# Patient Record
Sex: Female | Born: 1975
Health system: Southern US, Community
[De-identification: ages and names within clinical notes are randomized; demographics above are authoritative.]

## PROBLEM LIST (undated history)

## (undated) DIAGNOSIS — N760 Acute vaginitis: Secondary | ICD-10-CM

## (undated) DIAGNOSIS — I639 Cerebral infarction, unspecified: Secondary | ICD-10-CM

## (undated) DIAGNOSIS — G43909 Migraine, unspecified, not intractable, without status migrainosus: Secondary | ICD-10-CM

## (undated) DIAGNOSIS — F419 Anxiety disorder, unspecified: Secondary | ICD-10-CM

## (undated) DIAGNOSIS — N939 Abnormal uterine and vaginal bleeding, unspecified: Secondary | ICD-10-CM

## (undated) DIAGNOSIS — E785 Hyperlipidemia, unspecified: Secondary | ICD-10-CM

## (undated) DIAGNOSIS — F32A Depression, unspecified: Secondary | ICD-10-CM

## (undated) DIAGNOSIS — B9689 Other specified bacterial agents as the cause of diseases classified elsewhere: Secondary | ICD-10-CM

## (undated) DIAGNOSIS — D649 Anemia, unspecified: Secondary | ICD-10-CM

## (undated) DIAGNOSIS — F909 Attention-deficit hyperactivity disorder, unspecified type: Secondary | ICD-10-CM

## (undated) DIAGNOSIS — R112 Nausea with vomiting, unspecified: Secondary | ICD-10-CM

## (undated) DIAGNOSIS — N809 Endometriosis, unspecified: Secondary | ICD-10-CM

## (undated) DIAGNOSIS — E282 Polycystic ovarian syndrome: Secondary | ICD-10-CM

## (undated) DIAGNOSIS — T8859XA Other complications of anesthesia, initial encounter: Secondary | ICD-10-CM

## (undated) DIAGNOSIS — Z9889 Other specified postprocedural states: Secondary | ICD-10-CM

## (undated) HISTORY — DX: Depression, unspecified: F32.A

## (undated) HISTORY — DX: Other specified bacterial agents as the cause of diseases classified elsewhere: N76.0

## (undated) HISTORY — DX: Other specified bacterial agents as the cause of diseases classified elsewhere: B96.89

## (undated) HISTORY — DX: Migraine, unspecified, not intractable, without status migrainosus: G43.909

## (undated) HISTORY — DX: Polycystic ovarian syndrome: E28.2

## (undated) HISTORY — DX: Abnormal uterine and vaginal bleeding, unspecified: N93.9

## (undated) HISTORY — DX: Anemia, unspecified: D64.9

## (undated) HISTORY — PX: VAGINAL HYSTERECTOMY: SUR661

## (undated) HISTORY — DX: Endometriosis, unspecified: N80.9

## (undated) HISTORY — DX: Hyperlipidemia, unspecified: E78.5

## (undated) HISTORY — DX: Anxiety disorder, unspecified: F41.9

## (undated) HISTORY — DX: Cerebral infarction, unspecified: I63.9

## (undated) HISTORY — DX: Attention-deficit hyperactivity disorder, unspecified type: F90.9

---
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
## 2012-02-26 ENCOUNTER — Ambulatory Visit: Payer: Self-pay

## 2012-02-26 IMAGING — MG MMM DGT SCR NO ORDER W/CAD
1 series · 4 of 4 positions shown · non-contrast
Comparison: none

REASON FOR EXAM: SCR MAMMO BASELINE NO ORDER
COMMENTS:

[Series 6802: R CC · right · 4 of 4 slices shown]
[im 1/4]
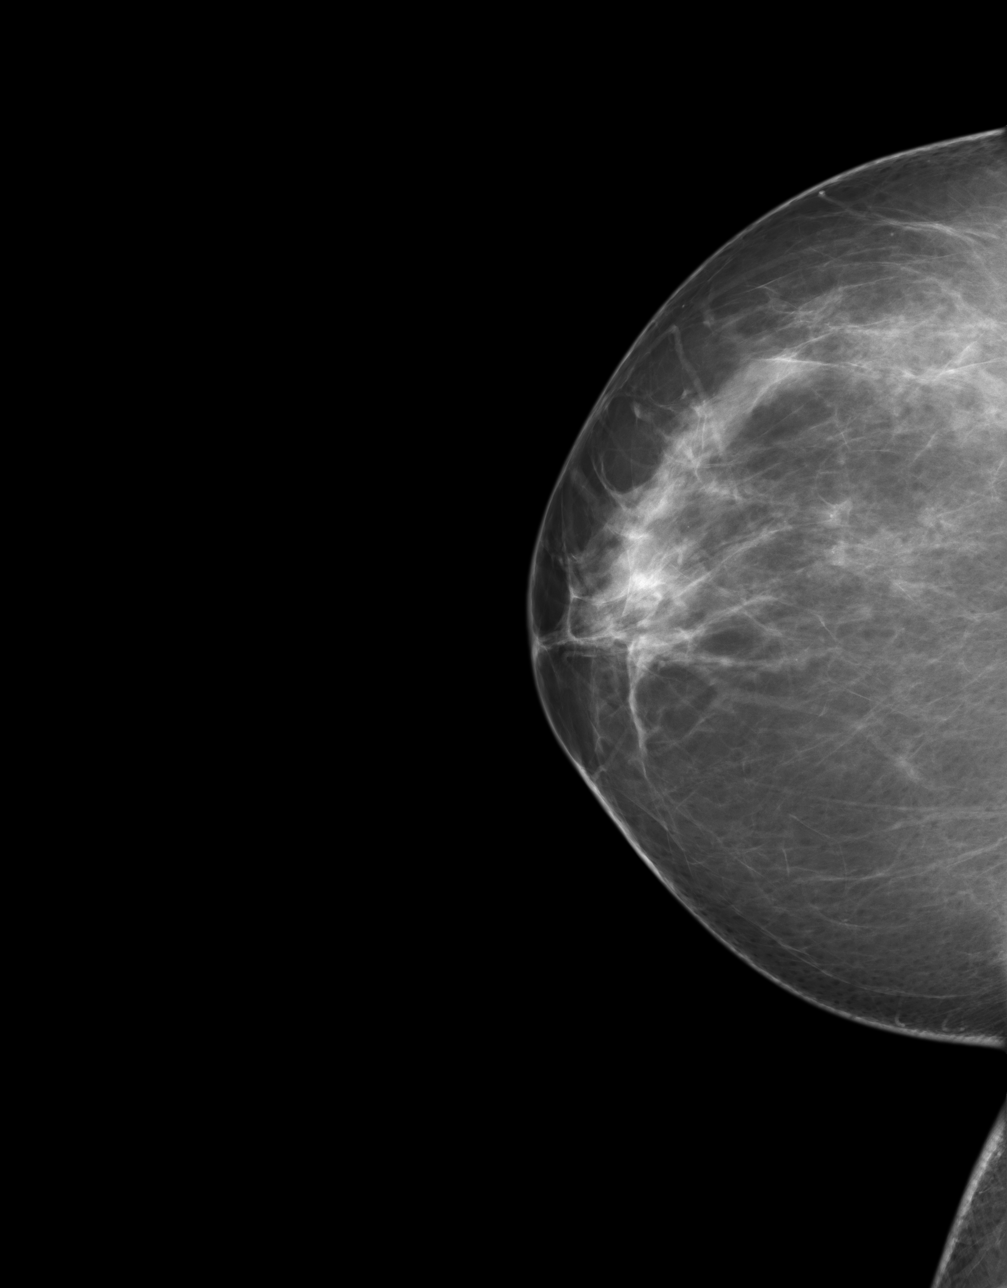
[im 2/4]
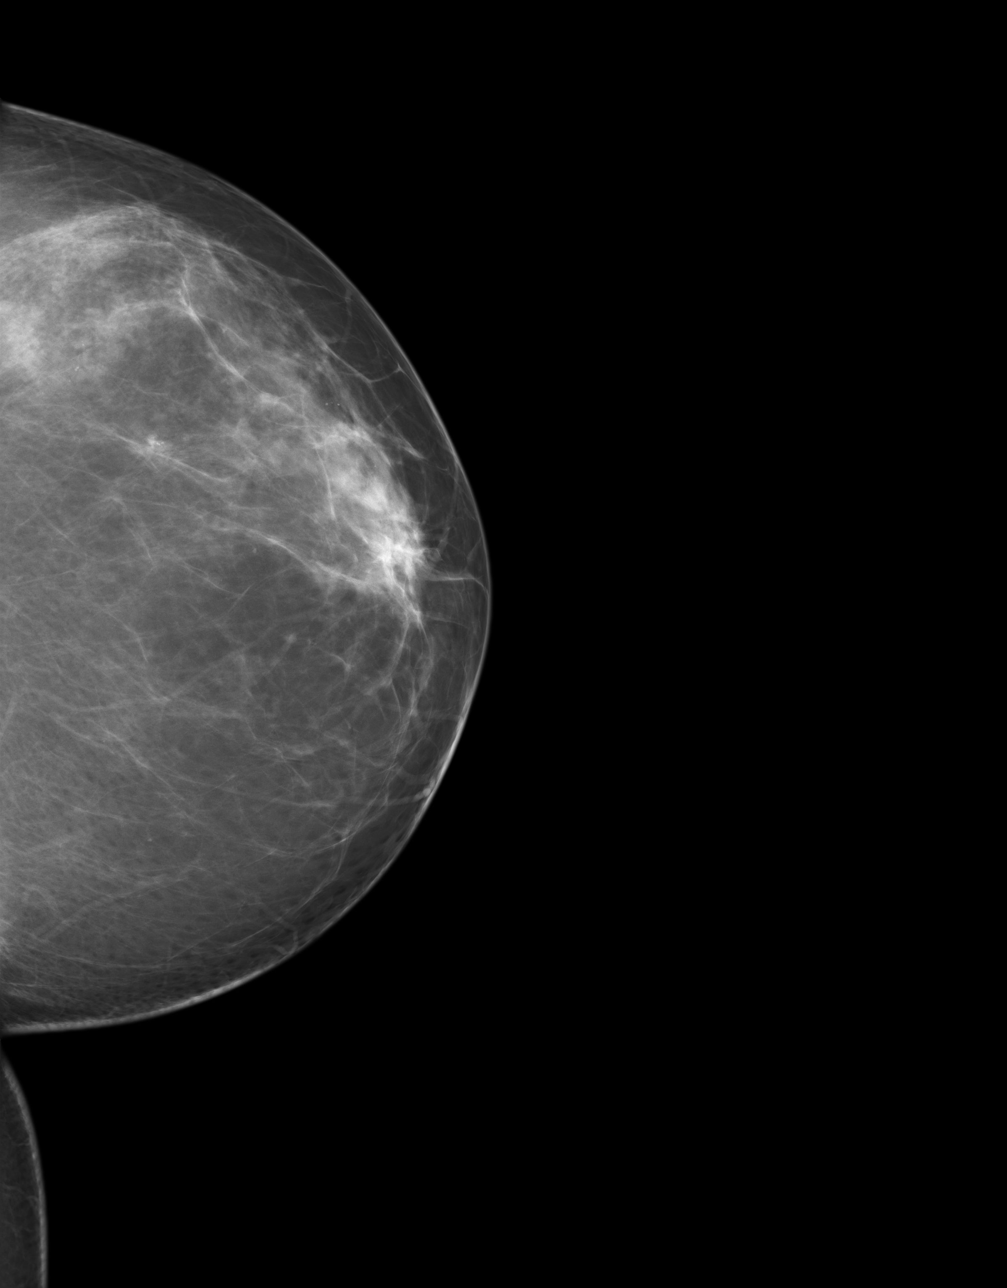
[im 3/4]
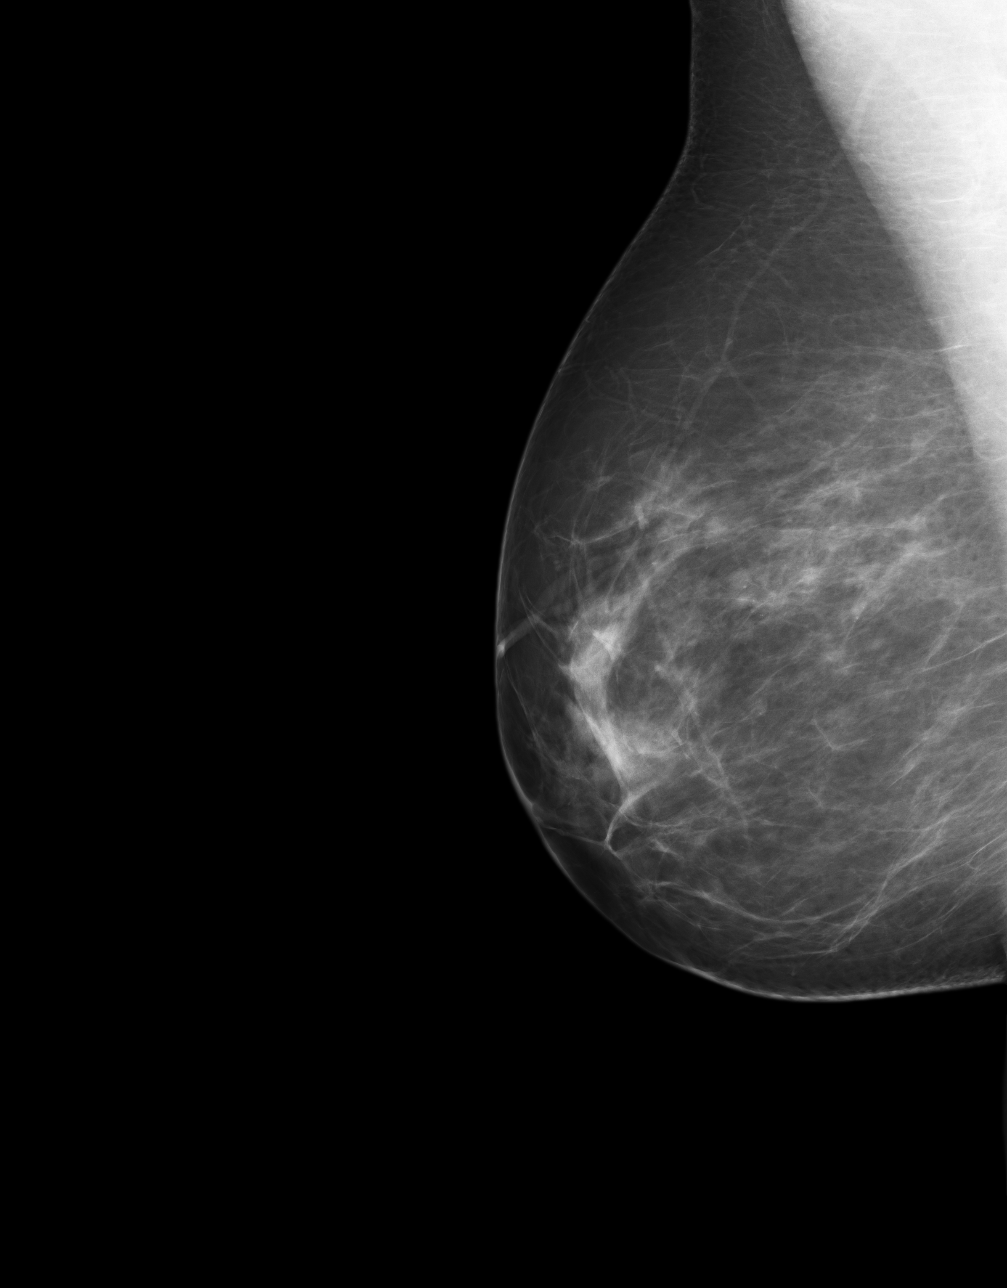
[im 4/4]
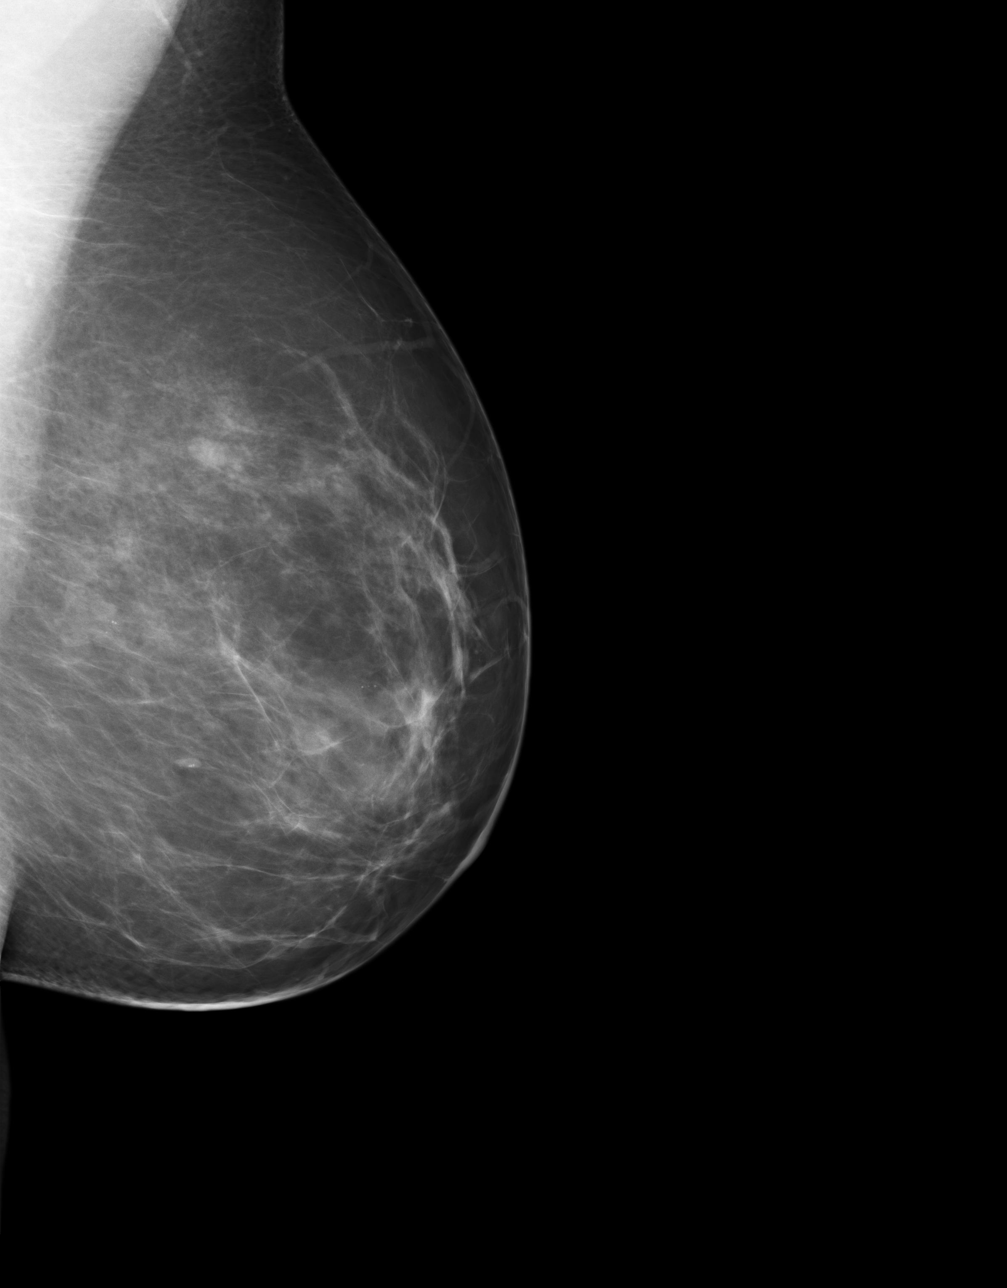

[4 of 4 positions shown; findings below may reference images not displayed]

PROCEDURE:     MMM - MMM DGT SCR NO ORDER W/CAD  - [DATE]  [DATE]

RESULT:     There are no prior mammograms for comparison. There are
scattered fibroglandular elements bilaterally. No dominant mass or malignant
appearing microcalcifications are seen. A few benign-appearing
calcifications are noted bilaterally.
IMPRESSION: 1. Bilaterally benign-appearing baseline mammogram.

BI-RADS: Category 2 - Benign Finding.

A NEGATIVE MAMMOGRAM REPORT DOES NOT PRECLUDE BIOPSY OR OTHER EVALUATION OF
A CLINICALLY PALPABLE OR OTHERWISE SUSPICIOUS MASS OR LESION. BREAST CANCER
MAY NOT BE DETECTED BY MAMMOGRAPHY IN UP TO 10% OF CASES.

## 2013-10-26 HISTORY — PX: SALPINGECTOMY: SHX328

## 2014-07-24 LAB — HM PAP SMEAR: HM PAP: NEGATIVE

## 2014-07-30 ENCOUNTER — Emergency Department: Payer: Self-pay | Admitting: Emergency Medicine

## 2014-07-30 LAB — BASIC METABOLIC PANEL
Anion Gap: 5 — ABNORMAL LOW (ref 7–16)
BUN: 9 mg/dL (ref 7–18)
Calcium, Total: 8.9 mg/dL (ref 8.5–10.1)
Chloride: 106 mmol/L (ref 98–107)
Co2: 27 mmol/L (ref 21–32)
Creatinine: 0.66 mg/dL (ref 0.60–1.30)
EGFR (Non-African Amer.): >60
Glucose: 90 mg/dL (ref 65–99)
Osmolality: 274 (ref 275–301)
POTASSIUM: 4.1 mmol/L (ref 3.5–5.1)
Sodium: 138 mmol/L (ref 136–145)

## 2014-07-30 LAB — CBC WITH DIFFERENTIAL/PLATELET
Basophil #: 0.1 10*3/uL (ref 0.0–0.1)
Basophil %: 0.9 %
Eosinophil #: 0.4 10*3/uL (ref 0.0–0.7)
Eosinophil %: 3.9 %
HCT: 29.7 % — ABNORMAL LOW (ref 35.0–47.0)
HGB: 9.1 g/dL — ABNORMAL LOW (ref 12.0–16.0)
LYMPHS PCT: 14.8 %
Lymphocyte #: 1.5 10*3/uL (ref 1.0–3.6)
MCH: 21.2 pg — ABNORMAL LOW (ref 26.0–34.0)
MCHC: 30.7 g/dL — ABNORMAL LOW (ref 32.0–36.0)
MCV: 69 fL — ABNORMAL LOW (ref 80–100)
Monocyte #: 0.6 x10 3/mm (ref 0.2–0.9)
Monocyte %: 5.4 %
NEUTROS ABS: 7.6 10*3/uL — AB (ref 1.4–6.5)
NEUTROS PCT: 75 %
Platelet: 357 10*3/uL (ref 150–440)
RBC: 4.29 10*6/uL (ref 3.80–5.20)
RDW: 18.8 % — ABNORMAL HIGH (ref 11.5–14.5)
WBC: 10.1 10*3/uL (ref 3.6–11.0)

## 2014-07-30 LAB — URINALYSIS, COMPLETE
BILIRUBIN, UR: NEGATIVE
GLUCOSE, UR: NEGATIVE mg/dL (ref 0–75)
Ketone: NEGATIVE
LEUKOCYTE ESTERASE: NEGATIVE
Nitrite: NEGATIVE
PH: 6 (ref 4.5–8.0)
Protein: NEGATIVE
RBC,UR: 37 /HPF (ref 0–5)
SPECIFIC GRAVITY: 1.002 (ref 1.003–1.030)
WBC UR: 2 /HPF (ref 0–5)

## 2014-08-31 ENCOUNTER — Emergency Department: Payer: Self-pay | Admitting: Emergency Medicine

## 2014-08-31 LAB — BASIC METABOLIC PANEL
ANION GAP: 8 (ref 7–16)
BUN: 6 mg/dL — AB (ref 7–18)
Calcium, Total: 8.4 mg/dL — ABNORMAL LOW (ref 8.5–10.1)
Chloride: 105 mmol/L (ref 98–107)
Co2: 24 mmol/L (ref 21–32)
Creatinine: 0.9 mg/dL (ref 0.60–1.30)
EGFR (African American): >60
GLUCOSE: 146 mg/dL — AB (ref 65–99)
OSMOLALITY: 274 (ref 275–301)
Potassium: 3.4 mmol/L — ABNORMAL LOW (ref 3.5–5.1)
Sodium: 137 mmol/L (ref 136–145)

## 2014-08-31 LAB — URINALYSIS, COMPLETE
Hyaline Cast: 2
RBC,UR: 8 /HPF (ref 0–5)
Specific Gravity: 1.015 (ref 1.003–1.030)

## 2014-08-31 LAB — CBC WITH DIFFERENTIAL/PLATELET
BASOS PCT: 0.5 %
Basophil #: 0 10*3/uL (ref 0.0–0.1)
Eosinophil #: 0.1 10*3/uL (ref 0.0–0.7)
Eosinophil %: 1.2 %
HCT: 37.5 % (ref 35.0–47.0)
HGB: 12.1 g/dL (ref 12.0–16.0)
LYMPHS ABS: 1.5 10*3/uL (ref 1.0–3.6)
LYMPHS PCT: 15.4 %
MCH: 27.2 pg (ref 26.0–34.0)
MCHC: 32.3 g/dL (ref 32.0–36.0)
MCV: 84 fL (ref 80–100)
Monocyte #: 0.5 x10 3/mm (ref 0.2–0.9)
Monocyte %: 5.5 %
NEUTROS PCT: 77.4 %
Neutrophil #: 7.3 10*3/uL — ABNORMAL HIGH (ref 1.4–6.5)
PLATELETS: 281 10*3/uL (ref 150–440)
RBC: 4.45 10*6/uL (ref 3.80–5.20)
RDW: 29.6 % — ABNORMAL HIGH (ref 11.5–14.5)
WBC: 9.4 10*3/uL (ref 3.6–11.0)

## 2014-08-31 LAB — D-DIMER(ARMC): D-Dimer: 178 ng/ml

## 2014-08-31 LAB — TROPONIN I
Troponin-I: <0.02 ng/mL
Troponin-I: <0.02 ng/mL

## 2014-08-31 IMAGING — CT CT ANGIO CHEST
2 of 6 series · 18 of 36 positions shown · IV contrast (APPLIED)
Comparison: None.

ADDENDUM:
There is a 2 mm nodule in the right middle lobe and 3 mm nodule in
the left lower lobe. If the patient is at high risk for bronchogenic
carcinoma, follow-up chest CT at 1 year is recommended. If the
patient is at low risk, no follow-up is needed. This recommendation
follows the consensus statement: Guidelines for Management of Small
Pulmonary Nodules Detected on CT Scans: A Statement from the
CLINICAL DATA: Weakness. Dizziness. Left-sided chest pain since
last night.

EXAM:
CT ANGIOGRAPHY CHEST WITH CONTRAST
TECHNIQUE: Multidetector CT imaging of the chest was performed using the
standard protocol during bolus administration of intravenous
contrast. Multiplanar CT image reconstructions and MIPs were
obtained to evaluate the vascular anatomy.
CONTRAST:  75 cc Isovue 370

[Series 5: pe 1.0 thins · axial · 0.68mm/px · z∈[-715,-470]mm · 17 of 277 slices shown]
[im 16/277  lung]
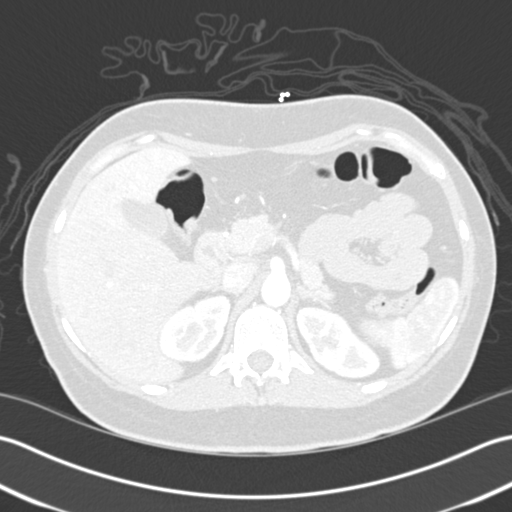
[im 31/277  mediastinal]
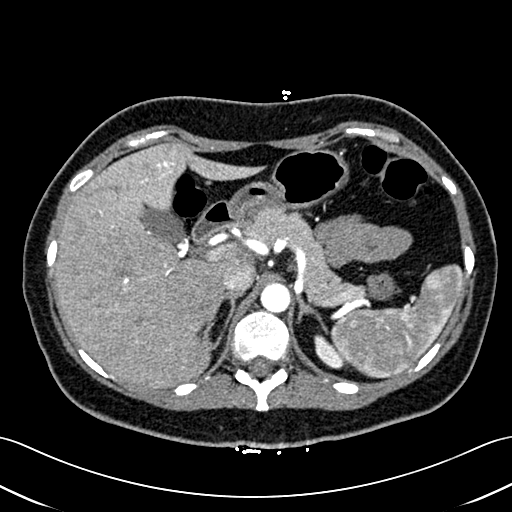
[im 47/277  lung]
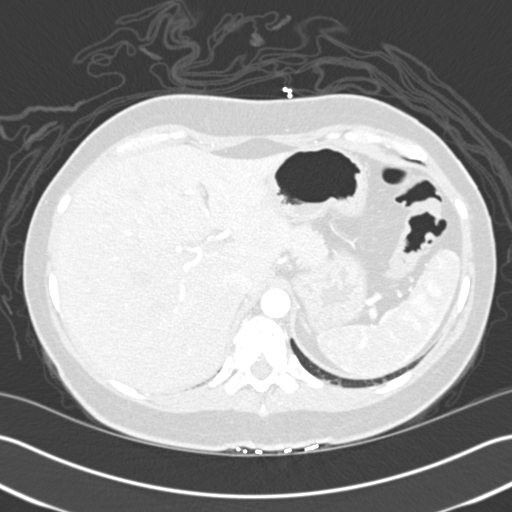
[im 62/277  mediastinal]
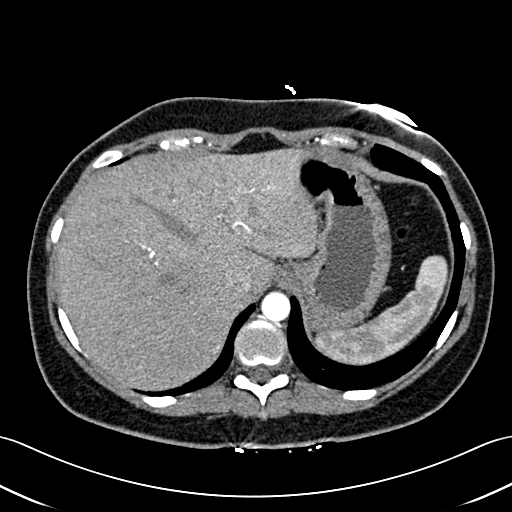
[im 77/277  lung]
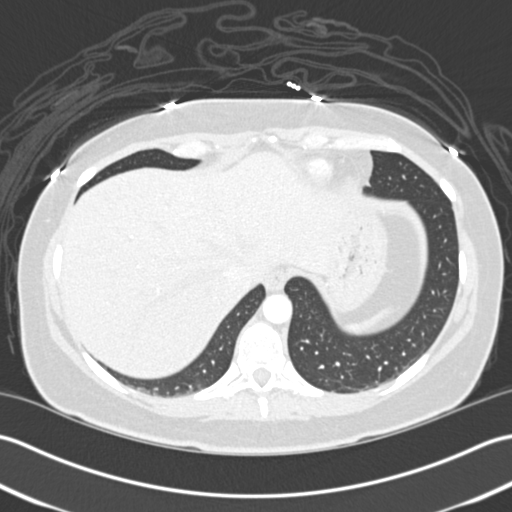
[im 93/277  mediastinal]
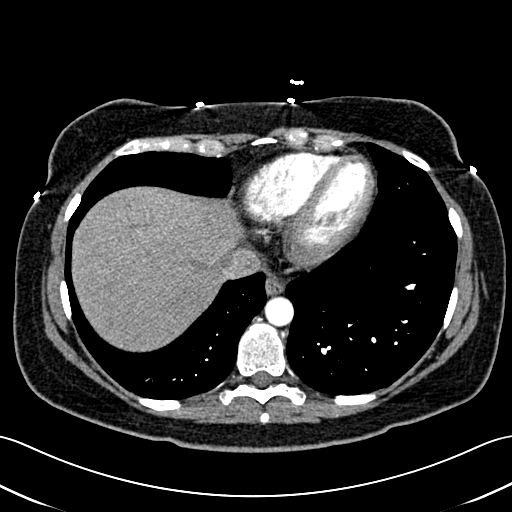
[im 108/277  lung]
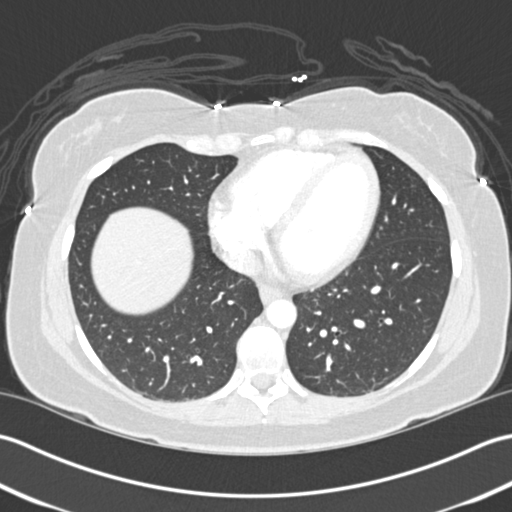
[im 123/277  mediastinal]
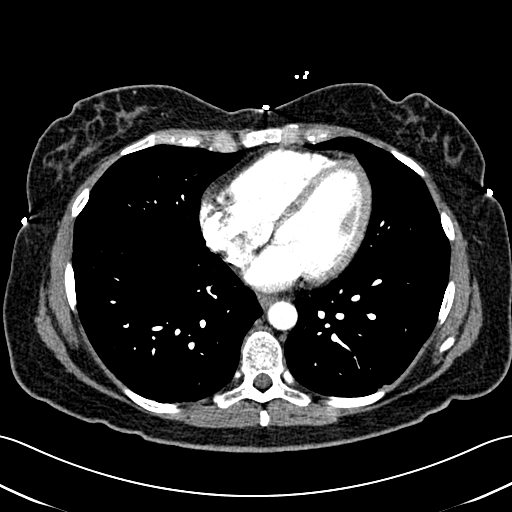
[im 139/277  lung]
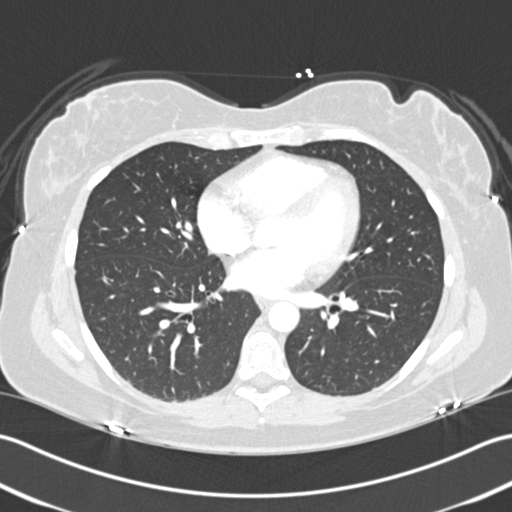
[im 154/277  mediastinal]
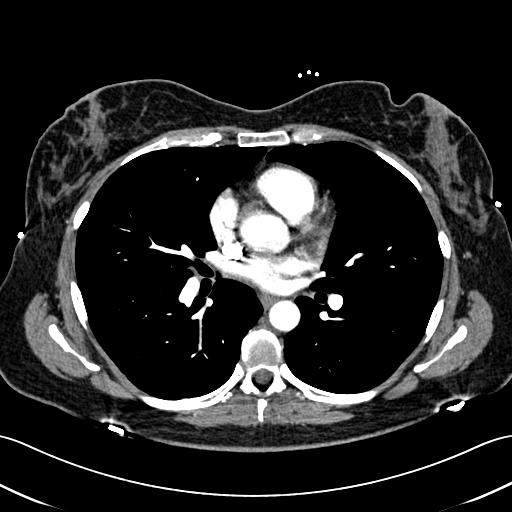
[im 169/277  lung]
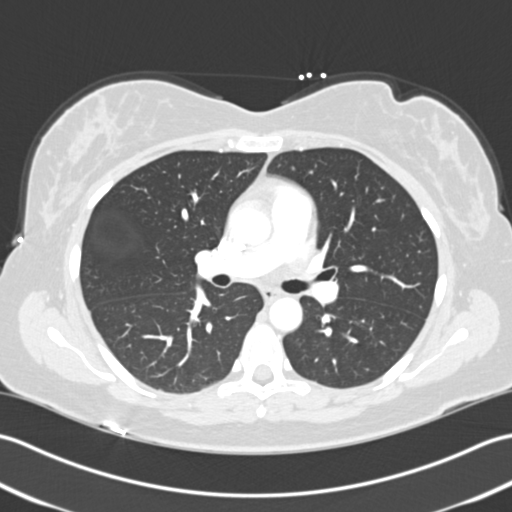
[im 185/277  mediastinal]
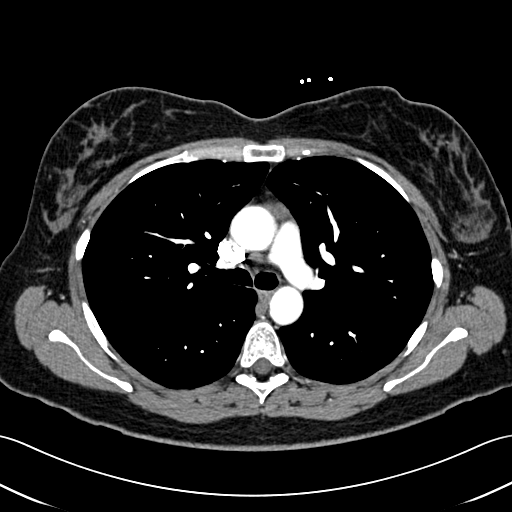
[im 200/277  lung]
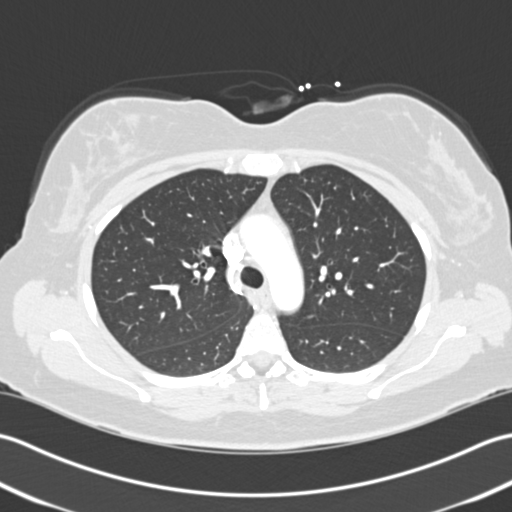
[im 215/277  mediastinal]
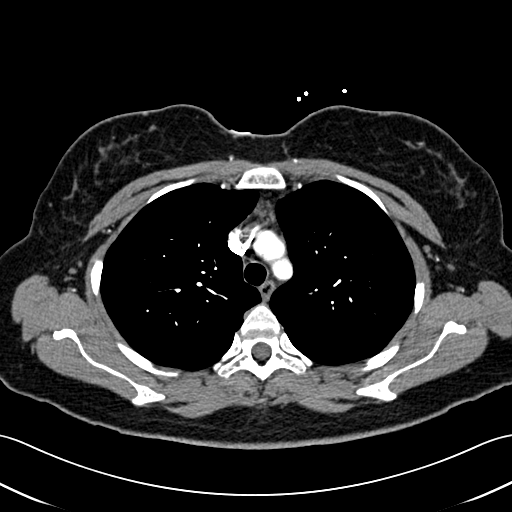
[im 231/277  lung]
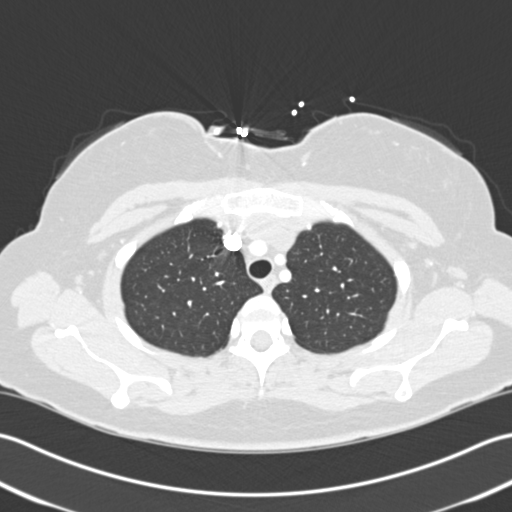
[im 246/277  mediastinal]
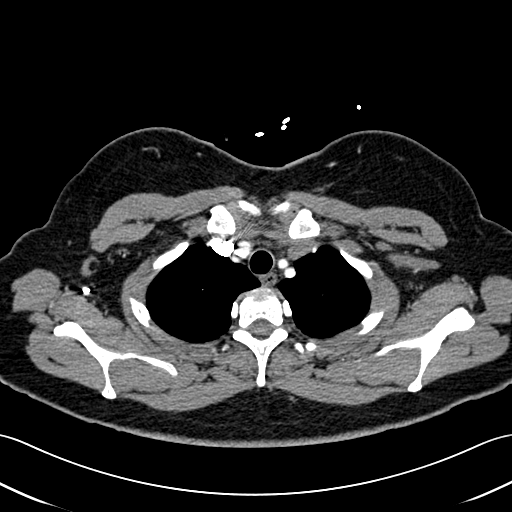
[im 261/277  lung]
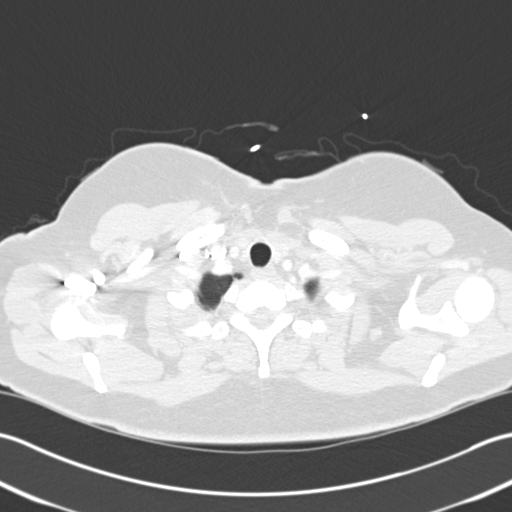

[Series 7: cor pe 2.0 mpr · coronal · 0.54mm/px · 1 of 129 slices shown]
[im 65/129  mediastinal]
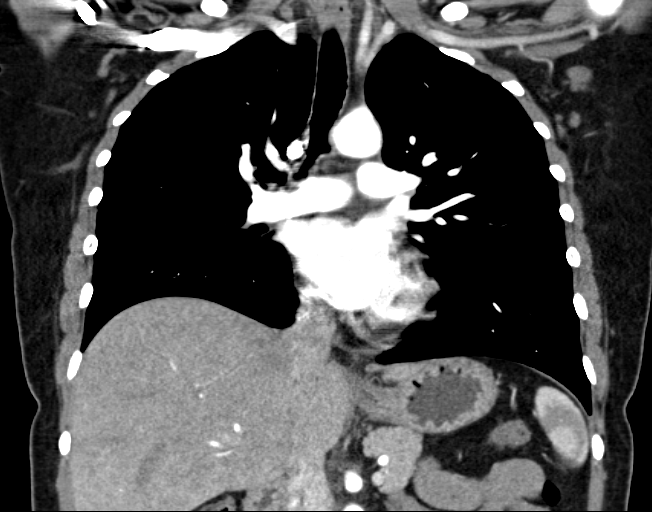

[18 of 36 positions shown; findings below may reference images not displayed]

FINDINGS: There are no filling defects in the pulmonary arterial tree to
suggest acute pulmonary thromboembolism.

No abnormal mediastinal adenopathy.

A small physiologic amount of pericardial fluid is noted.

No pneumothorax.  No pleural effusion.

2 mm right middle lobe nodule on image 64. 3 mm triangular
peripheral opacity in the left lower lobe on image 80.

No vertebral compression deformity.

Review of the MIP images confirms the above findings.
IMPRESSION: No evidence of acute pulmonary thromboembolism.

## 2014-09-04 ENCOUNTER — Ambulatory Visit: Payer: Self-pay | Admitting: Obstetrics and Gynecology

## 2014-09-10 ENCOUNTER — Ambulatory Visit: Payer: Self-pay | Admitting: Obstetrics and Gynecology

## 2014-09-11 LAB — URINALYSIS, COMPLETE
BILIRUBIN, UR: NEGATIVE
Glucose,UR: NEGATIVE mg/dL (ref 0–75)
KETONE: NEGATIVE
Nitrite: NEGATIVE
Ph: 7 (ref 4.5–8.0)
Protein: NEGATIVE
RBC,UR: 5 /HPF (ref 0–5)
Specific Gravity: 1.005 (ref 1.003–1.030)
WBC UR: 7 /HPF (ref 0–5)

## 2014-09-11 LAB — HEMOGLOBIN: HGB: 10 g/dL — ABNORMAL LOW (ref 12.0–16.0)

## 2014-09-11 LAB — CREATININE, SERUM: Creatinine: 0.78 mg/dL (ref 0.60–1.30)

## 2014-09-12 LAB — URINE CULTURE

## 2014-10-26 LAB — COMPREHENSIVE METABOLIC PANEL
ALBUMIN: 3.4 g/dL (ref 3.4–5.0)
ALK PHOS: 79 U/L
Anion Gap: 5 — ABNORMAL LOW (ref 7–16)
BUN: 2 mg/dL — ABNORMAL LOW (ref 7–18)
Bilirubin,Total: 0.3 mg/dL (ref 0.2–1.0)
CO2: 30 mmol/L (ref 21–32)
CREATININE: 0.8 mg/dL (ref 0.60–1.30)
Calcium, Total: 8.9 mg/dL (ref 8.5–10.1)
Chloride: 103 mmol/L (ref 98–107)
EGFR (African American): >60
EGFR (Non-African Amer.): >60
GLUCOSE: 123 mg/dL — AB (ref 65–99)
Osmolality: 273 (ref 275–301)
Potassium: 3.3 mmol/L — ABNORMAL LOW (ref 3.5–5.1)
SGOT(AST): 26 U/L (ref 15–37)
SGPT (ALT): 37 U/L
Sodium: 138 mmol/L (ref 136–145)
Total Protein: 7.5 g/dL (ref 6.4–8.2)

## 2014-10-26 LAB — URINALYSIS, COMPLETE
Bacteria: NONE SEEN
Bilirubin,UR: NEGATIVE
Glucose,UR: NEGATIVE mg/dL (ref 0–75)
Ketone: NEGATIVE
LEUKOCYTE ESTERASE: NEGATIVE
Nitrite: NEGATIVE
Ph: 9 (ref 4.5–8.0)
Protein: NEGATIVE
SPECIFIC GRAVITY: 1.005 (ref 1.003–1.030)

## 2014-10-26 LAB — CBC WITH DIFFERENTIAL/PLATELET
BASOS ABS: 0 10*3/uL (ref 0.0–0.1)
Basophil %: 0.5 %
EOS ABS: 0.3 10*3/uL (ref 0.0–0.7)
Eosinophil %: 3.2 %
HCT: 37.5 % (ref 35.0–47.0)
HGB: 12.3 g/dL (ref 12.0–16.0)
LYMPHS PCT: 14.8 %
Lymphocyte #: 1.2 10*3/uL (ref 1.0–3.6)
MCH: 29.1 pg (ref 26.0–34.0)
MCHC: 32.9 g/dL (ref 32.0–36.0)
MCV: 89 fL (ref 80–100)
MONO ABS: 0.8 x10 3/mm (ref 0.2–0.9)
Monocyte %: 9.3 %
Neutrophil #: 6 10*3/uL (ref 1.4–6.5)
Neutrophil %: 72.2 %
PLATELETS: 254 10*3/uL (ref 150–440)
RBC: 4.24 10*6/uL (ref 3.80–5.20)
RDW: 14.7 % — ABNORMAL HIGH (ref 11.5–14.5)
WBC: 8.3 10*3/uL (ref 3.6–11.0)

## 2014-10-26 LAB — WET PREP, GENITAL

## 2014-10-26 LAB — LIPASE, BLOOD: Lipase: 76 U/L (ref 73–393)

## 2014-10-26 IMAGING — CT CT ABD-PELV W/ CM
2 of 5 series · 15 of 46 positions shown, 17 images · IV contrast (omnipaque)
Comparison: None.

CLINICAL DATA: Abdominal pain and distention. Patient sought OBGYN
doctor on [REDACTED] and was told she had a tear in the vaginal area.
History ectomy 6 months ago. Patient appears pale and says she feels
like she is going to pass out. Fever.

EXAM:
CT ABDOMEN AND PELVIS WITH CONTRAST
TECHNIQUE: Multidetector CT imaging of the abdomen and pelvis was performed
using the standard protocol following bolus administration of
intravenous contrast.
CONTRAST:  100 mL Omnipaque 350

[Series 2: routine abd pel with · axial · 0.67mm/px · z∈[-1062,-606]mm · 12 of 105 slices shown, 14 images]
[im 7/105  soft-tissue]
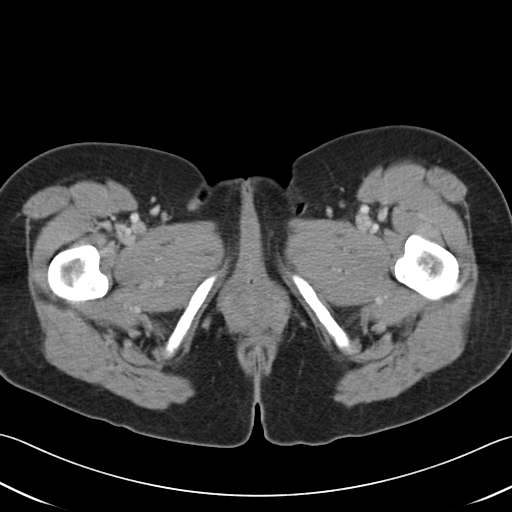
[im 7/105  bone]
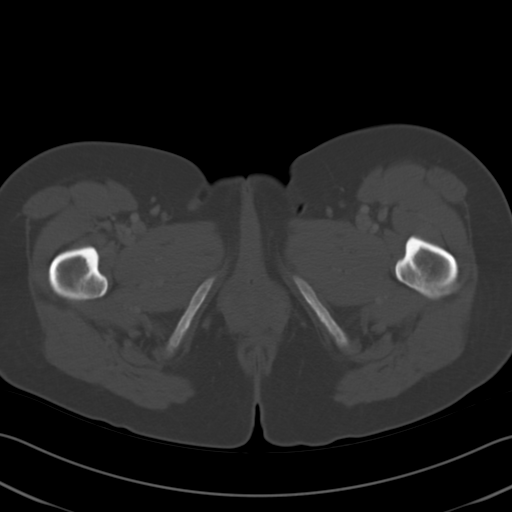
[im 14/105  soft-tissue]
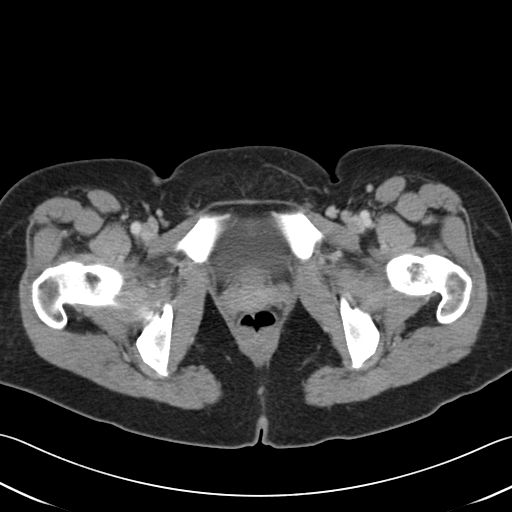
[im 21/105  soft-tissue]
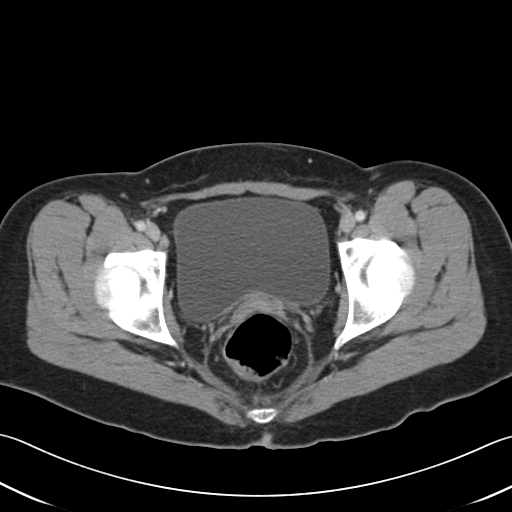
[im 35/105  soft-tissue]
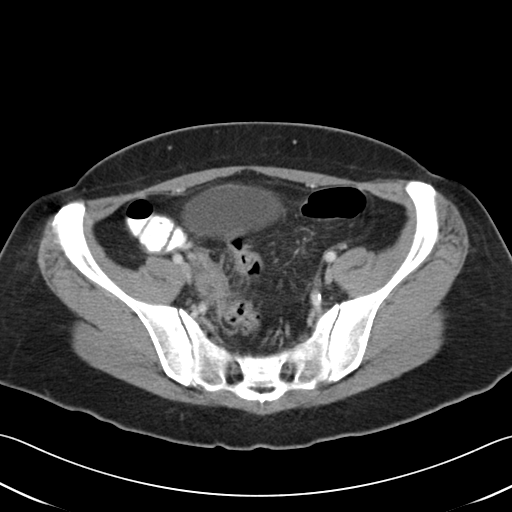
[im 42/105  soft-tissue]
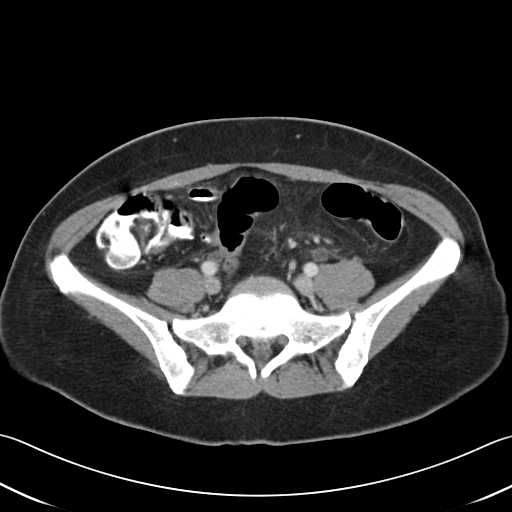
[im 49/105  soft-tissue]
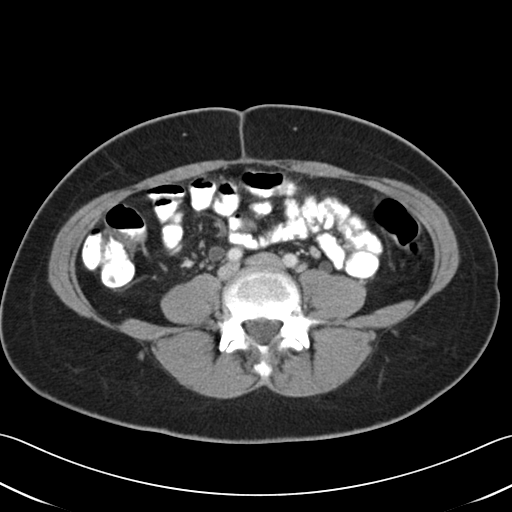
[im 56/105  soft-tissue]
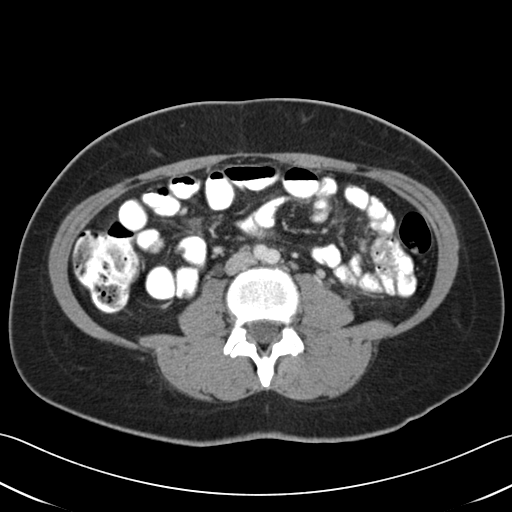
[im 63/105  soft-tissue]
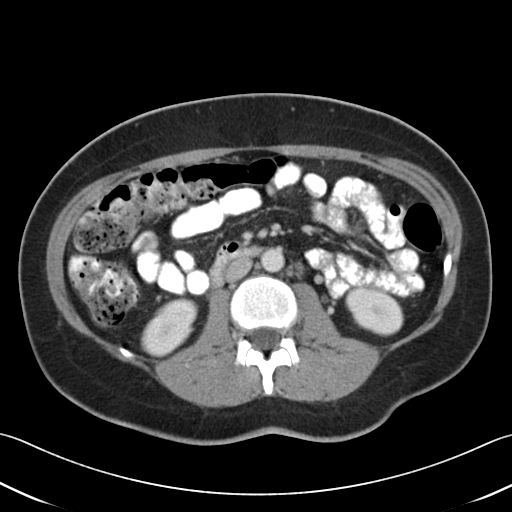
[im 70/105  soft-tissue]
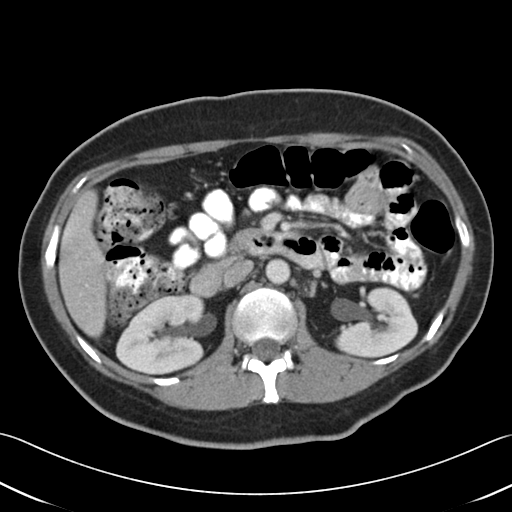
[im 70/105  bone]
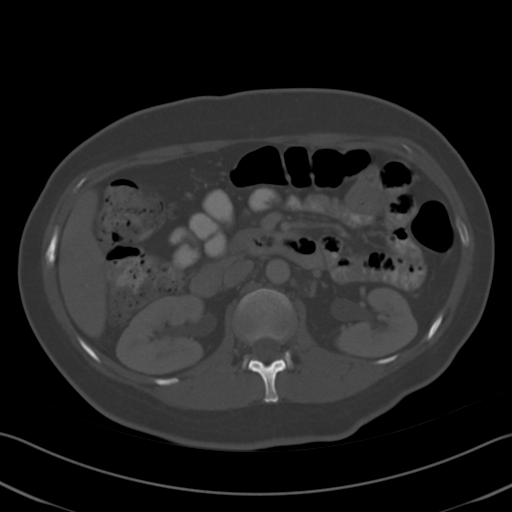
[im 84/105  soft-tissue]
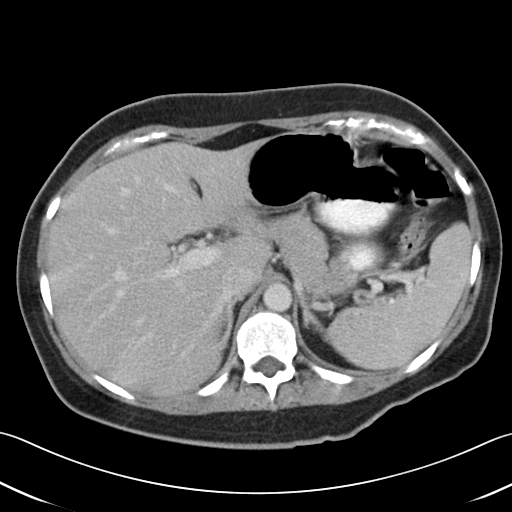
[im 91/105  soft-tissue]
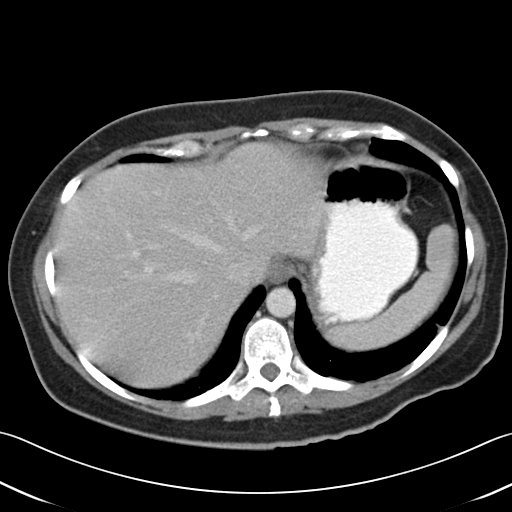
[im 98/105  soft-tissue]
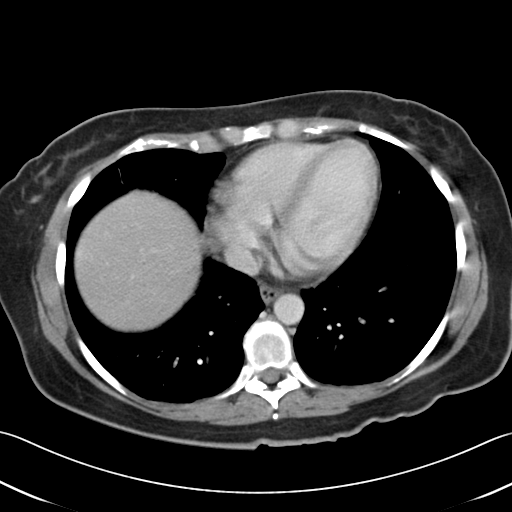

[Series 5: cor routine abd pel with · coronal · 0.66mm/px · 3 of 119 slices shown]
[im 40/119  soft-tissue]
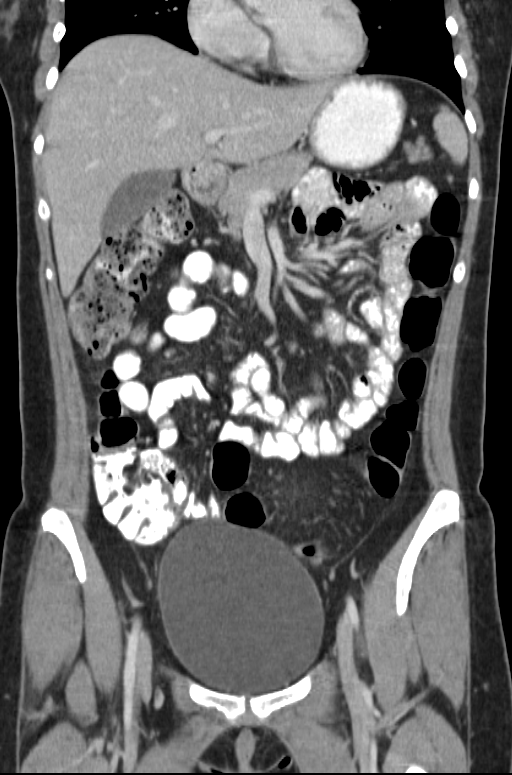
[im 53/119  soft-tissue]
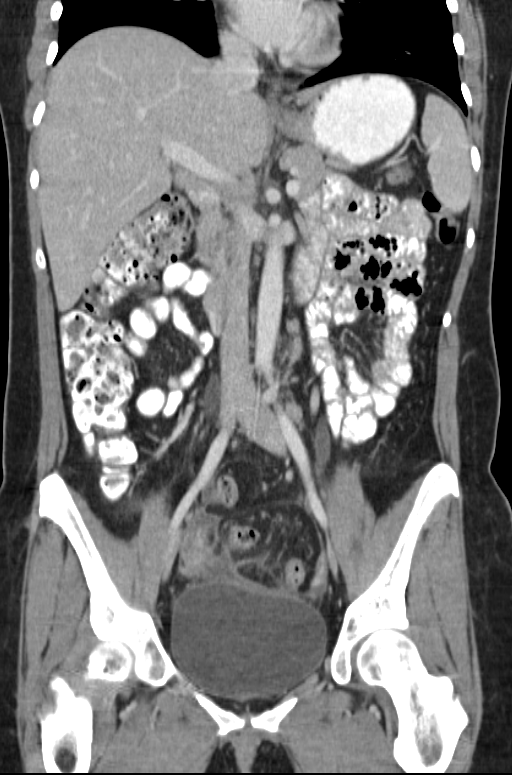
[im 66/119  soft-tissue]
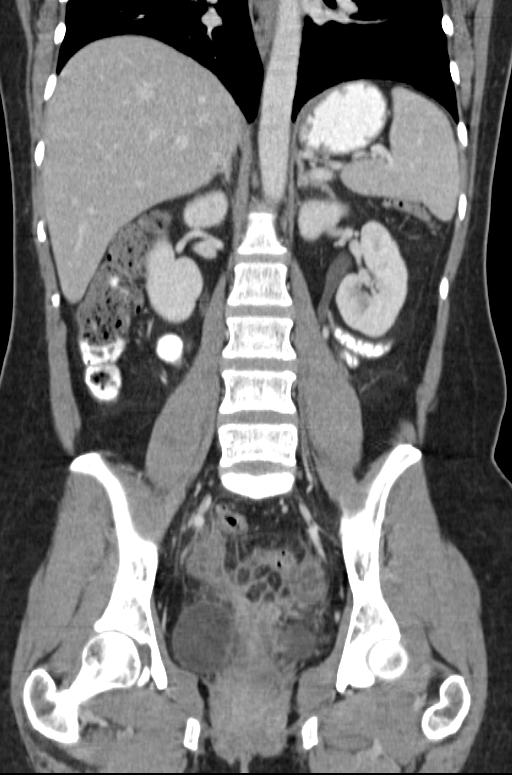

[15 of 46 positions shown; findings below may reference images not displayed]

FINDINGS: Atelectasis in the lung bases.

Mild diffuse fatty infiltration in the liver. The gallbladder,
pancreas, spleen, adrenal glands, abdominal aorta, inferior vena
cava, and retroperitoneal lymph nodes are unremarkable.
Subcentimeter cysts in the right kidney. No hydronephrosis in either
kidney. Mild dilatation of the mid ureters is probably physiologic.
Stomach, small bowel, and colon appear unremarkable. No free air or
free fluid in the abdomen. Abdominal wall musculature appears
intact.

Pelvis: Surgical absence of the uterus. Ovaries are not abnormally
distended. There is infiltration throughout the pelvic fat with tiny
gas collections and small loculated appearing fluid collection in
the deep pelvis measuring about 2.5 cm diameter. This likely
represents a small abscess. Postoperative change felt less likely
due to 6 week interval. Mild associated thickening of the sigmoid
colon wall suggest inflammatory reaction. Bladder wall is not
thickened. No abnormal pelvic lymphadenopathy. Appendix is normal.
No destructive bone lesions.
IMPRESSION: Soft tissue infiltration and a small gas collections in the pelvis
with small loculated fluid collections suggesting abscess.

## 2014-10-27 ENCOUNTER — Inpatient Hospital Stay: Payer: Self-pay | Admitting: Obstetrics and Gynecology

## 2014-10-27 LAB — GC/CHLAMYDIA PROBE AMP

## 2014-10-28 LAB — CBC WITH DIFFERENTIAL/PLATELET
BASOS PCT: 0.9 %
Basophil #: 0.1 10*3/uL (ref 0.0–0.1)
Eosinophil #: 0.2 10*3/uL (ref 0.0–0.7)
Eosinophil %: 2.7 %
HCT: 36.7 % (ref 35.0–47.0)
HGB: 11.7 g/dL — AB (ref 12.0–16.0)
LYMPHS ABS: 1.5 10*3/uL (ref 1.0–3.6)
Lymphocyte %: 21.4 %
MCH: 28.4 pg (ref 26.0–34.0)
MCHC: 31.9 g/dL — AB (ref 32.0–36.0)
MCV: 89 fL (ref 80–100)
MONO ABS: 0.7 x10 3/mm (ref 0.2–0.9)
Monocyte %: 10.7 %
NEUTROS PCT: 64.3 %
Neutrophil #: 4.4 10*3/uL (ref 1.4–6.5)
Platelet: 236 10*3/uL (ref 150–440)
RBC: 4.12 10*6/uL (ref 3.80–5.20)
RDW: 14.5 % (ref 11.5–14.5)
WBC: 6.8 10*3/uL (ref 3.6–11.0)

## 2014-10-31 LAB — CULTURE, BLOOD (SINGLE)

## 2015-02-16 NOTE — Op Note (Signed)
PATIENT NAME:  Katelyn Hobbs, Katelyn Hobbs MR#:  [id number] DATE OF BIRTH:  Nov 18, 1975  DATE OF PROCEDURE:  09/10/2014  PREOPERATIVE DIAGNOSES:  1.  Menorrhagia.  2.  Endometriosis.  3.  Pelvic pain.  POSTOPERATIVE DIAGNOSES: 1.  Menorrhagia.  2.  Endometriosis.  3.  Pelvic pain. 4.  Pelvic adhesions.   OPERATION: Laparoscopic-assisted vaginal hysterectomy with bilateral salpingectomy.   ANESTHESIA: General.   SURGEON: Rubie Maid, MD   ASSISTANT: Malachi Paradise, MD   ESTIMATED BLOOD LOSS: 300 mL.  OPERATIVE FLUIDS: 2200 mL.   URINE OUTPUT: 600 mL.   COMPLICATIONS: None.   FINDINGS: Uterus was sounded to 9 cm, mobile. Uterus was noted to be bulky. On laparoscopic examination, there were endometrial implants on the anterior abdominal wall near the bladder as well as in the posterior cul-de-sac. The fallopian tubes and ovaries that were encased in filmy adhesions. There was also a dense adhesion band in the lower uterine segment along the left lateral edge to the bladder. There was bilateral peristalsis of the ureters noted before and at the conclusion of the case.   SPECIMEN: Uterus, right tube and left tube.  CONDITION: Stable.   PROCEDURE DETAILS: The patient was taken to the operating room where she was placed under general anesthesia without difficulty. She was then placed in the dorsal lithotomy position and prepped and draped in normal sterile fashion using Allen stirrups. Next, a Foley catheter was placed. After this, a speculum was placed into the vagina. A single-tooth tenaculum was utilized to grasp the anterior lip of the uterine cervix. The uterus was sounded to 9 cm. A Hulka device was inserted for uterine manipulation. The single-tooth tenaculum and speculum were removed from the vagina. At this time, the infraumbilical area was then incised making a vertical incision approximately 5 mm, and the laparoscope was inserted into the 5 mm trocar with sheath and inserted into  the abdominal cavity under direct visualization. After entrance into the abdominal cavity had been confirmed, CO2 gas was allowed to insufflate the abdomen. Through the laparoscope, adequate visualization of pelvic structures was noted. Survey of the abdomen was performed with the above findings. There was a normal upper abdomen on survey. Next, a 5 mm skin incision was made on the left lateral abdominal wall under direct visualization, careful to avoid any vasculature, and a 5 mm trocar was introduced into the abdominal cavity for instrumentation. The same procedure was performed on the right abdominal sidewall. At this time, the right cornu was grasped and the right fallopian tube was transected and coagulated using the Harmonic device. There were filmy adhesions of the fallopian tube to the ovary that were lysed during this removal. The right fallopian tube was then removed through the right 5 mm trocar site.   Attention was then turned to the left fallopian tube, which in a similar fashion was grasped at the cornu and then transected and the remaining left fallopian tube was transected using serial bites with the Harmonic scalpel. Again, also using the Harmonic device to separate any filmy adhesions noted of the fallopian tube to the ovary. The left fallopian tube was attempted to be passed through the left 5 mm trocar; however, the tube was noted to be too large to pass so it was placed down in the cul-de-sac region to be removed vaginally. After this, the utero-ovarian ligaments and round ligaments bilaterally were doubly coagulated with the Harmonic device and transected without difficulty. The uterine artery was then identified on both sides  and was doubly coagulated using bipolar electrocautery and transected. Next, the anterior leaf of the broad ligament was then dissected to the midline bilaterally establishing a bladder flap with a combination of blunt and sharp dissection. The remainder of the  cardinal ligaments, uterine vessels and posterior sheaths of the broad ligament were also coagulated and transected.   At this time, attention was made to the vaginal hysterectomy. The laparoscope was removed and attention was turned to the vaginal region. The Hulka clamp was removed and the anterior and posterior leaves of the cervix were grasped using a double-tooth tenaculum. A circumferential injection with Pitressin at 20 mL and 30 mL of normal saline. This circumferential injection was made at the cervical vaginal junction. A circumferential incision was then made at this junction using the Bovie. The posterior colpotomy was accomplished by using sharp dissection without difficulty. The right uterosacral ligament was clamped, transected, and ligated with 0 Vicryl suture. The left uterosacral ligament was then clamped, transected, and ligated with 0 Vicryl suture. The parametrial tissue was then clamped bilaterally, transected, and ligated with 0 Vicryl suture bilaterally. The anterior colpotomy was then made without difficulty and the remainder of the parametrial tissue was then clamped bilaterally, transected, and ligated with 0 Vicryl. The uterus was then removed and passed off the operative field. A sponge stick was placed into the vagina and the pedicles were evaluated. There was an area of bleeding noted on the right pedicle. This was clamped using a right angle clamp and ligated with a 0 Vicryl suture. After this, hemostasis was noted throughout. The vaginal cuff was then closed using interrupted sutures of 0 Vicryl. Crossing the uterosacral ligaments in the midline, hemostasis was again noted throughout. At this time, attention was then turned back to the abdomen where the laparoscope was reinserted to the abdomen. The abdomen was reinsufflated. Evaluation revealed no further bleeding. The ureters were again identified bilaterally with peristalsis noted. The lateral trocars were then removed from the  abdomen under laparoscopic visualization. The laparoscope was removed and the carbon dioxide was allowed to escape from the abdomen and the infraumbilical trocar sheath was then removed. The skin incisions were closed using Dermabond. The patient was then awakened and taken to the recovery room in satisfactory condition. Instrument, sponge, and needle counts were correct x 2 at the end of the procedure.    ____________________________ Chesley Noon. Marcelline Mates, MD asc:TT D: 09/10/2014 21:50:01 ET T: 09/10/2014 22:27:00 ET JOB#: [id number]  cc: Chesley Noon. Marcelline Mates, MD, <Dictator> Augusto Gamble MD ELECTRONICALLY SIGNED 09/17/2014 9:59

## 2015-02-18 LAB — SURGICAL PATHOLOGY

## 2015-02-20 NOTE — H&P (Signed)
PATIENT NAME:  Katelyn Hobbs, Katelyn Hobbs MR#:  [id number] DATE OF BIRTH:  03-17-1976  DATE OF ADMISSION:  10/26/2014  CHIEF COMPLAINT:  1.  Pelvic pain.  2.  Fevers.  3.  Six weeks status post LAVH, bilateral salpingectomy.   HISTORY: The patient is a 39 year old white female, status post LAVH and bilateral salpingectomy on 09/10/2014, presents now for evaluation of a several day history of increasing lower abdominal pelvic pain with associated nausea and vomiting, and fever to 103 degrees Fahrenheit approximately 24 hours ago. The patient was seen recently in the clinic 2 days prior to admission because of some vaginal spotting. At that time, a small 1 cm cuff dehiscence was noted. This was managed conservatively with the patient being placed on Flagyl and doxycycline. However, because of her increasing pain, as well as nausea and vomiting with associated fever, she is now admitted for admission.   Evaluation in the Emergency Room is notable for a normal white blood cell count of 9.3. Urinalysis is notable for 1+ blood, otherwise normal. CT scan of the pelvis was suspicious for a possible 2 cm area in the pelvis possibly consistent with abscess. Clinical exam was notable for a tender pelvic cuff.   PAST MEDICAL HISTORY: Endometriosis, anxiety/depression.   PAST SURGICAL HISTORY: LAVH, bilateral salpingectomy.   FAMILY HISTORY: Noncontributory.   SOCIAL HISTORY: The patient does not smoke, does not drink, does not use drugs.   CURRENT MEDICATIONS: Citalopram, Percocet, Flagyl, and doxycycline.   PHYSICAL EXAMINATION:  VITAL SIGNS: Height 64 inches, weight 145 pounds, body mass index 24.9. Vital signs: Stable. GENERAL: The patient is a pleasant, well-appearing white female in no acute distress. She is alert and oriented.  NECK: Supple.  LUNGS: Clear.  HEART: Regular rate and rhythm.  ABDOMEN: Soft, nontender. No peritoneal signs. No organomegaly. Nondistended.  PELVIC: External genitalia normal.  BUS normal. Vagina with good estrogen effect. There is good vault support. Cervix is surgically absent. No significant secretions or discharge are noted. The uterus is surgically absent. Bimanual exam reveals some induration at the cuff without evidence of mass.  RECTOVAGINAL: Notable for a tender cuff and no rectal masses.  EXTREMITIES: Warm and dry.  SKIN: Without rash.  LYMPH NODE: Survey is negative.   IMPRESSION:  1.  Pelvic pain with fever, nausea and vomiting.  2.  Six weeks status post bilateral laparoscopically-assisted vaginal hysterectomy and salpingectomy, with CT findings suspicious for small abscess.   PLAN:  1.  Admit for IV fluids, IV antibiotics, and IV pain control.  2.  The patient will be placed on Rocephin 1 gram q. 12 hours, Flagyl 500 mg IV q. 8 hours, and Dilaudid 0.5 mg IV q. 2-3 hours p.r.n. pain.  3.  She will be placed on a regular diet as tolerated.  4.  Repeat CBC will be performed in the morning to assess the patient's clinical course.    ____________________________ Alanda Slim. Joud Ingwersen, MD mad:ts D: 10/27/2014 00:20:00 ET T: 10/27/2014 00:51:14 ET JOB#: [id number]  cc: Hassell Done A. Julietta Batterman, MD, <Dictator> [facility]

## 2015-02-24 NOTE — H&P (Signed)
PATIENT NAME:  Katelyn Hobbs, BLOMQUIST MR#:  [id number] DATE OF BIRTH:  1975/12/28  DATE OF ADMISSION:  10/27/2014  CHIEF COMPLAINT:  1.  Pelvic pain.  2.  Fevers.  3.  Six weeks status post LAVH, bilateral salpingectomy.   HISTORY: The patient is a 39 year old white female, status post LAVH and bilateral salpingectomy on 09/10/2014, presents now for evaluation of a several day history of increasing lower abdominal pelvic pain with associated nausea and vomiting, and fever to 103 degrees Fahrenheit approximately 24 hours ago. The patient was seen recently in the clinic 2 days prior to admission because of some vaginal spotting. At that time, a small 1 cm cuff dehiscence was noted. This was managed conservatively with the patient being placed on Flagyl and doxycycline. However, because of her increasing pain, as well as nausea and vomiting with associated fever, she is now admitted for admission.   Evaluation in the Emergency Room is notable for a normal white blood cell count of 9.3. Urinalysis is notable for 1+ blood, otherwise normal. CT scan of the pelvis was suspicious for a possible 2 cm area in the pelvis possibly consistent with abscess. Clinical exam was notable for a tender pelvic cuff.   PAST MEDICAL HISTORY: Endometriosis, anxiety/depression.   PAST SURGICAL HISTORY: LAVH, bilateral salpingectomy.   FAMILY HISTORY: Noncontributory.   SOCIAL HISTORY: The patient does not smoke, does not drink, does not use drugs.   CURRENT MEDICATIONS: Citalopram, Percocet, Flagyl, and doxycycline.   PHYSICAL EXAMINATION:  VITAL SIGNS: Height 64 inches, weight 145 pounds, body mass index 24.9. Vital signs: Stable. GENERAL: The patient is a pleasant, well-appearing white female in no acute distress. She is alert and oriented.  NECK: Supple.  LUNGS: Clear.  HEART: Regular rate and rhythm.  ABDOMEN: Soft, nontender. No peritoneal signs. No organomegaly. Nondistended.  PELVIC: External genitalia normal.  BUS normal. Vagina with good estrogen effect. There is good vault support. Cervix is surgically absent. No significant secretions or discharge are noted. The uterus is surgically absent. Bimanual exam reveals some induration at the cuff without evidence of mass.  RECTOVAGINAL: Notable for a tender cuff and no rectal masses.  EXTREMITIES: Warm and dry.  SKIN: Without rash.  LYMPH NODE: Survey is negative.   IMPRESSION:  1. Pelvic pain with fever, nausea and vomiting.  2. Six weeks status post bilateral laparoscopically-assisted vaginal hysterectomy and salpingectomy, with CT findings suspicious for small abscess.   PLAN:  1. Admit for IV fluids, IV antibiotics, and IV pain control.  2. The patient will be placed on Rocephin 1 gram q. 12 hours, Flagyl 500 mg IV q. 8 hours, and Dilaudid 0.5 mg IV q. 2-3 hours p.r.n. pain.  3. She will be placed on a regular diet as tolerated.  4. Repeat CBC will be performed in the morning to assess the patient's clinical course.    ____________________________ Alanda Slim. DeFrancesco, MD mad:ts D: 10/27/2014 00:20:00 ET T: 10/27/2014 00:51:14 ET JOB#: [id number]  cc: [facility] Hassell Done A. DeFrancesco, MD, <Dictator>   Alanda Slim DEFRANCESCO MD ELECTRONICALLY SIGNED 10/29/2014 11:22

## 2015-06-04 ENCOUNTER — Other Ambulatory Visit: Payer: Self-pay | Admitting: Obstetrics and Gynecology

## 2015-06-10 ENCOUNTER — Other Ambulatory Visit: Payer: Self-pay

## 2015-06-10 MED ORDER — CITALOPRAM HYDROBROMIDE 20 MG PO TABS: 20.0000 mg | ORAL_TABLET | Freq: Every day | ORAL | Status: DC

## 2015-08-09 ENCOUNTER — Telehealth: Payer: Self-pay | Admitting: Obstetrics and Gynecology

## 2015-08-09 NOTE — Telephone Encounter (Signed)
Pt needs xanax refilled but needs to talk to you about it first.

## 2015-08-12 NOTE — Telephone Encounter (Signed)
Pt needs refill on her xanax states she is having to take 2 at night

## 2015-08-13 ENCOUNTER — Other Ambulatory Visit: Payer: Self-pay | Admitting: Obstetrics and Gynecology

## 2015-08-13 MED ORDER — ALPRAZOLAM 1 MG PO TABS: 1.0000 mg | ORAL_TABLET | Freq: Two times a day (BID) | ORAL | Status: DC | PRN

## 2015-08-13 NOTE — Telephone Encounter (Signed)
Please let her know i sent it in, but to not the dose change, I went ahead and sent in a 1mg  tablet since she is taking 2 tablets of 0.5mg  to help with cost

## 2015-08-13 NOTE — Telephone Encounter (Signed)
Left detailed message about rx faxed to pharmacy

## 2015-08-14 ENCOUNTER — Ambulatory Visit: Payer: Self-pay | Admitting: Primary Care

## 2015-08-14 ENCOUNTER — Telehealth: Payer: Self-pay | Admitting: Primary Care

## 2015-08-14 NOTE — Telephone Encounter (Signed)
Noted. Very exciting news!

## 2015-08-14 NOTE — Telephone Encounter (Signed)
Pt called and apologized to cancel today's visit, she got a call saying her grandbaby was being born today. She will call back to reschedule appt.

## 2015-08-27 ENCOUNTER — Ambulatory Visit: Payer: Self-pay | Admitting: Primary Care

## 2015-09-09 ENCOUNTER — Encounter: Payer: Self-pay | Admitting: *Deleted

## 2015-09-10 ENCOUNTER — Ambulatory Visit (INDEPENDENT_AMBULATORY_CARE_PROVIDER_SITE_OTHER): Payer: Managed Care, Other (non HMO) | Admitting: Family Medicine

## 2015-09-10 ENCOUNTER — Encounter: Payer: Self-pay | Admitting: Family Medicine

## 2015-09-10 VITALS — BP 120/78 | HR 64 | Ht 65.0 in | Wt 158.0 lb

## 2015-09-10 DIAGNOSIS — M25551 Pain in right hip: Secondary | ICD-10-CM

## 2015-09-10 MED ORDER — TRAMADOL HCL 50 MG PO TABS: 50.0000 mg | ORAL_TABLET | Freq: Three times a day (TID) | ORAL | Status: DC | PRN

## 2015-09-10 MED ORDER — MELOXICAM 15 MG PO TABS: 15.0000 mg | ORAL_TABLET | Freq: Every day | ORAL | Status: DC

## 2015-09-10 NOTE — Progress Notes (Signed)
Name: Katelyn Hobbs   MRN: [id number]    DOB: 1976/06/24   Date:09/10/2015       Progress Note  Subjective  Chief Complaint  Chief Complaint  Patient presents with  . Hip Pain    R) hip pain- radiating to groin- feels fine in the mornings and by the end of the day it's hurting- been taking Naproxyn- helps some but having trouble laying on that side at night to sleep    Hip Pain  There was no injury mechanism. The pain is present in the left hip (duration 2 months). The quality of the pain is described as aching. The pain is at a severity of 7/10. The pain is moderate. The pain has been fluctuating since onset. Pertinent negatives include no inability to bear weight, loss of motion, loss of sensation, muscle weakness, numbness or tingling. The symptoms are aggravated by movement and weight bearing. She has tried acetaminophen, NSAIDs and rest for the symptoms. The treatment provided no relief.    No problem-specific assessment & plan notes found for this encounter.   Past Medical History  Diagnosis Date  . Migraine   . Anxiety   . Endometriosis   . Abnormal uterine bleeding   . Anemia   . Bacterial vaginosis   . PCOS (polycystic ovarian syndrome)     Past Surgical History  Procedure Laterality Date  . Salpingectomy Bilateral 2015  . Laparoscopy  1993  . Vaginal hysterectomy      Family History  Problem Relation Age of Onset  . Cancer Maternal Grandmother     breast  . Heart disease Maternal Grandmother   . Heart disease Paternal Grandmother   . Cancer Paternal Grandfather     colon  . Diabetes Paternal Grandfather     Social History   Social History  . Marital Status: Single    Spouse Name: N/A  . Number of Children: N/A  . Years of Education: N/A   Occupational History  . Not on file.   Social History Main Topics  . Smoking status: Former Research scientist (life sciences)  . Smokeless tobacco: Never Used  . Alcohol Use: 0.0 oz/week    0 Standard drinks or equivalent per week      Comment: occas  . Drug Use: No  . Sexual Activity: Yes   Other Topics Concern  . Not on file   Social History Narrative    No Known Allergies   Review of Systems  Constitutional: Negative for fever, chills, weight loss and malaise/fatigue.  HENT: Negative for ear discharge, ear pain and sore throat.   Eyes: Negative for blurred vision.  Respiratory: Negative for cough, sputum production, shortness of breath and wheezing.   Cardiovascular: Negative for chest pain, palpitations and leg swelling.  Gastrointestinal: Negative for heartburn, nausea, abdominal pain, diarrhea, constipation, blood in stool and melena.  Genitourinary: Negative for dysuria, urgency, frequency and hematuria.  Musculoskeletal: Negative for myalgias, back pain, joint pain and neck pain.  Skin: Negative for rash.  Neurological: Negative for dizziness, tingling, sensory change, focal weakness, numbness and headaches.  Endo/Heme/Allergies: Negative for environmental allergies and polydipsia. Does not bruise/bleed easily.  Psychiatric/Behavioral: Negative for depression and suicidal ideas. The patient is not nervous/anxious and does not have insomnia.      Objective  Filed Vitals:   09/10/15 1412  BP: 120/78  Pulse: 64  Height: 5\' 5"  (1.651 m)  Weight: 158 lb (71.668 kg)    Physical Exam  Constitutional: She is well-developed, well-nourished, and  in no distress. No distress.  HENT:  Head: Normocephalic and atraumatic.  Right Ear: External ear normal.  Left Ear: External ear normal.  Nose: Nose normal.  Mouth/Throat: Oropharynx is clear and moist.  Eyes: Conjunctivae and EOM are normal. Pupils are equal, round, and reactive to light. Right eye exhibits no discharge. Left eye exhibits no discharge.  Neck: Normal range of motion. Neck supple. No JVD present. No thyromegaly present.  Cardiovascular: Normal rate, regular rhythm, normal heart sounds and intact distal pulses.  Exam reveals no gallop and  no friction rub.   No murmur heard. Pulmonary/Chest: Effort normal and breath sounds normal.  Abdominal: Soft. Bowel sounds are normal. She exhibits no mass. There is no tenderness. There is no guarding.  Musculoskeletal: Normal range of motion. She exhibits no edema.       Right hip: She exhibits tenderness and bony tenderness. She exhibits normal range of motion, no swelling and no deformity.  Lymphadenopathy:    She has no cervical adenopathy.  Neurological: She is alert. She has normal motor skills, normal sensation, normal strength and normal reflexes.  Skin: Skin is warm and dry. She is not diaphoretic.  Psychiatric: Mood and affect normal.      Assessment & Plan  Problem List Items Addressed This Visit    None    Visit Diagnoses    Hip pain, right    -  Primary    Relevant Medications    meloxicam (MOBIC) 15 MG tablet    traMADol (ULTRAM) 50 MG tablet    Other Relevant Orders    Ambulatory referral to Orthopedic Surgery         Dr. Otilio Miu [facility]  09/10/2015

## 2015-09-17 ENCOUNTER — Encounter: Payer: Self-pay | Admitting: Obstetrics and Gynecology

## 2015-09-17 ENCOUNTER — Ambulatory Visit (INDEPENDENT_AMBULATORY_CARE_PROVIDER_SITE_OTHER): Payer: Managed Care, Other (non HMO) | Admitting: Obstetrics and Gynecology

## 2015-09-17 DIAGNOSIS — Z01419 Encounter for gynecological examination (general) (routine) without abnormal findings: Secondary | ICD-10-CM | POA: Diagnosis not present

## 2015-09-17 DIAGNOSIS — E663 Overweight: Secondary | ICD-10-CM | POA: Diagnosis not present

## 2015-09-17 DIAGNOSIS — E669 Obesity, unspecified: Secondary | ICD-10-CM | POA: Insufficient documentation

## 2015-09-17 NOTE — Patient Instructions (Signed)
  Place annual gynecologic exam patient instructions here.  Thank you for enrolling in [facility]. Please follow the instructions below to securely access your online medical record. MyChart allows you to send messages to your doctor, view your test results, manage appointments, and more.   How Do I Sign Up? 1. In your Internet browser, go to [employer] and enter [email or web address]. 2. Click on the Sign Up Now link in the Sign In box. You will see the New Member Sign Up page. 3. Enter your MyChart Access Code exactly as it appears below. You will not need to use this code after you've completed the sign-up process. If you do not sign up before the expiration date, you must request a new code.  MyChart Access Code: [email or web address] Expires: 10/08/2015  1:17 PM  4. Enter your Social Security Number ([id number]) and Date of Birth (mm/dd/yyyy) as indicated and click Submit. You will be taken to the next sign-up page. 5. Create a MyChart ID. This will be your MyChart login ID and cannot be changed, so think of one that is secure and easy to remember. 6. Create a MyChart password. You can change your password at any time. 7. Enter your Password Reset Question and Answer. This can be used at a later time if you forget your password.  8. Enter your e-mail address. You will receive e-mail notification when new information is available in [facility]. 9. Click Sign Up. You can now view your medical record.   Additional Information Remember, MyChart is NOT to be used for urgent needs. For medical emergencies, dial 911.

## 2015-09-17 NOTE — Progress Notes (Signed)
Subjective:   Katelyn Hobbs is a 39 y.o. No obstetric history on file. Caucasian female here for a routine well-woman exam.  No LMP recorded. Patient has had a hysterectomy.    Current complaints: rectal irritation intermittent PCP: me        Does need &  desire labs  Social History: Sexual: heterosexual Marital Status: co-habitating Living situation: with family Occupation: CMA at [employer]. Tobacco/alcohol: no tobacco use Illicit drugs: no history of illicit drug use  The following portions of the patient's history were reviewed and updated as appropriate: allergies, current medications, past family history, past medical history, past social history, past surgical history and problem list.  Past Medical History Past Medical History  Diagnosis Date  . Migraine   . Anxiety   . Endometriosis   . Abnormal uterine bleeding   . Anemia   . Bacterial vaginosis   . PCOS (polycystic ovarian syndrome)     Past Surgical History Past Surgical History  Procedure Laterality Date  . Salpingectomy Bilateral 2015  . Laparoscopy  1993  . Vaginal hysterectomy      Gynecologic History No obstetric history on file.  No LMP recorded. Patient has had a hysterectomy. Contraception: status post hysterectomy Last Pap: 2015. Results were: normal Last mammogram: 2013. Results were: normal  Obstetric History OB History  No data available    Current Medications Current Outpatient Prescriptions on File Prior to Visit  Medication Sig Dispense Refill  . ALPRAZolam (XANAX) 1 MG tablet Take 1 tablet (1 mg total) by mouth 2 (two) times daily as needed for anxiety. (Patient taking differently: Take 1 mg by mouth at bedtime. Melody Burr) 45 tablet 4  . citalopram (CELEXA) 20 MG tablet Take 1 tablet (20 mg total) by mouth daily. (Patient taking differently: Take 20 mg by mouth daily. Melody Burr) 30 tablet 2   No current facility-administered medications on file prior to visit.    Review  of Systems Patient denies any headaches, blurred vision, shortness of breath, chest pain, abdominal pain, problems with bowel movements, urination, or intercourse.  Objective:  There were no vitals taken for this visit. Physical Exam  General:  Well developed, well nourished, no acute distress. She is alert and oriented x3. Skin:  Warm and dry Neck:  Midline trachea, no thyromegaly or nodules Cardiovascular: Regular rate and rhythm, no murmur heard Lungs:  Effort normal, all lung fields clear to auscultation bilaterally Breasts:  No dominant palpable mass, retraction, or nipple discharge Abdomen:  Soft, non tender, no hepatosplenomegaly or masses Pelvic:  External genitalia is normal in appearance.  Cervix and uterus are surgically absent.  Thin prep pap is not done.  No adnexal masses or tenderness noted. Rectal: Good sphincter tone, no polyps, or hemorrhoids felt. Small & superficial tear at 11 and 2 oclock Extremities:  No swelling or varicosities noted Psych:  She has a normal mood and affect  Assessment:   Healthy well-woman exam S/p hysterectomy Anxiety affecting sleep Rectal irritation Overweight  Plan:  Labs obtained F/U 1 year for AE, or sooner if needed Mammogram scheduled RTC next week to restart weight loss program.  Joylene Igo, CNM

## 2015-09-18 LAB — COMPREHENSIVE METABOLIC PANEL
ALBUMIN: 4.5 g/dL (ref 3.5–5.5)
ALT: 19 IU/L (ref 0–32)
AST: 21 IU/L (ref 0–40)
Albumin/Globulin Ratio: 1.7 (ref 1.1–2.5)
Alkaline Phosphatase: 66 IU/L (ref 39–117)
BUN / CREAT RATIO: 15 (ref 9–23)
BUN: 11 mg/dL (ref 6–24)
Bilirubin Total: 0.3 mg/dL (ref 0.0–1.2)
CALCIUM: 9.3 mg/dL (ref 8.7–10.2)
CHLORIDE: 98 mmol/L (ref 97–106)
CO2: 28 mmol/L (ref 18–29)
CREATININE: 0.74 mg/dL (ref 0.57–1.00)
GFR calc non Af Amer: 102 mL/min/{1.73_m2} (ref >59)
GFR, EST AFRICAN AMERICAN: 117 mL/min/{1.73_m2} (ref >59)
GLUCOSE: 122 mg/dL — AB (ref 65–99)
Globulin, Total: 2.6 g/dL (ref 1.5–4.5)
Potassium: 3.6 mmol/L (ref 3.5–5.2)
Sodium: 140 mmol/L (ref 136–144)
TOTAL PROTEIN: 7.1 g/dL (ref 6.0–8.5)

## 2015-09-18 LAB — LIPID PANEL
CHOL/HDL RATIO: 2.7 ratio (ref 0.0–4.4)
Cholesterol, Total: 188 mg/dL (ref 100–199)
HDL: 69 mg/dL (ref >39)
LDL Calculated: 89 mg/dL (ref 0–99)
Triglycerides: 148 mg/dL (ref 0–149)
VLDL Cholesterol Cal: 30 mg/dL (ref 5–40)

## 2015-09-18 LAB — CBC
Hematocrit: 42.2 % (ref 34.0–46.6)
Hemoglobin: 14.2 g/dL (ref 11.1–15.9)
MCH: 32.1 pg (ref 26.6–33.0)
MCHC: 33.6 g/dL (ref 31.5–35.7)
MCV: 96 fL (ref 79–97)
PLATELETS: 242 10*3/uL (ref 150–379)
RBC: 4.42 x10E6/uL (ref 3.77–5.28)
RDW: 13 % (ref 12.3–15.4)
WBC: 7.9 10*3/uL (ref 3.4–10.8)

## 2015-09-18 LAB — VITAMIN D 25 HYDROXY (VIT D DEFICIENCY, FRACTURES): VIT D 25 HYDROXY: 14.9 ng/mL — AB (ref 30.0–100.0)

## 2015-09-21 ENCOUNTER — Other Ambulatory Visit: Payer: Self-pay | Admitting: Obstetrics and Gynecology

## 2015-09-21 DIAGNOSIS — R7309 Other abnormal glucose: Secondary | ICD-10-CM | POA: Insufficient documentation

## 2015-09-21 DIAGNOSIS — E559 Vitamin D deficiency, unspecified: Secondary | ICD-10-CM | POA: Insufficient documentation

## 2015-09-21 MED ORDER — VITAMIN D (ERGOCALCIFEROL) 1.25 MG (50000 UNIT) PO CAPS: 50000.0000 [IU] | ORAL_CAPSULE | ORAL | Status: DC

## 2015-09-24 ENCOUNTER — Ambulatory Visit (INDEPENDENT_AMBULATORY_CARE_PROVIDER_SITE_OTHER): Payer: Managed Care, Other (non HMO) | Admitting: Obstetrics and Gynecology

## 2015-09-24 ENCOUNTER — Telehealth: Payer: Self-pay | Admitting: *Deleted

## 2015-09-24 VITALS — BP 128/82 | HR 86 | Wt 159.6 lb

## 2015-09-24 DIAGNOSIS — E669 Obesity, unspecified: Secondary | ICD-10-CM

## 2015-09-24 MED ORDER — PHENTERMINE HCL 37.5 MG PO TABS: 37.5000 mg | ORAL_TABLET | Freq: Every day | ORAL | Status: DC

## 2015-09-24 MED ORDER — CYANOCOBALAMIN 1000 MCG/ML IJ SOLN
1000.0000 ug | Freq: Once | INTRAMUSCULAR | Status: AC
  Administered 2015-09-24: 1000 ug via INTRAMUSCULAR

## 2015-09-24 MED ORDER — CYANOCOBALAMIN 1000 MCG/ML IJ SOLN
1000.0000 ug | INTRAMUSCULAR | Status: DC
Start: 2015-09-24 — End: 2016-02-18

## 2015-09-24 NOTE — Telephone Encounter (Signed)
-----   Message from Joylene Igo, [place] sent at 09/21/2015  7:54 AM EST ----- Please let her know vit D is still really low- needs to continue on twice weekly supplement and I sent in a refill. Also sugar a little high, was she fasting?  i want to repeat both labs in 3 months and please fast before. Orders are in.

## 2015-09-24 NOTE — Progress Notes (Cosign Needed)
Pt is here for weight management Pt is aware of all side effects and is dieting and exercising, pt will return to clinic in 4 weeks for follow up- wt, bp, b-12

## 2015-09-24 NOTE — Telephone Encounter (Signed)
Notified pt of results, she wasn't fasting, she voiced understanding

## 2015-10-22 ENCOUNTER — Ambulatory Visit (INDEPENDENT_AMBULATORY_CARE_PROVIDER_SITE_OTHER): Payer: Managed Care, Other (non HMO) | Admitting: Obstetrics and Gynecology

## 2015-10-22 VITALS — BP 108/80 | HR 114 | Wt 151.2 lb

## 2015-10-22 DIAGNOSIS — E669 Obesity, unspecified: Secondary | ICD-10-CM | POA: Diagnosis not present

## 2015-10-22 MED ORDER — CYANOCOBALAMIN 1000 MCG/ML IJ SOLN
1000.0000 ug | Freq: Once | INTRAMUSCULAR | Status: AC
  Administered 2015-10-22: 1000 ug via INTRAMUSCULAR

## 2015-10-22 NOTE — Progress Notes (Cosign Needed)
Pt is here for wt, bp check, b-12 inj Denies any s/e pt is doing very well  09/24/15 wt-159 10/22/15 wt-151.2lb

## 2015-10-27 HISTORY — PX: VAGINAL HYSTERECTOMY: SUR661

## 2015-11-11 ENCOUNTER — Telehealth: Payer: Self-pay | Admitting: Obstetrics and Gynecology

## 2015-11-11 ENCOUNTER — Other Ambulatory Visit: Payer: Self-pay | Admitting: *Deleted

## 2015-11-11 MED ORDER — SCOPOLAMINE 1 MG/3DAYS TD PT72: 1.0000 | MEDICATED_PATCH | TRANSDERMAL | Status: DC

## 2015-11-11 NOTE — Telephone Encounter (Signed)
Transdermal patch for sea sickeness  scapalamine patch? She asked for 2 patches for a cruise this coming w/e

## 2015-11-11 NOTE — Telephone Encounter (Signed)
Done-ac 

## 2015-11-23 ENCOUNTER — Other Ambulatory Visit: Payer: Self-pay | Admitting: Obstetrics and Gynecology

## 2015-11-26 ENCOUNTER — Other Ambulatory Visit: Payer: Self-pay | Admitting: *Deleted

## 2015-11-26 ENCOUNTER — Telehealth: Payer: Self-pay | Admitting: Obstetrics and Gynecology

## 2015-11-26 ENCOUNTER — Ambulatory Visit: Payer: Managed Care, Other (non HMO)

## 2015-11-26 MED ORDER — CITALOPRAM HYDROBROMIDE 20 MG PO TABS: 20.0000 mg | ORAL_TABLET | Freq: Every day | ORAL | Status: DC

## 2015-11-26 NOTE — Telephone Encounter (Signed)
Done-ac 

## 2015-11-26 NOTE — Telephone Encounter (Signed)
THE PHARMACY SAID THEY SENT A REFILL REQUEST FOR CELEXA AND HAVE NOT GOT  IT BACK

## 2015-12-03 ENCOUNTER — Ambulatory Visit (INDEPENDENT_AMBULATORY_CARE_PROVIDER_SITE_OTHER): Payer: Managed Care, Other (non HMO) | Admitting: Obstetrics and Gynecology

## 2015-12-03 VITALS — BP 121/75 | HR 111 | Wt 152.1 lb

## 2015-12-03 DIAGNOSIS — E669 Obesity, unspecified: Secondary | ICD-10-CM | POA: Diagnosis not present

## 2015-12-03 MED ORDER — CYANOCOBALAMIN 1000 MCG/ML IJ SOLN
1000.0000 ug | Freq: Once | INTRAMUSCULAR | Status: AC
  Administered 2015-12-03: 1000 ug via INTRAMUSCULAR

## 2015-12-03 NOTE — Progress Notes (Cosign Needed)
Pt is here for wt, bp check,b-12 inj She has been doing well, however went on a cruise this past week and ate very well  09-24-15 wt-159 10-22-15 wt-151

## 2015-12-21 ENCOUNTER — Other Ambulatory Visit: Payer: Self-pay | Admitting: Obstetrics and Gynecology

## 2015-12-24 ENCOUNTER — Telehealth: Payer: Self-pay | Admitting: Obstetrics and Gynecology

## 2015-12-24 NOTE — Telephone Encounter (Signed)
Patient called requesting a refill on alprazolam.Thanks

## 2015-12-24 NOTE — Telephone Encounter (Signed)
Was faxed to pharmacy 12/24/15

## 2015-12-31 ENCOUNTER — Ambulatory Visit (INDEPENDENT_AMBULATORY_CARE_PROVIDER_SITE_OTHER): Payer: Managed Care, Other (non HMO) | Admitting: Obstetrics and Gynecology

## 2015-12-31 VITALS — BP 123/85 | HR 94 | Wt 153.1 lb

## 2015-12-31 DIAGNOSIS — E663 Overweight: Secondary | ICD-10-CM

## 2015-12-31 MED ORDER — CYANOCOBALAMIN 1000 MCG/ML IJ SOLN
1000.0000 ug | Freq: Once | INTRAMUSCULAR | Status: AC
  Administered 2015-12-31: 1000 ug via INTRAMUSCULAR

## 2015-12-31 NOTE — Progress Notes (Signed)
Patient ID: Katelyn Hobbs, female   DOB: [birth year], 40 y.o.   MRN: [id number] Pt presents for weight, B/P, B-12 injection. No side effects of medication-Phentermine, or B-12.  Weight gain of 1 lbs. Encouraged eating healthy and exercise.

## 2016-01-14 ENCOUNTER — Telehealth: Payer: Self-pay | Admitting: Obstetrics and Gynecology

## 2016-01-14 NOTE — Telephone Encounter (Signed)
Pt called and she is having trouble with getting her pharmacy to send a refill request in on he rphentermne but she needs a refill on her phentermine and she is schedule to see MNS at the end of April.

## 2016-01-14 NOTE — Telephone Encounter (Signed)
As long as she has been off for 2 weeks, she can restart, but has to have a nurse visit to restart, see if she can come by one day this week

## 2016-01-14 NOTE — Telephone Encounter (Signed)
Mel, pt needs to be off of medication correct?? We dont send in rx, she has appt 02/18/16 for weight management

## 2016-01-20 NOTE — Telephone Encounter (Signed)
Done-ac 

## 2016-02-18 ENCOUNTER — Other Ambulatory Visit: Payer: Self-pay | Admitting: Obstetrics and Gynecology

## 2016-02-18 ENCOUNTER — Ambulatory Visit (INDEPENDENT_AMBULATORY_CARE_PROVIDER_SITE_OTHER): Payer: Managed Care, Other (non HMO) | Admitting: Obstetrics and Gynecology

## 2016-02-18 ENCOUNTER — Encounter: Payer: Self-pay | Admitting: Obstetrics and Gynecology

## 2016-02-18 VITALS — BP 103/69 | HR 84 | Ht 64.0 in | Wt 148.5 lb

## 2016-02-18 DIAGNOSIS — E663 Overweight: Secondary | ICD-10-CM

## 2016-02-18 DIAGNOSIS — E559 Vitamin D deficiency, unspecified: Secondary | ICD-10-CM

## 2016-02-18 DIAGNOSIS — R7309 Other abnormal glucose: Secondary | ICD-10-CM | POA: Diagnosis not present

## 2016-02-18 MED ORDER — PHENTERMINE HCL 37.5 MG PO TABS: 37.5000 mg | ORAL_TABLET | Freq: Every day | ORAL | Status: DC

## 2016-02-18 MED ORDER — CYANOCOBALAMIN 1000 MCG/ML IJ SOLN: 1000.0000 ug | INTRAMUSCULAR | Status: DC

## 2016-02-18 NOTE — Progress Notes (Signed)
Subjective:     Patient ID: Katelyn Hobbs, female   DOB: 25-Mar-1976, 40 y.o.   MRN: [id number]  HPI Here to repeat labs and needs refill on weight loss meds.  Total weight loss x 6 months is 12#  Stopped Vit D after a few weeks as it made her nauseated in the mornings.  Review of Systems See above    Objective:   Physical Exam A&O x4 Well groomed female in no distress Blood pressure 103/69, pulse 84, height '5\' 4"'  (1.626 m), weight 148 lb 8 oz (67.359 kg). HRR     Assessment:     Vitamin D Deficiency Overweight Elevated glucose      Plan:     Labs repeated. Refilled Adipex for use to get last 6 # off then OK to use prn. To try OTC vit D 5000IU daily instead of prescription  RTC as needed.  Manya Balash [place], CNM

## 2016-02-20 LAB — VITAMIN D 25 HYDROXY (VIT D DEFICIENCY, FRACTURES): VIT D 25 HYDROXY: 17.6 ng/mL — AB (ref 30.0–100.0)

## 2016-02-20 LAB — HEMOGLOBIN A1C
ESTIMATED AVERAGE GLUCOSE: 105 mg/dL
Hgb A1c MFr Bld: 5.3 % (ref 4.8–5.6)

## 2016-02-20 LAB — GLUCOSE, FASTING: GLUCOSE, PLASMA: 73 mg/dL (ref 65–99)

## 2016-03-05 ENCOUNTER — Other Ambulatory Visit: Payer: Self-pay | Admitting: Obstetrics and Gynecology

## 2016-03-05 ENCOUNTER — Telehealth: Payer: Self-pay | Admitting: *Deleted

## 2016-03-05 MED ORDER — CEFDINIR 300 MG PO CAPS: 300.0000 mg | ORAL_CAPSULE | Freq: Two times a day (BID) | ORAL | Status: DC

## 2016-03-05 NOTE — Telephone Encounter (Signed)
Please let her know I sent in an antibiotic.

## 2016-03-05 NOTE — Telephone Encounter (Signed)
Patient called and states she has a bad cold. Patient wants to know if Melody can call her in something or if she can??? Patient doesn't have a PCP. Her # is [phone].

## 2016-03-05 NOTE — Telephone Encounter (Signed)
Head and chest congestion, cough is getting productive

## 2016-03-06 NOTE — Telephone Encounter (Signed)
Done-ac 

## 2016-03-10 ENCOUNTER — Ambulatory Visit: Payer: Managed Care, Other (non HMO) | Admitting: Family Medicine

## 2016-03-13 ENCOUNTER — Ambulatory Visit (INDEPENDENT_AMBULATORY_CARE_PROVIDER_SITE_OTHER): Payer: Managed Care, Other (non HMO) | Admitting: Family Medicine

## 2016-03-13 ENCOUNTER — Encounter: Payer: Self-pay | Admitting: Family Medicine

## 2016-03-13 VITALS — BP 112/68 | HR 90 | Temp 98.5°F | Resp 16 | Ht 64.0 in | Wt 144.0 lb

## 2016-03-13 DIAGNOSIS — J40 Bronchitis, not specified as acute or chronic: Secondary | ICD-10-CM | POA: Diagnosis not present

## 2016-03-13 DIAGNOSIS — J0100 Acute maxillary sinusitis, unspecified: Secondary | ICD-10-CM

## 2016-03-13 MED ORDER — GUAIFENESIN-CODEINE 100-10 MG/5ML PO SYRP: 5.0000 mL | ORAL_SOLUTION | Freq: Three times a day (TID) | ORAL | Status: DC | PRN

## 2016-03-13 MED ORDER — BENZONATATE 100 MG PO CAPS: 100.0000 mg | ORAL_CAPSULE | Freq: Two times a day (BID) | ORAL | Status: DC | PRN

## 2016-03-13 MED ORDER — AZITHROMYCIN 250 MG PO TABS: ORAL_TABLET | ORAL | Status: DC

## 2016-03-13 NOTE — Progress Notes (Signed)
Name: Katelyn Hobbs   MRN: [id number]    DOB: 1976/06/30   Date:03/13/2016       Progress Note  Subjective  Chief Complaint  Chief Complaint  Patient presents with  . Sinusitis    X 2 weeks.     Sinusitis This is a new problem. The current episode started in the past 7 days. The problem has been waxing and waning (earlier diarrhea) since onset. The maximum temperature recorded prior to her arrival was 100.4 - 100.9 F. Associated symptoms include chills, congestion, coughing, diaphoresis, ear pain, headaches, sinus pressure, sneezing, a sore throat and swollen glands. Pertinent negatives include no hoarse voice, neck pain or shortness of breath. Past treatments include acetaminophen and oral decongestants. The treatment provided no relief.    No problem-specific assessment & plan notes found for this encounter.   Past Medical History  Diagnosis Date  . Migraine   . Anxiety   . Endometriosis   . Abnormal uterine bleeding   . Anemia   . Bacterial vaginosis   . PCOS (polycystic ovarian syndrome)     Past Surgical History  Procedure Laterality Date  . Salpingectomy Bilateral 2015  . Laparoscopy  1993  . Vaginal hysterectomy      Family History  Problem Relation Age of Onset  . Cancer Maternal Grandmother     breast  . Heart disease Maternal Grandmother   . Heart disease Paternal Grandmother   . Cancer Paternal Grandfather     colon  . Diabetes Paternal Grandfather     Social History   Social History  . Marital Status: Single    Spouse Name: N/A  . Number of Children: N/A  . Years of Education: N/A   Occupational History  . Not on file.   Social History Main Topics  . Smoking status: Former Research scientist (life sciences)  . Smokeless tobacco: Never Used  . Alcohol Use: 0.0 oz/week    0 Standard drinks or equivalent per week     Comment: occas  . Drug Use: No  . Sexual Activity: Yes   Other Topics Concern  . Not on file   Social History Narrative    No Known  Allergies   Review of Systems  Constitutional: Positive for chills and diaphoresis. Negative for fever, weight loss and malaise/fatigue.  HENT: Positive for congestion, ear pain, sinus pressure, sneezing and sore throat. Negative for ear discharge and hoarse voice.   Eyes: Negative for blurred vision.  Respiratory: Positive for cough. Negative for sputum production, shortness of breath and wheezing.   Cardiovascular: Negative for chest pain, palpitations and leg swelling.  Gastrointestinal: Negative for heartburn, nausea, abdominal pain, diarrhea, constipation, blood in stool and melena.  Genitourinary: Negative for dysuria, urgency, frequency and hematuria.  Musculoskeletal: Negative for myalgias, back pain, joint pain and neck pain.  Skin: Negative for rash.  Neurological: Positive for headaches. Negative for dizziness, tingling, sensory change and focal weakness.  Endo/Heme/Allergies: Negative for environmental allergies and polydipsia. Does not bruise/bleed easily.  Psychiatric/Behavioral: Negative for depression and suicidal ideas. The patient is not nervous/anxious and does not have insomnia.      Objective  Filed Vitals:   03/13/16 0808  BP: 112/68  Pulse: 90  Temp: 98.5 F (36.9 C)  Resp: 16  Height: 5\' 4"  (1.626 m)  Weight: 144 lb (65.318 kg)    Physical Exam  Constitutional: She is well-developed, well-nourished, and in no distress. No distress.  HENT:  Head: Normocephalic and atraumatic.  Right Ear: External  ear normal.  Left Ear: External ear normal.  Nose: Nose normal.  Mouth/Throat: Oropharynx is clear and moist.  Eyes: Conjunctivae and EOM are normal. Pupils are equal, round, and reactive to light. Right eye exhibits no discharge. Left eye exhibits no discharge.  Neck: Normal range of motion. Neck supple. No JVD present. No thyromegaly present.  Cardiovascular: Normal rate, regular rhythm, normal heart sounds and intact distal pulses.  Exam reveals no gallop  and no friction rub.   No murmur heard. Pulmonary/Chest: Effort normal and breath sounds normal.  Abdominal: Soft. Bowel sounds are normal. She exhibits no mass. There is no tenderness. There is no guarding.  Musculoskeletal: Normal range of motion. She exhibits no edema.  Lymphadenopathy:    She has no cervical adenopathy.  Neurological: She is alert. She has normal reflexes.  Skin: Skin is warm and dry. She is not diaphoretic.  Psychiatric: Mood and affect normal.  Nursing note and vitals reviewed.     Assessment & Plan  Problem List Items Addressed This Visit    None    Visit Diagnoses    Acute maxillary sinusitis, recurrence not specified    -  Primary    Relevant Medications    azithromycin (ZITHROMAX) 250 MG tablet    guaiFENesin-codeine (ROBITUSSIN AC) 100-10 MG/5ML syrup    benzonatate (TESSALON) 100 MG capsule    Bronchitis        Relevant Medications    azithromycin (ZITHROMAX) 250 MG tablet    guaiFENesin-codeine (ROBITUSSIN AC) 100-10 MG/5ML syrup    benzonatate (TESSALON) 100 MG capsule         Dr. Hebert Dooling [facility]  03/13/2016

## 2016-03-28 ENCOUNTER — Other Ambulatory Visit: Payer: Self-pay | Admitting: Obstetrics and Gynecology

## 2016-05-11 ENCOUNTER — Other Ambulatory Visit: Payer: Self-pay | Admitting: Obstetrics and Gynecology

## 2016-05-12 NOTE — Telephone Encounter (Signed)
See message.

## 2016-07-06 ENCOUNTER — Other Ambulatory Visit: Payer: Self-pay | Admitting: Obstetrics and Gynecology

## 2016-07-06 ENCOUNTER — Ambulatory Visit (INDEPENDENT_AMBULATORY_CARE_PROVIDER_SITE_OTHER): Payer: 59 | Admitting: Internal Medicine

## 2016-07-06 ENCOUNTER — Encounter: Payer: Self-pay | Admitting: Internal Medicine

## 2016-07-06 VITALS — BP 120/70 | HR 70 | Ht 64.0 in | Wt 139.0 lb

## 2016-07-06 DIAGNOSIS — N3001 Acute cystitis with hematuria: Secondary | ICD-10-CM | POA: Diagnosis not present

## 2016-07-06 LAB — POC URINALYSIS WITH MICROSCOPIC (NON AUTO)MANUAL RESULT
Bilirubin, UA: NEGATIVE
CRYSTALS: 0
Epithelial cells, urine per micros: 0
GLUCOSE UA: NEGATIVE
Ketones, UA: NEGATIVE
Leukocytes, UA: NEGATIVE
MUCUS UA: 0
Nitrite, UA: NEGATIVE
RBC: 10 M/uL — AB (ref 4.04–5.48)
Spec Grav, UA: 1.015
Urobilinogen, UA: 0.2
WBC Casts, UA: 2
pH, UA: 6

## 2016-07-06 MED ORDER — CIPROFLOXACIN HCL 250 MG PO TABS: 250.0000 mg | ORAL_TABLET | Freq: Two times a day (BID) | ORAL | 0 refills | Status: DC

## 2016-07-06 NOTE — Patient Instructions (Signed)

## 2016-07-06 NOTE — Progress Notes (Signed)
    Date:  07/06/2016   Name:  Katelyn Hobbs   DOB:  08/18/1976   MRN:  [id number]   Chief Complaint: Urinary Tract Infection (Alot of pressure and drinking alot but not having much output)  Urinary Tract Infection   This is a new problem. The current episode started in the past 7 days. The problem occurs every urination. The problem has been unchanged. The quality of the pain is described as burning. Associated symptoms include frequency and urgency. Pertinent negatives include no chills.     Review of Systems  Constitutional: Negative for chills, fatigue and fever.  Respiratory: Negative for cough, chest tightness and shortness of breath.   Cardiovascular: Negative for chest pain and leg swelling.  Genitourinary: Positive for frequency, pelvic pain and urgency. Negative for dysuria.    Patient Active Problem List   Diagnosis Date Noted  . Vitamin D deficiency 09/21/2015  . Elevated glucose level 09/21/2015  . Overweight 09/17/2015    Prior to Admission medications   Medication Sig Start Date End Date Taking? Authorizing Provider  ALPRAZolam (XANAX) 1 MG tablet TAKE 1 TABLET BY MOUTH TWICE DAILY AS NEEDED FOR ANXIETY Patient taking differently: 1 daily bedtime- Melody Burr 05/12/16  Yes Melody N Shambley, CNM  citalopram (CELEXA) 20 MG tablet Take 1 tablet (20 mg total) by mouth daily. Melody Trudee Kuster 11/26/15  Yes Melody N Shambley, CNM  cyanocobalamin (,VITAMIN B-12,) 1000 MCG/ML injection Inject 1 mL (1,000 mcg total) into the muscle every 30 (thirty) days. 02/18/16  Yes Melody N Shambley, CNM  Vitamin D, Ergocalciferol, (DRISDOL) 50000 UNITS CAPS capsule Take 1 capsule (50,000 Units total) by mouth 2 (two) times a week. 09/21/15  Yes Melody Rockney Ghee, CNM    No Known Allergies  Past Surgical History:  Procedure Laterality Date  . LAPAROSCOPY  1993  . SALPINGECTOMY Bilateral 2015  . VAGINAL HYSTERECTOMY      Social History  Substance Use Topics  . Smoking status: Former  Research scientist (life sciences)  . Smokeless tobacco: Never Used  . Alcohol use 0.0 oz/week     Comment: occas     Medication list has been reviewed and updated.   Physical Exam  Constitutional: She appears well-developed and well-nourished.  Cardiovascular: Normal rate, regular rhythm and normal heart sounds.   Pulmonary/Chest: Effort normal and breath sounds normal. No respiratory distress.  Abdominal: Soft. Bowel sounds are normal. There is tenderness in the suprapubic area. There is no rebound, no guarding and no CVA tenderness.  Psychiatric: She has a normal mood and affect.  Nursing note and vitals reviewed.   BP 120/70   Pulse 70   Ht 5\' 4"  (1.626 m)   Wt 139 lb (63 kg)   BMI 23.86 kg/m   Assessment and Plan: 1. Acute cystitis with hematuria Follow up abdominal discomfort persists - POC urinalysis w microscopic (non auto) - ciprofloxacin (CIPRO) 250 MG tablet; Take 1 tablet (250 mg total) by mouth 2 (two) times daily.  Dispense: 14 tablet; Refill: 0   Halina Maidens, MD [facility]  07/06/2016

## 2016-07-13 ENCOUNTER — Encounter: Payer: Self-pay | Admitting: Obstetrics and Gynecology

## 2016-08-03 ENCOUNTER — Other Ambulatory Visit: Payer: Self-pay | Admitting: Obstetrics and Gynecology

## 2016-08-03 NOTE — Telephone Encounter (Signed)
pls see message about medication

## 2016-08-19 ENCOUNTER — Emergency Department: Payer: Commercial Managed Care - HMO

## 2016-08-19 ENCOUNTER — Emergency Department
Admission: EM | Admit: 2016-08-19 | Discharge: 2016-08-19 | Disposition: A | Discharge status: Home / Self Care | Payer: Commercial Managed Care - HMO | Attending: Emergency Medicine | Admitting: Emergency Medicine

## 2016-08-19 ENCOUNTER — Encounter: Payer: Self-pay | Admitting: Emergency Medicine

## 2016-08-19 DIAGNOSIS — Z79899 Other long term (current) drug therapy: Secondary | ICD-10-CM | POA: Insufficient documentation

## 2016-08-19 DIAGNOSIS — R0789 Other chest pain: Secondary | ICD-10-CM | POA: Insufficient documentation

## 2016-08-19 DIAGNOSIS — R079 Chest pain, unspecified: Secondary | ICD-10-CM | POA: Diagnosis present

## 2016-08-19 DIAGNOSIS — Z87891 Personal history of nicotine dependence: Secondary | ICD-10-CM | POA: Insufficient documentation

## 2016-08-19 LAB — BASIC METABOLIC PANEL
Anion gap: 10 (ref 5–15)
BUN: 5 mg/dL — AB (ref 6–20)
CHLORIDE: 99 mmol/L — AB (ref 101–111)
CO2: 26 mmol/L (ref 22–32)
CREATININE: 0.64 mg/dL (ref 0.44–1.00)
Calcium: 9.2 mg/dL (ref 8.9–10.3)
GFR calc Af Amer: >60 mL/min (ref >60)
GFR calc non Af Amer: >60 mL/min (ref >60)
GLUCOSE: 96 mg/dL (ref 65–99)
Potassium: 3.7 mmol/L (ref 3.5–5.1)
Sodium: 135 mmol/L (ref 135–145)

## 2016-08-19 LAB — CBC
HCT: 46.5 % (ref 35.0–47.0)
Hemoglobin: 16.3 g/dL — ABNORMAL HIGH (ref 12.0–16.0)
MCH: 34.3 pg — AB (ref 26.0–34.0)
MCHC: 35.1 g/dL (ref 32.0–36.0)
MCV: 97.6 fL (ref 80.0–100.0)
PLATELETS: 219 10*3/uL (ref 150–440)
RBC: 4.76 MIL/uL (ref 3.80–5.20)
RDW: 12.7 % (ref 11.5–14.5)
WBC: 8.9 10*3/uL (ref 3.6–11.0)

## 2016-08-19 LAB — TROPONIN I: Troponin I: <0.03 ng/mL (ref <0.03)

## 2016-08-19 IMAGING — CR DG CHEST 2V
2 series · 2 of 2 positions shown · non-contrast
Comparison: [DATE] chest CT angiogram

CLINICAL DATA: Chest pain

EXAM:
CHEST  2 VIEW

[chest pa]
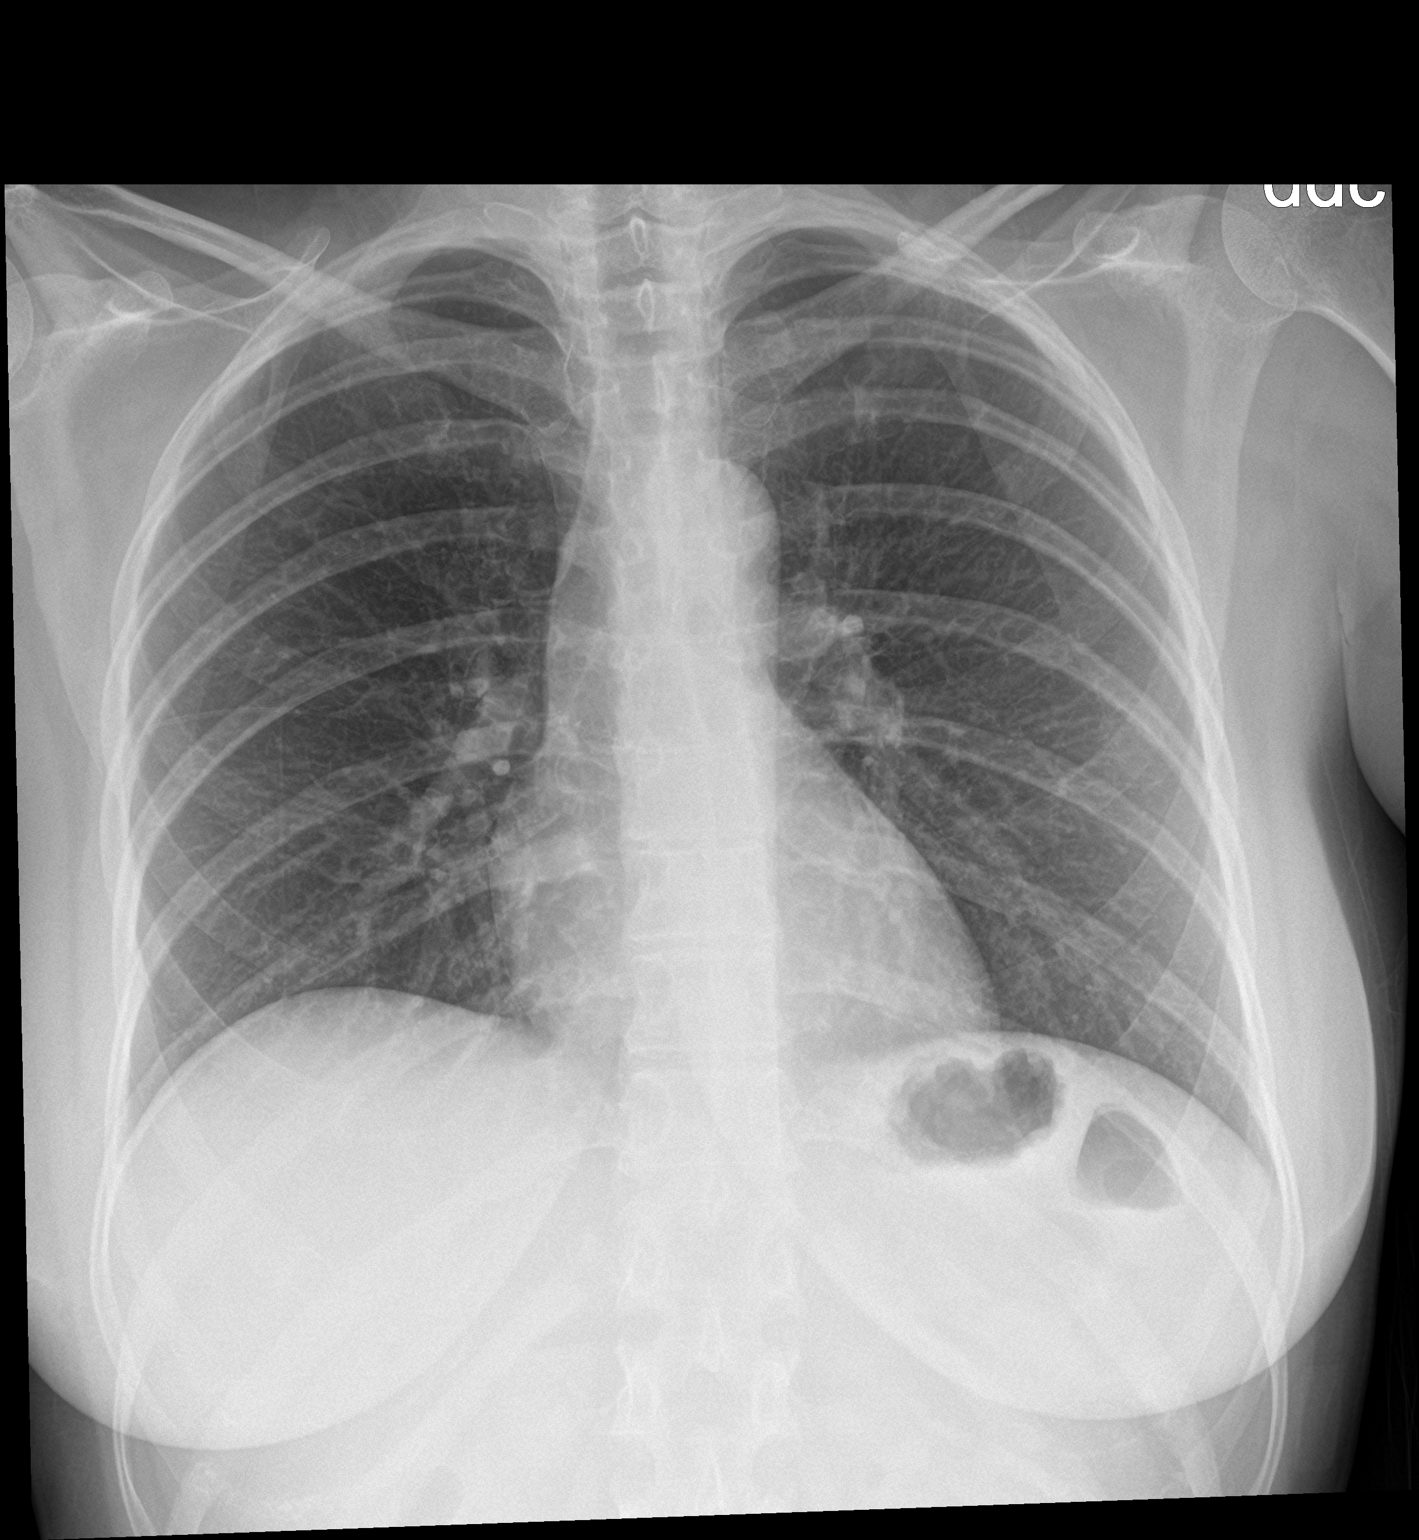

[chest lat]
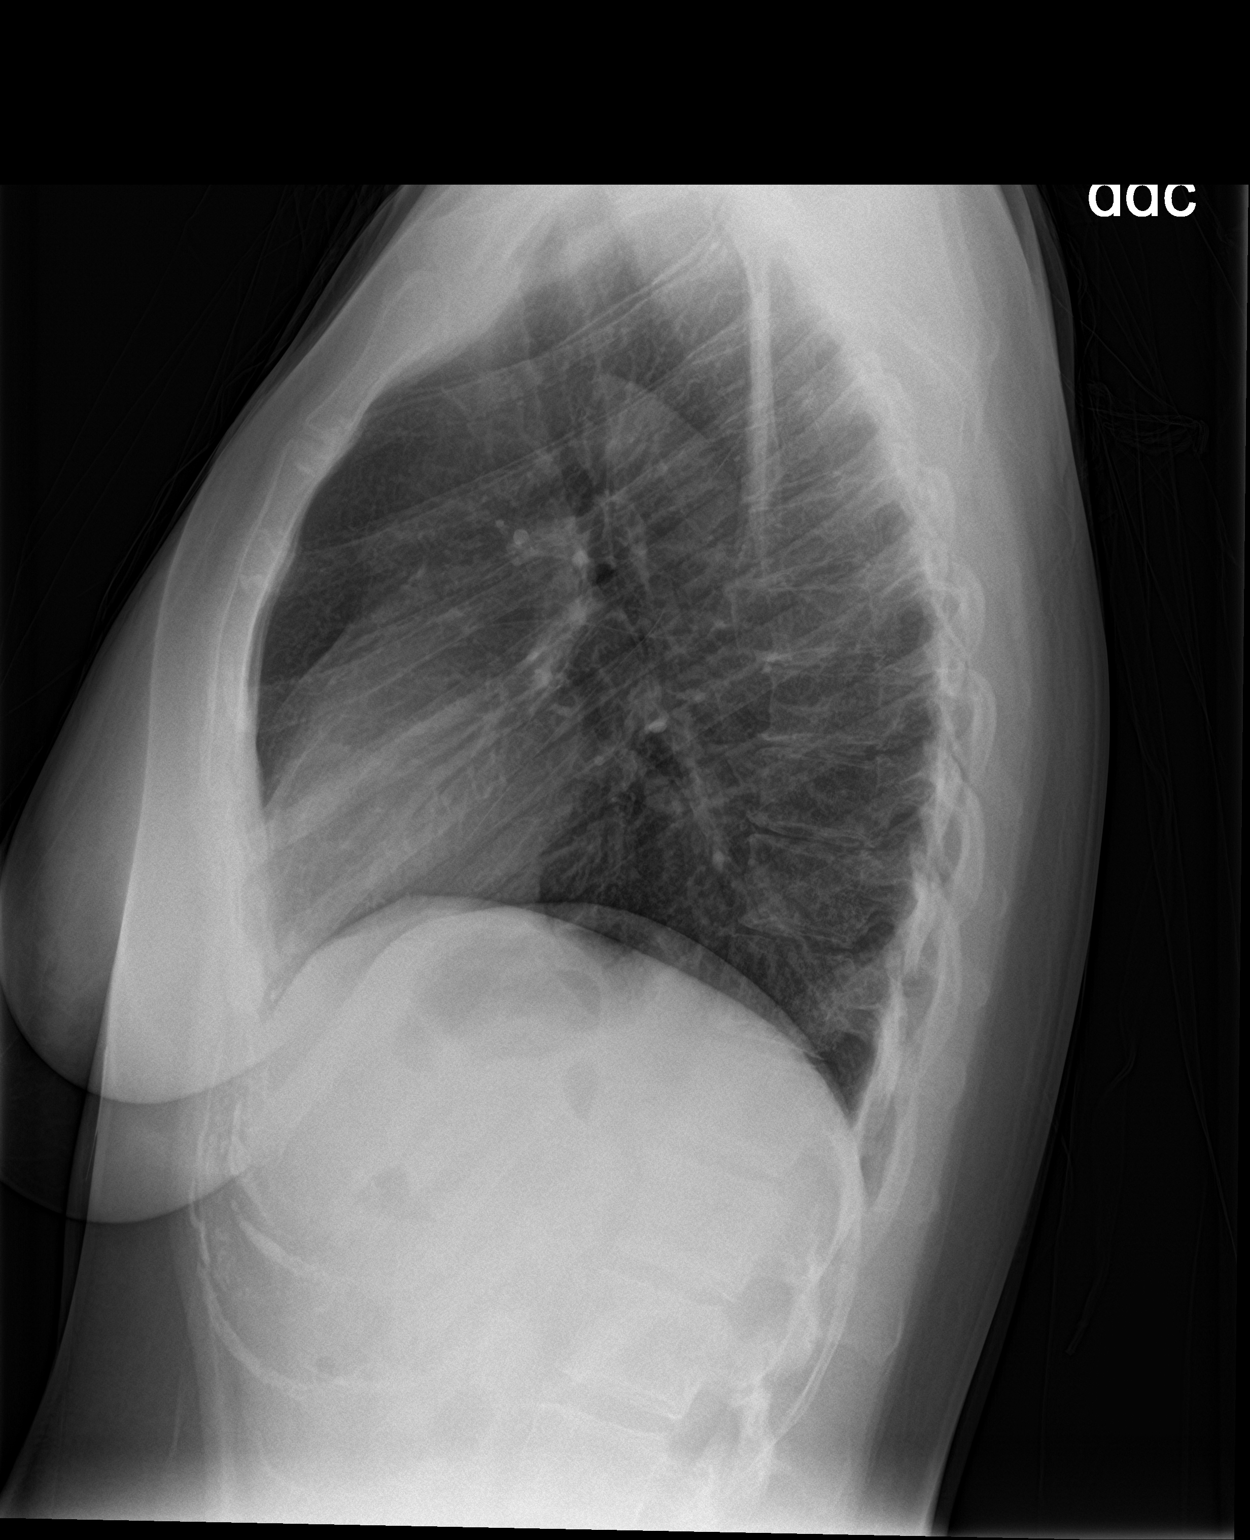

[2 of 2 positions shown; findings below may reference images not displayed]

FINDINGS: Normal heart size. Normal mediastinal contour. No pneumothorax. No
pleural effusion. Lungs appear clear, with no acute consolidative
airspace disease and no pulmonary edema.
IMPRESSION: No active cardiopulmonary disease.

## 2016-08-19 NOTE — ED Provider Notes (Signed)
[facility] Emergency Department Provider Note  ____________________________________________  Time seen: Approximately 1:52 PM  I have reviewed the triage vital signs and the nursing notes.   HISTORY  Chief Complaint Chest Pain    HPI CARLINDA Hobbs is a 40 y.o. female who reports intermittent sharp chest pain worse with deep breathing and movement span occurring for the last 4 or 5 days. This all started after she moved and was carrying heavy furniture and heavy boxes. She had an episode about 5 days ago, resolved, and then is been occasional are current. 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 No exertional symptoms. No vomiting diaphoresis or shortness of breath. No fevers or chills. Never had anything like this before. Patient indicates the pain is in the left inferolateral anterior chest  No history of DVT or PE. No recent travel trauma hospitalizations or surgeries. Does not take exogenous hormones. Smokes occasionally.     Past Medical History:  Diagnosis Date  . Abnormal uterine bleeding   . Anemia   . Anxiety   . Bacterial vaginosis   . Endometriosis   . Migraine   . PCOS (polycystic ovarian syndrome)      Patient Active Problem List   Diagnosis Date Noted  . Vitamin D deficiency 09/21/2015  . Elevated glucose level 09/21/2015  . Overweight 09/17/2015     Past Surgical History:  Procedure Laterality Date  . LAPAROSCOPY  1993  . SALPINGECTOMY Bilateral 2015  . VAGINAL HYSTERECTOMY       Prior to Admission medications   Medication Sig Start Date End Date Taking? Authorizing Provider  ALPRAZolam (XANAX) 1 MG tablet TAKE 1 TABLET BY MOUTH TWICE DAILY AS NEEDED FOR ANXIETY 08/04/16   Melody N Shambley, CNM  ciprofloxacin (CIPRO) 250 MG tablet Take 1 tablet (250 mg total) by mouth 2 (two) times daily. 07/06/16   Glean Hess, MD   citalopram (CELEXA) 20 MG tablet TAKE 1 TABLET BY MOUTH DAILY 08/04/16   Melody N Shambley, CNM  cyanocobalamin (,VITAMIN B-12,) 1000 MCG/ML injection Inject 1 mL (1,000 mcg total) into the muscle every 30 (thirty) days. 02/18/16   Melody N Shambley, CNM  Vitamin D, Ergocalciferol, (DRISDOL) 50000 UNITS CAPS capsule Take 1 capsule (50,000 Units total) by mouth 2 (two) times a week. 09/21/15   Melody N Shambley, CNM     Allergies Review of patient's allergies indicates no known allergies.   Family History  Problem Relation Age of Onset  . Cancer Maternal Grandmother     breast  . Heart disease Maternal Grandmother   . Heart disease Paternal Grandmother   . Cancer Paternal Grandfather     colon  . Diabetes Paternal Grandfather     Social History Social History  Substance Use Topics  . Smoking status: Former Research scientist (life sciences)  . Smokeless tobacco: Never Used  . Alcohol use 0.0 oz/week     Comment: occas    Review of Systems  Constitutional:   No fever or chills.  ENT:   No sore throat. No rhinorrhea. Cardiovascular:   Positive as above chest pain. Respiratory:   No dyspnea or cough.  Musculoskeletal:   Negative for focal pain or swelling  10-point ROS otherwise negative.  ____________________________________________   PHYSICAL EXAM:  VITAL SIGNS: ED Triage Vitals [08/19/16 1011]  Enc Vitals Group     BP (!) 157/100  Pulse Rate 92     Resp (!) 24     Temp 97.7 F (36.5 C)     Temp Source Oral     SpO2 98 %     Weight 135 lb (61.2 kg)     Height 5\' 5"  (1.651 m)     Head Circumference      Peak Flow      Pain Score 10     Pain Loc      Pain Edu?      Excl. in [place]?     Vital signs reviewed, nursing assessments reviewed.   Constitutional:   Alert and oriented. Well appearing and in no distress. Eyes:   No scleral icterus. No conjunctival pallor. PERRL. EOMI.  No nystagmus. ENT   Head:   Normocephalic and atraumatic.   Nose:   No congestion/rhinnorhea. No  septal hematoma   Mouth/Throat:   MMM, no pharyngeal erythema. No peritonsillar mass.    Neck:   No stridor. No SubQ emphysema. No meningismus. Hematological/Lymphatic/Immunilogical:   No cervical lymphadenopathy. Cardiovascular:   RRR. Symmetric bilateral radial and DP pulses.  No murmurs.  Respiratory:   Normal respiratory effort without tachypnea nor retractions. Breath sounds are clear and equal bilaterally. No wheezes/rales/rhonchi.Chest tender on the left inferior anterolateral ribs, reproducing her symptoms. Gastrointestinal:   Soft and nontender. Non distended. There is no CVA tenderness.  No rebound, rigidity, or guarding. Genitourinary:   deferred Musculoskeletal:   Nontender with normal range of motion in all extremities. No joint effusions.  No lower extremity tenderness.  No edema. Neurologic:   Normal speech and language.  CN 2-10 normal. Motor grossly intact. No gross focal neurologic deficits are appreciated.  Skin:    Skin is warm, dry and intact. No rash noted.  No petechiae, purpura, or bullae.  ____________________________________________    LABS (pertinent positives/negatives) (all labs ordered are listed, but only abnormal results are displayed) Labs Reviewed  BASIC METABOLIC PANEL - Abnormal; Notable for the following:       Result Value   Chloride 99 (*)    BUN 5 (*)    All other components within normal limits  CBC - Abnormal; Notable for the following:    Hemoglobin 16.3 (*)    MCH 34.3 (*)    All other components within normal limits  TROPONIN I   ____________________________________________   EKG  Interpreted by me Normal sinus rhythm rate of 86, normal axis intervals QRS ST segments and T waves  ____________________________________________    RADIOLOGY  Chest x-ray unremarkable  ____________________________________________   PROCEDURES Procedures  ____________________________________________   INITIAL IMPRESSION / ASSESSMENT  AND PLAN / ED COURSE  Pertinent labs & imaging results that were available during my care of the patient were reviewed by me and considered in my medical decision making (see chart for details).  Patient well appearing no acute distress. Pain resolved again. Pain is atypical, exam is consistent with chest wall pain or intercostal strain likely related to her moving of heavy furniture recently. Continue NSAIDs, advised on taking an antacid such as Pepcid or Zantac if the NSAIDs or causing stomach upset.  Patient does smoke, she reports yellowish only smokes a few cigarettes in the evenings after working. Counseled her on smoking cessation and explored her readiness for behavioral change for a discussion of 3-5 minutes.  Considering the patient's symptoms, medical history, and physical examination today, I have low suspicion for ACS, PE, TAD, pneumothorax, carditis, mediastinitis, pneumonia, CHF, or sepsis.  Clinical Course   ____________________________________________   FINAL CLINICAL IMPRESSION(S) / ED DIAGNOSES  Final diagnoses:  Chest wall pain       Portions of this note were generated with dragon dictation software. Dictation errors may occur despite best attempts at proofreading.    Carrie Mew, MD 08/19/16 1357

## 2016-08-19 NOTE — ED Notes (Signed)
Patient in [place].  Crying in pain.  Given tissue.  States pain is unchanged from time of triage.  Charge Nurse aware patient is in [place].

## 2016-08-19 NOTE — ED Triage Notes (Signed)
Pt reports increased chest pain since 10/22, reports she moved this weekend. Pt denies birth control use, reports daily smoking. Pt reports pain worse with movement and deep breaths.

## 2016-08-19 NOTE — ED Notes (Signed)
Dr. Joni Fears is aware of pt's pain.

## 2016-08-19 NOTE — ED Notes (Addendum)
Pt c/o left lower chest pain. "under my breast." Pt stating pain is worse with breathing. Pt stating she did have some nausea earlier, but believes it was because of taking tylenol and ibuprofen on an empty stomach. Pt stating she had a similar pain on Saturday but the pain went away. Pt stating she did "move" this last weekend. Pt denies any injuries that she knows of. Pt placed on cardiac monitor. Pt given blanket. Pt is currently crying and family is at bedside.

## 2016-09-02 ENCOUNTER — Other Ambulatory Visit: Payer: Self-pay | Admitting: Obstetrics and Gynecology

## 2016-09-22 ENCOUNTER — Encounter: Payer: Managed Care, Other (non HMO) | Admitting: Obstetrics and Gynecology

## 2016-09-28 ENCOUNTER — Other Ambulatory Visit: Payer: Self-pay | Admitting: Obstetrics and Gynecology

## 2016-09-29 ENCOUNTER — Encounter: Payer: Managed Care, Other (non HMO) | Admitting: Obstetrics and Gynecology

## 2016-11-02 ENCOUNTER — Other Ambulatory Visit: Payer: Self-pay | Admitting: Obstetrics and Gynecology

## 2016-11-02 NOTE — Telephone Encounter (Signed)
See message for refill request

## 2016-12-15 ENCOUNTER — Encounter: Payer: Self-pay | Admitting: Obstetrics and Gynecology

## 2016-12-15 ENCOUNTER — Ambulatory Visit (INDEPENDENT_AMBULATORY_CARE_PROVIDER_SITE_OTHER): Payer: 59 | Admitting: Obstetrics and Gynecology

## 2016-12-15 VITALS — BP 102/74 | HR 88 | Ht 64.0 in | Wt 142.3 lb

## 2016-12-15 DIAGNOSIS — Z01419 Encounter for gynecological examination (general) (routine) without abnormal findings: Secondary | ICD-10-CM

## 2016-12-15 MED ORDER — VITAMIN D (ERGOCALCIFEROL) 1.25 MG (50000 UNIT) PO CAPS: 50000.0000 [IU] | ORAL_CAPSULE | ORAL | 2 refills | Status: DC

## 2016-12-15 MED ORDER — ALPRAZOLAM 1 MG PO TABS: 1.0000 mg | ORAL_TABLET | Freq: Two times a day (BID) | ORAL | 3 refills | Status: DC | PRN

## 2016-12-15 NOTE — Progress Notes (Signed)
Subjective:   Katelyn Hobbs is a 41 y.o. G64P0 Caucasian female here for a routine well-woman exam.  No LMP recorded. Patient has had a hysterectomy.    Current complaints: none PCP: Ronnald Ramp       does desire labs  Social History: Sexual: heterosexual Marital Status: divorced Living situation: with family Occupation: CMA Tobacco/alcohol: no tobacco use Illicit drugs: no history of illicit drug use  The following portions of the patient's history were reviewed and updated as appropriate: allergies, current medications, past family history, past medical history, past social history, past surgical history and problem list.  Past Medical History Past Medical History:  Diagnosis Date  . Abnormal uterine bleeding   . Anemia   . Anxiety   . Bacterial vaginosis   . Endometriosis   . Migraine   . PCOS (polycystic ovarian syndrome)     Past Surgical History Past Surgical History:  Procedure Laterality Date  . LAPAROSCOPY  1993  . SALPINGECTOMY Bilateral 2015  . VAGINAL HYSTERECTOMY      Gynecologic History G0P0  No LMP recorded. Patient has had a hysterectomy. Contraception: abstinence Last Pap: 2015. Results were: normal Last mammogram: 2013. Results were: normal   Obstetric History OB History  Gravida Para Term Preterm AB Living  0            SAB TAB Ectopic Multiple Live Births                   Current Medications Current Outpatient Prescriptions on File Prior to Visit  Medication Sig Dispense Refill  . ALPRAZolam (XANAX) 1 MG tablet TAKE 1 TABLET BY MOUTH TWICE DAILY AS NEEDED FOR ANXIETY 45 tablet 0  . citalopram (CELEXA) 20 MG tablet TAKE 1 TABLET BY MOUTH DAILY 30 tablet 3  . cyanocobalamin (,VITAMIN B-12,) 1000 MCG/ML injection Inject 1 mL (1,000 mcg total) into the muscle every 30 (thirty) days. 3 mL 3  . phentermine (ADIPEX-P) 37.5 MG tablet TAKE 1 TABLET BY MOUTH EVERY DAY 30 tablet 0  . Vitamin D, Ergocalciferol, (DRISDOL) 50000 UNITS CAPS capsule Take  1 capsule (50,000 Units total) by mouth 2 (two) times a week. 30 capsule 2  . ciprofloxacin (CIPRO) 250 MG tablet Take 1 tablet (250 mg total) by mouth 2 (two) times daily. (Patient not taking: Reported on 12/15/2016) 14 tablet 0   No current facility-administered medications on file prior to visit.     Review of Systems Patient denies any headaches, blurred vision, shortness of breath, chest pain, abdominal pain, problems with bowel movements, urination, or intercourse.  Objective:  BP 102/74   Pulse 88   Ht 5\' 4"  (1.626 m)   Wt 142 lb 4.8 oz (64.5 kg)   BMI 24.43 kg/m  Physical Exam  General:  Well developed, well nourished, no acute distress. She is alert and oriented x3. Skin:  Warm and dry Neck:  Midline trachea, no thyromegaly or nodules Cardiovascular: Regular rate and rhythm, no murmur heard Lungs:  Effort normal, all lung fields clear to auscultation bilaterally Breasts:  No dominant palpable mass, retraction, or nipple discharge Abdomen:  Soft, non tender, no hepatosplenomegaly or masses Pelvic:  External genitalia is normal in appearance.  The vagina is normal in appearance. The cervix is surgically absent.  Thin prep pap is not done. Uterus is surgically absent. No adnexal masses or tenderness noted. Extremities:  No swelling or varicosities noted Psych:  She has a normal mood and affect  Assessment:   Healthy well-woman  exam Vitamin d deficiency  Plan:  Labs obtained F/U 1 year for AE, or sooner if needed Mammogram ordered  Beni Turrell Rockney Ghee, CNM

## 2016-12-15 NOTE — Patient Instructions (Signed)
 Preventive Care 18-39 Years, Female Preventive care refers to lifestyle choices and visits with your health care provider that can promote health and wellness. What does preventive care include?  A yearly physical exam. This is also called an annual well check.  Dental exams once or twice a year.  Routine eye exams. Ask your health care provider how often you should have your eyes checked.  Personal lifestyle choices, including:  Daily care of your teeth and gums.  Regular physical activity.  Eating a healthy diet.  Avoiding tobacco and drug use.  Limiting alcohol use.  Practicing safe sex.  Taking vitamin and mineral supplements as recommended by your health care provider. What happens during an annual well check? The services and screenings done by your health care provider during your annual well check will depend on your age, overall health, lifestyle risk factors, and family history of disease. Counseling  Your health care provider may ask you questions about your:  Alcohol use.  Tobacco use.  Drug use.  Emotional well-being.  Home and relationship well-being.  Sexual activity.  Eating habits.  Work and work environment.  Method of birth control.  Menstrual cycle.  Pregnancy history. Screening  You may have the following tests or measurements:  Height, weight, and BMI.  Diabetes screening. This is done by checking your blood sugar (glucose) after you have not eaten for a while (fasting).  Blood pressure.  Lipid and cholesterol levels. These may be checked every 5 years starting at age 20.  Skin check.  Hepatitis C blood test.  Hepatitis B blood test.  Sexually transmitted disease (STD) testing.  BRCA-related cancer screening. This may be done if you have a family history of breast, ovarian, tubal, or peritoneal cancers.  Pelvic exam and Pap test. This may be done every 3 years starting at age 21. Starting at age 30, this may be done  every 5 years if you have a Pap test in combination with an HPV test. Discuss your test results, treatment options, and if necessary, the need for more tests with your health care provider. Vaccines  Your health care provider may recommend certain vaccines, such as:  Influenza vaccine. This is recommended every year.  Tetanus, diphtheria, and acellular pertussis (Tdap, Td) vaccine. You may need a Td booster every 10 years.  Varicella vaccine. You may need this if you have not been vaccinated.  HPV vaccine. If you are 26 or younger, you may need three doses over 6 months.  Measles, mumps, and rubella (MMR) vaccine. You may need at least one dose of MMR. You may also need a second dose.  Pneumococcal 13-valent conjugate (PCV13) vaccine. You may need this if you have certain conditions and were not previously vaccinated.  Pneumococcal polysaccharide (PPSV23) vaccine. You may need one or two doses if you smoke cigarettes or if you have certain conditions.  Meningococcal vaccine. One dose is recommended if you are age 19-21 years and a first-year college student living in a residence hall, or if you have one of several medical conditions. You may also need additional booster doses.  Hepatitis A vaccine. You may need this if you have certain conditions or if you travel or work in places where you may be exposed to hepatitis A.  Hepatitis B vaccine. You may need this if you have certain conditions or if you travel or work in places where you may be exposed to hepatitis B.  Haemophilus influenzae type b (Hib) vaccine. You may need   this if you have certain risk factors. Talk to your health care provider about which screenings and vaccines you need and how often you need them. This information is not intended to replace advice given to you by your health care provider. Make sure you discuss any questions you have with your health care provider. Document Released: 12/08/2001 Document Revised:  07/01/2016 Document Reviewed: 08/13/2015 Elsevier Interactive Patient Education  2017 Elsevier Inc.  

## 2016-12-16 ENCOUNTER — Other Ambulatory Visit: Payer: Self-pay | Admitting: Obstetrics and Gynecology

## 2016-12-16 LAB — COMPREHENSIVE METABOLIC PANEL
ALBUMIN: 5 g/dL (ref 3.5–5.5)
ALK PHOS: 71 IU/L (ref 39–117)
ALT: 33 IU/L — ABNORMAL HIGH (ref 0–32)
AST: 38 IU/L (ref 0–40)
Albumin/Globulin Ratio: 1.8 (ref 1.2–2.2)
BUN / CREAT RATIO: 10 (ref 9–23)
BUN: 6 mg/dL (ref 6–24)
Bilirubin Total: 0.4 mg/dL (ref 0.0–1.2)
CALCIUM: 9.8 mg/dL (ref 8.7–10.2)
CO2: 25 mmol/L (ref 18–29)
CREATININE: 0.59 mg/dL (ref 0.57–1.00)
Chloride: 95 mmol/L — ABNORMAL LOW (ref 96–106)
GFR, EST AFRICAN AMERICAN: 131 (ref >59)
GFR, EST NON AFRICAN AMERICAN: 113 (ref >59)
GLUCOSE: 92 mg/dL (ref 65–99)
Globulin, Total: 2.8 (ref 1.5–4.5)
Potassium: 4.2 mmol/L (ref 3.5–5.2)
Sodium: 139 mmol/L (ref 134–144)
TOTAL PROTEIN: 7.8 g/dL (ref 6.0–8.5)

## 2016-12-16 LAB — VITAMIN D 25 HYDROXY (VIT D DEFICIENCY, FRACTURES): VIT D 25 HYDROXY: 11.6 ng/mL — AB (ref 30.0–100.0)

## 2016-12-16 LAB — LIPID PANEL
Chol/HDL Ratio: 2.9 (ref 0.0–4.4)
Cholesterol, Total: 232 mg/dL — ABNORMAL HIGH (ref 100–199)
HDL: 79 mg/dL (ref >39)
LDL Calculated: 136 — ABNORMAL HIGH (ref 0–99)
Triglycerides: 83 mg/dL (ref 0–149)
VLDL Cholesterol Cal: 17 (ref 5–40)

## 2016-12-16 MED ORDER — VITAMIN D-3 125 MCG (5000 UT) PO TABS: 2.0000 | ORAL_TABLET | Freq: Every day | ORAL | 5 refills | Status: DC

## 2016-12-29 ENCOUNTER — Other Ambulatory Visit: Payer: Self-pay | Admitting: Obstetrics and Gynecology

## 2017-02-12 ENCOUNTER — Other Ambulatory Visit: Payer: Self-pay | Admitting: Obstetrics and Gynecology

## 2017-03-16 ENCOUNTER — Ambulatory Visit: Payer: Commercial Managed Care - HMO

## 2017-03-23 ENCOUNTER — Other Ambulatory Visit: Payer: Self-pay | Admitting: Obstetrics and Gynecology

## 2017-03-23 ENCOUNTER — Ambulatory Visit
Admission: RE | Admit: 2017-03-23 | Discharge: 2017-03-23 | Disposition: A | Discharge status: Home / Self Care | Payer: Commercial Managed Care - HMO | Source: Ambulatory Visit | Attending: Obstetrics and Gynecology | Admitting: Obstetrics and Gynecology

## 2017-03-23 DIAGNOSIS — Z1231 Encounter for screening mammogram for malignant neoplasm of breast: Secondary | ICD-10-CM | POA: Diagnosis present

## 2017-03-23 DIAGNOSIS — Z01419 Encounter for gynecological examination (general) (routine) without abnormal findings: Secondary | ICD-10-CM | POA: Insufficient documentation

## 2017-03-23 IMAGING — MG MM DIGITAL SCREENING BILAT W/ CAD
5 series · 5 of 5 positions shown · non-contrast
Comparison: Previous exam(s).

CLINICAL DATA: Screening.

EXAM:
DIGITAL SCREENING BILATERAL MAMMOGRAM WITH CAD

[R MLO (1 of 2)]
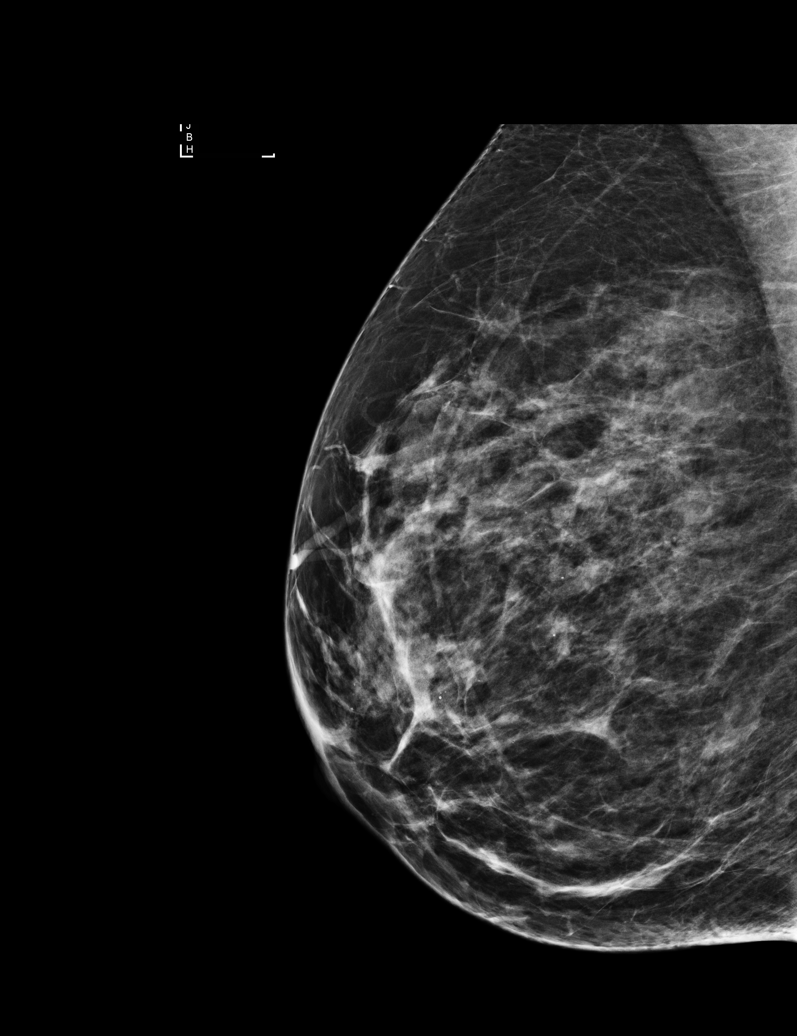

[L CC]
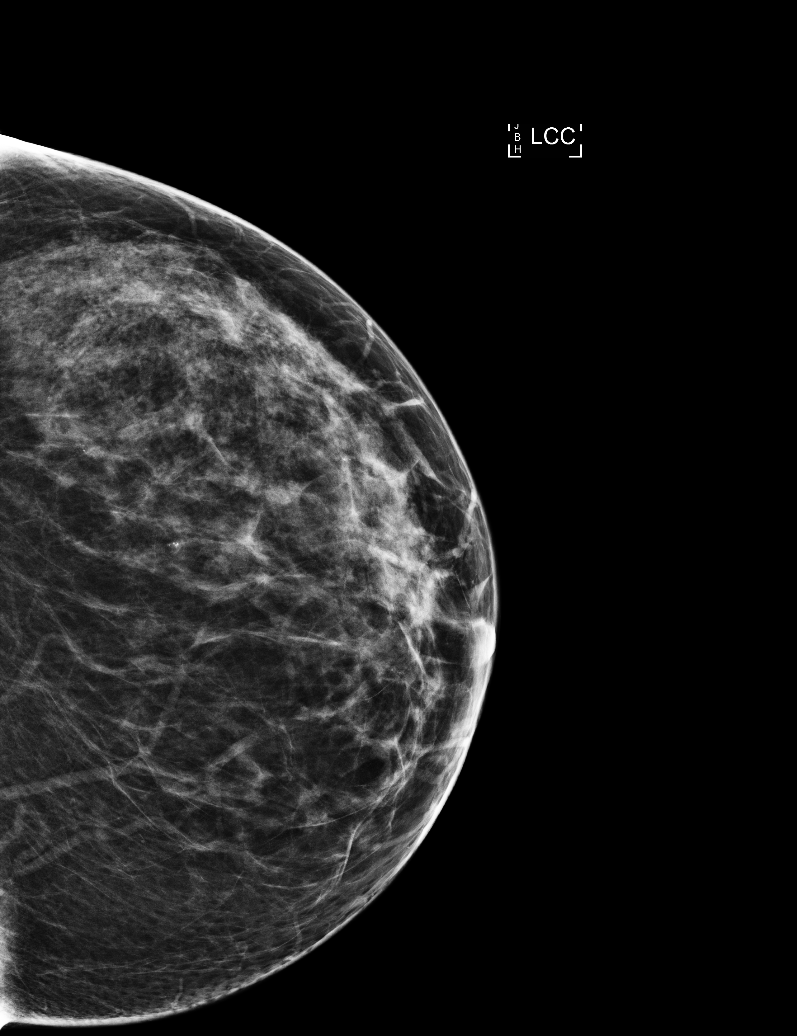

[R CC]
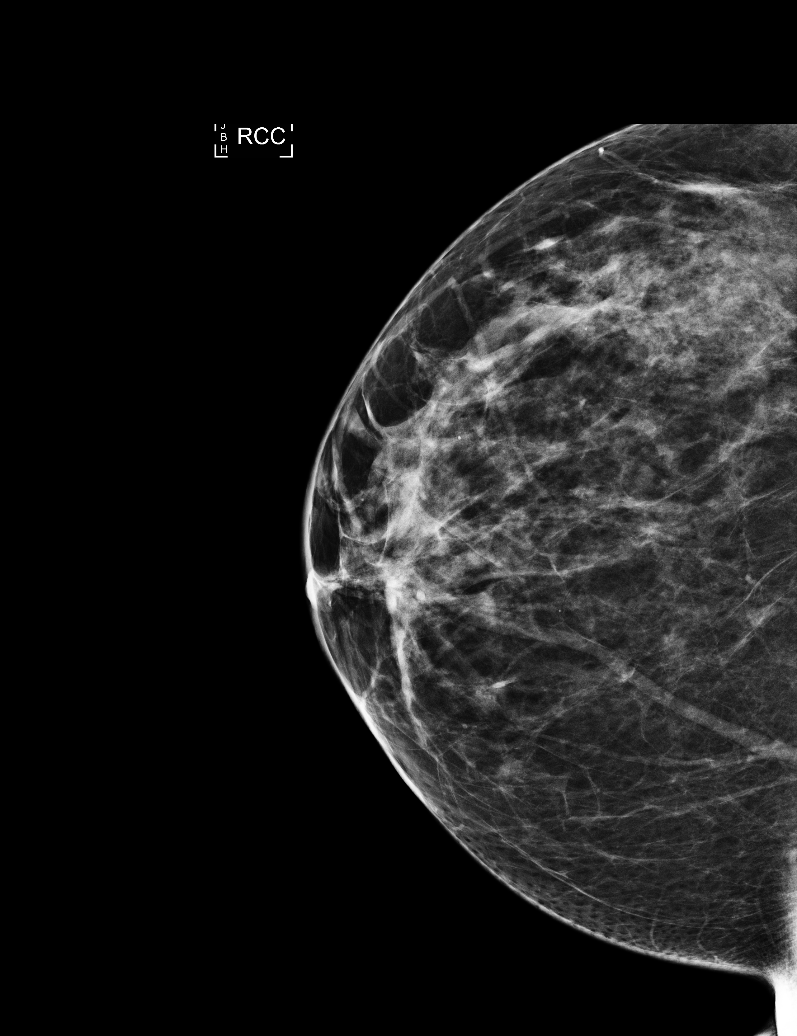

[R MLO (2 of 2)]
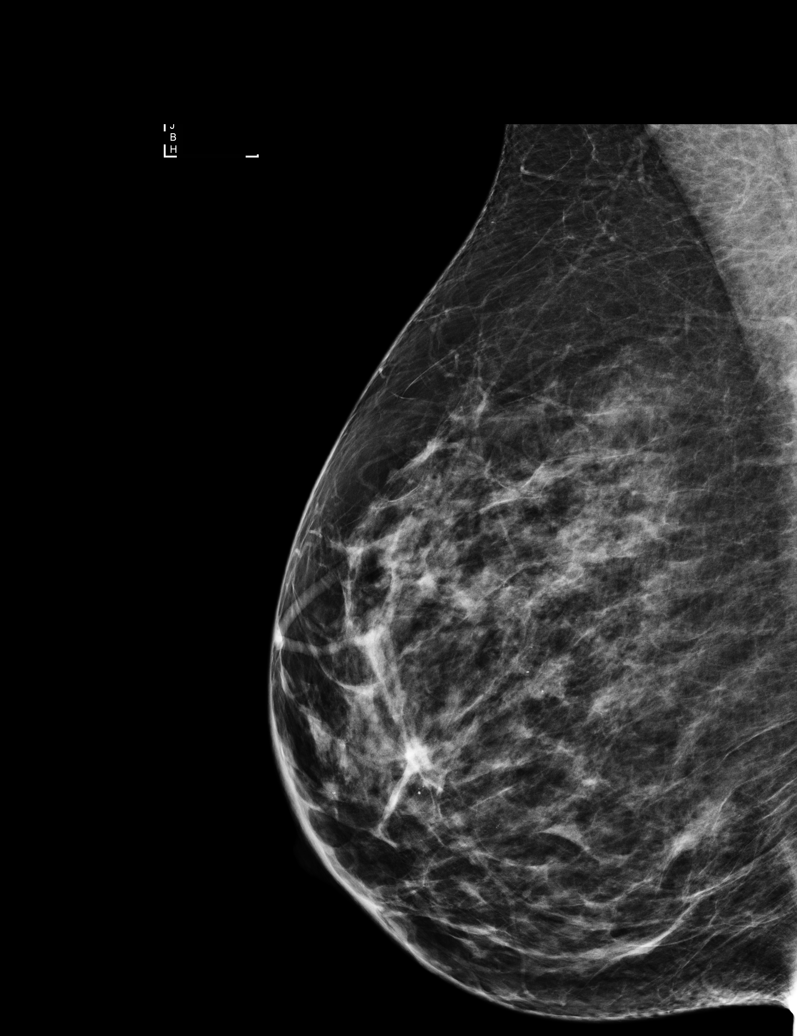

[L MLO]
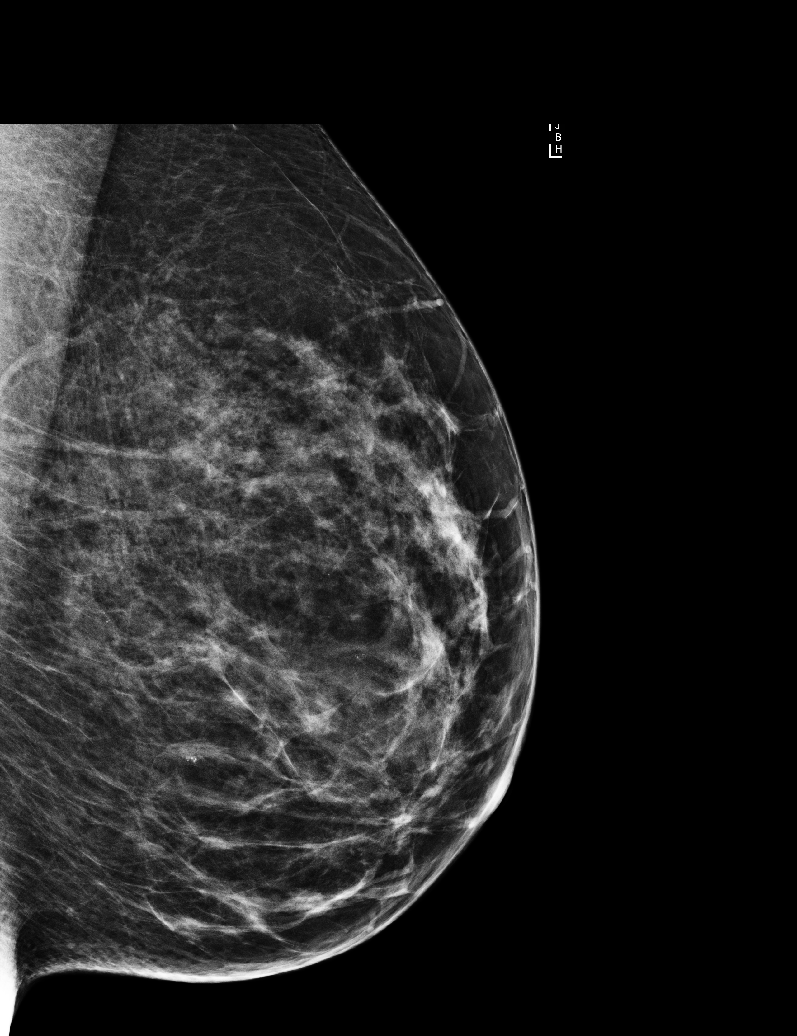

[5 of 5 positions shown; findings below may reference images not displayed]

ACR Breast Density Category c: The breast tissue is heterogeneously
dense, which may obscure small masses.
FINDINGS: There are no findings suspicious for malignancy. Images were
processed with CAD.
IMPRESSION: No mammographic evidence of malignancy. A result letter of this
screening mammogram will be mailed directly to the patient.

RECOMMENDATION:
Screening mammogram in one year. (Code:[0J])

BI-RADS CATEGORY  1: Negative.

## 2017-04-18 ENCOUNTER — Other Ambulatory Visit: Payer: Self-pay | Admitting: Obstetrics and Gynecology

## 2017-05-18 ENCOUNTER — Encounter: Payer: Self-pay | Admitting: Family Medicine

## 2017-05-18 ENCOUNTER — Ambulatory Visit (INDEPENDENT_AMBULATORY_CARE_PROVIDER_SITE_OTHER): Payer: 59 | Admitting: Family Medicine

## 2017-05-18 VITALS — BP 120/62 | HR 68 | Ht 64.0 in | Wt 150.0 lb

## 2017-05-18 DIAGNOSIS — F4321 Adjustment disorder with depressed mood: Secondary | ICD-10-CM

## 2017-05-18 DIAGNOSIS — F900 Attention-deficit hyperactivity disorder, predominantly inattentive type: Secondary | ICD-10-CM | POA: Diagnosis not present

## 2017-05-18 MED ORDER — AMPHETAMINE-DEXTROAMPHETAMINE 20 MG PO TABS: 20.0000 mg | ORAL_TABLET | Freq: Two times a day (BID) | ORAL | 0 refills | Status: DC

## 2017-05-18 MED ORDER — CITALOPRAM HYDROBROMIDE 40 MG PO TABS: 40.0000 mg | ORAL_TABLET | Freq: Every day | ORAL | 5 refills | Status: DC

## 2017-05-18 NOTE — Progress Notes (Signed)
Name: Katelyn Hobbs   MRN: [id number]    DOB: [birth year]   Date:05/18/2017       Progress Note  Subjective  Chief Complaint  Chief Complaint  Patient presents with  . ADHD    referral?    Depression         This is a new problem.  The current episode started more than 1 month ago.   The onset quality is sudden.   The problem occurs intermittently.  The problem has been gradually improving since onset.  Associated symptoms include helplessness, hopelessness, irritable, decreased interest and sad.  Associated symptoms include no decreased concentration, no fatigue, does not have insomnia, no restlessness, no appetite change, no body aches, no myalgias, no headaches, no indigestion and no suicidal ideas.  Past treatments include SSRIs - Selective serotonin reuptake inhibitors.  Compliance with treatment is good.  Past compliance problems include difficulty understanding directions.  Previous treatment provided mild relief.  Risk factors include marital problems and major life event.    No problem-specific Assessment & Plan notes found for this encounter.   Past Medical History:  Diagnosis Date  . Abnormal uterine bleeding   . Anemia   . Anxiety   . Bacterial vaginosis   . Endometriosis   . Migraine   . PCOS (polycystic ovarian syndrome)     Past Surgical History:  Procedure Laterality Date  . LAPAROSCOPY  1993  . SALPINGECTOMY Bilateral 2015  . VAGINAL HYSTERECTOMY      Family History  Problem Relation Age of Onset  . Cancer Maternal Grandmother        breast  . Heart disease Maternal Grandmother   . Breast cancer Maternal Grandmother   . Heart disease Paternal Grandmother   . Cancer Paternal Grandfather        colon  . Diabetes Paternal Grandfather     Social History   Social History  . Marital status: Single    Spouse name: N/A  . Number of children: N/A  . Years of education: N/A   Occupational History  . Not on file.   Social History Main Topics  .  Smoking status: Former Research scientist (life sciences)  . Smokeless tobacco: Never Used  . Alcohol use 0.0 oz/week     Comment: occas  . Drug use: No  . Sexual activity: Yes    Birth control/ protection: Surgical   Other Topics Concern  . Not on file   Social History Narrative  . No narrative on file    No Known Allergies  Outpatient Medications Prior to Visit  Medication Sig Dispense Refill  . ALPRAZolam (XANAX) 1 MG tablet TAKE 1 TABLET BY MOUTH TWICE DAILY AS NEEDED FOR ANXIETY (Patient taking differently: TAKE 1 TABLET BY MOUTH TWICE DAILY AS NEEDED FOR ANXIETY/Melody Burr) 60 tablet 1  . Cholecalciferol (VITAMIN D-3) 5000 units TABS Take 2 tablets by mouth daily. 120 tablet 5  . cyanocobalamin (,VITAMIN B-12,) 1000 MCG/ML injection Inject 1 mL (1,000 mcg total) into the muscle every 30 (thirty) days. 3 mL 3  . citalopram (CELEXA) 20 MG tablet TAKE 1 TABLET BY MOUTH DAILY 30 tablet 6  . ciprofloxacin (CIPRO) 250 MG tablet Take 1 tablet (250 mg total) by mouth 2 (two) times daily. (Patient not taking: Reported on 12/15/2016) 14 tablet 0  . phentermine (ADIPEX-P) 37.5 MG tablet TAKE 1 TABLET BY MOUTH EVERY DAY 30 tablet 0   No facility-administered medications prior to visit.     Review of Systems  Constitutional: Negative for appetite change, chills, fatigue, fever, malaise/fatigue and weight loss.  HENT: Negative for ear discharge, ear pain and sore throat.   Eyes: Negative for blurred vision.  Respiratory: Negative for cough, sputum production, shortness of breath and wheezing.   Cardiovascular: Negative for chest pain, palpitations and leg swelling.  Gastrointestinal: Negative for abdominal pain, blood in stool, constipation, diarrhea, heartburn, melena and nausea.  Genitourinary: Negative for dysuria, frequency, hematuria and urgency.  Musculoskeletal: Negative for back pain, joint pain, myalgias and neck pain.  Skin: Negative for rash.  Neurological: Negative for dizziness, tingling, sensory  change, focal weakness and headaches.  Endo/Heme/Allergies: Negative for environmental allergies and polydipsia. Does not bruise/bleed easily.  Psychiatric/Behavioral: Negative for decreased concentration, depression and suicidal ideas. The patient is not nervous/anxious and does not have insomnia.      Objective  Vitals:   05/18/17 1418  BP: 120/62  Pulse: 68  Weight: 150 lb (68 kg)  Height: 5\' 4"  (1.626 m)    Physical Exam  Constitutional: She is well-developed, well-nourished, and in no distress. She is irritable. No distress.  HENT:  Head: Normocephalic and atraumatic.  Right Ear: External ear normal.  Left Ear: External ear normal.  Nose: Nose normal.  Mouth/Throat: Oropharynx is clear and moist.  Eyes: Pupils are equal, round, and reactive to light. Conjunctivae and EOM are normal. Right eye exhibits no discharge. Left eye exhibits no discharge.  Neck: Normal range of motion. Neck supple. No JVD present. No thyromegaly present.  Cardiovascular: Normal rate, regular rhythm, normal heart sounds and intact distal pulses.  Exam reveals no gallop and no friction rub.   No murmur heard. Pulmonary/Chest: Effort normal and breath sounds normal. She has no wheezes. She has no rales.  Abdominal: Soft. Bowel sounds are normal. She exhibits no mass. There is no tenderness. There is no guarding.  Musculoskeletal: Normal range of motion. She exhibits no edema.  Lymphadenopathy:    She has no cervical adenopathy.  Neurological: She is alert. She has normal reflexes.  Skin: Skin is warm and dry. She is not diaphoretic.  Psychiatric: Mood and affect normal.  Nursing note and vitals reviewed.     Assessment & Plan  Problem List Items Addressed This Visit    None    Visit Diagnoses    Attention deficit hyperactivity disorder (ADHD), predominantly inattentive type    -  Primary   employment has changed to more clerical/focus demanding responsibilities   Relevant Medications    amphetamine-dextroamphetamine (ADDERALL) 20 MG tablet   Situational depression       Relevant Medications   citalopram (CELEXA) 40 MG tablet      Meds ordered this encounter  Medications  . citalopram (CELEXA) 40 MG tablet    Sig: Take 1 tablet (40 mg total) by mouth daily.    Dispense:  30 tablet    Refill:  5  . amphetamine-dextroamphetamine (ADDERALL) 20 MG tablet    Sig: Take 1 tablet (20 mg total) by mouth 2 (two) times daily.    Dispense:  60 tablet    Refill:  0      Dr. Otilio [facility]  05/18/17

## 2017-06-01 ENCOUNTER — Other Ambulatory Visit: Payer: Self-pay

## 2017-06-01 ENCOUNTER — Ambulatory Visit (INDEPENDENT_AMBULATORY_CARE_PROVIDER_SITE_OTHER): Payer: 59

## 2017-06-01 DIAGNOSIS — Z23 Encounter for immunization: Secondary | ICD-10-CM

## 2017-06-15 ENCOUNTER — Other Ambulatory Visit: Payer: Self-pay | Admitting: Obstetrics and Gynecology

## 2017-07-21 ENCOUNTER — Other Ambulatory Visit: Payer: Self-pay

## 2017-07-21 ENCOUNTER — Telehealth: Payer: Self-pay

## 2017-07-21 DIAGNOSIS — F900 Attention-deficit hyperactivity disorder, predominantly inattentive type: Secondary | ICD-10-CM

## 2017-07-21 MED ORDER — AMPHETAMINE-DEXTROAMPHETAMINE 20 MG PO TABS: 20.0000 mg | ORAL_TABLET | Freq: Two times a day (BID) | ORAL | 0 refills | Status: DC

## 2017-07-21 NOTE — Telephone Encounter (Signed)
Pt called and wanted refill on Adderall- printed and left up front- psychiatry?

## 2017-10-01 ENCOUNTER — Other Ambulatory Visit: Payer: Self-pay

## 2017-10-01 ENCOUNTER — Telehealth: Payer: Self-pay

## 2017-10-01 DIAGNOSIS — F900 Attention-deficit hyperactivity disorder, predominantly inattentive type: Secondary | ICD-10-CM

## 2017-10-01 MED ORDER — AMPHETAMINE-DEXTROAMPHETAMINE 20 MG PO TABS: 20.0000 mg | ORAL_TABLET | Freq: Two times a day (BID) | ORAL | 0 refills | Status: DC

## 2017-10-01 NOTE — Telephone Encounter (Signed)
Pt called wanting Adderall refill- filled, but advised need to get in with [facility]/ psychiatry for further refills

## 2017-10-06 ENCOUNTER — Other Ambulatory Visit: Payer: Self-pay | Admitting: Obstetrics and Gynecology

## 2017-10-07 ENCOUNTER — Encounter: Payer: Self-pay | Admitting: Obstetrics and Gynecology

## 2017-10-08 ENCOUNTER — Telehealth: Payer: Self-pay | Admitting: Obstetrics and Gynecology

## 2017-10-08 ENCOUNTER — Encounter: Payer: Self-pay | Admitting: Obstetrics and Gynecology

## 2017-10-12 ENCOUNTER — Other Ambulatory Visit: Payer: Self-pay | Admitting: Obstetrics and Gynecology

## 2017-10-12 NOTE — Telephone Encounter (Signed)
pls advise

## 2017-11-04 ENCOUNTER — Other Ambulatory Visit: Payer: Self-pay | Admitting: Family Medicine

## 2017-11-04 DIAGNOSIS — F4321 Adjustment disorder with depressed mood: Secondary | ICD-10-CM

## 2017-12-03 ENCOUNTER — Other Ambulatory Visit: Payer: Self-pay | Admitting: Family Medicine

## 2017-12-03 DIAGNOSIS — F4321 Adjustment disorder with depressed mood: Secondary | ICD-10-CM

## 2017-12-21 ENCOUNTER — Encounter: Payer: 59 | Admitting: Obstetrics and Gynecology

## 2017-12-28 ENCOUNTER — Other Ambulatory Visit: Payer: Self-pay | Admitting: Family Medicine

## 2017-12-28 ENCOUNTER — Other Ambulatory Visit: Payer: Self-pay | Admitting: Obstetrics and Gynecology

## 2017-12-28 DIAGNOSIS — F4321 Adjustment disorder with depressed mood: Secondary | ICD-10-CM

## 2017-12-30 ENCOUNTER — Encounter: Payer: Self-pay | Admitting: *Deleted

## 2018-01-13 NOTE — Telephone Encounter (Signed)
Error

## 2018-01-27 ENCOUNTER — Other Ambulatory Visit: Payer: Self-pay | Admitting: Family Medicine

## 2018-01-27 DIAGNOSIS — F4321 Adjustment disorder with depressed mood: Secondary | ICD-10-CM

## 2018-02-23 ENCOUNTER — Other Ambulatory Visit: Payer: Self-pay | Admitting: Family Medicine

## 2018-02-23 DIAGNOSIS — F4321 Adjustment disorder with depressed mood: Secondary | ICD-10-CM

## 2018-03-21 ENCOUNTER — Other Ambulatory Visit: Payer: Self-pay | Admitting: Obstetrics and Gynecology

## 2018-03-21 ENCOUNTER — Other Ambulatory Visit: Payer: Self-pay | Admitting: Family Medicine

## 2018-03-21 DIAGNOSIS — F4321 Adjustment disorder with depressed mood: Secondary | ICD-10-CM

## 2018-03-31 ENCOUNTER — Encounter: Payer: Self-pay | Admitting: *Deleted

## 2018-04-05 ENCOUNTER — Encounter: Payer: 59 | Admitting: Obstetrics and Gynecology

## 2018-04-20 ENCOUNTER — Other Ambulatory Visit: Payer: Self-pay | Admitting: Family Medicine

## 2018-04-20 DIAGNOSIS — F4321 Adjustment disorder with depressed mood: Secondary | ICD-10-CM

## 2018-05-05 ENCOUNTER — Other Ambulatory Visit: Payer: Self-pay | Admitting: *Deleted

## 2018-05-05 ENCOUNTER — Telehealth: Payer: Self-pay | Admitting: Obstetrics and Gynecology

## 2018-05-05 DIAGNOSIS — F4321 Adjustment disorder with depressed mood: Secondary | ICD-10-CM

## 2018-05-05 MED ORDER — CITALOPRAM HYDROBROMIDE 40 MG PO TABS: 40.0000 mg | ORAL_TABLET | Freq: Every day | ORAL | 3 refills | Status: DC

## 2018-05-05 NOTE — Telephone Encounter (Signed)
Patient called requesting a refill on citalopram just until she can  Get in for her upcoming appointment on the 23rd. [facility] uses the [facility] on [address]. Thanks

## 2018-05-05 NOTE — Telephone Encounter (Signed)
Done-ac 

## 2018-05-17 ENCOUNTER — Ambulatory Visit (INDEPENDENT_AMBULATORY_CARE_PROVIDER_SITE_OTHER): Payer: 59 | Admitting: Obstetrics and Gynecology

## 2018-05-17 ENCOUNTER — Encounter: Payer: Self-pay | Admitting: Obstetrics and Gynecology

## 2018-05-17 VITALS — BP 108/67 | HR 102 | Ht 64.0 in | Wt 181.1 lb

## 2018-05-17 DIAGNOSIS — E559 Vitamin D deficiency, unspecified: Secondary | ICD-10-CM | POA: Diagnosis not present

## 2018-05-17 DIAGNOSIS — E663 Overweight: Secondary | ICD-10-CM

## 2018-05-17 DIAGNOSIS — Z01419 Encounter for gynecological examination (general) (routine) without abnormal findings: Secondary | ICD-10-CM | POA: Diagnosis not present

## 2018-05-17 MED ORDER — CYANOCOBALAMIN 1000 MCG/ML IJ SOLN: 1000.0000 ug | INTRAMUSCULAR | 1 refills | Status: DC

## 2018-05-17 NOTE — Patient Instructions (Signed)
Preventive Care 18-39 Years, Female Preventive care refers to lifestyle choices and visits with your health care provider that can promote health and wellness. What does preventive care include?  A yearly physical exam. This is also called an annual well check.  Dental exams once or twice a year.  Routine eye exams. Ask your health care provider how often you should have your eyes checked.  Personal lifestyle choices, including: ? Daily care of your teeth and gums. ? Regular physical activity. ? Eating a healthy diet. ? Avoiding tobacco and drug use. ? Limiting alcohol use. ? Practicing safe sex. ? Taking vitamin and mineral supplements as recommended by your health care provider. What happens during an annual well check? The services and screenings done by your health care provider during your annual well check will depend on your age, overall health, lifestyle risk factors, and family history of disease. Counseling Your health care provider may ask you questions about your:  Alcohol use.  Tobacco use.  Drug use.  Emotional well-being.  Home and relationship well-being.  Sexual activity.  Eating habits.  Work and work Statistician.  Method of birth control.  Menstrual cycle.  Pregnancy history.  Screening You may have the following tests or measurements:  Height, weight, and BMI.  Diabetes screening. This is done by checking your blood sugar (glucose) after you have not eaten for a while (fasting).  Blood pressure.  Lipid and cholesterol levels. These may be checked every 5 years starting at age 38.  Skin check.  Hepatitis C blood test.  Hepatitis B blood test.  Sexually transmitted disease (STD) testing.  BRCA-related cancer screening. This may be done if you have a family history of breast, ovarian, tubal, or peritoneal cancers.  Pelvic exam and Pap test. This may be done every 3 years starting at age 38. Starting at age 30, this may be done  every 5 years if you have a Pap test in combination with an HPV test.  Discuss your test results, treatment options, and if necessary, the need for more tests with your health care provider. Vaccines Your health care provider may recommend certain vaccines, such as:  Influenza vaccine. This is recommended every year.  Tetanus, diphtheria, and acellular pertussis (Tdap, Td) vaccine. You may need a Td booster every 10 years.  Varicella vaccine. You may need this if you have not been vaccinated.  HPV vaccine. If you are 39 or younger, you may need three doses over 6 months.  Measles, mumps, and rubella (MMR) vaccine. You may need at least one dose of MMR. You may also need a second dose.  Pneumococcal 13-valent conjugate (PCV13) vaccine. You may need this if you have certain conditions and were not previously vaccinated.  Pneumococcal polysaccharide (PPSV23) vaccine. You may need one or two doses if you smoke cigarettes or if you have certain conditions.  Meningococcal vaccine. One dose is recommended if you are age 68-21 years and a first-year college student living in a residence hall, or if you have one of several medical conditions. You may also need additional booster doses.  Hepatitis A vaccine. You may need this if you have certain conditions or if you travel or work in places where you may be exposed to hepatitis A.  Hepatitis B vaccine. You may need this if you have certain conditions or if you travel or work in places where you may be exposed to hepatitis B.  Haemophilus influenzae type b (Hib) vaccine. You may need this  if you have certain risk factors.  Talk to your health care provider about which screenings and vaccines you need and how often you need them. This information is not intended to replace advice given to you by your health care provider. Make sure you discuss any questions you have with your health care provider. Document Released: 12/08/2001 Document Revised:  07/01/2016 Document Reviewed: 08/13/2015 Elsevier Interactive Patient Education  2018 Elsevier Inc.  

## 2018-05-17 NOTE — Progress Notes (Signed)
Subjective:   EYVA CALIFANO is a 42 y.o. G81P0 Caucasian female here for a routine well-woman exam.  No LMP recorded. Patient has had a hysterectomy.    Current complaints: none PCP: Ronnald Ramp       doesn't desire labs  Social History: Sexual: heterosexual Marital Status: divorced Living situation: with partner / significant other Occupation: CMA at [employer] Tobacco/alcohol: no tobacco use Illicit drugs: no history of illicit drug use  The following portions of the patient's history were reviewed and updated as appropriate: allergies, current medications, past family history, past medical history, past social history, past surgical history and problem list.  Past Medical History Past Medical History:  Diagnosis Date  . Abnormal uterine bleeding   . Anemia   . Anxiety   . Bacterial vaginosis   . Endometriosis   . Migraine   . PCOS (polycystic ovarian syndrome)     Past Surgical History Past Surgical History:  Procedure Laterality Date  . LAPAROSCOPY  1993  . SALPINGECTOMY Bilateral 2015  . VAGINAL HYSTERECTOMY      Gynecologic History G0P0  No LMP recorded. Patient has had a hysterectomy. Contraception: status post hysterectomy Last Pap: 2015. Results were: normal Last mammogram: 02/2017. Results were: normal   Obstetric History OB History  Gravida Para Term Preterm AB Living  0            SAB TAB Ectopic Multiple Live Births               Current Medications Current Outpatient Medications on File Prior to Visit  Medication Sig Dispense Refill  . ALPRAZolam (XANAX) 1 MG tablet TAKE 1 TABLET BY MOUTH TWICE DAILY 60 tablet 2  . citalopram (CELEXA) 40 MG tablet Take 1 tablet (40 mg total) by mouth daily. 30 tablet 3  . amphetamine-dextroamphetamine (ADDERALL) 20 MG tablet Take 1 tablet (20 mg total) by mouth 2 (two) times daily. (Patient not taking: Reported on 05/17/2018) 60 tablet 0  . Cholecalciferol (VITAMIN D-3) 5000 units TABS Take 2 tablets by mouth  daily. (Patient not taking: Reported on 05/17/2018) 120 tablet 5  . cyanocobalamin (,VITAMIN B-12,) 1000 MCG/ML injection Inject 1 mL (1,000 mcg total) into the muscle every 30 (thirty) days. (Patient not taking: Reported on 05/17/2018) 3 mL 3  . phentermine (ADIPEX-P) 37.5 MG tablet TAKE 1 TABLET BY MOUTH EVERY DAY (Patient not taking: Reported on 05/17/2018) 30 tablet 0   No current facility-administered medications on file prior to visit.     Review of Systems Patient denies any headaches, blurred vision, shortness of breath, chest pain, abdominal pain, problems with bowel movements, urination, or intercourse.  Objective:  BP 108/67   Pulse (!) 102   Ht 5\' 4"  (1.626 m)   Wt 181 lb 1.6 oz (82.1 kg)   BMI 31.09 kg/m  Physical Exam  General:  Well developed, well nourished, no acute distress. She is alert and oriented x3. Skin:  Warm and dry Neck:  Midline trachea, no thyromegaly or nodules Cardiovascular: Regular rate and rhythm, no murmur heard Lungs:  Effort normal, all lung fields clear to auscultation bilaterally Breasts:  No dominant palpable mass, retraction, or nipple discharge Abdomen:  Soft, non tender, no hepatosplenomegaly or masses Pelvic:  External genitalia is normal in appearance.  The vagina is normal in appearance. The cervix is bulbous, no CMT.  Thin prep pap is done with HR HPV cotesting. Uterus is felt to be normal size, shape, and contour.  No adnexal masses or tenderness  noted. Extremities:  No swelling or varicosities noted Psych:  She has a normal mood and affect  Assessment:   Healthy well-woman exam Overweight Binge eater  Plan:  Labs obtained-will follow up accordingly. Restart phentermine F/U 1 year for AE, or sooner if needed Mammogram ordered  Aamari [place] Ghee, CNM

## 2018-05-18 LAB — LIPID PANEL
CHOLESTEROL TOTAL: 248 mg/dL — AB (ref 100–199)
Chol/HDL Ratio: 4 ratio (ref 0.0–4.4)
HDL: 62 mg/dL (ref >39)
LDL CALC: 121 mg/dL — AB (ref 0–99)
TRIGLYCERIDES: 325 mg/dL — AB (ref 0–149)
VLDL Cholesterol Cal: 65 mg/dL — ABNORMAL HIGH (ref 5–40)

## 2018-05-18 LAB — CBC
HEMATOCRIT: 40.7 % (ref 34.0–46.6)
HEMOGLOBIN: 14 g/dL (ref 11.1–15.9)
MCH: 32.6 pg (ref 26.6–33.0)
MCHC: 34.4 g/dL (ref 31.5–35.7)
MCV: 95 fL (ref 79–97)
Platelets: 222 10*3/uL (ref 150–450)
RBC: 4.3 x10E6/uL (ref 3.77–5.28)
RDW: 13.5 % (ref 12.3–15.4)
WBC: 7.4 10*3/uL (ref 3.4–10.8)

## 2018-05-18 LAB — COMPREHENSIVE METABOLIC PANEL
A/G RATIO: 1.6 (ref 1.2–2.2)
ALT: 37 IU/L — AB (ref 0–32)
AST: 30 IU/L (ref 0–40)
Albumin: 4.6 g/dL (ref 3.5–5.5)
Alkaline Phosphatase: 71 IU/L (ref 39–117)
BUN/Creatinine Ratio: 10 (ref 9–23)
BUN: 7 mg/dL (ref 6–24)
Bilirubin Total: <0.2 mg/dL (ref 0.0–1.2)
CALCIUM: 9.8 mg/dL (ref 8.7–10.2)
CO2: 26 mmol/L (ref 20–29)
CREATININE: 0.73 mg/dL (ref 0.57–1.00)
Chloride: 101 mmol/L (ref 96–106)
GFR, EST AFRICAN AMERICAN: 117 mL/min/{1.73_m2} (ref >59)
GFR, EST NON AFRICAN AMERICAN: 101 mL/min/{1.73_m2} (ref >59)
GLOBULIN, TOTAL: 2.9 g/dL (ref 1.5–4.5)
Glucose: 90 mg/dL (ref 65–99)
Potassium: 3.8 mmol/L (ref 3.5–5.2)
SODIUM: 141 mmol/L (ref 134–144)
TOTAL PROTEIN: 7.5 g/dL (ref 6.0–8.5)

## 2018-05-18 LAB — THYROID PANEL WITH TSH
Free Thyroxine Index: 1.5 (ref 1.2–4.9)
T3 UPTAKE RATIO: 24 % (ref 24–39)
T4, Total: 6.4 ug/dL (ref 4.5–12.0)
TSH: 1.97 u[IU]/mL (ref 0.450–4.500)

## 2018-05-18 LAB — VITAMIN D 25 HYDROXY (VIT D DEFICIENCY, FRACTURES): VIT D 25 HYDROXY: 12 ng/mL — AB (ref 30.0–100.0)

## 2018-05-18 LAB — HEMOGLOBIN A1C
ESTIMATED AVERAGE GLUCOSE: 100 mg/dL
Hgb A1c MFr Bld: 5.1 % (ref 4.8–5.6)

## 2018-05-18 LAB — FERRITIN: Ferritin: 149 ng/mL (ref 15–150)

## 2018-05-19 ENCOUNTER — Other Ambulatory Visit: Payer: Self-pay | Admitting: Obstetrics and Gynecology

## 2018-05-19 MED ORDER — ASPIRIN 81 MG PO CHEW: 81.0000 mg | CHEWABLE_TABLET | Freq: Every day | ORAL | 4 refills | Status: DC

## 2018-05-19 MED ORDER — ERGOCALCIFEROL 62.5 MCG (2500 UT) PO CAPS: 10000.0000 mg | ORAL_CAPSULE | Freq: Every day | ORAL | 6 refills | Status: DC

## 2018-05-20 LAB — CYTOLOGY - PAP

## 2018-06-01 ENCOUNTER — Encounter: Payer: Self-pay | Admitting: Obstetrics and Gynecology

## 2018-06-01 ENCOUNTER — Other Ambulatory Visit: Payer: Self-pay | Admitting: *Deleted

## 2018-06-01 MED ORDER — AZITHROMYCIN 250 MG PO TABS: 250.0000 mg | ORAL_TABLET | Freq: Every day | ORAL | 0 refills | Status: DC

## 2018-06-14 ENCOUNTER — Ambulatory Visit
Admission: RE | Admit: 2018-06-14 | Discharge: 2018-06-14 | Disposition: A | Discharge status: Home / Self Care | Payer: Commercial Managed Care - HMO | Source: Ambulatory Visit | Attending: Obstetrics and Gynecology | Admitting: Obstetrics and Gynecology

## 2018-06-14 ENCOUNTER — Other Ambulatory Visit: Payer: Self-pay | Admitting: *Deleted

## 2018-06-14 ENCOUNTER — Encounter: Payer: Self-pay | Admitting: *Deleted

## 2018-06-14 DIAGNOSIS — Z1231 Encounter for screening mammogram for malignant neoplasm of breast: Secondary | ICD-10-CM

## 2018-06-14 DIAGNOSIS — Z01419 Encounter for gynecological examination (general) (routine) without abnormal findings: Secondary | ICD-10-CM | POA: Diagnosis present

## 2018-06-14 IMAGING — MG MM DIGITAL SCREENING BILAT W/ TOMO W/ CAD
8 series · 8 of 24 positions shown · non-contrast
Comparison: Previous exam(s).

CLINICAL DATA: Screening.

EXAM:
DIGITAL SCREENING BILATERAL MAMMOGRAM WITH TOMO AND CAD

[L MLO synth-2D]
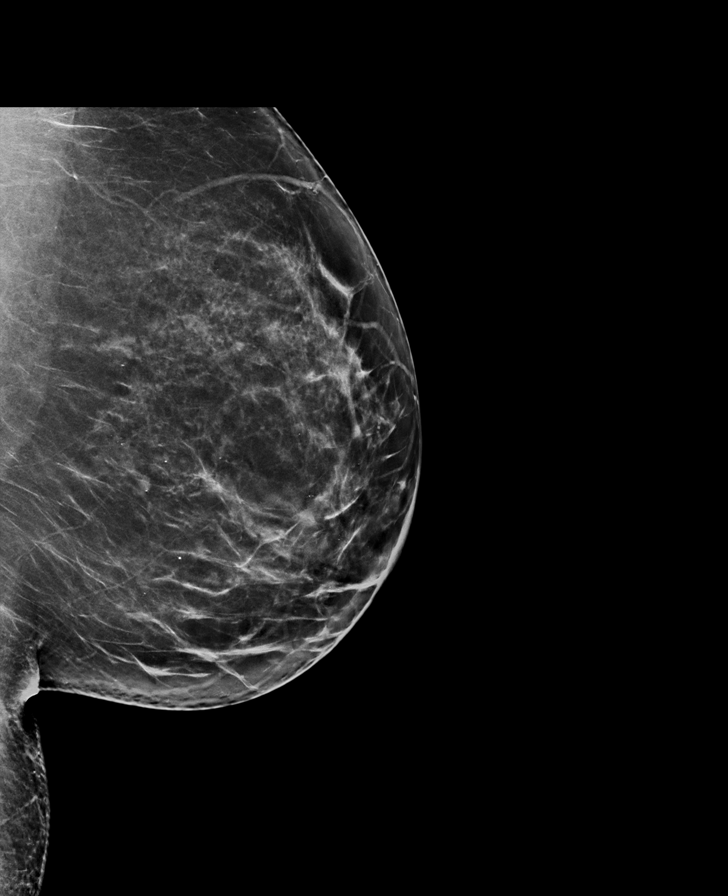

[R MLO synth-2D]
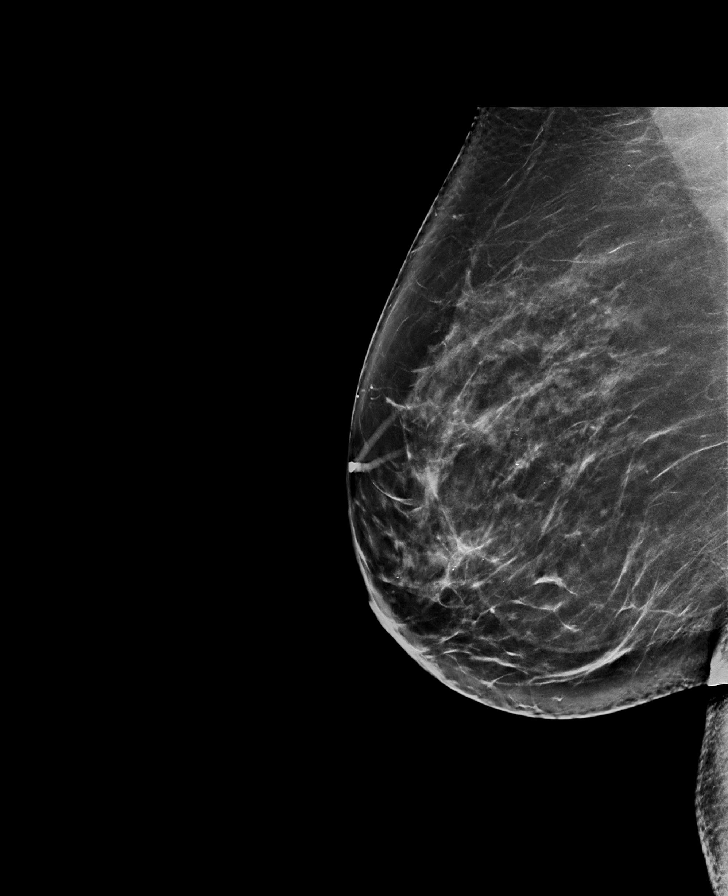

[L CC synth-2D]
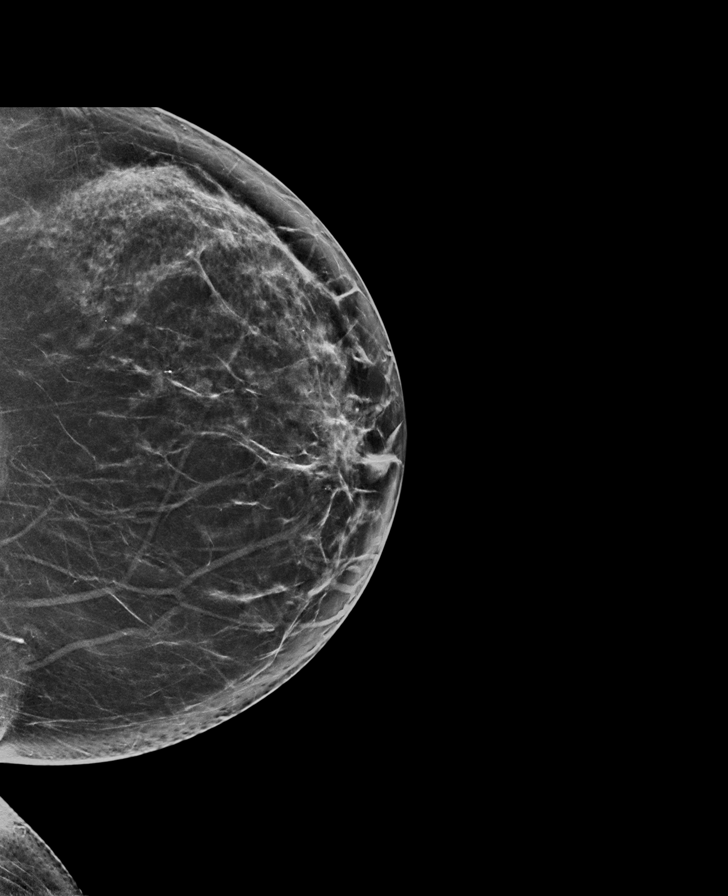

[R CC synth-2D]
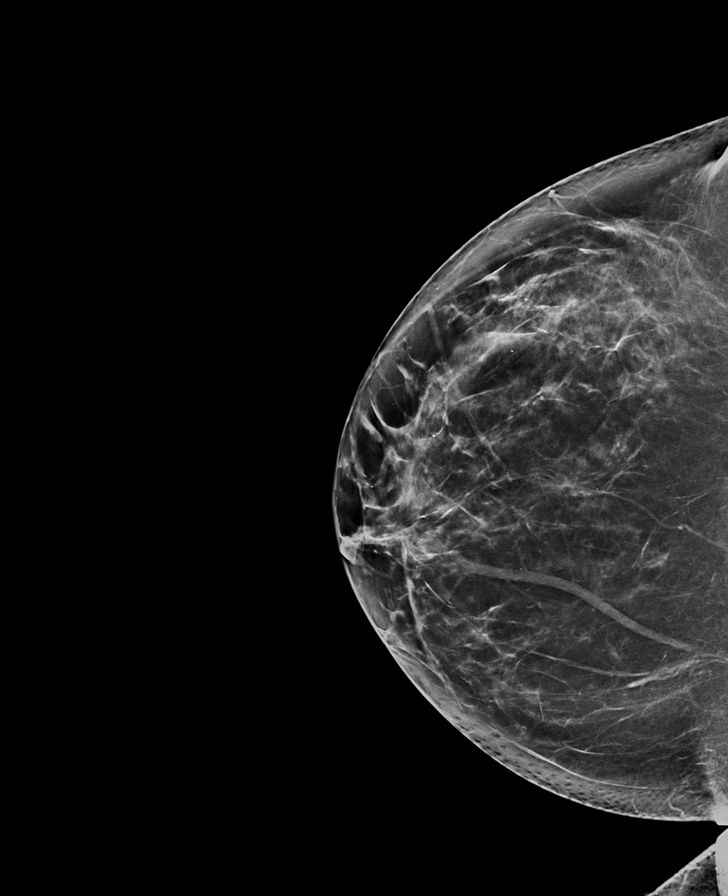

[L MLO tomo · tomo slice 45/89.0]
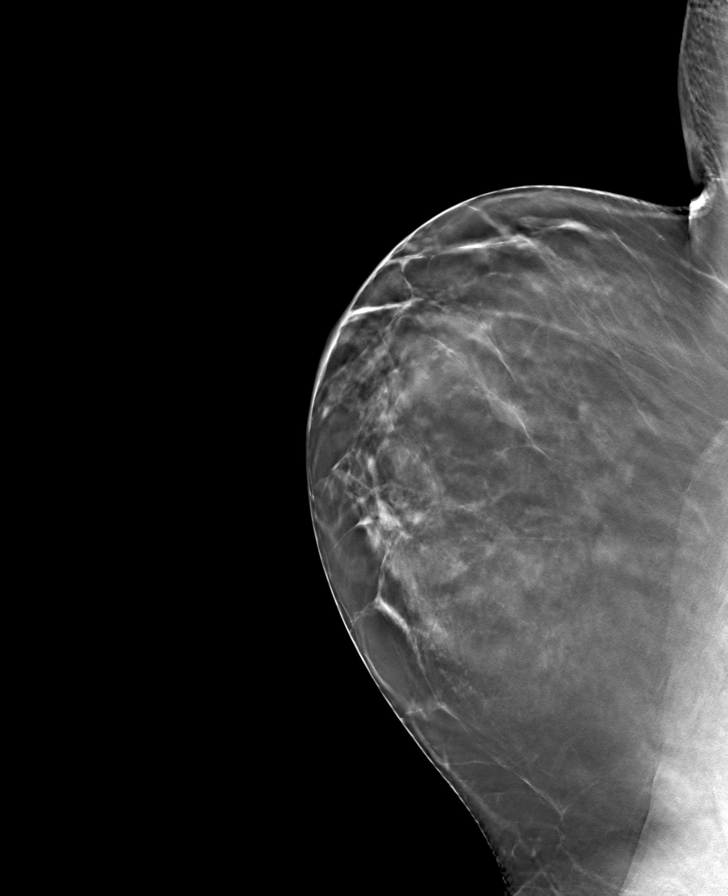

[L CC tomo · tomo slice 42/83.0]
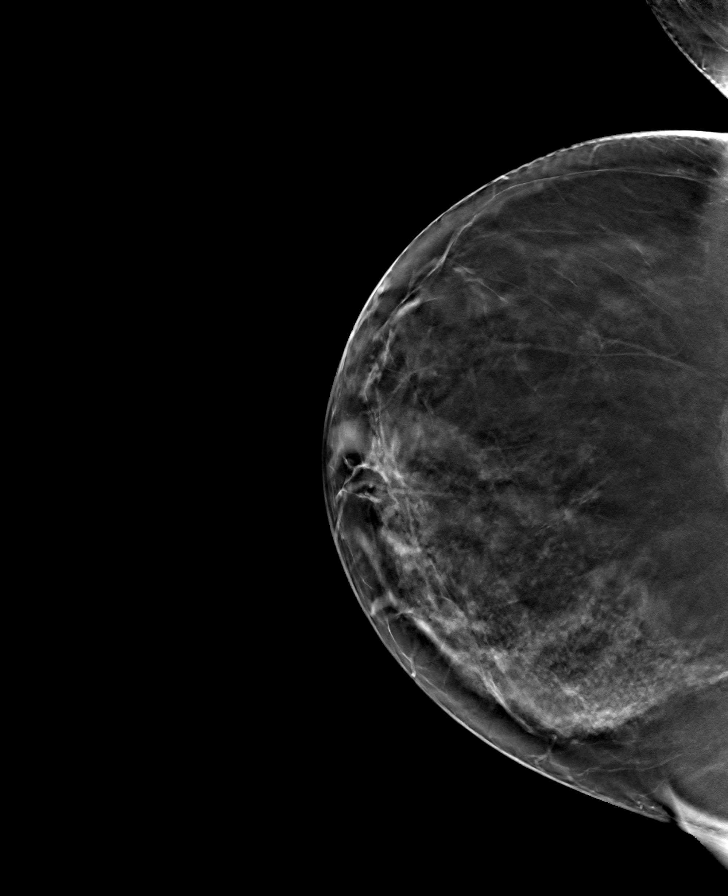

[R MLO tomo · tomo slice 45/90.0]
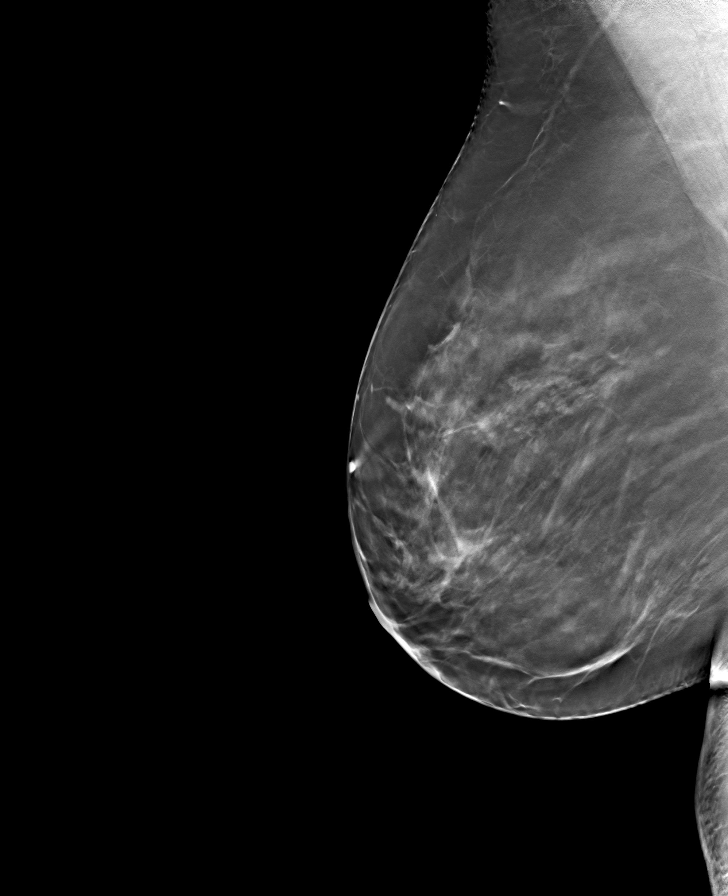

[R CC tomo · tomo slice 40/79.0]
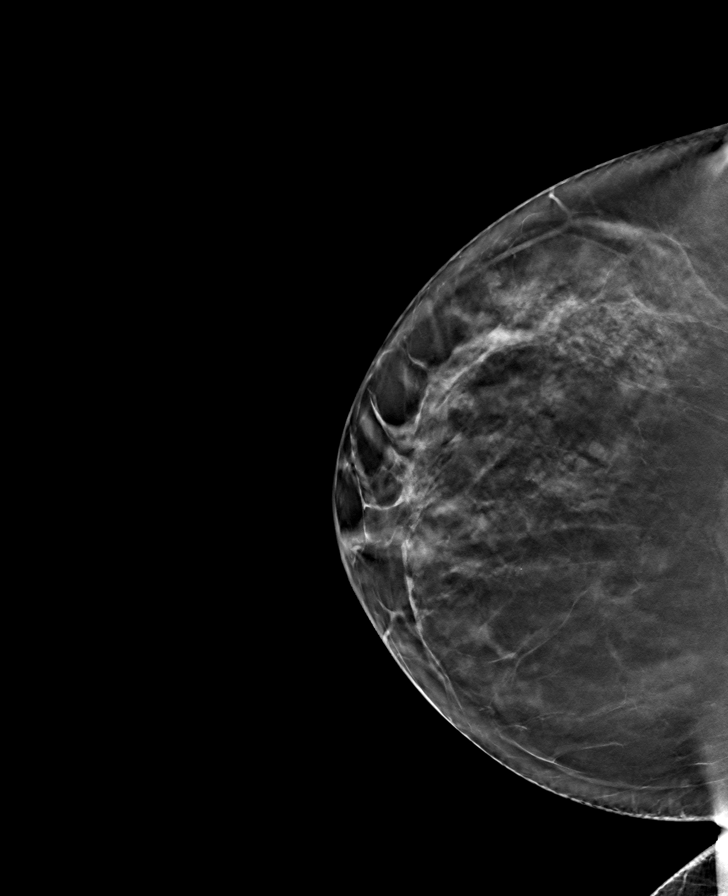

[8 of 24 positions shown; findings below may reference images not displayed]

ACR Breast Density Category c: The breast tissue is heterogeneously
dense, which may obscure small masses.
FINDINGS: There are no findings suspicious for malignancy. Images were
processed with CAD.
IMPRESSION: No mammographic evidence of malignancy. A result letter of this
screening mammogram will be mailed directly to the patient.

RECOMMENDATION:
Screening mammogram in one year. (Code:[5V])

BI-RADS CATEGORY  1: Negative.

## 2018-06-14 MED ORDER — ERGOCALCIFEROL 62.5 MCG (2500 UT) PO CAPS: 5000.0000 mg | ORAL_CAPSULE | Freq: Every day | ORAL | 6 refills | Status: DC

## 2018-06-14 MED ORDER — ALPRAZOLAM 1 MG PO TABS: 1.0000 mg | ORAL_TABLET | Freq: Two times a day (BID) | ORAL | 2 refills | Status: DC

## 2018-09-12 ENCOUNTER — Other Ambulatory Visit: Payer: Self-pay | Admitting: Obstetrics and Gynecology

## 2018-09-12 DIAGNOSIS — F4321 Adjustment disorder with depressed mood: Secondary | ICD-10-CM

## 2018-09-13 ENCOUNTER — Other Ambulatory Visit: Payer: Self-pay | Admitting: *Deleted

## 2018-09-13 MED ORDER — ALPRAZOLAM 1 MG PO TABS: 1.0000 mg | ORAL_TABLET | Freq: Two times a day (BID) | ORAL | 2 refills | Status: DC

## 2018-12-12 ENCOUNTER — Other Ambulatory Visit: Payer: Self-pay | Admitting: Obstetrics and Gynecology

## 2018-12-16 ENCOUNTER — Emergency Department
Admission: EM | Admit: 2018-12-16 | Discharge: 2018-12-16 | Disposition: A | Discharge status: Home / Self Care | Payer: 59 | Attending: Emergency Medicine | Admitting: Emergency Medicine

## 2018-12-16 ENCOUNTER — Encounter: Payer: Self-pay | Admitting: Emergency Medicine

## 2018-12-16 ENCOUNTER — Other Ambulatory Visit: Payer: Self-pay

## 2018-12-16 ENCOUNTER — Emergency Department: Payer: 59

## 2018-12-16 DIAGNOSIS — Z7982 Long term (current) use of aspirin: Secondary | ICD-10-CM | POA: Insufficient documentation

## 2018-12-16 DIAGNOSIS — Z87891 Personal history of nicotine dependence: Secondary | ICD-10-CM | POA: Insufficient documentation

## 2018-12-16 DIAGNOSIS — Y9289 Other specified places as the place of occurrence of the external cause: Secondary | ICD-10-CM | POA: Diagnosis not present

## 2018-12-16 DIAGNOSIS — S5011XA Contusion of right forearm, initial encounter: Secondary | ICD-10-CM

## 2018-12-16 DIAGNOSIS — R109 Unspecified abdominal pain: Secondary | ICD-10-CM | POA: Diagnosis not present

## 2018-12-16 DIAGNOSIS — S63511A Sprain of carpal joint of right wrist, initial encounter: Secondary | ICD-10-CM | POA: Diagnosis not present

## 2018-12-16 DIAGNOSIS — W010XXA Fall on same level from slipping, tripping and stumbling without subsequent striking against object, initial encounter: Secondary | ICD-10-CM | POA: Diagnosis not present

## 2018-12-16 DIAGNOSIS — S6991XA Unspecified injury of right wrist, hand and finger(s), initial encounter: Secondary | ICD-10-CM | POA: Diagnosis present

## 2018-12-16 DIAGNOSIS — Y998 Other external cause status: Secondary | ICD-10-CM | POA: Insufficient documentation

## 2018-12-16 DIAGNOSIS — Y9389 Activity, other specified: Secondary | ICD-10-CM | POA: Insufficient documentation

## 2018-12-16 LAB — URINALYSIS, COMPLETE (UACMP) WITH MICROSCOPIC
BACTERIA UA: NONE SEEN
BILIRUBIN URINE: NEGATIVE
Glucose, UA: NEGATIVE mg/dL
Hgb urine dipstick: NEGATIVE
Ketones, ur: NEGATIVE mg/dL
LEUKOCYTE UA: NEGATIVE
Nitrite: NEGATIVE
Protein, ur: NEGATIVE mg/dL
Specific Gravity, Urine: 1.011 (ref 1.005–1.030)
pH: 6 (ref 5.0–8.0)

## 2018-12-16 IMAGING — DX DG WRIST COMPLETE 3+V*R*
4 series · 4 of 4 positions shown · non-contrast
Comparison: None.

CLINICAL DATA: Status post fall today with right wrist pain.

EXAM:
RIGHT WRIST - COMPLETE 3+ VIEW

[wrist ap (1 of 2)]
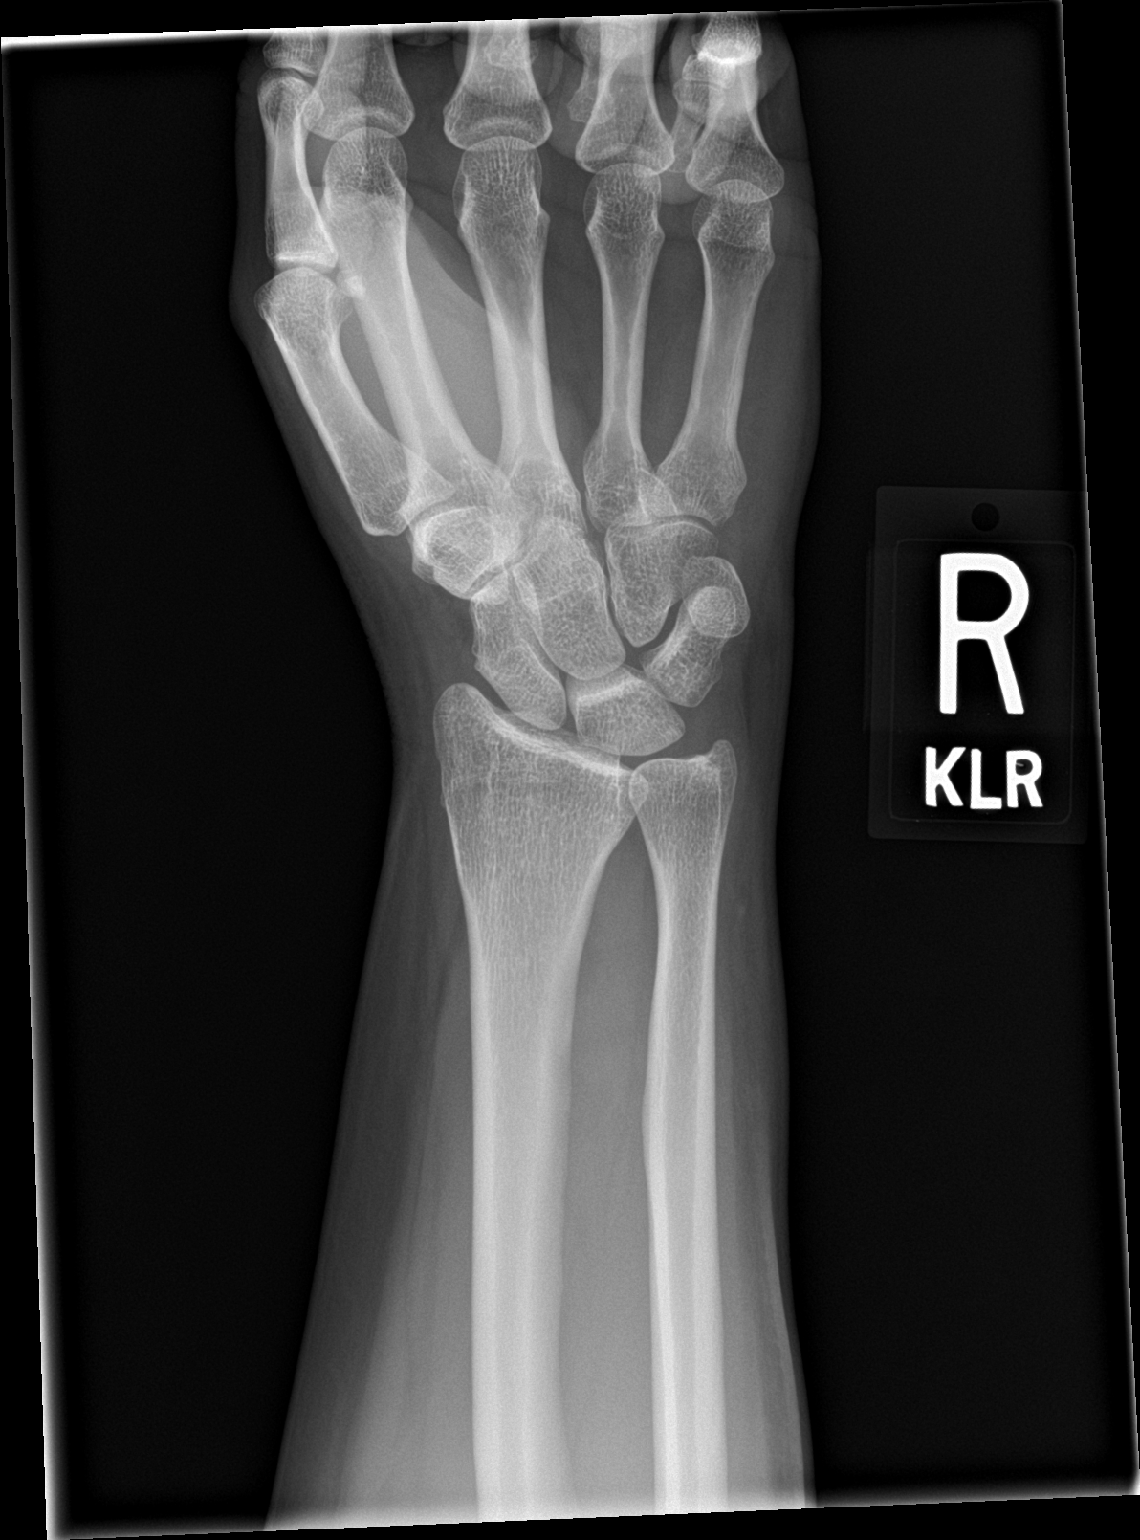

[wrist lat]
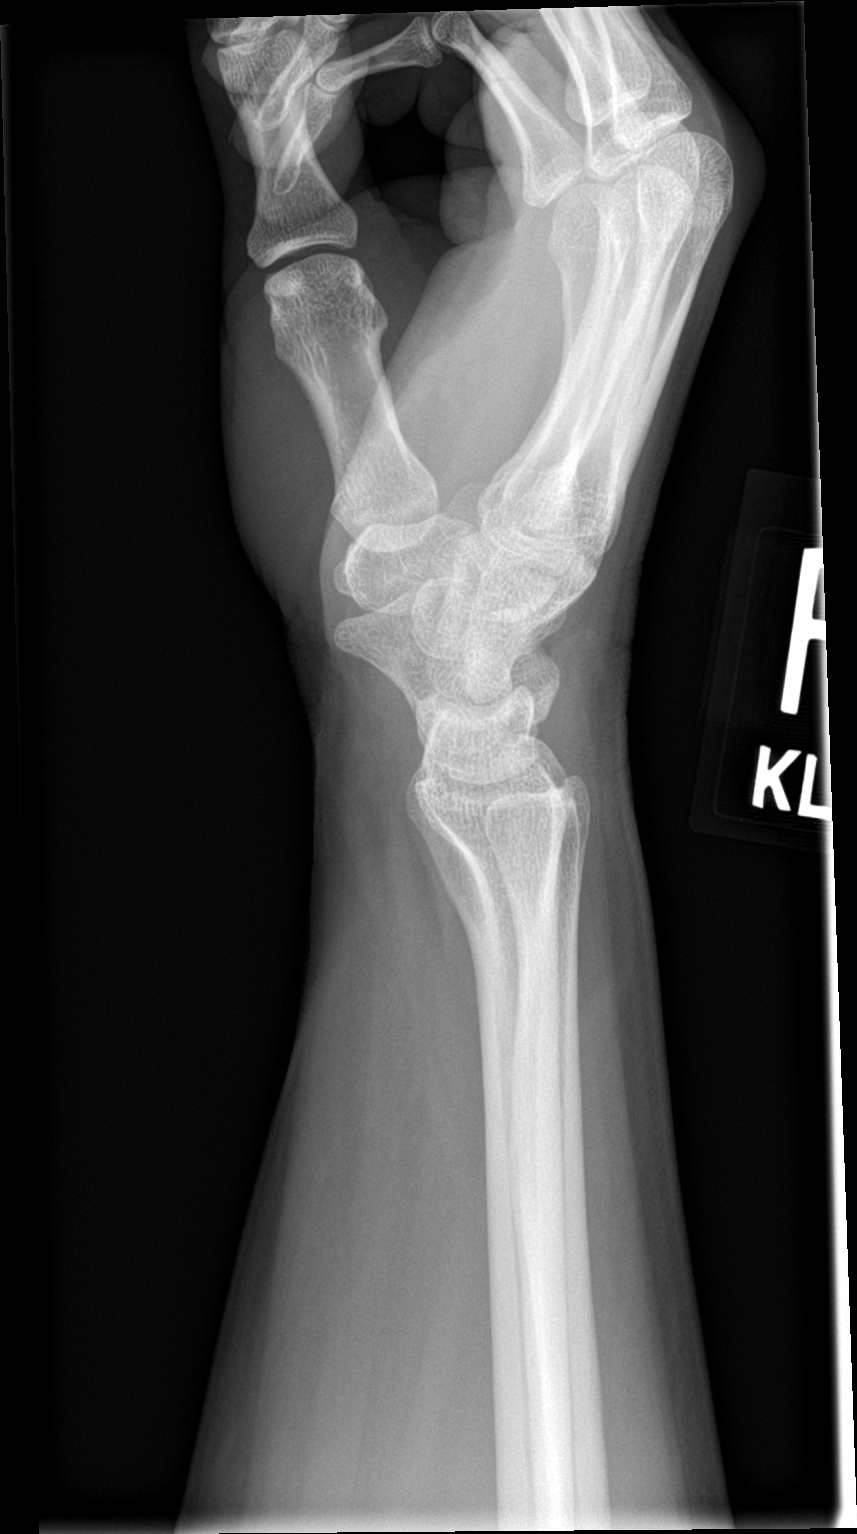

[wrist ap (2 of 2)]
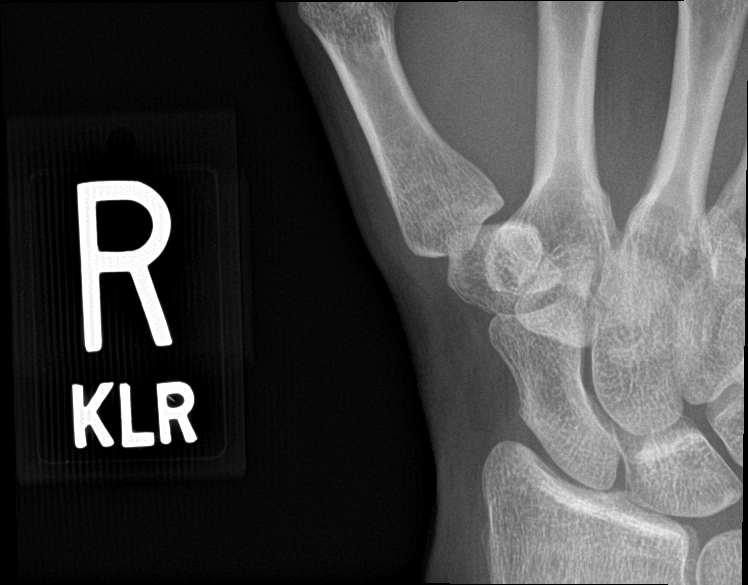

[wrist obl]
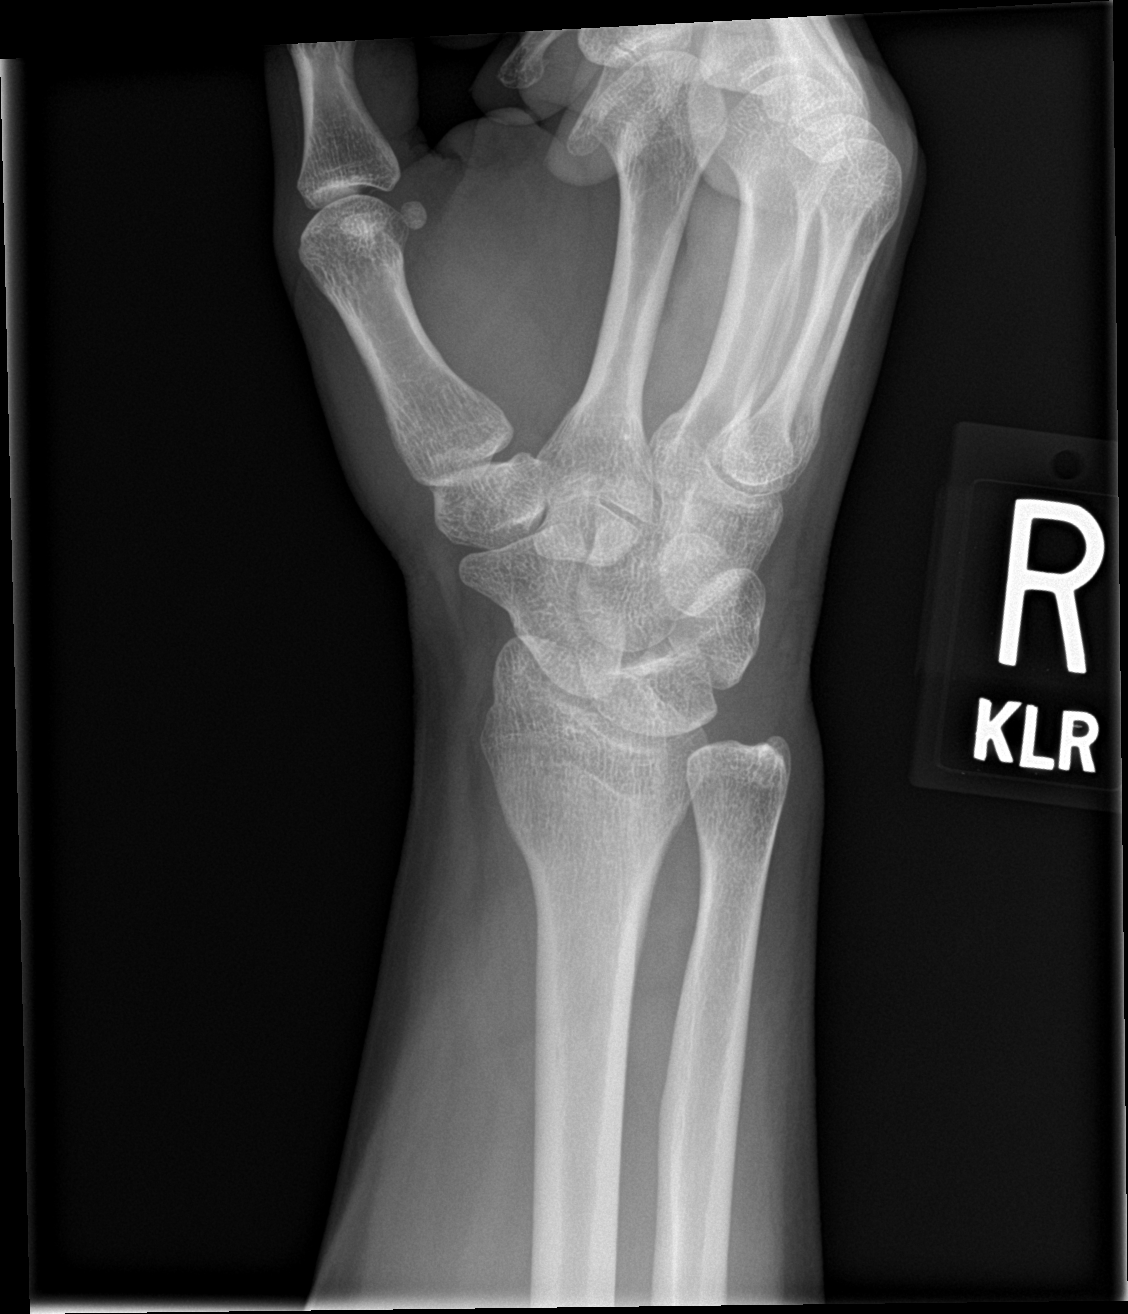

[4 of 4 positions shown; findings below may reference images not displayed]

FINDINGS: There is no evidence of fracture or dislocation. There is no
evidence of arthropathy or other focal bone abnormality. Soft
tissues are unremarkable.
IMPRESSION: Negative.

## 2018-12-16 IMAGING — DX DG ELBOW COMPLETE 3+V*R*
4 series · 4 of 4 positions shown · non-contrast
Comparison: None.

CLINICAL DATA: Status post fall this morning with right elbow pain.

EXAM:
RIGHT ELBOW - COMPLETE 3+ VIEW

[elbow ap]
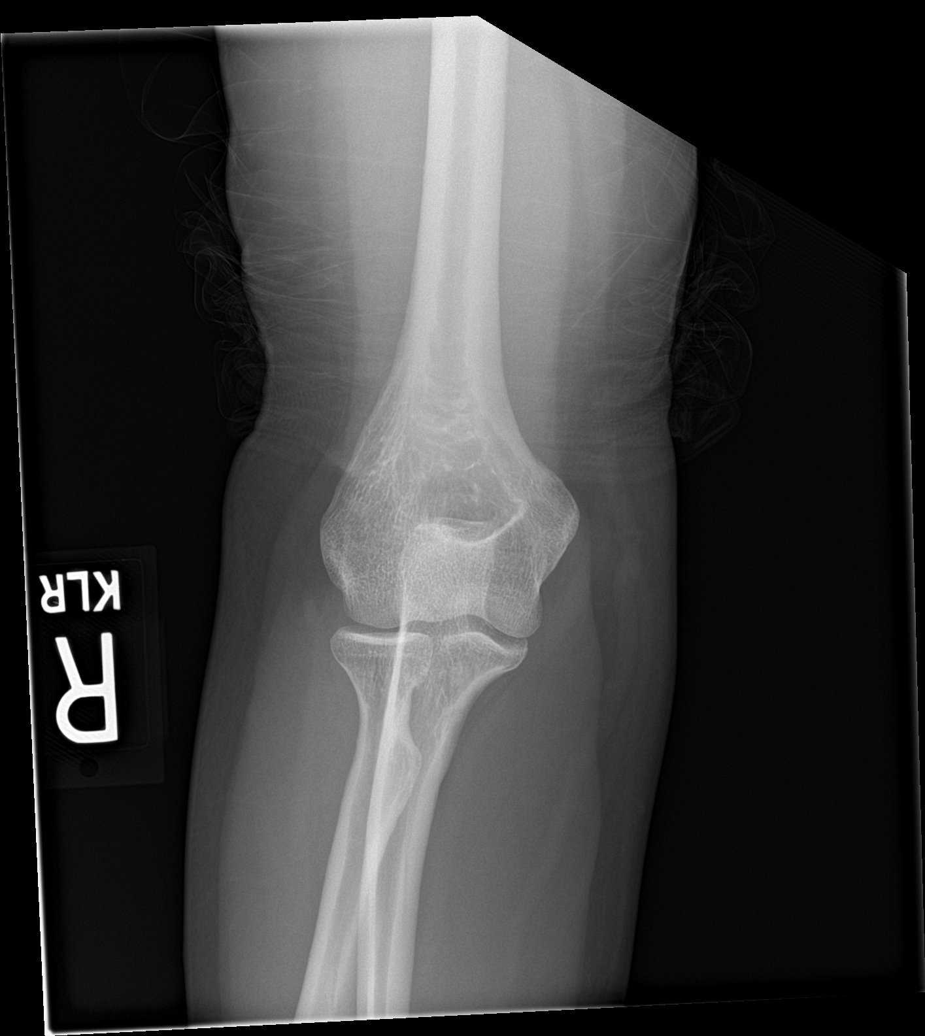

[elbow obl (1 of 2)]
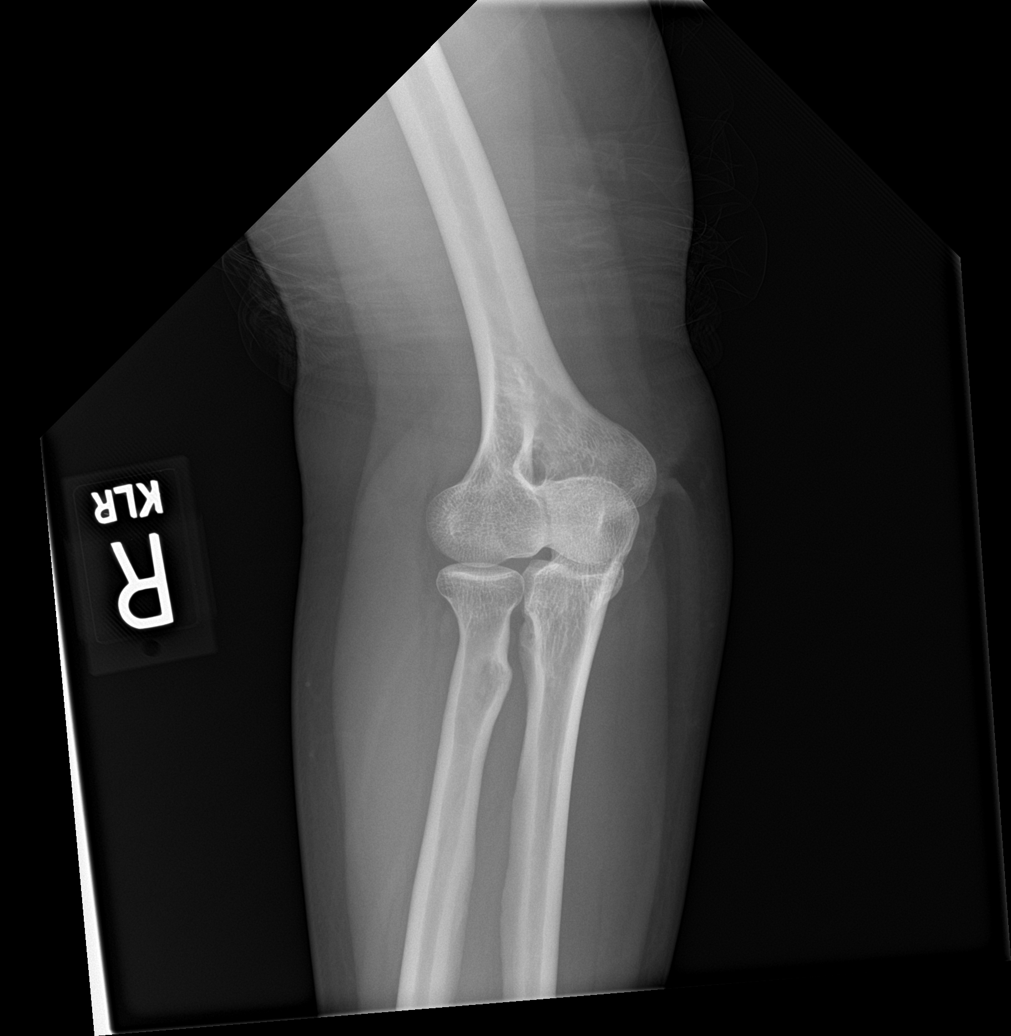

[elbow obl (2 of 2)]
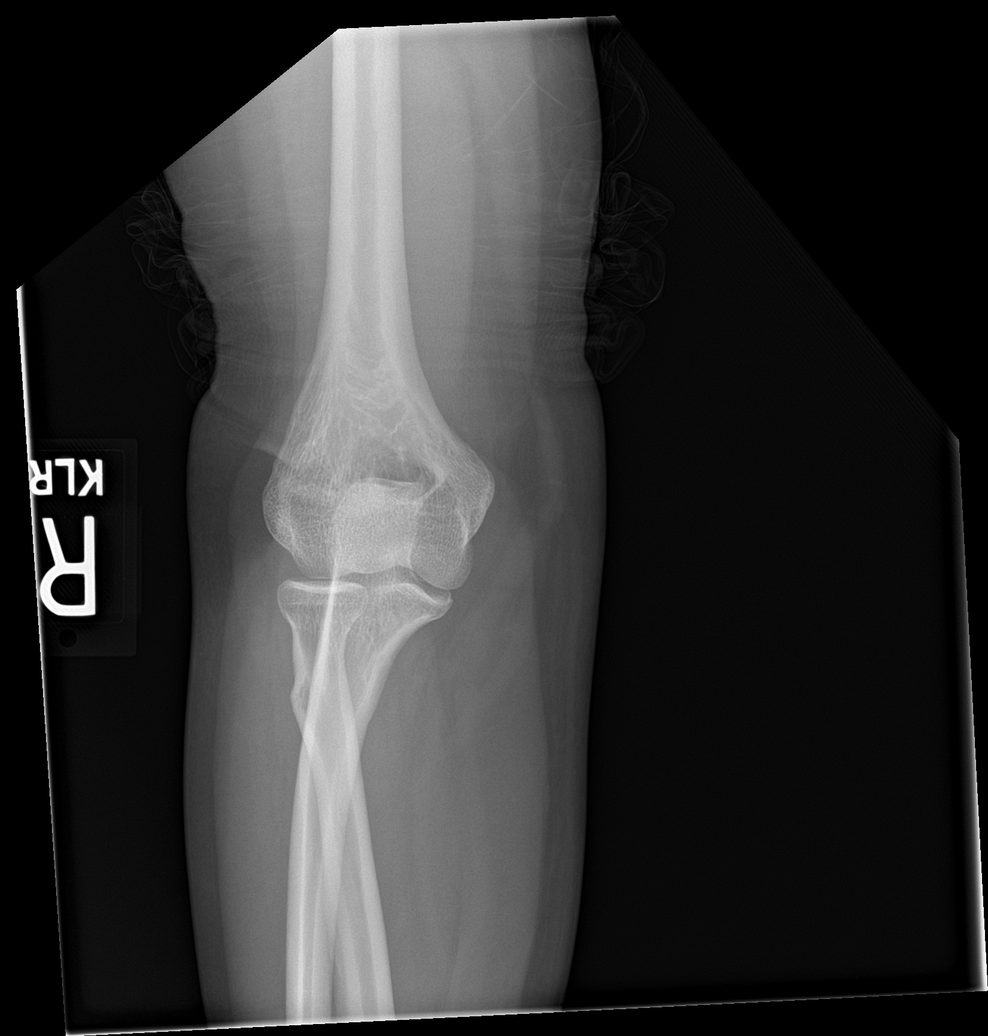

[elbow lat]
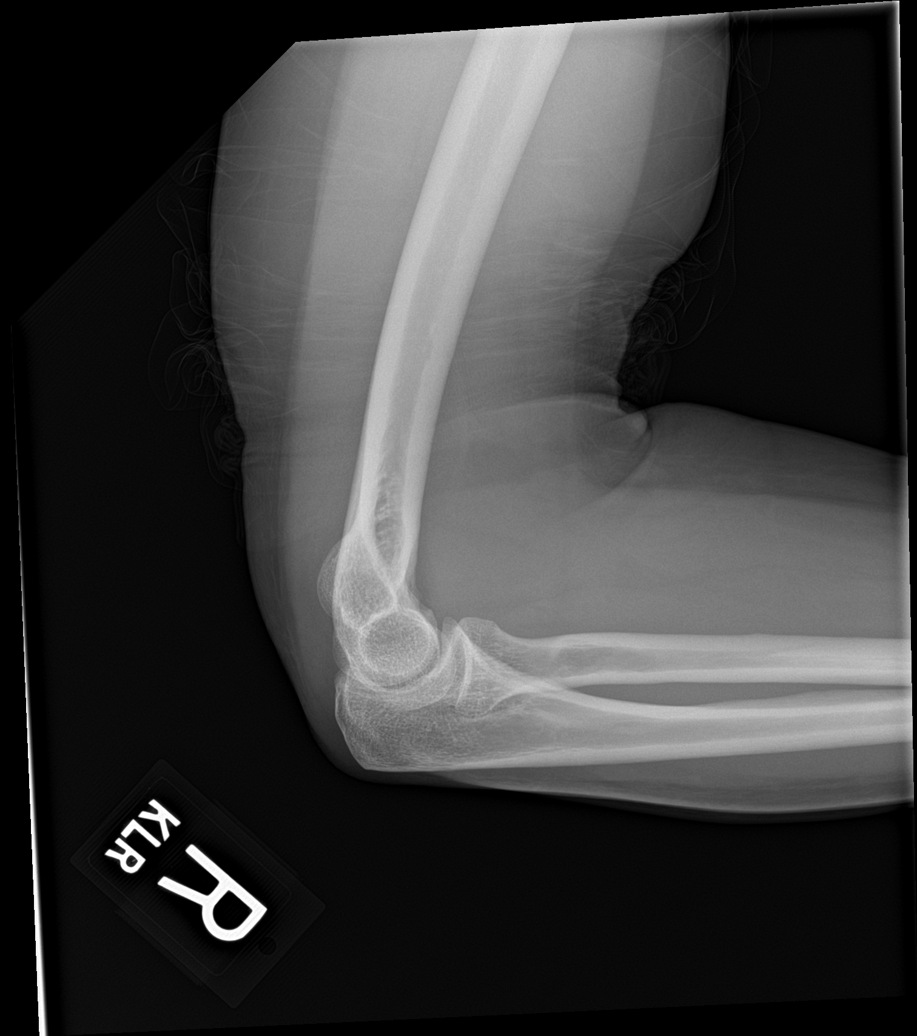

[4 of 4 positions shown; findings below may reference images not displayed]

FINDINGS: There is no evidence of fracture, dislocation, or joint effusion.
There is no evidence of arthropathy or other focal bone abnormality.
Soft tissues are unremarkable.
IMPRESSION: Negative.

## 2018-12-16 MED ORDER — TRAMADOL HCL 50 MG PO TABS: 50.0000 mg | ORAL_TABLET | Freq: Two times a day (BID) | ORAL | 0 refills | Status: DC | PRN

## 2018-12-16 MED ORDER — ONDANSETRON 4 MG PO TBDP
4.0000 mg | ORAL_TABLET | Freq: Once | ORAL | Status: AC
  Administered 2018-12-16: 4 mg via ORAL
  Filled 2018-12-16: qty 1

## 2018-12-16 MED ORDER — OXYCODONE-ACETAMINOPHEN 5-325 MG PO TABS
1.0000 | ORAL_TABLET | Freq: Once | ORAL | Status: AC
  Administered 2018-12-16: 1 via ORAL
  Filled 2018-12-16: qty 1

## 2018-12-16 MED ORDER — IBUPROFEN 600 MG PO TABS
600.0000 mg | ORAL_TABLET | Freq: Once | ORAL | Status: AC
  Administered 2018-12-16: 600 mg via ORAL
  Filled 2018-12-16: qty 1

## 2018-12-16 NOTE — ED Triage Notes (Signed)
Pt states she slipped and fell on the ice this am, pain in right hand and wrist. NAD.

## 2018-12-16 NOTE — ED Provider Notes (Signed)
[facility] Emergency Department Provider Note   ____________________________________________   First MD Initiated Contact with Patient 12/16/18 0827     (approximate)  I have reviewed the triage vital signs and the nursing notes.   HISTORY  Chief Complaint Hand Pain and Wrist Pain    HPI Katelyn Hobbs is a 43 y.o. female patient complain of right elbow, right wrist, and right flank pain secondary to a fall.  Patient slipped and fell on ice this morning exiting her house.  Patient denies LOC or head injury.  Patient denies loss of sensation but decreased range of motion with extension of the elbow and wrist.  Patient rates the pain is 8/10.  Patient described the pain is "achy".  No palliative measures prior to arrival.    Past Medical History:  Diagnosis Date  . Abnormal uterine bleeding   . Anemia   . Anxiety   . Bacterial vaginosis   . Endometriosis   . Migraine   . PCOS (polycystic ovarian syndrome)     Patient Active Problem List   Diagnosis Date Noted  . Vitamin D deficiency 09/21/2015  . Elevated glucose level 09/21/2015  . Overweight 09/17/2015    Past Surgical History:  Procedure Laterality Date  . LAPAROSCOPY  1993  . SALPINGECTOMY Bilateral 2015  . VAGINAL HYSTERECTOMY      Prior to Admission medications   Medication Sig Start Date End Date Taking? Authorizing Provider  ALPRAZolam Duanne Moron) 1 MG tablet TAKE 1 TABLET BY MOUTH TWICE A DAY 12/12/18   Shambley, Melody N, CNM  amphetamine-dextroamphetamine (ADDERALL) 20 MG tablet Take 1 tablet (20 mg total) by mouth 2 (two) times daily. Patient not taking: Reported on 05/17/2018 10/01/17   Juline Patch, MD  aspirin 81 MG chewable tablet Chew 1 tablet (81 mg total) by mouth daily. 05/19/18   Shambley, Melody N, CNM  azithromycin (ZITHROMAX) 250 MG tablet Take 1 tablet (250 mg total) by mouth daily. 06/01/18   Shambley, Melody N, CNM  citalopram (CELEXA) 40 MG tablet Take 1 tablet (40  mg total) by mouth daily. 05/05/18   Shambley, Melody N, CNM  citalopram (CELEXA) 40 MG tablet TAKE 1 TABLET BY MOUTH DAILY 09/13/18   Shambley, Melody N, CNM  cyanocobalamin (,VITAMIN B-12,) 1000 MCG/ML injection Inject 1 mL (1,000 mcg total) into the muscle every 30 (thirty) days. Patient not taking: Reported on 05/17/2018 02/18/16   Shambley, Melody N, CNM  cyanocobalamin (,VITAMIN B-12,) 1000 MCG/ML injection Inject 1 mL (1,000 mcg total) into the muscle every 30 (thirty) days. 05/17/18   Joylene Igo, CNM  Ergocalciferol 2500 units CAPS Take 5,000 mg by mouth daily. 06/14/18   Shambley, Melody N, CNM  phentermine (ADIPEX-P) 37.5 MG tablet TAKE 1 TABLET BY MOUTH EVERY DAY Patient not taking: Reported on 05/17/2018 10/12/17   Shambley, Melody N, CNM  traMADol (ULTRAM) 50 MG tablet Take 1 tablet (50 mg total) by mouth every 12 (twelve) hours as needed. 12/16/18   Sable Feil, PA-C    Allergies Patient has no known allergies.  Family History  Problem Relation Age of Onset  . Cancer Maternal Grandmother        breast  . Heart disease Maternal Grandmother   . Breast cancer Maternal Grandmother   . Heart disease Paternal Grandmother   . Cancer Paternal Grandfather        colon  . Diabetes Paternal Grandfather   . Diabetes Father   . Cancer Mother   .  Cancer Sister     Social History Social History   Tobacco Use  . Smoking status: Former Research scientist (life sciences)  . Smokeless tobacco: Never Used  Substance Use Topics  . Alcohol use: Yes    Alcohol/week: 0.0 standard drinks    Comment: occas  . Drug use: No    Review of Systems  Constitutional: No fever/chills Eyes: No visual changes. ENT: No sore throat. Cardiovascular: Denies chest pain. Respiratory: Denies shortness of breath. Gastrointestinal: No abdominal pain.  No nausea, no vomiting.  No diarrhea.  No constipation. Genitourinary: Negative for dysuria. Musculoskeletal: positive for back pain.  Right elbow and right wrist  pain. Skin: Negative for rash. Neurological: Negative for headaches, focal weakness or numbness. Psychiatric:  Anxiety. ____________________________________________   PHYSICAL EXAM:  VITAL SIGNS: ED Triage Vitals  Enc Vitals Group     BP 12/16/18 0813 120/64     Pulse Rate 12/16/18 0813 78     Resp 12/16/18 0813 16     Temp 12/16/18 0813 98.1 F (36.7 C)     Temp Source 12/16/18 0813 Oral     SpO2 12/16/18 0813 97 %     Weight 12/16/18 0804 180 lb (81.6 kg)     Height 12/16/18 0804 5\' 6"  (1.676 m)     Head Circumference --      Peak Flow --      Pain Score 12/16/18 0804 8     Pain Loc --      Pain Edu? --      Excl. in [place]? --    Constitutional: Alert and oriented. Well appearing and in no acute distress. Neck: No stridor.  No cervical spine tenderness to palpation. Cardiovascular: Normal rate, regular rhythm. Grossly normal heart sounds.  Good peripheral circulation. Respiratory: Normal respiratory effort.  No retractions. Lungs CTAB. Gastrointestinal: Soft and nontender. No distention. No abdominal bruits.  Right CVA tenderness. Musculoskeletal: No lower extremity tenderness nor edema.  No joint effusions. Neurologic:  Normal speech and language. No gross focal neurologic deficits are appreciated. No gait instability. Skin:  Skin is warm, dry and intact. No rash noted.  Abrasion right flank. Psychiatric: Mood and affect are normal. Speech and behavior are normal.  ____________________________________________   LABS (all labs ordered are listed, but only abnormal results are displayed)  Labs Reviewed  URINALYSIS, COMPLETE (UACMP) WITH MICROSCOPIC - Abnormal; Notable for the following components:      Result Value   Color, Urine YELLOW (*)    APPearance HAZY (*)    All other components within normal limits   ____________________________________________  EKG   ____________________________________________  RADIOLOGY  ED MD interpretation:    Official  radiology report(s): Dg Elbow Complete Right  Result Date: 12/16/2018 CLINICAL DATA:  Status post fall this morning with right elbow pain. EXAM: RIGHT ELBOW - COMPLETE 3+ VIEW COMPARISON:  None. FINDINGS: There is no evidence of fracture, dislocation, or joint effusion. There is no evidence of arthropathy or other focal bone abnormality. Soft tissues are unremarkable. IMPRESSION: Negative. Electronically Signed   By: Abelardo Diesel M.D.   On: 12/16/2018 08:54   Dg Wrist Complete Right  Result Date: 12/16/2018 CLINICAL DATA:  Status post fall today with right wrist pain. EXAM: RIGHT WRIST - COMPLETE 3+ VIEW COMPARISON:  None. FINDINGS: There is no evidence of fracture or dislocation. There is no evidence of arthropathy or other focal bone abnormality. Soft tissues are unremarkable. IMPRESSION: Negative. Electronically Signed   By: Abelardo Diesel M.D.   On:  12/16/2018 08:54    ____________________________________________   PROCEDURES  Procedure(s) performed: None  .Splint Application Date/Time: 6/50/3546 9:21 AM Performed by: Alger Memos, NT Authorized by: Sable Feil, PA-C   Consent:    Consent obtained:  Verbal   Consent given by:  Patient   Risks discussed:  Numbness, pain and swelling Pre-procedure details:    Sensation:  Normal Procedure details:    Laterality:  Right   Location:  Wrist   Wrist:  R wrist   Splint type:  Wrist   Supplies:  Prefabricated splint Post-procedure details:    Pain:  Unchanged   Sensation:  Normal   Patient tolerance of procedure:  Tolerated well, no immediate complications    Critical Care performed: No  ____________________________________________   INITIAL IMPRESSION / ASSESSMENT AND PLAN / ED COURSE  As part of my medical decision making, I reviewed the following data within the electronic MEDICAL RECORD NUMBER     Right wrist and forearm pain secondary to fall.  Discussed x-ray findings with patient.  Patient placed in a wrist  splint and sling and given discharge care instruction.  Take pain medication as needed.  Follow-up with PCP as needed.      ____________________________________________   FINAL CLINICAL IMPRESSION(S) / ED DIAGNOSES  Final diagnoses:  Sprain of carpal joint of right wrist, initial encounter  Contusion of right forearm, initial encounter     ED Discharge Orders         Ordered    traMADol (ULTRAM) 50 MG tablet  Every 12 hours PRN     12/16/18 0937           Note:  This document was prepared using Dragon voice recognition software and may include unintentional dictation errors.    Sable Feil, PA-C 12/16/18 5681    Delman Kitten, MD 12/16/18 [phone]

## 2018-12-16 NOTE — ED Notes (Signed)
See triage note  Presents s/p fall  Stats she slipped this am  Hitting right mid back on steps  Abrasion noted to right mid back  Also having pain to right wrist/hand

## 2018-12-17 ENCOUNTER — Other Ambulatory Visit: Payer: Self-pay

## 2018-12-19 NOTE — Telephone Encounter (Signed)
See message please

## 2018-12-20 MED ORDER — PHENTERMINE HCL 37.5 MG PO TABS: 37.5000 mg | ORAL_TABLET | Freq: Every day | ORAL | 0 refills | Status: DC

## 2019-02-06 ENCOUNTER — Other Ambulatory Visit: Payer: Self-pay | Admitting: Obstetrics and Gynecology

## 2019-02-06 DIAGNOSIS — F4321 Adjustment disorder with depressed mood: Secondary | ICD-10-CM

## 2019-03-05 ENCOUNTER — Other Ambulatory Visit: Payer: Self-pay | Admitting: Obstetrics and Gynecology

## 2019-05-04 ENCOUNTER — Other Ambulatory Visit: Payer: Self-pay | Admitting: Obstetrics and Gynecology

## 2019-05-04 ENCOUNTER — Telehealth: Payer: Self-pay | Admitting: Obstetrics and Gynecology

## 2019-05-04 DIAGNOSIS — R509 Fever, unspecified: Secondary | ICD-10-CM

## 2019-05-04 NOTE — Telephone Encounter (Signed)
The patient called and stated that she is wanting to know if she is able to do a tele-visit since she is not able to come into the office after being tested for covid today along with her symptoms. Please advise.

## 2019-05-04 NOTE — Telephone Encounter (Signed)
Patient called stating she woke up with a fever, chills and body aches. She works at [employer] and they wont allow her to come to work without being tested. 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 Thanks

## 2019-05-05 ENCOUNTER — Ambulatory Visit (INDEPENDENT_AMBULATORY_CARE_PROVIDER_SITE_OTHER): Payer: BC Managed Care – PPO | Admitting: Obstetrics and Gynecology

## 2019-05-05 ENCOUNTER — Telehealth: Payer: Self-pay | Admitting: Family Medicine

## 2019-05-05 ENCOUNTER — Encounter: Payer: Self-pay | Admitting: Obstetrics and Gynecology

## 2019-05-05 ENCOUNTER — Other Ambulatory Visit: Payer: Self-pay

## 2019-05-05 DIAGNOSIS — H1033 Unspecified acute conjunctivitis, bilateral: Secondary | ICD-10-CM | POA: Diagnosis not present

## 2019-05-05 DIAGNOSIS — J320 Chronic maxillary sinusitis: Secondary | ICD-10-CM | POA: Diagnosis not present

## 2019-05-05 MED ORDER — AMOXICILLIN-POT CLAVULANATE 875-125 MG PO TABS: 1.0000 | ORAL_TABLET | Freq: Two times a day (BID) | ORAL | 1 refills | Status: DC

## 2019-05-05 MED ORDER — FLUTICASONE PROPIONATE 50 MCG/ACT NA SUSP: 1.0000 | Freq: Every day | NASAL | 2 refills | Status: DC

## 2019-05-05 NOTE — Telephone Encounter (Signed)
Patient called about a televisit for low grade fever, sinus

## 2019-05-05 NOTE — Progress Notes (Signed)
Virtual Visit via Telephone Note  I connected with Katelyn Hobbs on 05/05/19 at  2:00 PM EDT by telephone and verified that I am speaking with the correct person using two identifiers.  Location: Patient: home Provider: office   I discussed the limitations, risks, security and privacy concerns of performing an evaluation and management service by telephone and the availability of in person appointments. I also discussed with the patient that there may be a patient responsible charge related to this service. The patient expressed understanding and agreed to proceed.   History of Present Illness: Reports onset of low grade temp 2 days ago with sinus pressure and pressure behind eyes. Yellow Drainage from eyes. No cough or SOB. Had Covid test done yesterday due to exposure at work last week. Is still taking daily zyrtec. Can't tolerate decongestants. Has been self isolating at home since yesterday while awaiting covid results.     Observations/Objective: Slight congested sounding on phone, no face to face observations  Assessment and Plan: Sinusitis conjuctivitis Positive covid exposure  Follow Up Instructions: Will send in prescriptions for augmentin and flonase to start now. Will consider eye drops if not improved over next few days. Tylenol only for pain/fever relief. Continue self isolation. To let us know if symptoms worsen.   I discussed the assessment and treatment plan with the patient. The patient was provided an opportunity to ask questions and all were answered. The patient agreed with the plan and demonstrated an understanding of the instructions.   The patient was advised to call back or seek an in-person evaluation if the symptoms worsen or if the condition fails to improve as anticipated.  I provided 11 minutes of non-face-to-face time during this encounter.   Laddie Math Rockney Ghee, CNM

## 2019-05-05 NOTE — Telephone Encounter (Signed)
Pt hasn't been seen since 2018- it would be best if she go to urgent care

## 2019-05-09 LAB — NOVEL CORONAVIRUS, NAA: SARS-CoV-2, NAA: NOT DETECTED

## 2019-05-23 ENCOUNTER — Encounter: Payer: Self-pay | Admitting: *Deleted

## 2019-05-23 ENCOUNTER — Encounter: Payer: 59 | Admitting: Obstetrics and Gynecology

## 2019-05-30 ENCOUNTER — Other Ambulatory Visit: Payer: Self-pay | Admitting: Obstetrics and Gynecology

## 2019-06-07 ENCOUNTER — Encounter: Payer: 59 | Admitting: Obstetrics and Gynecology

## 2019-07-12 ENCOUNTER — Other Ambulatory Visit: Payer: Self-pay | Admitting: Obstetrics and Gynecology

## 2019-07-12 DIAGNOSIS — F4321 Adjustment disorder with depressed mood: Secondary | ICD-10-CM

## 2019-07-27 ENCOUNTER — Other Ambulatory Visit: Payer: Self-pay | Admitting: Obstetrics and Gynecology

## 2019-07-30 ENCOUNTER — Other Ambulatory Visit: Payer: Self-pay | Admitting: Obstetrics and Gynecology

## 2019-07-31 DIAGNOSIS — R6889 Other general symptoms and signs: Secondary | ICD-10-CM | POA: Diagnosis not present

## 2019-07-31 DIAGNOSIS — J069 Acute upper respiratory infection, unspecified: Secondary | ICD-10-CM | POA: Diagnosis not present

## 2019-07-31 DIAGNOSIS — Z03818 Encounter for observation for suspected exposure to other biological agents ruled out: Secondary | ICD-10-CM | POA: Diagnosis not present

## 2019-08-21 ENCOUNTER — Other Ambulatory Visit: Payer: Self-pay | Admitting: Obstetrics and Gynecology

## 2019-09-05 ENCOUNTER — Encounter: Payer: BC Managed Care – PPO | Admitting: Obstetrics and Gynecology

## 2019-09-12 ENCOUNTER — Other Ambulatory Visit: Payer: Self-pay

## 2019-09-12 ENCOUNTER — Ambulatory Visit (INDEPENDENT_AMBULATORY_CARE_PROVIDER_SITE_OTHER): Payer: BC Managed Care – PPO | Admitting: Certified Nurse Midwife

## 2019-09-12 ENCOUNTER — Encounter: Payer: Self-pay | Admitting: Certified Nurse Midwife

## 2019-09-12 VITALS — BP 112/69 | HR 74 | Ht 66.0 in | Wt 197.1 lb

## 2019-09-12 DIAGNOSIS — G479 Sleep disorder, unspecified: Secondary | ICD-10-CM | POA: Diagnosis not present

## 2019-09-12 DIAGNOSIS — Z01419 Encounter for gynecological examination (general) (routine) without abnormal findings: Secondary | ICD-10-CM | POA: Diagnosis not present

## 2019-09-12 DIAGNOSIS — Z1231 Encounter for screening mammogram for malignant neoplasm of breast: Secondary | ICD-10-CM

## 2019-09-12 DIAGNOSIS — F4321 Adjustment disorder with depressed mood: Secondary | ICD-10-CM

## 2019-09-12 MED ORDER — CITALOPRAM HYDROBROMIDE 40 MG PO TABS: 40.0000 mg | ORAL_TABLET | Freq: Every day | ORAL | 3 refills | Status: DC

## 2019-09-12 NOTE — Progress Notes (Signed)
GYNECOLOGY ANNUAL PREVENTATIVE CARE ENCOUNTER NOTE  History:     Katelyn Hobbs is a 43 y.o. G0P0 female here for a routine annual gynecologic exam.  Current complaints: none   Denies abnormal vaginal bleeding, discharge, pelvic pain, problems with intercourse or other gynecologic concerns.    Gynecologic History No LMP recorded. Patient has had a hysterectomy. Contraception: hysterectomy Last Pap: 05/20/18 Results were: normal  ( no hpv testing done) Last mammogram: 06/14/18. Results were: normal  Obstetric History OB History  Gravida Para Term Preterm AB Living  0            SAB TAB Ectopic Multiple Live Births               Past Medical History:  Diagnosis Date  . Abnormal uterine bleeding   . Anemia   . Anxiety   . Bacterial vaginosis   . Endometriosis   . Migraine   . PCOS (polycystic ovarian syndrome)     Past Surgical History:  Procedure Laterality Date  . LAPAROSCOPY  1993  . SALPINGECTOMY Bilateral 2015  . VAGINAL HYSTERECTOMY      Current Outpatient Medications on File Prior to Visit  Medication Sig Dispense Refill  . ALPRAZolam (XANAX) 1 MG tablet TAKE 1 TABLET BY MOUTH TWICE A DAY 60 tablet 2  . citalopram (CELEXA) 40 MG tablet Take 1 tablet (40 mg total) by mouth daily. 30 tablet 3  . amphetamine-dextroamphetamine (ADDERALL) 20 MG tablet Take 1 tablet (20 mg total) by mouth 2 (two) times daily. (Patient not taking: Reported on 05/17/2018) 60 tablet 0  . aspirin 81 MG chewable tablet Chew 1 tablet (81 mg total) by mouth daily. (Patient not taking: Reported on 09/12/2019) 120 tablet 4  . cyanocobalamin (,VITAMIN B-12,) 1000 MCG/ML injection INJECT 1 ML (1,000 MCG TOTAL) INTO THE MUSCLE EVERY 30 (THIRTY) DAYS. (Patient not taking: Reported on 09/12/2019) 3 mL 6  . Ergocalciferol 2500 units CAPS Take 5,000 mg by mouth daily. (Patient not taking: Reported on 09/12/2019) 120 capsule 6  . fluticasone (FLONASE) 50 MCG/ACT nasal spray PLACE 1 SPRAY INTO BOTH  NOSTRILS DAILY. (Patient not taking: Reported on 09/12/2019) 48 mL 2  . phentermine (ADIPEX-P) 37.5 MG tablet Take 1 tablet (37.5 mg total) by mouth daily. (Patient not taking: Reported on 09/12/2019) 30 tablet 0  . traMADol (ULTRAM) 50 MG tablet Take 1 tablet (50 mg total) by mouth every 12 (twelve) hours as needed. (Patient not taking: Reported on 09/12/2019) 12 tablet 0   No current facility-administered medications on file prior to visit.     No Known Allergies  Social History:  reports that she has quit smoking. She has never used smokeless tobacco. She reports current alcohol use. She reports that she does not use drugs.  Family History  Problem Relation Age of Onset  . Cancer Maternal Grandmother        breast  . Heart disease Maternal Grandmother   . Breast cancer Maternal Grandmother   . Heart disease Paternal Grandmother   . Cancer Paternal Grandfather        colon  . Diabetes Paternal Grandfather   . Diabetes Father   . Cancer Mother   . Cancer Sister     The following portions of the patient's history were reviewed and updated as appropriate: allergies, current medications, past family history, past medical history, past social history, past surgical history and problem list.  Review of Systems Pertinent items noted in HPI and remainder of comprehensive  ROS otherwise negative.  Physical Exam:  BP 112/69   Pulse 74   Ht 5\' 6"  (1.676 m)   Wt 197 lb 2 oz (89.4 kg)   BMI 31.82 kg/m  CONSTITUTIONAL: Well-developed, well-nourished , over weight female in no acute distress.  HENT:  Normocephalic, atraumatic, External right and left ear normal. Oropharynx is clear and moist EYES: Conjunctivae and EOM are normal. Pupils are equal, round, and reactive to light. No scleral icterus.  NECK: Normal range of motion, supple, no masses.  Normal thyroid.  SKIN: Skin is warm and dry. No rash noted. Not diaphoretic. No erythema. No pallor. MUSCULOSKELETAL: Normal range of motion.  No tenderness.  No cyanosis, clubbing, or edema.  2+ distal pulses. NEUROLOGIC: Alert and oriented to person, place, and time. Normal reflexes, muscle tone coordination.  PSYCHIATRIC: Normal mood and affect. Normal behavior. Normal judgment and thought content. CARDIOVASCULAR: Normal heart rate noted, regular rhythm RESPIRATORY: Clear to auscultation bilaterally. Effort and breath sounds normal, no problems with respiration noted. BREASTS: Symmetric in size. No masses, tenderness, skin changes, nipple drainage, or lymphadenopathy bilaterally. ABDOMEN: Soft, no distention noted.  No tenderness, rebound or guarding.  PELVIC: Normal appearing external genitalia and urethral meatus; normal appearing vaginal mucosa. Cervix-absent.  No abnormal discharge noted.  Uterine -absent  no other palpable masses, no adnexal tenderness.   Assessment and Plan:  Annual Well Women GYN exam  Pap smear not indicated Mammogram ordered Referral to psychiatry for sleep disturbance/anxiety-xanax Refill-citalopram Routine preventative health maintenance measures emphasized. Please refer to After Visit Summary for other counseling recommendations.      Philip Aspen, CNM

## 2019-09-12 NOTE — Patient Instructions (Signed)

## 2019-11-04 ENCOUNTER — Ambulatory Visit (HOSPITAL_COMMUNITY): Payer: BC Managed Care – PPO | Admitting: Psychiatry

## 2019-11-10 ENCOUNTER — Encounter (HOSPITAL_COMMUNITY): Payer: Self-pay | Admitting: Psychiatry

## 2019-11-11 ENCOUNTER — Ambulatory Visit (INDEPENDENT_AMBULATORY_CARE_PROVIDER_SITE_OTHER): Payer: BC Managed Care – PPO | Admitting: Psychiatry

## 2019-11-11 ENCOUNTER — Other Ambulatory Visit: Payer: Self-pay

## 2019-11-11 ENCOUNTER — Encounter (HOSPITAL_COMMUNITY): Payer: Self-pay | Admitting: Psychiatry

## 2019-11-11 DIAGNOSIS — F4321 Adjustment disorder with depressed mood: Secondary | ICD-10-CM

## 2019-11-11 DIAGNOSIS — G4700 Insomnia, unspecified: Secondary | ICD-10-CM | POA: Diagnosis not present

## 2019-11-11 DIAGNOSIS — F988 Other specified behavioral and emotional disorders with onset usually occurring in childhood and adolescence: Secondary | ICD-10-CM

## 2019-11-11 DIAGNOSIS — F411 Generalized anxiety disorder: Secondary | ICD-10-CM | POA: Insufficient documentation

## 2019-11-11 DIAGNOSIS — F409 Phobic anxiety disorder, unspecified: Secondary | ICD-10-CM | POA: Insufficient documentation

## 2019-11-11 DIAGNOSIS — F5105 Insomnia due to other mental disorder: Secondary | ICD-10-CM | POA: Insufficient documentation

## 2019-11-11 MED ORDER — ALPRAZOLAM ER 2 MG PO TB24: 2.0000 mg | ORAL_TABLET | Freq: Every day | ORAL | 0 refills | Status: DC

## 2019-11-11 MED ORDER — CITALOPRAM HYDROBROMIDE 40 MG PO TABS: 40.0000 mg | ORAL_TABLET | Freq: Every day | ORAL | 2 refills | Status: DC

## 2019-11-11 NOTE — Progress Notes (Signed)
Psychiatric Initial Adult Assessment   Patient Identification: Katelyn Hobbs MRN:  [id number] Date of Evaluation:  11/11/2019 Referral Source: Katelyn Hobbs CNM Chief Complaint:  Insomnia, anxiety.  Chief Complaint    Establish Care     Interview was conducted using WebEx teleconferencing application and I verified that I was speaking with the correct person using two identifiers. I discussed the limitations of evaluation and management by telemedicine and  the availability of in person appointments. Patient expressed understanding and agreed to proceed.  Visit Diagnosis:    ICD-10-CM   1. GAD (generalized anxiety disorder)  F41.1   2. Situational depression  F43.21 citalopram (CELEXA) 40 MG tablet  3. Insomnia due to anxiety and fear  F51.05    F40.9     History of Present Illness:  Katelyn Hobbs is a 44 yo divorced white female who has been referred to [place] for treatment of anxiety-triggered insomnia. Patient reports having long-standing problems with falling asleep and waking up few hours after she falls asleep.  She he is now prescribed alprazolam 1 mg which seems to work fairly well: she takes 1 mg at bedtime but then needs another 1/2 of the pill after 3-4 hours because she wakes up. She tends to worry excessively and when she goes to bed she ruminates about things that happened this day and what "wrong": may happen tomorrow. In the past she tended to drink alcohol befoire going to bed to calm self down but realized this is becoming a problem nd stopped a year ago. She did not experience any withdrawal symptoms. Katelyn Hobbs does not reports any panic attacks during the day. In the past she tried zolpidem but felt as though she was hallucinating on that medication.  Katelyn Hobbs has been taking citalopram since 2002. It was originally prescribed for anxiety and some depression associated with a stress of going through international adoption. She and her husband at the time were adopting two children: son  from [place] and daughter from [place]. She does not report any depressive symptoms anymore and she tried to come off citalopram but each time she tried she would feel dizzy and continue to take it. Katelyn Hobbs also has been diagnosed with ADD and was taking Adderall and Vyvanse as an adult. While these medications helped with focusing she became more anxious and eventually stopped taking stimulants in last year. 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 She denies having any hx of mania, psychosis, drug abuse.  Associated Signs/Symptoms: Depression Symptoms:  anxiety, disturbed sleep, (Hypo) Manic Symptoms:  None Anxiety Symptoms:  Excessive Worry, Psychotic Symptoms:  None PTSD Symptoms: NA  Past Psychiatric History: See above; no hx of SI/attempts, no IP psychiatric admissions.  Previous Psychotropic Medications: Yes   Substance Abuse History in the last 12 months:  No.  Consequences of Substance Abuse: Negative  Past Medical History:  Past Medical History:  Diagnosis Date  . Abnormal uterine bleeding   . ADHD (attention deficit hyperactivity disorder)   . Anemia   . Anxiety   . Bacterial vaginosis   . Endometriosis   . Migraine   . PCOS (polycystic ovarian syndrome)     Past Surgical History:  Procedure Laterality Date  . LAPAROSCOPY  1993  . SALPINGECTOMY Bilateral 2015  . VAGINAL HYSTERECTOMY      Family Psychiatric History: Reviewed.  Family History:  Family History  Problem Relation Age of Onset  . Cancer Maternal Grandmother  breast  . Heart disease Maternal Grandmother   . Breast cancer Maternal Grandmother   . Heart disease Paternal Grandmother   . Cancer Paternal Grandfather        colon  . Diabetes Paternal Grandfather   . Bipolar disorder Maternal Grandfather   . Diabetes Father   . Cancer Mother   . Cancer Sister   . ADD / ADHD Sister     Social History:   Social History   Socioeconomic  History  . Marital status: Divorced    Spouse name: Not on file  . Number of children: 2  . Years of education: Not on file  . Highest education level: Not on file  Occupational History  . Occupation: Therapist, sports  Tobacco Use  . Smoking status: Former Research scientist (life sciences)  . Smokeless tobacco: Never Used  Substance and Sexual Activity  . Alcohol use: Not Currently    Alcohol/week: 0.0 standard drinks    Comment: occas  . Drug use: No  . Sexual activity: Yes    Birth control/protection: Surgical  Other Topics Concern  . Not on file  Social History Narrative   2 adopted children: 23 yo son, 38 yo daughter,    Social Determinants of Radio broadcast assistant Strain:   . Difficulty of Paying Living Expenses: Not on file  Food Insecurity:   . Worried About Charity fundraiser in the Last Year: Not on file  . Ran Out of Food in the Last Year: Not on file  Transportation Needs:   . Lack of Transportation (Medical): Not on file  . Lack of Transportation (Non-Medical): Not on file  Physical Activity:   . Days of Exercise per Week: Not on file  . Minutes of Exercise per Session: Not on file  Stress:   . Feeling of Stress : Not on file  Social Connections:   . Frequency of Communication with Friends and Family: Not on file  . Frequency of Social Gatherings with Friends and Family: Not on file  . Attends Religious Services: Not on file  . Active Member of Clubs or Organizations: Not on file  . Attends Archivist Meetings: Not on file  . Marital Status: Not on file    Additional Social History: Divorced 6  years ago after 42 year marriage. She and her husband have a good relationship. She has two adopted children: son 68, daughter 31.   Allergies:  No Known Allergies  Metabolic Disorder Labs: Lab Results  Component Value Date   HGBA1C 5.1 05/17/2018   No results found for: PROLACTIN Lab Results  Component Value Date   CHOL 248 (H) 05/17/2018   TRIG 325 (H) 05/17/2018   HDL 62  05/17/2018   CHOLHDL 4.0 05/17/2018   LDLCALC 121 (H) 05/17/2018   LDLCALC 136 (H) 12/15/2016   Lab Results  Component Value Date   TSH 1.970 05/17/2018    Therapeutic Level Labs: No results found for: LITHIUM No results found for: CBMZ No results found for: VALPROATE  Current Medications: Current Outpatient Medications  Medication Sig Dispense Refill  . ALPRAZolam (XANAX XR) 2 MG 24 hr tablet Take 1 tablet (2 mg total) by mouth at bedtime. 30 tablet 0  . ALPRAZolam (XANAX) 1 MG tablet TAKE 1 TABLET BY MOUTH TWICE A DAY 60 tablet 2  . amphetamine-dextroamphetamine (ADDERALL) 20 MG tablet Take 1 tablet (20 mg total) by mouth 2 (two) times daily. (Patient not taking: Reported on 05/17/2018) 60 tablet 0  .  aspirin 81 MG chewable tablet Chew 1 tablet (81 mg total) by mouth daily. (Patient not taking: Reported on 09/12/2019) 120 tablet 4  . citalopram (CELEXA) 40 MG tablet Take 1 tablet (40 mg total) by mouth daily. 30 tablet 2  . cyanocobalamin (,VITAMIN B-12,) 1000 MCG/ML injection INJECT 1 ML (1,000 MCG TOTAL) INTO THE MUSCLE EVERY 30 (THIRTY) DAYS. (Patient not taking: Reported on 09/12/2019) 3 mL 6  . Ergocalciferol 2500 units CAPS Take 5,000 mg by mouth daily. (Patient not taking: Reported on 09/12/2019) 120 capsule 6  . fluticasone (FLONASE) 50 MCG/ACT nasal spray PLACE 1 SPRAY INTO BOTH NOSTRILS DAILY. (Patient not taking: Reported on 09/12/2019) 48 mL 2  . phentermine (ADIPEX-P) 37.5 MG tablet Take 1 tablet (37.5 mg total) by mouth daily. (Patient not taking: Reported on 09/12/2019) 30 tablet 0  . traMADol (ULTRAM) 50 MG tablet Take 1 tablet (50 mg total) by mouth every 12 (twelve) hours as needed. (Patient not taking: Reported on 09/12/2019) 12 tablet 0   No current facility-administered medications for this visit.     Psychiatric Specialty Exam: Review of Systems  Psychiatric/Behavioral: Positive for sleep disturbance. The patient is nervous/anxious.   All other systems  reviewed and are negative.   There were no vitals taken for this visit.There is no height or weight on file to calculate BMI.  General Appearance: Casual and Well Groomed  Eye Contact:  Good  Speech:  Clear and Coherent and Normal Rate  Volume:  Normal  Mood:  Anxious  Affect:  Full Range  Thought Process:  Goal Directed and Linear  Orientation:  Full (Time, Place, and Person)  Thought Content:  Logical and Rumination  Suicidal Thoughts:  No  Homicidal Thoughts:  No  Memory:  Immediate;   Good Recent;   Good Remote;   Good  Judgement:  Good  Insight:  Good  Psychomotor Activity:  Normal  Concentration:  Concentration: Fair  Recall:  Good  Fund of Knowledge:Good  Language: Good  Akathisia:  Negative  Handed:  Right  AIMS (if indicated):  not done  Assets:  Communication Skills Desire for Improvement Financial Resources/Insurance Housing Social Support Vocational/Educational  ADL's:  Intact  Cognition: WNL  Sleep:  Fair    Assessment and Plan: 44 yo divorced white female who has been referred to [place] for treatment of anxiety-triggered insomnia. Patient reports having long-standing problems with falling asleep and waking up few hours after she falls asleep.  She he is now prescribed alprazolam 1 mg which seems to work fairly well: she takes 1 mg at bedtime but then needs another 1/2 of the pill after 3-4 hours because she wakes up. She tends to worry excessively and when she goes to bed she ruminates about things that happened this day and what "wrong": may happen tomorrow. In the past she tended to drink alcohol befoire going to bed to calm self down but realized this is becoming a problem nd stopped a year ago. She did not experience any withdrawal symptoms. Monnette does not reports any panic attacks during the day. In the past she tried zolpidem but felt as though she was hallucinating on that medication.  Vi has been taking citalopram since 2002. It was originally prescribed  for anxiety and some depression associated with a stress of going through international adoption. She and her husband at the time were adopting two children: son from [place] and daughter from [place]. She does not report any depressive symptoms anymore and she tried to  come off citalopram but each time she tried she would feel dizzy and continue to take it. Kassidey also has been diagnosed with ADD and was taking Adderall and Vyvanse as an adult. While these medications helped with focusing she became more anxious and eventually stopped taking stimulants in last year. 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 She denies having any hx of mania, psychosis, drug abuse.  Dx: GAD; Insomnia; ADD by hx  Plan: We will try XR alprazolam 2 mg instead of IR and continue citalopram unchanged. We may try to switch from citalopram to fluoxetine and try to discontinue that next if Jadan desires to come off a SSRI. She does have some anxiety so I am not sure if this is a right thing to do. Next appointment in one month. The plan was discussed with patient who had an opportunity to ask questions and these were all answered. I spend 60 minutes in videoconferencing with the patient and devoted approximately 50% of this time to explanation of diagnosis, discussion of treatment options and med education.  Stephanie Acre, MD 1/16/202111:31 AM

## 2019-12-09 ENCOUNTER — Other Ambulatory Visit: Payer: Self-pay

## 2019-12-09 ENCOUNTER — Ambulatory Visit (INDEPENDENT_AMBULATORY_CARE_PROVIDER_SITE_OTHER): Payer: BC Managed Care – PPO | Admitting: Psychiatry

## 2019-12-09 DIAGNOSIS — F411 Generalized anxiety disorder: Secondary | ICD-10-CM | POA: Diagnosis not present

## 2019-12-09 DIAGNOSIS — F5105 Insomnia due to other mental disorder: Secondary | ICD-10-CM

## 2019-12-09 DIAGNOSIS — F409 Phobic anxiety disorder, unspecified: Secondary | ICD-10-CM | POA: Diagnosis not present

## 2019-12-09 MED ORDER — ALPRAZOLAM ER 2 MG PO TB24: 2.0000 mg | ORAL_TABLET | Freq: Every day | ORAL | 2 refills | Status: DC

## 2019-12-09 NOTE — Progress Notes (Signed)
BH MD/PA/NP OP Progress Note  12/09/2019 9:12 AM Katelyn Hobbs  MRN:  [id number] Interview was conducted using WebEx teleconferencing application and I verified that I was speaking with the correct person using two identifiers. I discussed the limitations of evaluation and management by telemedicine and  the availability of in person appointments. Patient expressed understanding and agreed to proceed.  Chief Complaint: "I sleep better".  HPI: 44 yo divorced white female who has been referred to [place] for treatment of anxiety-triggered insomnia. Patient reports having long-standing problems with falling asleep and waking up few hours after she falls asleep.  She he is now prescribed alprazolam 1 mg which seems to work fairly well: she takes 1 mg at bedtime but then needs another 1/2 of the pill after 3-4 hours because she wakes up. She tends to worry excessively and when she goes to bed she ruminates about things that happened this day and what "wrong": may happen tomorrow. In the past she tended to drink alcohol befoire going to bed to calm self down but realized this is becoming a problem nd stopped a year ago. She did not experience any withdrawal symptoms. Katelyn Hobbs does not reports any panic attacks during the day. In the past she tried zolpidem but felt as though she was hallucinating on that medication. Katelyn Hobbs has been taking citalopram since 2002. It was originally prescribed for anxiety and some depression associated with a stress of going through international adoption. She and her husband at the time were adopting two children: son from [place] and daughter from [place]. She does not report any depressive symptoms anymore and she tried to come off citalopram but each time she tried she would feel dizzy and continue to take it. Katelyn Hobbs also has been diagnosed with ADD and was taking Adderall and Vyvanse as an adult. While these medications helped with focusing she became more anxious and eventually  stopped taking stimulants in last year. 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 She denies having any hx of mania, psychosis, drug abuse.We have changed regular alprazolam to alprazolam SR 2 mg at night and she no longer is waking up in the middle of the night. She feels more rested in AM. It takes longer for her to fall asleep but now that she realizes it she times taking her medication accordingly. We have decided to continue citalopram unchanged.  Visit Diagnosis:    ICD-10-CM   1. GAD (generalized anxiety disorder)  F41.1   2. Insomnia due to anxiety and fear  F51.05    F40.9     Past Psychiatric History: Please see intake H&P.  Past Medical History:  Past Medical History:  Diagnosis Date  . Abnormal uterine bleeding   . ADHD (attention deficit hyperactivity disorder)   . Anemia   . Anxiety   . Bacterial vaginosis   . Endometriosis   . Migraine   . PCOS (polycystic ovarian syndrome)     Past Surgical History:  Procedure Laterality Date  . LAPAROSCOPY  1993  . SALPINGECTOMY Bilateral 2015  . VAGINAL HYSTERECTOMY      Family Psychiatric History: Reviewed.  Family History:  Family History  Problem Relation Age of Onset  . Cancer Maternal Grandmother        breast  . Heart disease Maternal Grandmother   . Breast cancer Maternal Grandmother   . Heart disease Paternal Grandmother   . Cancer Paternal Grandfather  colon  . Diabetes Paternal Grandfather   . Bipolar disorder Maternal Grandfather   . Diabetes Father   . Cancer Mother   . Cancer Sister   . ADD / ADHD Sister     Social History:  Social History   Socioeconomic History  . Marital status: Divorced    Spouse name: Not on file  . Number of children: 2  . Years of education: Not on file  . Highest education level: Not on file  Occupational History  . Occupation: Therapist, sports  Tobacco Use  . Smoking status: Former Research scientist (life sciences)  . Smokeless tobacco: Never Used   Substance and Sexual Activity  . Alcohol use: Not Currently    Alcohol/week: 0.0 standard drinks    Comment: occas  . Drug use: No  . Sexual activity: Yes    Birth control/protection: Surgical  Other Topics Concern  . Not on file  Social History Narrative   2 adopted children: 6 yo son, 38 yo daughter,    Social Determinants of Radio broadcast assistant Strain:   . Difficulty of Paying Living Expenses: Not on file  Food Insecurity:   . Worried About Charity fundraiser in the Last Year: Not on file  . Ran Out of Food in the Last Year: Not on file  Transportation Needs:   . Lack of Transportation (Medical): Not on file  . Lack of Transportation (Non-Medical): Not on file  Physical Activity:   . Days of Exercise per Week: Not on file  . Minutes of Exercise per Session: Not on file  Stress:   . Feeling of Stress : Not on file  Social Connections:   . Frequency of Communication with Friends and Family: Not on file  . Frequency of Social Gatherings with Friends and Family: Not on file  . Attends Religious Services: Not on file  . Active Member of Clubs or Organizations: Not on file  . Attends Archivist Meetings: Not on file  . Marital Status: Not on file    Allergies: No Known Allergies  Metabolic Disorder Labs: Lab Results  Component Value Date   HGBA1C 5.1 05/17/2018   No results found for: PROLACTIN Lab Results  Component Value Date   CHOL 248 (H) 05/17/2018   TRIG 325 (H) 05/17/2018   HDL 62 05/17/2018   CHOLHDL 4.0 05/17/2018   LDLCALC 121 (H) 05/17/2018   LDLCALC 136 (H) 12/15/2016   Lab Results  Component Value Date   TSH 1.970 05/17/2018    Therapeutic Level Labs: No results found for: LITHIUM No results found for: VALPROATE No components found for:  CBMZ  Current Medications: Current Outpatient Medications  Medication Sig Dispense Refill  . ALPRAZolam (XANAX XR) 2 MG 24 hr tablet Take 1 tablet (2 mg total) by mouth at bedtime. 30  tablet 2  . amphetamine-dextroamphetamine (ADDERALL) 20 MG tablet Take 1 tablet (20 mg total) by mouth 2 (two) times daily. (Patient not taking: Reported on 05/17/2018) 60 tablet 0  . aspirin 81 MG chewable tablet Chew 1 tablet (81 mg total) by mouth daily. (Patient not taking: Reported on 09/12/2019) 120 tablet 4  . citalopram (CELEXA) 40 MG tablet Take 1 tablet (40 mg total) by mouth daily. 30 tablet 2  . cyanocobalamin (,VITAMIN B-12,) 1000 MCG/ML injection INJECT 1 ML (1,000 MCG TOTAL) INTO THE MUSCLE EVERY 30 (THIRTY) DAYS. (Patient not taking: Reported on 09/12/2019) 3 mL 6  . Ergocalciferol 2500 units CAPS Take 5,000 mg by mouth daily. (  Patient not taking: Reported on 09/12/2019) 120 capsule 6  . fluticasone (FLONASE) 50 MCG/ACT nasal spray PLACE 1 SPRAY INTO BOTH NOSTRILS DAILY. (Patient not taking: Reported on 09/12/2019) 48 mL 2  . phentermine (ADIPEX-P) 37.5 MG tablet Take 1 tablet (37.5 mg total) by mouth daily. (Patient not taking: Reported on 09/12/2019) 30 tablet 0  . traMADol (ULTRAM) 50 MG tablet Take 1 tablet (50 mg total) by mouth every 12 (twelve) hours as needed. (Patient not taking: Reported on 09/12/2019) 12 tablet 0   No current facility-administered medications for this visit.      Psychiatric Specialty Exam: Review of Systems  Psychiatric/Behavioral: The patient is nervous/anxious.   All other systems reviewed and are negative.   There were no vitals taken for this visit.There is no height or weight on file to calculate BMI.  General Appearance: Casual and Well Groomed  Eye Contact:  Good  Speech:  Clear and Coherent and Normal Rate  Volume:  Normal  Mood:  Mild anxiety.  Affect:  Full Range  Thought Process:  Goal Directed and Linear  Orientation:  Full (Time, Place, and Person)  Thought Content: Logical   Suicidal Thoughts:  No  Homicidal Thoughts:  No  Memory:  Immediate;   Good Recent;   Good Remote;   Good  Judgement:  Good  Insight:  Good   Psychomotor Activity:  Normal  Concentration:  Concentration: Fair  Recall:  Good  Fund of Knowledge: Good  Language: Good  Akathisia:  Negative  Handed:  Right  AIMS (if indicated): not done  Assets:  Communication Skills Desire for Improvement Financial Resources/Insurance Housing Resilience Talents/Skills  ADL's:  Intact  Cognition: WNL  Sleep:  Good    Assessment and Plan: 44 yo divorced white female who has been referred to [place] for treatment of anxiety-triggered insomnia. Patient reports having long-standing problems with falling asleep and waking up few hours after she falls asleep.  She he has been prescribed alprazolam 1 mg which seems to work fairly well: she took 1 mg at bedtime but then needed another 1/2 of the pill after 3-4 hours because she wakes up. She tends to worry excessively and when she goes to bed she ruminates about things that happened this day and what "wrong": may happen tomorrow. In the past she tended to drink alcohol before going to bed to calm self down but realized this is becoming a problem and stopped a year ago. She did not experience any withdrawal symptoms. Shalissa does not reports any panic attacks during the day. In the past she tried zolpidem but felt as though she was hallucinating on that medication. Onedia has been taking citalopram since 2002. It was originally prescribed for anxiety and some depression associated with a stress of going through international adoption. She and her husband at the time were adopting two children: son from [place] and daughter from [place]. She does not report any depressive symptoms anymore and she tried to come off citalopram but each time she tried she would feel dizzy and continue to take it. Kynzli also has been diagnosed with ADD and was taking Adderall and Vyvanse as an adult. While these medications helped with focusing she became more anxious and eventually stopped taking stimulants in last year. 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 She denies having any hx of mania, psychosis, drug abuse.We have changed regular alprazolam to alprazolam SR 2 mg at night  and she no longer is waking up in the middle of the night. She feels more rested in AM. It takes longer for her to fall asleep but now that she realizes it she times taking her medication accordingly. We have decided to continue citalopram unchanged.  Dx: GAD; Insomnia; ADD by hx  Plan: We will continue alprazolam SR 2 mg at HS and citalopram unchanged. Next appointment in three months. The plan was discussed with patient who had an opportunity to ask questions and these were all answered. I spend 20 minutes in videoconferencing with the patient.    Stephanie Acre, MD 12/09/2019, 9:12 AM

## 2019-12-12 ENCOUNTER — Ambulatory Visit (HOSPITAL_COMMUNITY): Payer: BC Managed Care – PPO | Admitting: Psychiatry

## 2020-01-10 DIAGNOSIS — M545 Low back pain: Secondary | ICD-10-CM | POA: Diagnosis not present

## 2020-02-29 ENCOUNTER — Other Ambulatory Visit (HOSPITAL_COMMUNITY): Payer: Self-pay | Admitting: *Deleted

## 2020-02-29 DIAGNOSIS — F4321 Adjustment disorder with depressed mood: Secondary | ICD-10-CM

## 2020-02-29 MED ORDER — ALPRAZOLAM ER 2 MG PO TB24: 2.0000 mg | ORAL_TABLET | Freq: Every day | ORAL | 2 refills | Status: DC

## 2020-02-29 MED ORDER — CITALOPRAM HYDROBROMIDE 40 MG PO TABS: 40.0000 mg | ORAL_TABLET | Freq: Every day | ORAL | 2 refills | Status: DC

## 2020-03-05 ENCOUNTER — Other Ambulatory Visit: Payer: Self-pay

## 2020-03-05 ENCOUNTER — Telehealth (INDEPENDENT_AMBULATORY_CARE_PROVIDER_SITE_OTHER): Payer: BC Managed Care – PPO | Admitting: Psychiatry

## 2020-03-05 DIAGNOSIS — F411 Generalized anxiety disorder: Secondary | ICD-10-CM | POA: Diagnosis not present

## 2020-03-05 NOTE — Progress Notes (Signed)
BH MD/PA/NP OP Progress Note  03/05/2020 3:37 PM MONIQUA WELTZIN  MRN:  [id number] Interview was conducted using videoconferencing application and I verified that I was speaking with the correct person using two identifiers. I discussed the limitations of evaluation and management by telemedicine and  the availability of in person appointments. Patient expressed understanding and agreed to proceed.  Chief Complaint: "I am doing very well".  HPI: 44 yo divorced white female who has been referred to [place] for treatment of anxiety-triggered insomnia. Patient reports having long-standing problems with falling asleep and waking up few hours after she falls asleep. She he has been prescribed alprazolam 1 mg which seems to work fairly well: she took 1 mg at bedtime but then needed another 1/2 of the pill after 3-4 hours because she wakes up. She tended to worry excessively and when she goes to bed she would ruminate about things that happened this day and what "wrong": may happen tomorrow. In the past she tended to drink alcohol before going to bed to calm self down but realized this is becoming a problem and stopped a year ago. She did not experience any withdrawal symptoms. Chemika does not reports any panic attacks during the day. In the past she tried zolpidem but felt as though she was hallucinating on that medication. Maryiah has been taking citalopram since 2002. It was originally prescribed for anxiety and some depression associated with a stress of going through international adoption. She and her husband at the time were adopting two children: son from [place] and daughter from [place]. She does not report any depressive symptoms anymore and she tried to come off citalopram but each time she tried she would feel dizzy and continue to take it. Keiasia also has been diagnosed with ADD and was taking Adderall and Vyvanse as an adult. While these medications helped with focusing she became more anxious and  eventually stopped taking stimulants in last year. 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 She denies having any hx of mania, psychosis, drug abuse.We have changed regular alprazolam to alprazolam SR 2 mg at night and she no longer is waking up in the middle of the night. She feels more rested in AM. It takes longer for her to fall asleep but now that she realizes it she times taking her medication accordingly. We have decided to continue citalopram unchanged. She denies having any anxiety or feeling depressed.  Visit Diagnosis:    ICD-10-CM   1. GAD (generalized anxiety disorder)  F41.1     Past Psychiatric History: Please see intake H&P.  Past Medical History:  Past Medical History:  Diagnosis Date  . Abnormal uterine bleeding   . ADHD (attention deficit hyperactivity disorder)   . Anemia   . Anxiety   . Bacterial vaginosis   . Endometriosis   . Migraine   . PCOS (polycystic ovarian syndrome)     Past Surgical History:  Procedure Laterality Date  . LAPAROSCOPY  1993  . SALPINGECTOMY Bilateral 2015  . VAGINAL HYSTERECTOMY      Family Psychiatric History: Reviewed.  Family History:  Family History  Problem Relation Age of Onset  . Cancer Maternal Grandmother        breast  . Heart disease Maternal Grandmother   . Breast cancer Maternal Grandmother   . Heart disease Paternal Grandmother   . Cancer Paternal Grandfather        colon  .  Diabetes Paternal Grandfather   . Bipolar disorder Maternal Grandfather   . Diabetes Father   . Cancer Mother   . Cancer Sister   . ADD / ADHD Sister     Social History:  Social History   Socioeconomic History  . Marital status: Divorced    Spouse name: Not on file  . Number of children: 2  . Years of education: Not on file  . Highest education level: Not on file  Occupational History  . Occupation: Therapist, sports  Tobacco Use  . Smoking status: Former Research scientist (life sciences)  . Smokeless tobacco: Never  Used  Substance and Sexual Activity  . Alcohol use: Not Currently    Alcohol/week: 0.0 standard drinks    Comment: occas  . Drug use: No  . Sexual activity: Yes    Birth control/protection: Surgical  Other Topics Concern  . Not on file  Social History Narrative   2 adopted children: 1 yo son, 70 yo daughter,    Social Determinants of Radio broadcast assistant Strain:   . Difficulty of Paying Living Expenses:   Food Insecurity:   . Worried About Charity fundraiser in the Last Year:   . Arboriculturist in the Last Year:   Transportation Needs:   . Film/video editor (Medical):   Marland Kitchen Lack of Transportation (Non-Medical):   Physical Activity:   . Days of Exercise per Week:   . Minutes of Exercise per Session:   Stress:   . Feeling of Stress :   Social Connections:   . Frequency of Communication with Friends and Family:   . Frequency of Social Gatherings with Friends and Family:   . Attends Religious Services:   . Active Member of Clubs or Organizations:   . Attends Archivist Meetings:   Marland Kitchen Marital Status:     Allergies: No Known Allergies  Metabolic Disorder Labs: Lab Results  Component Value Date   HGBA1C 5.1 05/17/2018   No results found for: PROLACTIN Lab Results  Component Value Date   CHOL 248 (H) 05/17/2018   TRIG 325 (H) 05/17/2018   HDL 62 05/17/2018   CHOLHDL 4.0 05/17/2018   LDLCALC 121 (H) 05/17/2018   LDLCALC 136 (H) 12/15/2016   Lab Results  Component Value Date   TSH 1.970 05/17/2018    Therapeutic Level Labs: No results found for: LITHIUM No results found for: VALPROATE No components found for:  CBMZ  Current Medications: Current Outpatient Medications  Medication Sig Dispense Refill  . ALPRAZolam (XANAX XR) 2 MG 24 hr tablet Take 1 tablet (2 mg total) by mouth at bedtime. 30 tablet 2  . amphetamine-dextroamphetamine (ADDERALL) 20 MG tablet Take 1 tablet (20 mg total) by mouth 2 (two) times daily. (Patient not taking:  Reported on 05/17/2018) 60 tablet 0  . aspirin 81 MG chewable tablet Chew 1 tablet (81 mg total) by mouth daily. (Patient not taking: Reported on 09/12/2019) 120 tablet 4  . citalopram (CELEXA) 40 MG tablet Take 1 tablet (40 mg total) by mouth daily. 30 tablet 2  . cyanocobalamin (,VITAMIN B-12,) 1000 MCG/ML injection INJECT 1 ML (1,000 MCG TOTAL) INTO THE MUSCLE EVERY 30 (THIRTY) DAYS. (Patient not taking: Reported on 09/12/2019) 3 mL 6  . Ergocalciferol 2500 units CAPS Take 5,000 mg by mouth daily. (Patient not taking: Reported on 09/12/2019) 120 capsule 6  . fluticasone (FLONASE) 50 MCG/ACT nasal spray PLACE 1 SPRAY INTO BOTH NOSTRILS DAILY. (Patient not taking: Reported on 09/12/2019) 48  mL 2  . phentermine (ADIPEX-P) 37.5 MG tablet Take 1 tablet (37.5 mg total) by mouth daily. (Patient not taking: Reported on 09/12/2019) 30 tablet 0  . traMADol (ULTRAM) 50 MG tablet Take 1 tablet (50 mg total) by mouth every 12 (twelve) hours as needed. (Patient not taking: Reported on 09/12/2019) 12 tablet 0   No current facility-administered medications for this visit.     Psychiatric Specialty Exam: Review of Systems  All other systems reviewed and are negative.   There were no vitals taken for this visit.There is no height or weight on file to calculate BMI.  General Appearance: Casual and Well Groomed  Eye Contact:  Good  Speech:  Clear and Coherent and Normal Rate  Volume:  Normal  Mood:  Euthymic  Affect:  Full Range  Thought Process:  Goal Directed and Linear  Orientation:  Full (Time, Place, and Person)  Thought Content: Logical   Suicidal Thoughts:  No  Homicidal Thoughts:  No  Memory:  Immediate;   Good Recent;   Good Remote;   Good  Judgement:  Good  Insight:  Good  Psychomotor Activity:  Normal  Concentration:  Concentration: Good  Recall:  Good  Fund of Knowledge: Good  Language: Good  Akathisia:  Negative  Handed:  Right  AIMS (if indicated): not done  Assets:   Communication Skills Desire for Improvement Financial Resources/Insurance Housing Physical Health Talents/Skills  ADL's:  Intact  Cognition: WNL  Sleep:  Good    Assessment and Plan: 43 yo divorced white female who has been referred to [place] for treatment of anxiety-triggered insomnia. Patient reports having long-standing problems with falling asleep and waking up few hours after she falls asleep. She he has been prescribed alprazolam 1 mg which seems to work fairly well: she took 1 mg at bedtime but then needed another 1/2 of the pill after 3-4 hours because she wakes up. She tended to worry excessively and when she goes to bed she would ruminate about things that happened this day and what "wrong": may happen tomorrow. In the past she tended to drink alcohol before going to bed to calm self down but realized this is becoming a problem and stopped a year ago. She did not experience any withdrawal symptoms. Liylah does not reports any panic attacks during the day. In the past she tried zolpidem but felt as though she was hallucinating on that medication. Tristine has been taking citalopram since 2002. It was originally prescribed for anxiety and some depression associated with a stress of going through international adoption. She and her husband at the time were adopting two children: son from [place] and daughter from [place]. She does not report any depressive symptoms anymore and she tried to come off citalopram but each time she tried she would feel dizzy and continue to take it. Elveria also has been diagnosed with ADD and was taking Adderall and Vyvanse as an adult. While these medications helped with focusing she became more anxious and eventually stopped taking stimulants in last year. 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 She denies having any hx of mania, psychosis, drug abuse.We have changed regular alprazolam to alprazolam SR 2 mg at night and  she no longer is waking up in the middle of the night. She feels more rested in AM. It takes longer for her to fall asleep but now that she realizes it she times taking her medication accordingly.  We have decided to continue citalopram unchanged. She denies having any anxiety or feeling depressed.  Dx: GAD; Insomnia; ADD by hx  Plan: We will continue alprazolam SR 2 mg at HS and citalopram unchanged. Next appointment in three months.The plan was discussed with patient who had an opportunity to ask questions and these were all answered. I spend66minutes in videoconferencingwith the patient.     Stephanie Acre, MD 03/05/2020, 3:37 PM

## 2020-03-07 ENCOUNTER — Ambulatory Visit (HOSPITAL_COMMUNITY): Payer: BC Managed Care – PPO | Admitting: Psychiatry

## 2020-04-16 ENCOUNTER — Encounter: Payer: Self-pay | Admitting: Internal Medicine

## 2020-04-16 ENCOUNTER — Other Ambulatory Visit: Payer: Self-pay

## 2020-04-16 ENCOUNTER — Ambulatory Visit: Payer: BC Managed Care – PPO | Admitting: Internal Medicine

## 2020-04-16 VITALS — BP 120/70 | HR 84 | Temp 97.6°F | Resp 15 | Ht 66.0 in | Wt 204.2 lb

## 2020-04-16 DIAGNOSIS — E538 Deficiency of other specified B group vitamins: Secondary | ICD-10-CM

## 2020-04-16 DIAGNOSIS — I471 Supraventricular tachycardia, unspecified: Secondary | ICD-10-CM | POA: Insufficient documentation

## 2020-04-16 DIAGNOSIS — F411 Generalized anxiety disorder: Secondary | ICD-10-CM

## 2020-04-16 DIAGNOSIS — Z113 Encounter for screening for infections with a predominantly sexual mode of transmission: Secondary | ICD-10-CM

## 2020-04-16 DIAGNOSIS — E282 Polycystic ovarian syndrome: Secondary | ICD-10-CM

## 2020-04-16 DIAGNOSIS — E669 Obesity, unspecified: Secondary | ICD-10-CM

## 2020-04-16 DIAGNOSIS — E559 Vitamin D deficiency, unspecified: Secondary | ICD-10-CM

## 2020-04-16 DIAGNOSIS — Z90710 Acquired absence of both cervix and uterus: Secondary | ICD-10-CM

## 2020-04-16 MED ORDER — DILTIAZEM HCL 30 MG PO TABS: ORAL_TABLET | ORAL | 0 refills | Status: DC

## 2020-04-16 NOTE — Assessment & Plan Note (Addendum)
Managed with alprazolam XR 2 mg qhs  and celexa.  No changes today

## 2020-04-16 NOTE — Assessment & Plan Note (Signed)
Secondary to fibroid uterus , endometriosis and DUB.

## 2020-04-16 NOTE — Assessment & Plan Note (Addendum)
Previously treated with spironolactone.  Screening for DM has been negative. .  Lipids  Pending  Lab Results  Component Value Date   HGBA1C 5.1 05/17/2018

## 2020-04-16 NOTE — Progress Notes (Addendum)
Subjective:  Patient ID: Katelyn Hobbs, female    DOB: 04-27-1976  Age: 44 y.o. MRN: [id number]  CC: The primary encounter diagnosis was Screen for STD (sexually transmitted disease). Diagnoses of B12 deficiency, Vitamin D deficiency, Paroxysmal supraventricular tachycardia ([facility]), PCOS (polycystic ovarian syndrome), Paroxysmal SVT (supraventricular tachycardia) ([facility]), GAD (generalized anxiety disorder), S/P total hysterectomy, and Obesity (BMI 30.0-34.9) were also pertinent to this visit.   HPI Katelyn Hobbs presents for establishment of care.   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    She is requesting screening for STDS because she had a nasty breakup with her old  boyfriend and right after the breakup received  an anonymous text warning her of STD exposure.  She has not had unprotected sex since her last encounter with him and has no symptoms of infection .   Chronic Vit d deficiency used to be on the mega dose weekly,  Was not covered by insurance so has been taking otc supplements occasionally , not regularly   PSVT , chronic, occurs several times per week, occurring  At rest. But not  with sleep.  Present since high school.  ran track in high school, never noticed it during running.  Doesn't exercise currently .  No prior cardiology evaluation . discussed referral to cardiology [place].   History of covid 19 infection in November.  Tested negative,  but son had tested  positive and she developed symptoms shortly  After having contact with him .  Was Sick for a week. Fatigue,  High fevers,  Hair loss.  She has been immunized.    History Katelyn Hobbs has a past medical history of Abnormal uterine bleeding, ADHD (attention deficit hyperactivity disorder), Anemia, Anxiety, Bacterial vaginosis, Depression, Endometriosis, Hyperlipidemia, Migraine, and PCOS (polycystic ovarian syndrome).   She has a past surgical history that includes Salpingectomy (Bilateral, 2015);  laparoscopy (1993); and Vaginal hysterectomy (2017).   Her family history includes ADD / ADHD in her sister; Bipolar disorder in her maternal grandfather; Breast cancer in her maternal grandmother; Cancer in her maternal grandmother, paternal grandfather, and sister; Cancer (age of onset: 34) in her mother; Diabetes in her father, paternal grandfather, and sister; Heart disease in her maternal grandmother and paternal grandmother; Hyperlipidemia in her father; Hypertension in her father and sister.She reports that she has quit smoking. She has never used smokeless tobacco. She reports current alcohol use. She reports that she does not use drugs.  Outpatient Medications Prior to Visit  Medication Sig Dispense Refill  . ALPRAZolam (XANAX XR) 2 MG 24 hr tablet Take 1 tablet (2 mg total) by mouth at bedtime. 30 tablet 2  . cetirizine (ZYRTEC) 10 MG tablet Take 10 mg by mouth daily.    . cholecalciferol (VITAMIN D3) 25 MCG (1000 UNIT) tablet Take 1,000 Units by mouth daily.    . citalopram (CELEXA) 40 MG tablet Take 1 tablet (40 mg total) by mouth daily. 30 tablet 2  . cyanocobalamin (,VITAMIN B-12,) 1000 MCG/ML injection INJECT 1 ML (1,000 MCG TOTAL) INTO THE MUSCLE EVERY 30 (THIRTY) DAYS. (Patient not taking: Reported on 04/16/2020) 3 mL 6  . amphetamine-dextroamphetamine (ADDERALL) 20 MG tablet Take 1 tablet (20 mg total) by mouth 2 (two) times daily. (Patient not taking: Reported on 05/17/2018) 60 tablet 0  . aspirin 81 MG chewable tablet Chew 1 tablet (81 mg total) by mouth daily. (Patient not taking: Reported on 09/12/2019) 120 tablet 4  . Ergocalciferol 2500 units  CAPS Take 5,000 mg by mouth daily. (Patient not taking: Reported on 09/12/2019) 120 capsule 6  . fluticasone (FLONASE) 50 MCG/ACT nasal spray PLACE 1 SPRAY INTO BOTH NOSTRILS DAILY. (Patient not taking: Reported on 09/12/2019) 48 mL 2  . phentermine (ADIPEX-P) 37.5 MG tablet Take 1 tablet (37.5 mg total) by mouth daily. (Patient not taking:  Reported on 09/12/2019) 30 tablet 0  . spironolactone (ALDACTONE) 50 MG tablet Take 50 mg by mouth 2 (two) times daily. (Patient not taking: Reported on 04/16/2020)    . traMADol (ULTRAM) 50 MG tablet Take 1 tablet (50 mg total) by mouth every 12 (twelve) hours as needed. (Patient not taking: Reported on 09/12/2019) 12 tablet 0   No facility-administered medications prior to visit.    Review of Systems:  Patient denies headache, fevers, malaise, unintentional weight loss, skin rash, eye pain, sinus congestion and sinus pain, sore throat, dysphagia,  hemoptysis , cough, dyspnea, wheezing, chest pain, palpitations, orthopnea, edema, abdominal pain, nausea, melena, diarrhea, constipation, flank pain, dysuria, hematuria, urinary  Frequency, nocturia, numbness, tingling, seizures,  Focal weakness, Loss of consciousness,  Tremor, insomnia, depression, anxiety, and suicidal ideation.     Objective:  BP 120/70 (BP Location: Left Arm, Patient Position: Sitting, Cuff Size: Normal)   Pulse 84   Temp 97.6 F (36.4 C) (Temporal)   Resp 15   Ht 5\' 6"  (1.676 m)   Wt 204 lb 3.2 oz (92.6 kg)   SpO2 96%   BMI 32.96 kg/m   Physical Exam:  General appearance: alert, cooperative and appears stated age Ears: normal TM's and external ear canals both ears Throat: lips, mucosa, and tongue normal; teeth and gums normal Neck: no adenopathy, no carotid bruit, supple, symmetrical, trachea midline and thyroid not enlarged, symmetric, no tenderness/mass/nodules Back: symmetric, no curvature. ROM normal. No CVA tenderness. Lungs: clear to auscultation bilaterally Heart: regular rate and rhythm, S1, S2 normal, no murmur, click, rub or gallop Abdomen: soft, non-tender; bowel sounds normal; no masses,  no organomegaly Pulses: 2+ and symmetric Skin: Skin color, texture, turgor normal. No rashes or lesions Lymph nodes: Cervical, supraclavicular, and axillary nodes normal.   Assessment & Plan:   Problem List Items  Addressed This Visit      Unprioritized   Vitamin D deficiency    rx 50K IUs weekly x 3 months       Relevant Orders   VITAMIN D 25 Hydroxy (Vit-D Deficiency, Fractures) (Completed)   Screen for STD (sexually transmitted disease) - Primary    Requested due to an anonymous text suggesting exposure      Relevant Orders   HIV Antibody (routine testing w rflx) (Completed)   Hepatitis C antibody (Completed)   S/P total hysterectomy    Secondary to fibroid uterus , endometriosis and DUB.       PCOS (polycystic ovarian syndrome)    Previously treated with spironolactone.  Screening for DM has been negative. .  Lipids  Pending  Lab Results  Component Value Date   HGBA1C 5.1 05/17/2018         Paroxysmal SVT (supraventricular tachycardia) ([facility])    Occurring several times weekly,  With no prior cardiac evaluation to rule out cardiomyopathy and atrial fib.  cardizem 30 mg po q 6 hors prn.  cardiiology referral       Relevant Medications   diltiazem (CARDIZEM) 30 MG tablet   Obesity (BMI 30.0-34.9)    I have addressed  BMI and recommended wt loss of 10% of body  weigh over the next 6 months using a low glycemic index diet and regular exercise a minimum of 5 days per week.        GAD (generalized anxiety disorder)    Managed with alprazolam XR 2 mg qhs  and celexa.  No changes today        Other Visit Diagnoses    B12 deficiency       Relevant Orders   CBC with Differential/Platelet (Completed)   Vitamin B12 (Completed)   Methylmalonic Acid   Intrinsic Factor Antibodies   Paroxysmal supraventricular tachycardia ([facility])       Relevant Medications   diltiazem (CARDIZEM) 30 MG tablet   Other Relevant Orders   TSH (Completed)      I have discontinued Thayer Headings C. Sivertson's amphetamine-dextroamphetamine, aspirin, Ergocalciferol, traMADol, phentermine, fluticasone, and spironolactone. I am also having her start on diltiazem and ergocalciferol. Additionally, I am having her  maintain her cyanocobalamin, ALPRAZolam, citalopram, cholecalciferol, and cetirizine.  Meds ordered this encounter  Medications  . diltiazem (CARDIZEM) 30 MG tablet    Sig: As needed for tachycardia    Dispense:  30 tablet    Refill:  0  . ergocalciferol (VITAMIN D2) 1.25 MG (50000 UT) capsule    Sig: Take 1 capsule (50,000 Units total) by mouth once a week.    Dispense:  12 capsule    Refill:  0    Medications Discontinued During This Encounter  Medication Reason  . amphetamine-dextroamphetamine (ADDERALL) 20 MG tablet Patient has not taken in last 30 days  . aspirin 81 MG chewable tablet Patient has not taken in last 30 days  . Ergocalciferol 2500 units CAPS Patient has not taken in last 30 days  . fluticasone (FLONASE) 50 MCG/ACT nasal spray Patient has not taken in last 30 days  . phentermine (ADIPEX-P) 37.5 MG tablet Patient has not taken in last 30 days  . traMADol (ULTRAM) 50 MG tablet Patient has not taken in last 30 days  . spironolactone (ALDACTONE) 50 MG tablet     Follow-up: No follow-ups on file.   Crecencio Mc, MD

## 2020-04-16 NOTE — Assessment & Plan Note (Signed)
Requested due to an anonymous text suggesting exposure

## 2020-04-16 NOTE — Assessment & Plan Note (Signed)
Occurring several times weekly,  With no prior cardiac evaluation to rule out cardiomyopathy and atrial fib.  cardizem 30 mg po q 6 hors prn.  cardiiology referral

## 2020-04-16 NOTE — Assessment & Plan Note (Signed)
I have addressed  BMI and recommended wt loss of 10% of body weigh over the next 6 months using a low glycemic index diet and regular exercise a minimum of 5 days per week.   

## 2020-04-16 NOTE — Patient Instructions (Addendum)
Cardiology referral in progress  Use cardizem for next episode of SVT   Premier protein and intermittent fasting    Labs today   Your annual mammogram has been ordered.  You are encouraged (required) to call to make your appointment at [facility] [phone]

## 2020-04-17 LAB — CBC WITH DIFFERENTIAL/PLATELET
Basophils Absolute: 0.1 10*3/uL (ref 0.0–0.1)
Basophils Relative: 1.2 % (ref 0.0–3.0)
Eosinophils Absolute: 0.3 10*3/uL (ref 0.0–0.7)
Eosinophils Relative: 3.9 % (ref 0.0–5.0)
HCT: 40.8 % (ref 36.0–46.0)
Hemoglobin: 14.2 g/dL (ref 12.0–15.0)
Lymphocytes Relative: 22.8 % (ref 12.0–46.0)
Lymphs Abs: 1.8 10*3/uL (ref 0.7–4.0)
MCHC: 34.8 g/dL (ref 30.0–36.0)
MCV: 96.6 fl (ref 78.0–100.0)
Monocytes Absolute: 0.6 10*3/uL (ref 0.1–1.0)
Monocytes Relative: 7.7 % (ref 3.0–12.0)
Neutro Abs: 5.1 10*3/uL (ref 1.4–7.7)
Neutrophils Relative %: 64.4 % (ref 43.0–77.0)
Platelets: 221 10*3/uL (ref 150.0–400.0)
RBC: 4.22 Mil/uL (ref 3.87–5.11)
RDW: 13 % (ref 11.5–15.5)
WBC: 7.8 10*3/uL (ref 4.0–10.5)

## 2020-04-17 LAB — VITAMIN B12: Vitamin B-12: 305 pg/mL (ref 211–911)

## 2020-04-17 LAB — TSH: TSH: 2.33 u[IU]/mL (ref 0.35–4.50)

## 2020-04-17 LAB — VITAMIN D 25 HYDROXY (VIT D DEFICIENCY, FRACTURES): VITD: 13.9 ng/mL — ABNORMAL LOW (ref 30.00–100.00)

## 2020-04-18 MED ORDER — ERGOCALCIFEROL 1.25 MG (50000 UT) PO CAPS: 50000.0000 [IU] | ORAL_CAPSULE | ORAL | 0 refills | Status: DC

## 2020-04-18 NOTE — Assessment & Plan Note (Signed)
rx 50K IUs weekly x 3 months

## 2020-04-18 NOTE — Addendum Note (Signed)
Addended by: Crecencio Mc on: 04/18/2020 02:26 PM   Modules accepted: Orders

## 2020-04-19 LAB — INTRINSIC FACTOR ANTIBODIES: Intrinsic Factor: NEGATIVE

## 2020-04-19 LAB — HEPATITIS C ANTIBODY
Hepatitis C Ab: NONREACTIVE
SIGNAL TO CUT-OFF: 0.01 (ref <1.00)

## 2020-04-19 LAB — HIV ANTIBODY (ROUTINE TESTING W REFLEX): HIV 1&2 Ab, 4th Generation: NONREACTIVE

## 2020-04-19 LAB — METHYLMALONIC ACID, SERUM: Methylmalonic Acid, Quant: 153 nmol/L (ref 87–318)

## 2020-04-30 ENCOUNTER — Other Ambulatory Visit: Payer: Self-pay | Admitting: Internal Medicine

## 2020-05-28 ENCOUNTER — Other Ambulatory Visit: Payer: Self-pay

## 2020-05-28 ENCOUNTER — Telehealth (INDEPENDENT_AMBULATORY_CARE_PROVIDER_SITE_OTHER): Payer: BC Managed Care – PPO | Admitting: Psychiatry

## 2020-05-28 DIAGNOSIS — F4321 Adjustment disorder with depressed mood: Secondary | ICD-10-CM | POA: Diagnosis not present

## 2020-05-28 DIAGNOSIS — F411 Generalized anxiety disorder: Secondary | ICD-10-CM

## 2020-05-28 MED ORDER — CITALOPRAM HYDROBROMIDE 40 MG PO TABS: 40.0000 mg | ORAL_TABLET | Freq: Every day | ORAL | 1 refills | Status: DC

## 2020-05-28 MED ORDER — ALPRAZOLAM ER 2 MG PO TB24: 2.0000 mg | ORAL_TABLET | Freq: Every day | ORAL | 1 refills | Status: DC

## 2020-05-28 NOTE — Progress Notes (Signed)
[facility] MD/PA/NP OP Progress Note  05/28/2020 3:42 PM Katelyn Hobbs  MRN:  [id number] Interview was conducted using videoconferencing application and I verified that I was speaking with the correct person using two identifiers. I discussed the limitations of evaluation and management by telemedicine and  the availability of in person appointments. Patient expressed understanding and agreed to proceed. Patient location - home; physician - home office.  Chief Complaint: "I am doing well".  HPI: 44 yo divorced white female with a long-standing problems with falling asleep and waking up few hours after she falls asleep. She hehas beenprescribed alprazolam 1 mg which seems to work fairly well: she took1 mg at bedtime but then neededanother 1/2 of the pill after 3-4 hours because she wakes up. She tended to worry excessively and when she goes to bed she would ruminate about things that happened this day and what "wrong": may happen tomorrow. In the past she tended to drink alcohol before going to bed to calm self down but realized this is becoming a problem and stopped a year ago. She did not experience any withdrawal symptoms. Damiana does not reports any panic attacks during the day. In the past she tried zolpidem but felt as though she was hallucinating on that medication. Kristyne has been taking citalopram since 2002. It was originally prescribed for anxiety and some depression associated with a stress of going through international adoption. She and her husband at the time were adopting two children: son from [place] and daughter from [place]. She does not report any depressive symptoms anymore and she tried to come off citalopram but each time she tried she would feel dizzy and continue to take it. Sama also has been diagnosed with ADD and was taking Adderall and Vyvanse as an adult. While these medications helped with focusing she became more anxious and eventually stopped taking stimulants in last  year. 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 She denies having any hx of mania, psychosis, drug abuse.We have changed regular alprazolam to alprazolam SR 2 mg at night and she no longer is waking up in the middle of the night. She feels more rested in AM. It takes longer for her to fall asleep but now that she realizes it she times taking her medication accordingly. She has decided to continue citalopram unchanged. She denies having any anxiety or depression.  Visit Diagnosis:    ICD-10-CM   1. GAD (generalized anxiety disorder)  F41.1   2. Situational depression  F43.21 citalopram (CELEXA) 40 MG tablet    Past Psychiatric History: Please see intake H&P.  Past Medical History:  Past Medical History:  Diagnosis Date  . Abnormal uterine bleeding   . ADHD (attention deficit hyperactivity disorder)   . Anemia   . Anxiety   . Bacterial vaginosis   . Depression   . Endometriosis   . Hyperlipidemia   . Migraine   . PCOS (polycystic ovarian syndrome)     Past Surgical History:  Procedure Laterality Date  . LAPAROSCOPY  1993  . SALPINGECTOMY Bilateral 2015  . VAGINAL HYSTERECTOMY  2017    Family Psychiatric History: Reviewed.  Family History:  Family History  Problem Relation Age of Onset  . Cancer Maternal Grandmother        breast  . Heart disease Maternal Grandmother   . Breast cancer Maternal Grandmother   . Heart disease Paternal Grandmother   . Cancer Paternal Grandfather  colon  . Diabetes Paternal Grandfather   . Bipolar disorder Maternal Grandfather   . Diabetes Father   . Hyperlipidemia Father   . Hypertension Father   . Cancer Mother 68       uterine   . Cancer Sister   . ADD / ADHD Sister   . Diabetes Sister   . Hypertension Sister     Social History:  Social History   Socioeconomic History  . Marital status: Divorced    Spouse name: Not on file  . Number of children: 2  . Years of education:  Not on file  . Highest education level: Not on file  Occupational History  . Occupation: Therapist, sports  Tobacco Use  . Smoking status: Former Research scientist (life sciences)  . Smokeless tobacco: Never Used  Vaping Use  . Vaping Use: Never used  Substance and Sexual Activity  . Alcohol use: Yes    Alcohol/week: 0.0 standard drinks    Comment: occas  . Drug use: No  . Sexual activity: Yes    Birth control/protection: Surgical  Other Topics Concern  . Not on file  Social History Narrative   2 adopted children: 56 yo son, 48 yo daughter,    Social Determinants of Radio broadcast assistant Strain:   . Difficulty of Paying Living Expenses:   Food Insecurity:   . Worried About Charity fundraiser in the Last Year:   . Arboriculturist in the Last Year:   Transportation Needs:   . Film/video editor (Medical):   Marland Kitchen Lack of Transportation (Non-Medical):   Physical Activity:   . Days of Exercise per Week:   . Minutes of Exercise per Session:   Stress:   . Feeling of Stress :   Social Connections:   . Frequency of Communication with Friends and Family:   . Frequency of Social Gatherings with Friends and Family:   . Attends Religious Services:   . Active Member of Clubs or Organizations:   . Attends Archivist Meetings:   Marland Kitchen Marital Status:     Allergies: No Known Allergies  Metabolic Disorder Labs: Lab Results  Component Value Date   HGBA1C 5.1 05/17/2018   No results found for: PROLACTIN Lab Results  Component Value Date   CHOL 248 (H) 05/17/2018   TRIG 325 (H) 05/17/2018   HDL 62 05/17/2018   CHOLHDL 4.0 05/17/2018   LDLCALC 121 (H) 05/17/2018   LDLCALC 136 (H) 12/15/2016   Lab Results  Component Value Date   TSH 2.33 04/16/2020   TSH 1.970 05/17/2018    Therapeutic Level Labs: No results found for: LITHIUM No results found for: VALPROATE No components found for:  CBMZ  Current Medications: Current Outpatient Medications  Medication Sig Dispense Refill  . ALPRAZolam  (XANAX XR) 2 MG 24 hr tablet Take 1 tablet (2 mg total) by mouth at bedtime. 90 tablet 1  . cetirizine (ZYRTEC) 10 MG tablet Take 10 mg by mouth daily.    . cholecalciferol (VITAMIN D3) 25 MCG (1000 UNIT) tablet Take 1,000 Units by mouth daily.    . citalopram (CELEXA) 40 MG tablet Take 1 tablet (40 mg total) by mouth daily. 90 tablet 1  . cyanocobalamin (,VITAMIN B-12,) 1000 MCG/ML injection INJECT 1 ML (1,000 MCG TOTAL) INTO THE MUSCLE EVERY 30 (THIRTY) DAYS. (Patient not taking: Reported on 04/16/2020) 3 mL 6  . diltiazem (CARDIZEM) 30 MG tablet TAKE 1 TABLET BY MOUTH AS NEEDED FOR TACHYCARDIA 90 tablet 1  .  ergocalciferol (VITAMIN D2) 1.25 MG (50000 UT) capsule Take 1 capsule (50,000 Units total) by mouth once a week. 12 capsule 0   No current facility-administered medications for this visit.    Psychiatric Specialty Exam: Review of Systems  All other systems reviewed and are negative.   There were no vitals taken for this visit.There is no height or weight on file to calculate BMI.  General Appearance: Casual and Well Groomed  Eye Contact:  Good  Speech:  Clear and Coherent and Normal Rate  Volume:  Normal  Mood:  Euthymic  Affect:  Full Range  Thought Process:  Goal Directed and Linear  Orientation:  Full (Time, Place, and Person)  Thought Content: Logical   Suicidal Thoughts:  No  Homicidal Thoughts:  No  Memory:  Immediate;   Good Recent;   Good Remote;   Good  Judgement:  Good  Insight:  Good  Psychomotor Activity:  Normal  Concentration:  Concentration: Fair  Recall:  Good  Fund of Knowledge: Good  Language: Good  Akathisia:  Negative  Handed:  Right  AIMS (if indicated): not done  Assets:  Communication Skills Desire for Improvement Financial Resources/Insurance Housing Vocational/Educational  ADL's:  Intact  Cognition: WNL  Sleep:  Good   Screenings: PHQ2-9     Office Visit from 04/16/2020 in [facility]   PHQ-2 Total Score 0  PHQ-9  Total Score 5       Assessment and Plan: 44 yo divorced white female with a long-standing problems with falling asleep and waking up few hours after she falls asleep. She hehas beenprescribed alprazolam 1 mg which seems to work fairly well: she took1 mg at bedtime but then neededanother 1/2 of the pill after 3-4 hours because she wakes up. She tended to worry excessively and when she goes to bed she would ruminate about things that happened this day and what "wrong": may happen tomorrow. In the past she tended to drink alcohol before going to bed to calm self down but realized this is becoming a problem and stopped a year ago. She did not experience any withdrawal symptoms. Sparrow does not reports any panic attacks during the day. In the past she tried zolpidem but felt as though she was hallucinating on that medication. Asuka has been taking citalopram since 2002. It was originally prescribed for anxiety and some depression associated with a stress of going through international adoption. She and her husband at the time were adopting two children: son from [place] and daughter from [place]. She does not report any depressive symptoms anymore and she tried to come off citalopram but each time she tried she would feel dizzy and continue to take it. Nayana also has been diagnosed with ADD and was taking Adderall and Vyvanse as an adult. While these medications helped with focusing she became more anxious and eventually stopped taking stimulants in last year. 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 She denies having any hx of mania, psychosis, drug abuse.We have changed regular alprazolam to alprazolam SR 2 mg at night and she no longer is waking up in the middle of the night. She feels more rested in AM. It takes longer for her to fall asleep but now that she realizes it she times taking her medication accordingly. She has decided to continue citalopram  unchanged. She denies having any anxiety or depression.  Dx: GAD; Insomnia; ADD by hx  Plan: We  willcontinuealprazolamSR2 mgat HS and citalopram unchanged. Next appointment infourmonths.The plan was discussed with patient who had an opportunity to ask questions and these were all answered. I spend58minutes in videoconferencingwith the patient.    Stephanie Acre, MD 05/28/2020, 3:42 PM

## 2020-06-02 ENCOUNTER — Other Ambulatory Visit (HOSPITAL_COMMUNITY): Payer: Self-pay | Admitting: Psychiatry

## 2020-06-03 ENCOUNTER — Telehealth (HOSPITAL_COMMUNITY): Payer: Self-pay | Admitting: *Deleted

## 2020-06-03 NOTE — Telephone Encounter (Signed)
Opened in error

## 2020-07-02 ENCOUNTER — Other Ambulatory Visit: Payer: Self-pay | Admitting: Internal Medicine

## 2020-08-29 ENCOUNTER — Other Ambulatory Visit (HOSPITAL_COMMUNITY): Payer: Self-pay | Admitting: Psychiatry

## 2020-09-24 ENCOUNTER — Telehealth (INDEPENDENT_AMBULATORY_CARE_PROVIDER_SITE_OTHER): Payer: BC Managed Care – PPO | Admitting: Psychiatry

## 2020-09-24 ENCOUNTER — Encounter (HOSPITAL_COMMUNITY): Payer: Self-pay | Admitting: Psychiatry

## 2020-09-24 ENCOUNTER — Other Ambulatory Visit: Payer: Self-pay

## 2020-09-24 DIAGNOSIS — F4321 Adjustment disorder with depressed mood: Secondary | ICD-10-CM | POA: Diagnosis not present

## 2020-09-24 DIAGNOSIS — F5101 Primary insomnia: Secondary | ICD-10-CM | POA: Insufficient documentation

## 2020-09-24 DIAGNOSIS — F411 Generalized anxiety disorder: Secondary | ICD-10-CM

## 2020-09-24 MED ORDER — CITALOPRAM HYDROBROMIDE 40 MG PO TABS: 40.0000 mg | ORAL_TABLET | Freq: Every day | ORAL | 1 refills | Status: DC

## 2020-09-24 MED ORDER — ALPRAZOLAM ER 2 MG PO TB24: ORAL_TABLET | ORAL | 2 refills | Status: DC

## 2020-09-24 NOTE — Progress Notes (Signed)
Early MD/PA/NP OP Progress Note  09/24/2020 3:45 PM Katelyn Hobbs  MRN:  [id number] Interview was conducted using videoconferencing application and I verified that I was speaking with the correct person using two identifiers. I discussed the limitations of evaluation and management by telemedicine and  the availability of in person appointments. Patient expressed understanding and agreed to proceed. Participants in the visit: patient (location - home); physician (location - home office).  Chief Complaint: None.  HPI: 44 yo divorced white female with a long-standing problems with falling asleep and waking up few hours after she falls asleep. She hehas beenprescribed alprazolam 1 mg which seems to work fairly well: she took1 mg at bedtime but then neededanother 1/2 of the pill after 3-4 hours because she wakes up. She tendedto worry excessively and when she goes to bed she wouldruminate about things that happened this day and what "wrong": may happen tomorrow. In the past she tended to drink alcohol before going to bed to calm self down but realized this is becoming a problem and stopped a year ago. She did not experience any withdrawal symptoms. Drucella does not reports any panic attacks during the day. In the past she tried zolpidem but felt as though she was hallucinating on that medication. Khaleelah has been taking citalopram since 2002. It was originally prescribed for anxiety and some depression associated with a stress of going through international adoption. She and her husband at the time were adopting two children: son from [place] and daughter from [place]. She does not report any depressive symptoms anymore and she tried to come off citalopram but each time she tried she would feel dizzy and continue to take it. Carleta also has been diagnosed with ADD and was taking Adderall and Vyvanse as an adult. While these medications helped with focusing she became more anxious and eventually stopped  taking stimulants in last year. 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 She denies having any hx of mania, psychosis, drug abuse.We have changed regular alprazolam to alprazolam SR 2 mg at night and she no longer is waking up in the middle of the night. She feels more rested in AM. It takes longer for her to fall asleep but now that she realizes it she times taking her medication accordingly. She denies having any anxiety or depression.    Visit Diagnosis:    ICD-10-CM   1. Primary insomnia  F51.01   2. Situational depression  F43.21 citalopram (CELEXA) 40 MG tablet  3. GAD (generalized anxiety disorder)  F41.1     Past Psychiatric History: Please see intake H&P.  Past Medical History:  Past Medical History:  Diagnosis Date  . Abnormal uterine bleeding   . ADHD (attention deficit hyperactivity disorder)   . Anemia   . Anxiety   . Bacterial vaginosis   . Depression   . Endometriosis   . Hyperlipidemia   . Migraine   . PCOS (polycystic ovarian syndrome)     Past Surgical History:  Procedure Laterality Date  . LAPAROSCOPY  1993  . SALPINGECTOMY Bilateral 2015  . VAGINAL HYSTERECTOMY  2017    Family Psychiatric History: Reviuewed.  Family History:  Family History  Problem Relation Age of Onset  . Cancer Maternal Grandmother        breast  . Heart disease Maternal Grandmother   . Breast cancer Maternal Grandmother   . Heart disease Paternal Grandmother   . Cancer Paternal  Grandfather        colon  . Diabetes Paternal Grandfather   . Bipolar disorder Maternal Grandfather   . Diabetes Father   . Hyperlipidemia Father   . Hypertension Father   . Cancer Mother 22       uterine   . Cancer Sister   . ADD / ADHD Sister   . Diabetes Sister   . Hypertension Sister     Social History:  Social History   Socioeconomic History  . Marital status: Divorced    Spouse name: Not on file  . Number of children: 2  . Years  of education: Not on file  . Highest education level: Not on file  Occupational History  . Occupation: Therapist, sports  Tobacco Use  . Smoking status: Former Research scientist (life sciences)  . Smokeless tobacco: Never Used  Vaping Use  . Vaping Use: Never used  Substance and Sexual Activity  . Alcohol use: Yes    Alcohol/week: 0.0 standard drinks    Comment: occas  . Drug use: No  . Sexual activity: Yes    Birth control/protection: Surgical  Other Topics Concern  . Not on file  Social History Narrative   2 adopted children: 44 yo son, 29 yo daughter,    Social Determinants of Radio broadcast assistant Strain:   . Difficulty of Paying Living Expenses: Not on file  Food Insecurity:   . Worried About Charity fundraiser in the Last Year: Not on file  . Ran Out of Food in the Last Year: Not on file  Transportation Needs:   . Lack of Transportation (Medical): Not on file  . Lack of Transportation (Non-Medical): Not on file  Physical Activity:   . Days of Exercise per Week: Not on file  . Minutes of Exercise per Session: Not on file  Stress:   . Feeling of Stress : Not on file  Social Connections:   . Frequency of Communication with Friends and Family: Not on file  . Frequency of Social Gatherings with Friends and Family: Not on file  . Attends Religious Services: Not on file  . Active Member of Clubs or Organizations: Not on file  . Attends Archivist Meetings: Not on file  . Marital Status: Not on file    Allergies: No Known Allergies  Metabolic Disorder Labs: Lab Results  Component Value Date   HGBA1C 5.1 05/17/2018   No results found for: PROLACTIN Lab Results  Component Value Date   CHOL 248 (H) 05/17/2018   TRIG 325 (H) 05/17/2018   HDL 62 05/17/2018   CHOLHDL 4.0 05/17/2018   LDLCALC 121 (H) 05/17/2018   LDLCALC 136 (H) 12/15/2016   Lab Results  Component Value Date   TSH 2.33 04/16/2020   TSH 1.970 05/17/2018    Therapeutic Level Labs: No results found for: LITHIUM No  results found for: VALPROATE No components found for:  CBMZ  Current Medications: Current Outpatient Medications  Medication Sig Dispense Refill  . [START ON 11/28/2020] ALPRAZolam (XANAX XR) 2 MG 24 hr tablet TAKE 1 TABLET BY MOUTH EVERYDAY AT BEDTIME 30 tablet 2  . cetirizine (ZYRTEC) 10 MG tablet Take 10 mg by mouth daily.    . cholecalciferol (VITAMIN D3) 25 MCG (1000 UNIT) tablet Take 1,000 Units by mouth daily.    Derrill Memo ON 11/23/2020] citalopram (CELEXA) 40 MG tablet Take 1 tablet (40 mg total) by mouth daily. 90 tablet 1  . cyanocobalamin (,VITAMIN B-12,) 1000 MCG/ML injection INJECT  1 ML (1,000 MCG TOTAL) INTO THE MUSCLE EVERY 30 (THIRTY) DAYS. (Patient not taking: Reported on 04/16/2020) 3 mL 6  . diltiazem (CARDIZEM) 30 MG tablet TAKE 1 TABLET BY MOUTH AS NEEDED FOR TACHYCARDIA 90 tablet 1  . ergocalciferol (VITAMIN D2) 1.25 MG (50000 UT) capsule Take 1 capsule (50,000 Units total) by mouth once a week. 12 capsule 0   No current facility-administered medications for this visit.    Psychiatric Specialty Exam: Review of Systems  All other systems reviewed and are negative.   There were no vitals taken for this visit.There is no height or weight on file to calculate BMI.  General Appearance: Casual and Well Groomed  Eye Contact:  Good  Speech:  Clear and Coherent and Normal Rate  Volume:  Normal  Mood:  Euthymic  Affect:  Full Range  Thought Process:  Goal Directed and Linear  Orientation:  Full (Time, Place, and Person)  Thought Content: Logical   Suicidal Thoughts:  No  Homicidal Thoughts:  No  Memory:  Immediate;   Good  Judgement:  Good  Insight:  Good  Psychomotor Activity:  Normal  Concentration:  Concentration: Good  Recall:  Good  Fund of Knowledge: Good  Language: Good  Akathisia:  Negative  Handed:  Right  AIMS (if indicated): not done  Assets:  Communication Skills Desire for Improvement Financial Resources/Insurance Housing Social  Support Vocational/Educational  ADL's:  Intact  Cognition: WNL  Sleep:  Good   Screenings: PHQ2-9     Office Visit from 04/16/2020 in [facility]  PHQ-2 Total Score 0  PHQ-9 Total Score 5       Assessment and Plan: 44 yo divorced white female with a long-standing problems with falling asleep and waking up few hours after she falls asleep. She hehas beenprescribed alprazolam 1 mg which seems to work fairly well: she took1 mg at bedtime but then neededanother 1/2 of the pill after 3-4 hours because she wakes up. She tendedto worry excessively and when she goes to bed she wouldruminate about things that happened this day and what "wrong": may happen tomorrow. In the past she tended to drink alcohol before going to bed to calm self down but realized this is becoming a problem and stopped a year ago. She did not experience any withdrawal symptoms. Kamee does not reports any panic attacks during the day. In the past she tried zolpidem but felt as though she was hallucinating on that medication. Dessire has been taking citalopram since 2002. It was originally prescribed for anxiety and some depression associated with a stress of going through international adoption. She and her husband at the time were adopting two children: son from [place] and daughter from [place]. She does not report any depressive symptoms anymore and she tried to come off citalopram but each time she tried she would feel dizzy and continue to take it. Malene also has been diagnosed with ADD and was taking Adderall and Vyvanse as an adult. While these medications helped with focusing she became more anxious and eventually stopped taking stimulants in last year. 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 She denies having any hx of mania, psychosis, drug abuse.We have changed regular alprazolam to alprazolam SR 2 mg at night and she no longer is waking up in the  middle of the night. She feels more rested in AM. It takes longer for her to fall asleep but now  that she realizes it she times taking her medication accordingly. She denies having any anxiety or depression.  Dx: Insomnia; GAD;  ADD by hx  Plan: We willcontinuealprazolamSR2 mgat HS and citalopram unchanged. Next appointment infourmonths.The plan was discussed with patient who had an opportunity to ask questions and these were all answered. I spend76minutes in videoconferencingwith the patient.   Stephanie Acre, MD 09/24/2020, 3:45 PM

## 2020-10-01 ENCOUNTER — Telehealth (HOSPITAL_COMMUNITY): Payer: BC Managed Care – PPO | Admitting: Psychiatry

## 2020-10-28 ENCOUNTER — Telehealth (INDEPENDENT_AMBULATORY_CARE_PROVIDER_SITE_OTHER): Payer: BC Managed Care – PPO | Admitting: Family Medicine

## 2020-10-28 DIAGNOSIS — R059 Cough, unspecified: Secondary | ICD-10-CM

## 2020-10-28 DIAGNOSIS — R0981 Nasal congestion: Secondary | ICD-10-CM

## 2020-10-28 MED ORDER — BENZONATATE 100 MG PO CAPS
100.0000 mg | ORAL_CAPSULE | Freq: Three times a day (TID) | ORAL | 0 refills | Status: DC | PRN
Start: 2020-10-28 — End: 2020-12-11

## 2020-10-28 NOTE — Patient Instructions (Signed)
  HOME CARE TIPS:  -[place] COVID19 testing information: [email or web address] OR [phone] Most pharmacies also offer testing and home test kits.  -I sent the medication(s) we discussed to your pharmacy: Meds ordered this encounter  Medications  . benzonatate (TESSALON PERLES) 100 MG capsule    Sig: Take 1 capsule (100 mg total) by mouth 3 (three) times daily as needed.    Dispense:  20 capsule    Refill:  0     -can use tylenol or aleve if needed for fevers, aches and pains per instructions  -can use nasal saline a few times per day if nasal congestion, sometime a short course of Afrin nasal spray for 3 days can help as well  -stay hydrated, drink plenty of fluids and eat small healthy meals - avoid dairy  -can take 1000 IU Vit D3 and Vit C lozenges per instructions  -If the Covid test is positive, check out the CDC website for more information on home care, transmission and treatment for COVID19  -follow up with your doctor in 2-3 days unless improving and feeling better  -stay home while sick, except to seek medical care, and if you have COVID19 please stay home for a full 10 days since the onset of symptoms PLUS one day of no fever and feeling better.  It was nice to meet you today, and I really hope you are feeling better soon. I help [facility] out with telemedicine visits on Tuesdays and Thursdays and am available for visits on those days. If you have any concerns or questions following this visit please schedule a follow up visit with your Primary Care doctor or seek care at a local urgent care clinic to avoid delays in care.    Seek in person care promptly if your symptoms worsen, new concerns arise or you are not improving with treatment. Call 911 and/or seek emergency care if you symptoms are severe or life threatening.

## 2020-10-28 NOTE — Progress Notes (Signed)
Virtual Visit via Telephone Note  I connected with Katelyn Hobbs on 10/28/20 at  1:40 PM EST by telephone and verified that I am speaking with the correct person using two identifiers.   I discussed the limitations, risks, security and privacy concerns of performing an evaluation and management service by telephone and the availability of in person appointments. I also discussed with the patient that there may be a patient responsible charge related to this service. The patient expressed understanding and agreed to proceed.  Location patient: home, [place] Location provider: work or home office Participants present for the call: patient, provider Patient did not have a visit with me in the prior 7 days to address this/these issue(s).   History of Present Illness:  Acute telemedicine visit for Katelyn Hobbs: -Onset: 10/28/20 -Symptoms include: nasal congestion, sinus headache, a little achy, mild cough -office mate tested positive for covid last week and was out - masks at all times -Denies: fever, cp, sob, nvd -Has tried:nothing -Pertinent past medical history: denies any risk factor for severe disease -Pertinent medication allergies:nkda -COVID-19 vaccine status: fully vaccinated and had booster   Observations/Objective: Patient sounds cheerful and well on the phone. I do not appreciate any SOB. Speech and thought processing are grossly intact. Patient reported vitals:  Assessment and Plan:  Nasal congestion  Cough  -we discussed possible serious and likely etiologies, options for evaluation and workup, limitations of telemedicine visit vs in person visit, treatment, treatment risks and precautions. Pt prefers to treat via telemedicine empirically rather than in person at this moment. She is scheduled for a covid test in the morning. Opted for Tessalon for cough, symptomatic care with nasal saline, humidifier other options summarized in patient instructions.  Discussed options, potential  complications precautions of Covid. Work/School slipped offered:  declined Advised to seek prompt in person care if worsening, new symptoms arise, or if is not improving with treatment. Advised of options for inperson care in case PCP office not available. Did let the patient know that I only do telemedicine shifts for [place] on Tuesdays and Thursdays and advised a follow up visit with PCP or at an [facility] if has further questions or concerns.   Follow Up Instructions:  I did not refer this patient for an OV with me in the next 24 hours for this/these issue(s).  I discussed the assessment and treatment plan with the patient. The patient was provided an opportunity to ask questions and all were answered. The patient agreed with the plan and demonstrated an understanding of the instructions.   I spent 14 minutes on the date of this visit in the care of this patient. See summary of tasks completed to properly care for this patient in the detailed notes above which often include previsit review of recent office visit notes, review of PMH, medications, allergies, evaluation of the patient and ordering and instructing patient on testing and care options.     Terressa Koyanagi, DO

## 2020-10-30 ENCOUNTER — Other Ambulatory Visit: Payer: BC Managed Care – PPO

## 2020-12-11 ENCOUNTER — Telehealth (INDEPENDENT_AMBULATORY_CARE_PROVIDER_SITE_OTHER): Payer: BC Managed Care – PPO | Admitting: Internal Medicine

## 2020-12-11 ENCOUNTER — Encounter: Payer: Self-pay | Admitting: Internal Medicine

## 2020-12-11 VITALS — Ht 66.0 in | Wt 162.0 lb

## 2020-12-11 DIAGNOSIS — H6692 Otitis media, unspecified, left ear: Secondary | ICD-10-CM

## 2020-12-11 DIAGNOSIS — B373 Candidiasis of vulva and vagina: Secondary | ICD-10-CM | POA: Diagnosis not present

## 2020-12-11 DIAGNOSIS — B3731 Acute candidiasis of vulva and vagina: Secondary | ICD-10-CM

## 2020-12-11 DIAGNOSIS — H9202 Otalgia, left ear: Secondary | ICD-10-CM | POA: Diagnosis not present

## 2020-12-11 MED ORDER — AMOXICILLIN-POT CLAVULANATE 875-125 MG PO TABS: 1.0000 | ORAL_TABLET | Freq: Two times a day (BID) | ORAL | 0 refills | Status: DC

## 2020-12-11 MED ORDER — CIPROFLOXACIN-DEXAMETHASONE 0.3-0.1 % OT SUSP: 4.0000 [drp] | Freq: Two times a day (BID) | OTIC | 0 refills | Status: DC

## 2020-12-11 MED ORDER — FLUCONAZOLE 150 MG PO TABS: 150.0000 mg | ORAL_TABLET | Freq: Once | ORAL | 0 refills | Status: AC

## 2020-12-11 NOTE — Progress Notes (Signed)
Virtual Visit via Video Note  I connected with Katelyn Hobbs  on 12/11/20 at  7:40 AM EST by a video enabled telemedicine application and verified that I am speaking with the correct person using two identifiers.  Location patient: car Location provider:work or home office Persons participating in the virtual visit: patient, provider  I discussed the limitations of evaluation and management by telemedicine and the availability of in person appointments. The patient expressed understanding and agreed to proceed.   HPI: Left ear pain and drainage tried [place] oint per dermatology. Pain x 1 month but worse 2/12 when outside for face and worse 12/08/20 ear was dry and crusty so this is why dermatology though it was seb derm or PSA. No reported trauma. Sunday she reports a h/a and took 2 covid home tests and were negative and 12/09/20 left ear pain was worse. She has h/o chronic ear infections but not seen ENT in years    ROS: See pertinent positives and negatives per HPI.  Past Medical History:  Diagnosis Date  . Abnormal uterine bleeding   . ADHD (attention deficit hyperactivity disorder)   . Anemia   . Anxiety   . Bacterial vaginosis   . Depression   . Endometriosis   . Hyperlipidemia   . Migraine   . PCOS (polycystic ovarian syndrome)     Past Surgical History:  Procedure Laterality Date  . LAPAROSCOPY  1993  . SALPINGECTOMY Bilateral 2015  . VAGINAL HYSTERECTOMY  2017     Current Outpatient Medications:  .  ALPRAZolam (XANAX XR) 2 MG 24 hr tablet, TAKE 1 TABLET BY MOUTH EVERYDAY AT BEDTIME, Disp: 30 tablet, Rfl: 2 .  amoxicillin-clavulanate (AUGMENTIN) 875-125 MG tablet, Take 1 tablet by mouth 2 (two) times daily. With food, Disp: 14 tablet, Rfl: 0 .  cetirizine (ZYRTEC) 10 MG tablet, Take 10 mg by mouth daily., Disp: , Rfl:  .  cholecalciferol (VITAMIN D3) 25 MCG (1000 UNIT) tablet, Take 1,000 Units by mouth daily., Disp: , Rfl:  .  ciprofloxacin-dexamethasone (CIPRODEX)  OTIC suspension, Place 4 drops into the left ear 2 (two) times daily. X 7-10 days, Disp: 7.5 mL, Rfl: 0 .  citalopram (CELEXA) 40 MG tablet, Take 1 tablet (40 mg total) by mouth daily., Disp: 90 tablet, Rfl: 1 .  fluconazole (DIFLUCAN) 150 MG tablet, Take 1 tablet (150 mg total) by mouth once for 1 dose., Disp: 1 tablet, Rfl: 0 .  triamcinolone ointment (KENALOG) 0.1 %, SMARTSIG:1 Topical Every Night, Disp: , Rfl:  .  cyanocobalamin (,VITAMIN B-12,) 1000 MCG/ML injection, INJECT 1 ML (1,000 MCG TOTAL) INTO THE MUSCLE EVERY 30 (THIRTY) DAYS. (Patient not taking: No sig reported), Disp: 3 mL, Rfl: 6 .  diltiazem (CARDIZEM) 30 MG tablet, TAKE 1 TABLET BY MOUTH AS NEEDED FOR TACHYCARDIA (Patient not taking: Reported on 12/11/2020), Disp: 90 tablet, Rfl: 1  EXAM:  VITALS per patient if applicable:  GENERAL: alert, oriented, appears well and in no acute distress  PSYCH/NEURO: pleasant and cooperative, no obvious depression or anxiety, speech and thought processing grossly intact  ASSESSMENT AND PLAN:  Discussed the following assessment and plan:  Left ear pain - Plan: ciprofloxacin-dexamethasone (CIPRODEX) OTIC suspension, amoxicillin-clavulanate (AUGMENTIN) 875-125 MG tablet  Left otitis media, unspecified otitis media type - Plan: ciprofloxacin-dexamethasone (CIPRODEX) OTIC suspension, amoxicillin-clavulanate (AUGMENTIN) 875-125 MG tablet  Yeast vaginitis - Plan: fluconazole (DIFLUCAN) 150 MG tablet  If not better consider formal covid 19 test and ENT referral urgently my chart back   -we  discussed possible serious and likely etiologies, options for evaluation and workup, limitations of telemedicine visit vs in person visit, treatment, treatment risks and precautions.  I discussed the assessment and treatment plan with the patient. The patient was provided an opportunity to ask questions and all were answered. The patient agreed with the plan and demonstrated an understanding of the  instructions.    Time spent 20 min Delorise Jackson, MD

## 2020-12-11 NOTE — Progress Notes (Signed)
Onset of a month, first thing in the morning there is drainage from the ear and pain. This past Sunday the pain became more noticeable.  Was given a steroid cream to put in the ear but the pain is ongoing.  This is all in the left ear.  Patient has a history of ear infections.

## 2020-12-24 ENCOUNTER — Telehealth (INDEPENDENT_AMBULATORY_CARE_PROVIDER_SITE_OTHER): Payer: BC Managed Care – PPO | Admitting: Psychiatry

## 2020-12-24 ENCOUNTER — Other Ambulatory Visit: Payer: Self-pay

## 2020-12-24 DIAGNOSIS — F5101 Primary insomnia: Secondary | ICD-10-CM

## 2020-12-24 DIAGNOSIS — F411 Generalized anxiety disorder: Secondary | ICD-10-CM | POA: Diagnosis not present
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
## 2021-01-03 ENCOUNTER — Other Ambulatory Visit: Payer: Self-pay

## 2021-01-07 ENCOUNTER — Encounter: Payer: Self-pay | Admitting: Internal Medicine

## 2021-01-07 ENCOUNTER — Ambulatory Visit (INDEPENDENT_AMBULATORY_CARE_PROVIDER_SITE_OTHER): Payer: BC Managed Care – PPO | Admitting: Internal Medicine

## 2021-01-07 ENCOUNTER — Other Ambulatory Visit: Payer: Self-pay

## 2021-01-07 ENCOUNTER — Other Ambulatory Visit (HOSPITAL_COMMUNITY)
Admission: RE | Admit: 2021-01-07 | Discharge: 2021-01-07 | Disposition: A | Discharge status: Home / Self Care | Payer: BC Managed Care – PPO | Source: Ambulatory Visit | Attending: Internal Medicine | Admitting: Internal Medicine

## 2021-01-07 VITALS — BP 92/66 | HR 89 | Temp 99.3°F | Resp 14 | Ht 66.0 in | Wt 173.0 lb

## 2021-01-07 DIAGNOSIS — E669 Obesity, unspecified: Secondary | ICD-10-CM | POA: Diagnosis not present

## 2021-01-07 DIAGNOSIS — Z0000 Encounter for general adult medical examination without abnormal findings: Secondary | ICD-10-CM | POA: Diagnosis not present

## 2021-01-07 DIAGNOSIS — I471 Supraventricular tachycardia: Secondary | ICD-10-CM

## 2021-01-07 DIAGNOSIS — Z124 Encounter for screening for malignant neoplasm of cervix: Secondary | ICD-10-CM

## 2021-01-07 DIAGNOSIS — R7309 Other abnormal glucose: Secondary | ICD-10-CM | POA: Diagnosis not present

## 2021-01-07 DIAGNOSIS — N761 Subacute and chronic vaginitis: Secondary | ICD-10-CM | POA: Diagnosis not present

## 2021-01-07 DIAGNOSIS — E559 Vitamin D deficiency, unspecified: Secondary | ICD-10-CM

## 2021-01-07 LAB — LIPID PANEL W/REFLEX DIRECT LDL
Cholesterol: 206 mg/dL — ABNORMAL HIGH (ref <200)
HDL: 44 mg/dL — ABNORMAL LOW (ref >=50)
LDL Cholesterol (Calc): 130 mg/dL (calc) — ABNORMAL HIGH
Non-HDL Cholesterol (Calc): 162 mg/dL (calc) — ABNORMAL HIGH (ref <130)
Total CHOL/HDL Ratio: 4.7 (calc) (ref <5.0)
Triglycerides: 185 mg/dL — ABNORMAL HIGH (ref <150)

## 2021-01-07 NOTE — Patient Instructions (Signed)
Health Maintenance, Female Adopting a healthy lifestyle and getting preventive care are important in promoting health and wellness. Ask your health care provider about:  The right schedule for you to have regular tests and exams.  Things you can do on your own to prevent diseases and keep yourself healthy. What should I know about diet, weight, and exercise? Eat a healthy diet  Eat a diet that includes plenty of vegetables, fruits, low-fat dairy products, and lean protein.  Do not eat a lot of foods that are high in solid fats, added sugars, or sodium.   Maintain a healthy weight Body mass index (BMI) is used to identify weight problems. It estimates body fat based on height and weight. Your health care provider can help determine your BMI and help you achieve or maintain a healthy weight. Get regular exercise Get regular exercise. This is one of the most important things you can do for your health. Most adults should:  Exercise for at least 150 minutes each week. The exercise should increase your heart rate and make you sweat (moderate-intensity exercise).  Do strengthening exercises at least twice a week. This is in addition to the moderate-intensity exercise.  Spend less time sitting. Even light physical activity can be beneficial. Watch cholesterol and blood lipids Have your blood tested for lipids and cholesterol at 46 years of age, then have this test every 5 years. Have your cholesterol levels checked more often if:  Your lipid or cholesterol levels are high.  You are older than 46 years of age.  You are at high risk for heart disease. What should I know about cancer screening? Depending on your health history and family history, you may need to have cancer screening at various ages. This may include screening for:  Breast cancer.  Cervical cancer.  Colorectal cancer.  Skin cancer.  Lung cancer. What should I know about heart disease, diabetes, and high blood  pressure? Blood pressure and heart disease  High blood pressure causes heart disease and increases the risk of stroke. This is more likely to develop in people who have high blood pressure readings, are of African descent, or are overweight.  Have your blood pressure checked: ? Every 3-5 years if you are 18-39 years of age. ? Every year if you are 40 years old or older. Diabetes Have regular diabetes screenings. This checks your fasting blood sugar level. Have the screening done:  Once every three years after age 40 if you are at a normal weight and have a low risk for diabetes.  More often and at a younger age if you are overweight or have a high risk for diabetes. What should I know about preventing infection? Hepatitis B If you have a higher risk for hepatitis B, you should be screened for this virus. Talk with your health care provider to find out if you are at risk for hepatitis B infection. Hepatitis C Testing is recommended for:  Everyone born from 1945 through 1965.  Anyone with known risk factors for hepatitis C. Sexually transmitted infections (STIs)  Get screened for STIs, including gonorrhea and chlamydia, if: ? You are sexually active and are younger than 46 years of age. ? You are older than 46 years of age and your health care provider tells you that you are at risk for this type of infection. ? Your sexual activity has changed since you were last screened, and you are at increased risk for chlamydia or gonorrhea. Ask your health care provider   if you are at risk.  Ask your health care provider about whether you are at high risk for HIV. Your health care provider may recommend a prescription medicine to help prevent HIV infection. If you choose to take medicine to prevent HIV, you should first get tested for HIV. You should then be tested every 3 months for as long as you are taking the medicine. Pregnancy  If you are about to stop having your period (premenopausal) and  you may become pregnant, seek counseling before you get pregnant.  Take 400 to 800 micrograms (mcg) of folic acid every day if you become pregnant.  Ask for birth control (contraception) if you want to prevent pregnancy. Osteoporosis and menopause Osteoporosis is a disease in which the bones lose minerals and strength with aging. This can result in bone fractures. If you are 65 years old or older, or if you are at risk for osteoporosis and fractures, ask your health care provider if you should:  Be screened for bone loss.  Take a calcium or vitamin D supplement to lower your risk of fractures.  Be given hormone replacement therapy (HRT) to treat symptoms of menopause. Follow these instructions at home: Lifestyle  Do not use any products that contain nicotine or tobacco, such as cigarettes, e-cigarettes, and chewing tobacco. If you need help quitting, ask your health care provider.  Do not use street drugs.  Do not share needles.  Ask your health care provider for help if you need support or information about quitting drugs. Alcohol use  Do not drink alcohol if: ? Your health care provider tells you not to drink. ? You are pregnant, may be pregnant, or are planning to become pregnant.  If you drink alcohol: ? Limit how much you use to 0-1 drink a day. ? Limit intake if you are breastfeeding.  Be aware of how much alcohol is in your drink. In the [place] one drink equals one 12 oz bottle of beer (355 mL), one 5 oz glass of wine (148 mL), or one 1 oz glass of hard liquor (44 mL). General instructions  Schedule regular health, dental, and eye exams.  Stay current with your vaccines.  Tell your health care provider if: ? You often feel depressed. ? You have ever been abused or do not feel safe at home. Summary  Adopting a healthy lifestyle and getting preventive care are important in promoting health and wellness.  Follow your health care provider's instructions about healthy  diet, exercising, and getting tested or screened for diseases.  Follow your health care provider's instructions on monitoring your cholesterol and blood pressure. This information is not intended to replace advice given to you by your health care provider. Make sure you discuss any questions you have with your health care provider. Document Revised: 10/05/2018 Document Reviewed: 10/05/2018 Elsevier Patient Education  2021 [employer]  

## 2021-01-07 NOTE — Progress Notes (Signed)
Patient ID: Katelyn Hobbs, female    DOB: February 20, 1976  Age: 45 y.o. MRN: [id number]  The patient is here for annual preventive  examination and management of other chronic and acute problems.   The risk factors are reflected in the social history.  The roster of all physicians providing medical care to patient - is listed in the Snapshot section of the chart.  Activities of daily living:  The patient is 100% independent in all ADLs: dressing, toileting, feeding as well as independent mobility  Home safety : The patient has smoke detectors in the home. They wear seatbelts.  There are no firearms at home. There is no violence in the home.   There is no risks for hepatitis, STDs or HIV. There is no   history of blood transfusion. They have no travel history to infectious disease endemic areas of the world.  The patient has seen their dentist in the last six month. They have seen their eye doctor in the last year. She denies  hearing difficulty  And does not   have excessive sun exposure. Discussed the need for sun protection: hats, long sleeves and use of sunscreen if there is significant sun exposure.   Diet: the importance of a healthy diet is discussed. They do have a healthy diet.  The benefits of regular aerobic exercise were discussed. She walks 4 times per week ,  20 minutes.   Depression screen: there are no signs or vegative symptoms of depression- irritability, change in appetite, anhedonia, sadness/tearfullness.   The following portions of the patient's history were reviewed and updated as appropriate: allergies, current medications, past family history, past medical history,  past surgical history, past social history  and problem list.  Visual acuity was not assessed per patient preference since she has regular follow up with her ophthalmologist. Hearing and body mass index were assessed and reviewed.   During the course of the visit the patient was educated and counseled about  appropriate screening and preventive services including : fall prevention , diabetes screening, nutrition counseling, colorectal cancer screening, and recommended immunizations.    CC: The primary encounter diagnosis was Encounter for preventive health examination. Diagnoses of Vitamin D deficiency, Elevated glucose level, Obesity (BMI 30.0-34.9), Subacute vaginitis, Cervical cancer screening, and Paroxysmal SVT (supraventricular tachycardia) (HCC) were also pertinent to this visit.  1) palpitations , chronic ,  Cardiology referral recommended at first visit  But not  ordered. She has not had to use the cardizem .     History [name] has a past medical history of Abnormal uterine bleeding, ADHD (attention deficit hyperactivity disorder), Anemia, Anxiety, Bacterial vaginosis, Depression, Endometriosis, Hyperlipidemia, Migraine, and PCOS (polycystic ovarian syndrome).   She has a past surgical history that includes Salpingectomy (Bilateral, 2015); laparoscopy (1993); and Vaginal hysterectomy (2017).   Her family history includes ADD / ADHD in her sister; Bipolar disorder in her maternal grandfather; Breast cancer in her maternal grandmother; Cancer in her maternal grandmother, paternal grandfather, and sister; Cancer (age of onset: 17) in her mother; Diabetes in her father, paternal grandfather, and sister; Heart disease in her maternal grandmother and paternal grandmother; Hyperlipidemia in her father; Hypertension in her father and sister.She reports that she has quit smoking. She has never used smokeless tobacco. She reports current alcohol use. She reports that she does not use drugs.  Outpatient Medications Prior to Visit  Medication Sig Dispense Refill  . ALPRAZolam (XANAX XR) 2 MG 24 hr tablet TAKE 1 TABLET BY MOUTH  EVERYDAY AT BEDTIME 30 tablet 2  . cetirizine (ZYRTEC) 10 MG tablet Take 10 mg by mouth daily.    . citalopram (CELEXA) 40 MG tablet Take 1 tablet (40 mg total) by mouth daily. 90  tablet 1  . cholecalciferol (VITAMIN D3) 25 MCG (1000 UNIT) tablet Take 1,000 Units by mouth daily. (Patient not taking: Reported on 01/07/2021)    . diltiazem (CARDIZEM) 30 MG tablet TAKE 1 TABLET BY MOUTH AS NEEDED FOR TACHYCARDIA (Patient not taking: No sig reported) 90 tablet 1  . amoxicillin-clavulanate (AUGMENTIN) 875-125 MG tablet Take 1 tablet by mouth 2 (two) times daily. With food 14 tablet 0  . ciprofloxacin-dexamethasone (CIPRODEX) OTIC suspension Place 4 drops into the left ear 2 (two) times daily. X 7-10 days 7.5 mL 0  . cyanocobalamin (,VITAMIN B-12,) 1000 MCG/ML injection INJECT 1 ML (1,000 MCG TOTAL) INTO THE MUSCLE EVERY 30 (THIRTY) DAYS. (Patient not taking: No sig reported) 3 mL 6  . triamcinolone ointment (KENALOG) 0.1 % SMARTSIG:1 Topical Every Night     No facility-administered medications prior to visit.    Review of Systems   Patient denies headache, fevers, malaise, unintentional weight loss, skin rash, eye pain, sinus congestion and sinus pain, sore throat, dysphagia,  hemoptysis , cough, dyspnea, wheezing, chest pain, palpitations, orthopnea, edema, abdominal pain, nausea, melena, diarrhea, constipation, flank pain, dysuria, hematuria, urinary  Frequency, nocturia, numbness, tingling, seizures,  Focal weakness, Loss of consciousness,  Tremor, insomnia, depression, anxiety, and suicidal ideation.     Objective:  BP 92/66 (BP Location: Left Arm, Patient Position: Sitting, Cuff Size: Normal)   Pulse 89   Temp 99.3 F (37.4 C) (Oral)   Resp 14   Ht 5\' 6"  (1.676 m)   Wt 173 lb (78.5 kg)   SpO2 99%   BMI 27.92 kg/m   Physical Exam  General Appearance:    Alert, cooperative, no distress, appears stated age  Head:    Normocephalic, without obvious abnormality, atraumatic  Eyes:    PERRL, conjunctiva/corneas clear, EOM's intact, fundi    benign, both eyes  Ears:    Normal TM's and external ear canals, both ears  Nose:   Nares normal, septum midline, mucosa normal,  no drainage    or sinus tenderness  Throat:   Lips, mucosa, and tongue normal; teeth and gums normal  Neck:   Supple, symmetrical, trachea midline, no adenopathy;    thyroid:  no enlargement/tenderness/nodules; no carotid   bruit or JVD  Back:     Symmetric, no curvature, ROM normal, no CVA tenderness  Lungs:     Clear to auscultation bilaterally, respirations unlabored  Chest Wall:    No tenderness or deformity   Heart:    Regular rate and rhythm, S1 and S2 normal, no murmur, rub   or gallop  Breast Exam:    No tenderness, masses, or nipple abnormality  Abdomen:     Soft, non-tender, bowel sounds active all four quadrants,    no masses, no organomegaly  Genitalia:    Pelvic: cervix normal in appearance, external genitalia normal, no adnexal masses or tenderness, no cervical motion tenderness, rectovaginal septum normal, uterus normal size, shape, and consistency and vagina normal without discharge  Extremities:   Extremities normal, atraumatic, no cyanosis or edema  Pulses:   2+ and symmetric all extremities  Skin:   Skin color, texture, turgor normal, no rashes or lesions  Lymph nodes:   Cervical, supraclavicular, and axillary nodes normal  Neurologic:   CNII-XII  intact, normal strength, sensation and reflexes    throughout      Assessment & Plan:   Problem List Items Addressed This Visit      Unprioritized   Elevated glucose level   Relevant Orders   Comprehensive metabolic panel (Completed)   Encounter for preventive health examination - Primary    age appropriate education and counseling updated, referrals for preventative services and immunizations addressed, dietary and smoking counseling addressed, most recent labs reviewed.  I have personally reviewed and have noted:  1) the patient's medical and social history 2) The pt's use of alcohol, tobacco, and illicit drugs 3) The patient's current medications and supplements 4) Functional ability including ADL's, fall risk,  home safety risk, hearing and visual impairment 5) Diet and physical activities 6) Evidence for depression or mood disorder 7) The patient's height, weight, and BMI have been recorded in the chart  I have made referrals, and provided counseling and education based on review of the above      Obesity (BMI 30.0-34.9)    She is unable to exercise currently due to persistent knee pain .  Reviewed diet and recommended water aerobics        Relevant Orders   Lipid Panel w/reflex Direct LDL (Completed)   Paroxysmal SVT (supraventricular tachycardia) (HCC)    She has not had an episodes requiring use of cardizem.       Vitamin D deficiency   Relevant Orders   VITAMIN D 25 Hydroxy (Vit-D Deficiency, Fractures) (Completed)    Other Visit Diagnoses    Subacute vaginitis       Relevant Orders   Cytology - PAP( [facility])   Cervical cancer screening       Relevant Orders   Cytology - PAP( [facility])      I have discontinued Thayer Headings C. Bodenheimer's cyanocobalamin, triamcinolone ointment, ciprofloxacin-dexamethasone, and amoxicillin-clavulanate. I am also having her maintain her cholecalciferol, cetirizine, diltiazem, ALPRAZolam, and citalopram.  No orders of the defined types were placed in this encounter.   Medications Discontinued During This Encounter  Medication Reason  . amoxicillin-clavulanate (AUGMENTIN) 875-125 MG tablet   . ciprofloxacin-dexamethasone (CIPRODEX) OTIC suspension   . triamcinolone ointment (KENALOG) 0.1 %   . cyanocobalamin (,VITAMIN B-12,) 1000 MCG/ML injection     Follow-up: No follow-ups on file.   Crecencio Mc, MD

## 2021-01-08 DIAGNOSIS — Z1211 Encounter for screening for malignant neoplasm of colon: Secondary | ICD-10-CM

## 2021-01-08 DIAGNOSIS — Z0000 Encounter for general adult medical examination without abnormal findings: Secondary | ICD-10-CM | POA: Insufficient documentation

## 2021-01-08 LAB — COMPREHENSIVE METABOLIC PANEL
ALT: 10 U/L (ref 0–35)
AST: 12 U/L (ref 0–37)
Albumin: 4.3 g/dL (ref 3.5–5.2)
Alkaline Phosphatase: 56 U/L (ref 39–117)
BUN: 8 mg/dL (ref 6–23)
CO2: 27 mEq/L (ref 19–32)
Calcium: 9.4 mg/dL (ref 8.4–10.5)
Chloride: 103 mEq/L (ref 96–112)
Creatinine, Ser: 0.7 mg/dL (ref 0.40–1.20)
GFR: 103.89 mL/min (ref >60.00)
Glucose, Bld: 92 mg/dL (ref 70–99)
Potassium: 3.5 mEq/L (ref 3.5–5.1)
Sodium: 138 mEq/L (ref 135–145)
Total Bilirubin: 0.3 mg/dL (ref 0.2–1.2)
Total Protein: 7.2 g/dL (ref 6.0–8.3)

## 2021-01-08 LAB — VITAMIN D 25 HYDROXY (VIT D DEFICIENCY, FRACTURES): VITD: 20.09 ng/mL — ABNORMAL LOW (ref 30.00–100.00)

## 2021-01-08 NOTE — Assessment & Plan Note (Signed)

## 2021-01-08 NOTE — Assessment & Plan Note (Signed)
She has not had an episodes requiring use of cardizem.

## 2021-01-08 NOTE — Assessment & Plan Note (Signed)
She is unable to exercise currently due to persistent knee pain .  Reviewed diet and recommended water aerobics

## 2021-01-11 ENCOUNTER — Other Ambulatory Visit: Payer: Self-pay | Admitting: Internal Medicine

## 2021-01-11 DIAGNOSIS — E559 Vitamin D deficiency, unspecified: Secondary | ICD-10-CM

## 2021-01-11 MED ORDER — ERGOCALCIFEROL 1.25 MG (50000 UT) PO CAPS: 50000.0000 [IU] | ORAL_CAPSULE | ORAL | 0 refills | Status: DC

## 2021-01-11 NOTE — Assessment & Plan Note (Signed)
Your vitamin D is low.   I am calling in a megadose of Vit D to take once weekly for a total of 3 months,  Then after you finish the weekly supplement, she can  Resume taking an OTC  Vit D3 supplement 1000 units daily.

## 2021-01-13 LAB — CYTOLOGY - PAP
Chlamydia: NEGATIVE
Comment: NEGATIVE
Comment: NEGATIVE
Comment: NEGATIVE
Comment: NORMAL
Diagnosis: NEGATIVE
HSV1: NEGATIVE
HSV2: NEGATIVE
Neisseria Gonorrhea: NEGATIVE
Trichomonas: NEGATIVE

## 2021-01-15 NOTE — Progress Notes (Signed)
  I am happy to inform you that your PAP smear was normal,  And your  HPV screen was negative.  We will repeat your cervical cancer screening in 3 years .  Regards,   Deborra Medina, MD

## 2021-01-27 ENCOUNTER — Other Ambulatory Visit: Payer: Self-pay | Admitting: Internal Medicine

## 2021-01-27 DIAGNOSIS — Z1231 Encounter for screening mammogram for malignant neoplasm of breast: Secondary | ICD-10-CM

## 2021-02-18 ENCOUNTER — Telehealth (INDEPENDENT_AMBULATORY_CARE_PROVIDER_SITE_OTHER): Payer: BC Managed Care – PPO | Admitting: Psychiatry

## 2021-02-18 ENCOUNTER — Encounter (HOSPITAL_COMMUNITY): Payer: Self-pay | Admitting: Psychiatry

## 2021-02-18 ENCOUNTER — Other Ambulatory Visit: Payer: Self-pay

## 2021-02-18 DIAGNOSIS — F411 Generalized anxiety disorder: Secondary | ICD-10-CM

## 2021-02-18 DIAGNOSIS — F4321 Adjustment disorder with depressed mood: Secondary | ICD-10-CM

## 2021-02-18 MED ORDER — ALPRAZOLAM ER 2 MG PO TB24: ORAL_TABLET | ORAL | 2 refills | Status: DC

## 2021-02-18 MED ORDER — CITALOPRAM HYDROBROMIDE 40 MG PO TABS: 40.0000 mg | ORAL_TABLET | Freq: Every day | ORAL | 1 refills | Status: AC

## 2021-02-18 NOTE — Progress Notes (Signed)
[facility] MD/PA/NP OP Progress Note  02/18/2021 2:02 PM Katelyn Hobbs  MRN:  [id number] Interview was conducted by phone due to patient's difficulty with establishing video connection and I verified that I was speaking with the correct person using two identifiers. I discussed the limitations of evaluation and management by telemedicine and  the availability of in person appointments. Patient expressed understanding and agreed to proceed. Participants in the visit: patient (location - home); physician (location - home office).  Chief Complaint: bedtime anxiety  HPI: Patient is a 45 yo divorced white femalewith along-standing problems with falling asleep and waking up few hours after she falls asleep and anxiety. She reports she has had sleep related anxiety at night and when she wake sup. She was seeing Dr.Pucilowska before who is no longer with [facility]. She is doing much better on the SR alprazolam and the citalopram.  She has 2 children 14 and 77 yo who are adopted. She has been divorced for 7 years. States her children are grappling with their own mental health issues which do weigh on her. Denies any suicidal thoughts.   Visit Diagnosis:    ICD-10-CM   1. GAD (generalized anxiety disorder)  F41.1     Past Psychiatric History: Please see intake H&P.  Past Medical History:  Past Medical History:  Diagnosis Date  . Abnormal uterine bleeding   . ADHD (attention deficit hyperactivity disorder)   . Anemia   . Anxiety   . Bacterial vaginosis   . Depression   . Endometriosis   . Hyperlipidemia   . Migraine   . PCOS (polycystic ovarian syndrome)     Past Surgical History:  Procedure Laterality Date  . LAPAROSCOPY  1993  . SALPINGECTOMY Bilateral 2015  . VAGINAL HYSTERECTOMY  2017    Family Psychiatric History: Reviewed.  Family History:  Family History  Problem Relation Age of Onset  . Cancer Maternal Grandmother        breast  . Heart disease Maternal Grandmother   .  Breast cancer Maternal Grandmother   . Heart disease Paternal Grandmother   . Cancer Paternal Grandfather        colon  . Diabetes Paternal Grandfather   . Bipolar disorder Maternal Grandfather   . Diabetes Father   . Hyperlipidemia Father   . Hypertension Father   . Cancer Mother 75       uterine   . Cancer Sister   . ADD / ADHD Sister   . Diabetes Sister   . Hypertension Sister     Social History:  Social History   Socioeconomic History  . Marital status: Divorced    Spouse name: Not on file  . Number of children: 2  . Years of education: Not on file  . Highest education level: Not on file  Occupational History  . Occupation: Therapist, sports  Tobacco Use  . Smoking status: Former Research scientist (life sciences)  . Smokeless tobacco: Never Used  Vaping Use  . Vaping Use: Never used  Substance and Sexual Activity  . Alcohol use: Yes    Alcohol/week: 0.0 standard drinks    Comment: occas  . Drug use: No  . Sexual activity: Yes    Birth control/protection: Surgical  Other Topics Concern  . Not on file  Social History Narrative   2 adopted children: 25 yo son, 55 yo daughter,    Social Determinants of Radio broadcast assistant Strain: Not on file  Food Insecurity: Not on file  Transportation Needs: Not  on file  Physical Activity: Not on file  Stress: Not on file  Social Connections: Not on file    Allergies: No Known Allergies  Metabolic Disorder Labs: Lab Results  Component Value Date   HGBA1C 5.1 05/17/2018   No results found for: PROLACTIN Lab Results  Component Value Date   CHOL 206 (H) 01/07/2021   TRIG 185 (H) 01/07/2021   HDL 44 (L) 01/07/2021   CHOLHDL 4.7 01/07/2021   LDLCALC 130 (H) 01/07/2021   LDLCALC 121 (H) 05/17/2018   Lab Results  Component Value Date   TSH 2.33 04/16/2020   TSH 1.970 05/17/2018    Therapeutic Level Labs: No results found for: LITHIUM No results found for: VALPROATE No components found for:  CBMZ  Current Medications: Current Outpatient  Medications  Medication Sig Dispense Refill  . ALPRAZolam (XANAX XR) 2 MG 24 hr tablet TAKE 1 TABLET BY MOUTH EVERYDAY AT BEDTIME 30 tablet 2  . cetirizine (ZYRTEC) 10 MG tablet Take 10 mg by mouth daily.    . cholecalciferol (VITAMIN D3) 25 MCG (1000 UNIT) tablet Take 1,000 Units by mouth daily. (Patient not taking: Reported on 01/07/2021)    . citalopram (CELEXA) 40 MG tablet Take 1 tablet (40 mg total) by mouth daily. 90 tablet 1  . diltiazem (CARDIZEM) 30 MG tablet TAKE 1 TABLET BY MOUTH AS NEEDED FOR TACHYCARDIA (Patient not taking: No sig reported) 90 tablet 1  . ergocalciferol (DRISDOL) 1.25 MG (50000 UT) capsule Take 1 capsule (50,000 Units total) by mouth once a week. 12 capsule 0   No current facility-administered medications for this visit.     Psychiatric Specialty Exam: Review of Systems  Psychiatric/Behavioral: The patient is nervous/anxious.   All other systems reviewed and are negative.   There were no vitals taken for this visit.There is no height or weight on file to calculate BMI.  General Appearance: NA  Eye Contact:  NA  Speech:  Clear and Coherent and Normal Rate  Volume:  Normal  Mood:  Anxiety around sleep  Affect:  NA  Thought Process:  Goal Directed and Linear  Orientation:  Full (Time, Place, and Person)  Thought Content: Logical   Suicidal Thoughts:  No  Homicidal Thoughts:  No  Memory:  Immediate;   Good Recent;   Good Remote;   Good  Judgement:  Good  Insight:  Good  Psychomotor Activity:  NA  Concentration:  Concentration: Fair  Recall:  Good  Fund of Knowledge: Good  Language: Good  Akathisia:  Negative  Handed:  Right  AIMS (if indicated): not done  Assets:  Communication Skills Desire for Improvement Financial Resources/Insurance Housing Vocational/Educational  ADL's:  Intact  Cognition: WNL  Sleep:  Fair   Screenings: PHQ2-9   [place] Office Visit from 01/07/2021 in [facility] Office Visit from 04/16/2020  in [facility] [facility]  PHQ-2 Total Score 0 0  PHQ-9 Total Score 0 5       Assessment and Plan: 45 yo divorced white femalewith along-standing problems with falling asleep and waking up few hours after she falls asleep and anxiety.  Dx: GAD; Insomnia;  ADD by hx  Plan: We willcontinuealprazolamSR2 mgat HS and citalopram unchanged. Next appointment in2 months.The plan was discussed with patient who had an opportunity to ask questions and these were all answered. I spend45minutes in phone consultationwith the patient. I spent 20 minutes in chart review.     Elvin So, MD 02/18/2021, 2:02 PM

## 2021-03-04 ENCOUNTER — Ambulatory Visit: Payer: BC Managed Care – PPO | Admitting: Gastroenterology

## 2021-03-11 ENCOUNTER — Ambulatory Visit
Admission: RE | Admit: 2021-03-11 | Discharge: 2021-03-11 | Disposition: A | Discharge status: Home / Self Care | Payer: BC Managed Care – PPO | Source: Ambulatory Visit

## 2021-03-11 ENCOUNTER — Other Ambulatory Visit: Payer: Self-pay

## 2021-03-11 DIAGNOSIS — Z1231 Encounter for screening mammogram for malignant neoplasm of breast: Secondary | ICD-10-CM | POA: Diagnosis not present

## 2021-03-11 IMAGING — MG MM DIGITAL SCREENING BILAT W/ TOMO AND CAD
6 of 12 series · 6 of 36 positions shown · non-contrast
Comparison: Previous exams.

CLINICAL DATA: Screening.

EXAM:
DIGITAL SCREENING BILATERAL MAMMOGRAM WITH TOMOSYNTHESIS AND CAD
TECHNIQUE: Bilateral screening digital craniocaudal and mediolateral oblique
mammograms were obtained. Bilateral screening digital breast
tomosynthesis was performed. The images were evaluated with
computer-aided detection.

[L CV synth-2D]
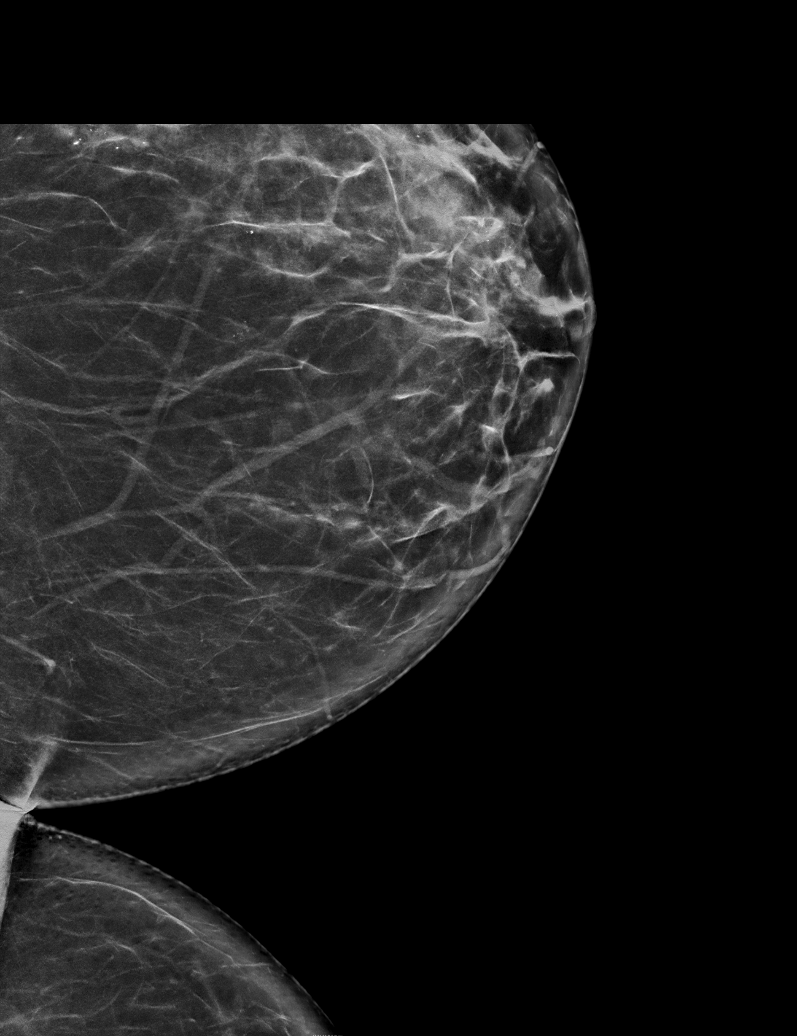

[L MLO synth-2D (1 of 2)]
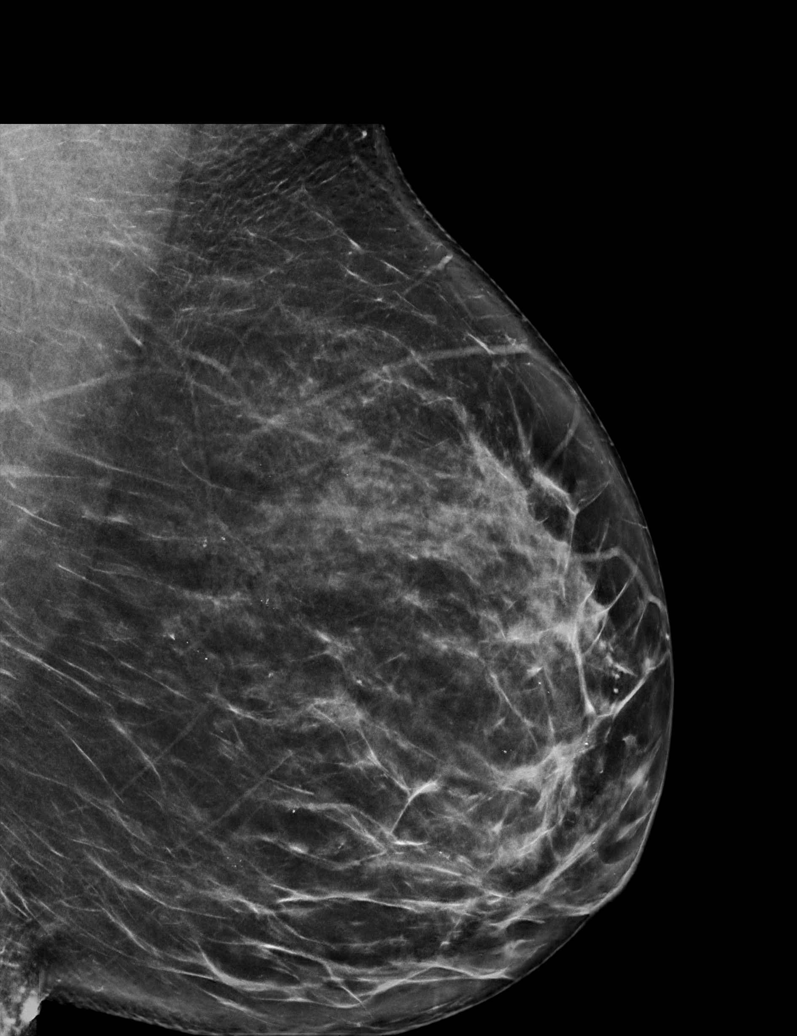

[R MLO synth-2D]
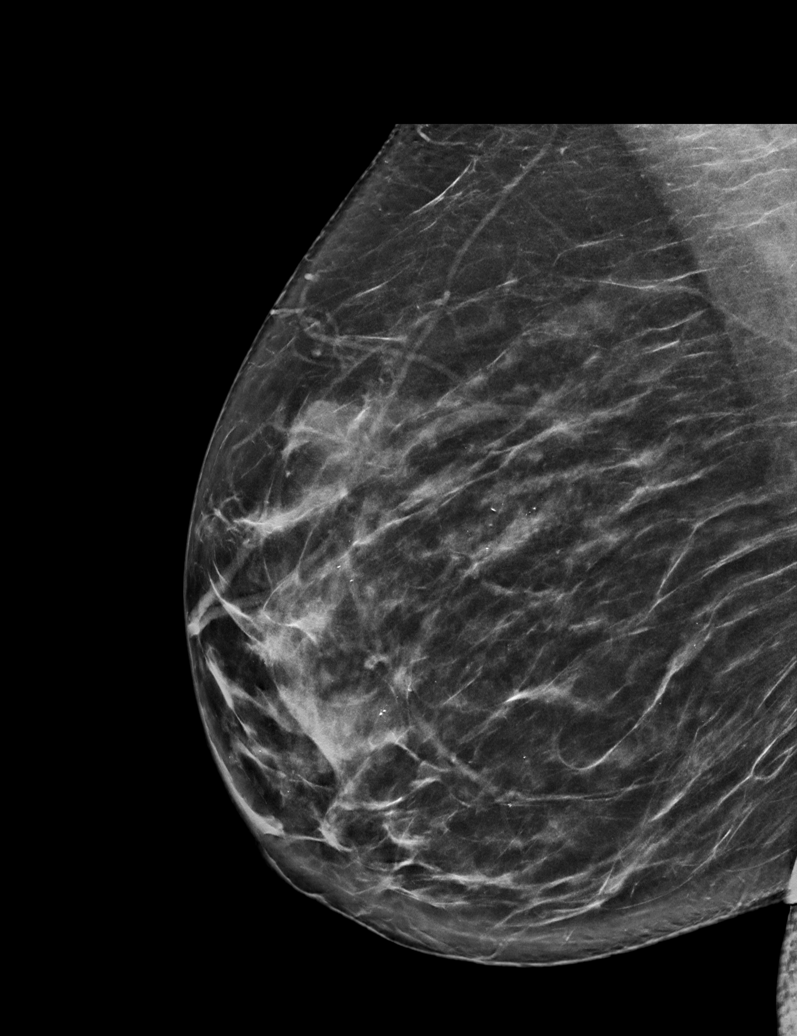

[R CC synth-2D]
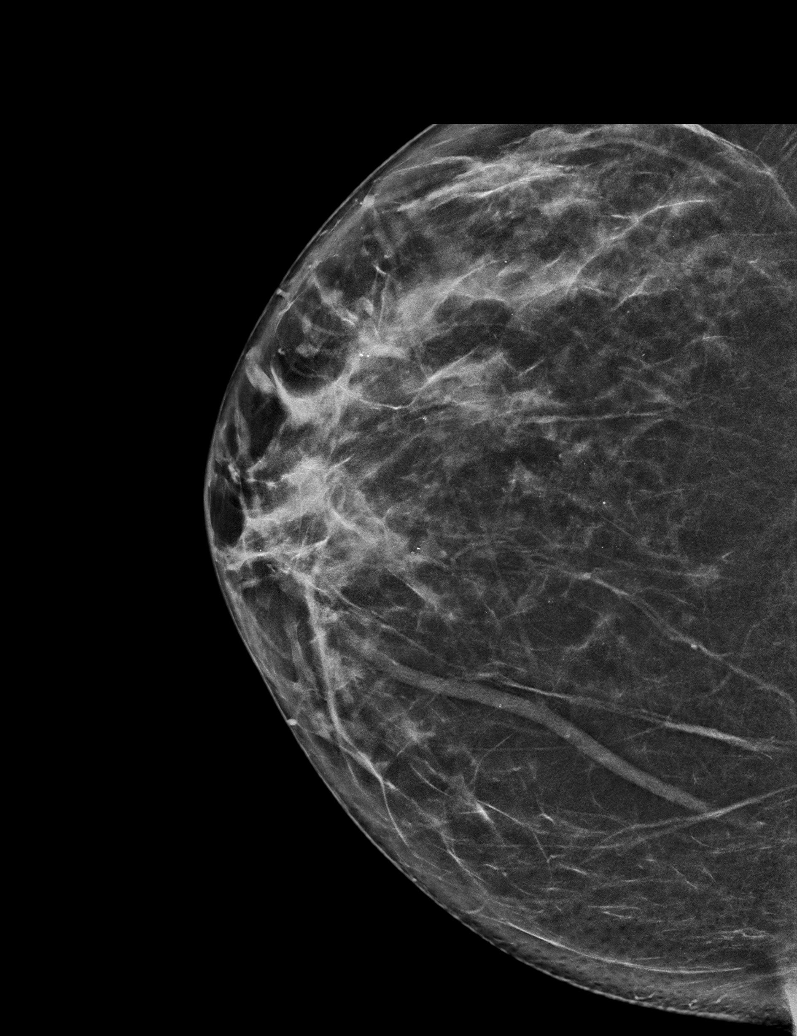

[L MLO synth-2D (2 of 2)]
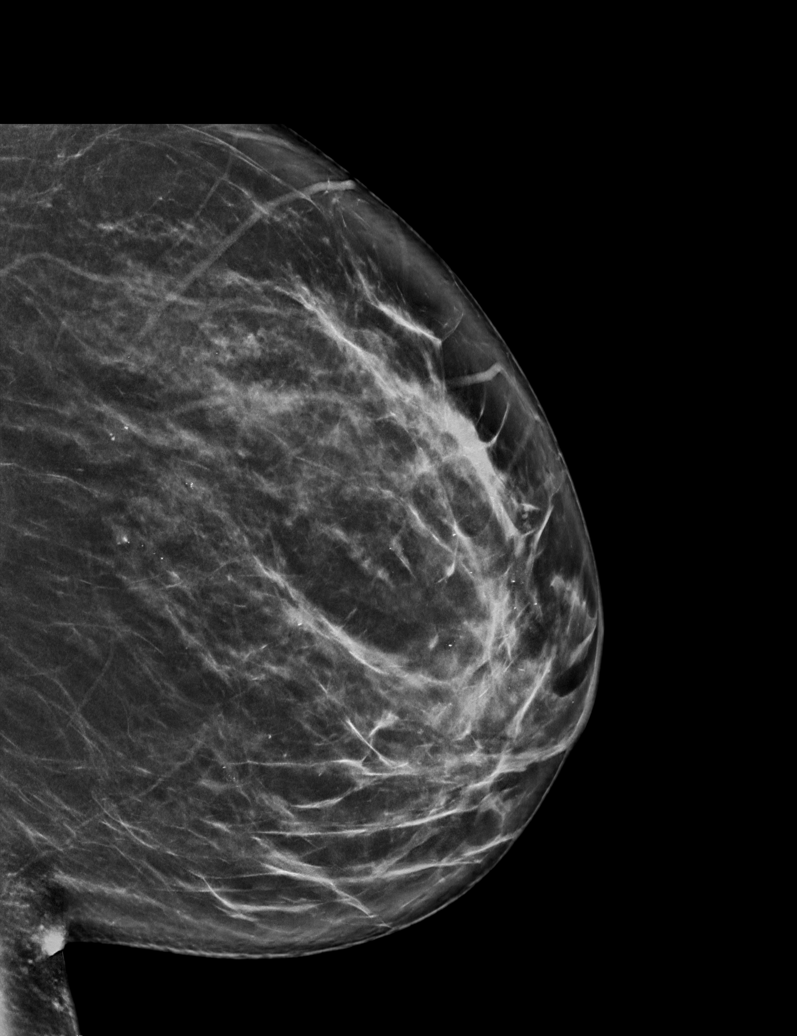

[L CC synth-2D]
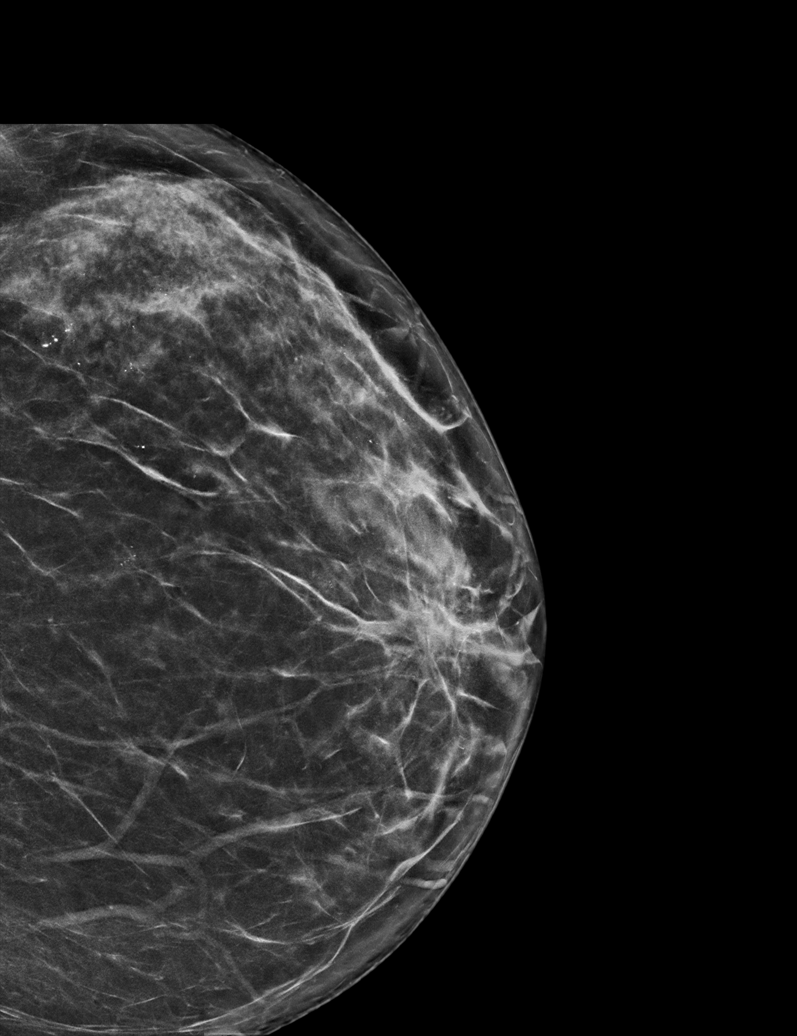

[6 of 36 positions shown; findings below may reference images not displayed]

ACR Breast Density Category c: The breast tissue is heterogeneously
dense, which may obscure small masses.
FINDINGS: In the left breast, calcifications warrant further evaluation. In
the right breast, no findings suspicious for malignancy.
IMPRESSION: Further evaluation is suggested for calcifications in the left
breast.

RECOMMENDATION:
Diagnostic mammogram of the left breast. (Code:[OI])

The patient will be contacted regarding the findings, and additional
imaging will be scheduled.

BI-RADS CATEGORY  0: Incomplete. Need additional imaging evaluation
and/or prior mammograms for comparison.

## 2021-03-12 ENCOUNTER — Ambulatory Visit: Payer: BC Managed Care – PPO | Admitting: Internal Medicine

## 2021-03-12 ENCOUNTER — Other Ambulatory Visit: Payer: Self-pay | Admitting: Internal Medicine

## 2021-03-12 DIAGNOSIS — R928 Other abnormal and inconclusive findings on diagnostic imaging of breast: Secondary | ICD-10-CM

## 2021-03-16 ENCOUNTER — Other Ambulatory Visit: Payer: Self-pay | Admitting: Internal Medicine

## 2021-03-16 DIAGNOSIS — R921 Mammographic calcification found on diagnostic imaging of breast: Secondary | ICD-10-CM | POA: Insufficient documentation

## 2021-03-26 ENCOUNTER — Telehealth (HOSPITAL_COMMUNITY): Payer: Self-pay | Admitting: *Deleted

## 2021-03-26 NOTE — Telephone Encounter (Signed)
Placed call to patient to inform of cancelled appoint.  Discussed need for patient to secure another provider and gave information regarding the resource letter that has been mailed.  Patient will not need refills of  medications.  Patient has secured another provider.

## 2021-03-31 ENCOUNTER — Other Ambulatory Visit: Payer: Self-pay

## 2021-03-31 ENCOUNTER — Ambulatory Visit
Admission: RE | Admit: 2021-03-31 | Discharge: 2021-03-31 | Disposition: A | Discharge status: Home / Self Care | Payer: BC Managed Care – PPO | Source: Ambulatory Visit | Attending: Internal Medicine | Admitting: Internal Medicine

## 2021-03-31 ENCOUNTER — Other Ambulatory Visit: Payer: Self-pay | Admitting: Internal Medicine

## 2021-03-31 DIAGNOSIS — R928 Other abnormal and inconclusive findings on diagnostic imaging of breast: Secondary | ICD-10-CM

## 2021-03-31 DIAGNOSIS — Z803 Family history of malignant neoplasm of breast: Secondary | ICD-10-CM | POA: Diagnosis not present

## 2021-03-31 DIAGNOSIS — R921 Mammographic calcification found on diagnostic imaging of breast: Secondary | ICD-10-CM | POA: Diagnosis not present

## 2021-03-31 DIAGNOSIS — R922 Inconclusive mammogram: Secondary | ICD-10-CM | POA: Diagnosis not present

## 2021-03-31 IMAGING — MG DIGITAL DIAGNOSTIC UNILAT LEFT W/ CAD
4 series · 4 of 4 positions shown · non-contrast
Comparison: Previous exam(s).

CLINICAL DATA: Recall from screening mammography, calcifications
involving the UPPER OUTER QUADRANT of the LEFT breast at POSTERIOR
depth.

Family history of breast cancer in her maternal grandmother who died
at age 42.
EXAM:
DIGITAL DIAGNOSTIC UNILATERAL LEFT MAMMOGRAM WITH CAD
TECHNIQUE: Left digital diagnostic mammography was performed. Mammographic
images were processed with CAD.

[L CC]
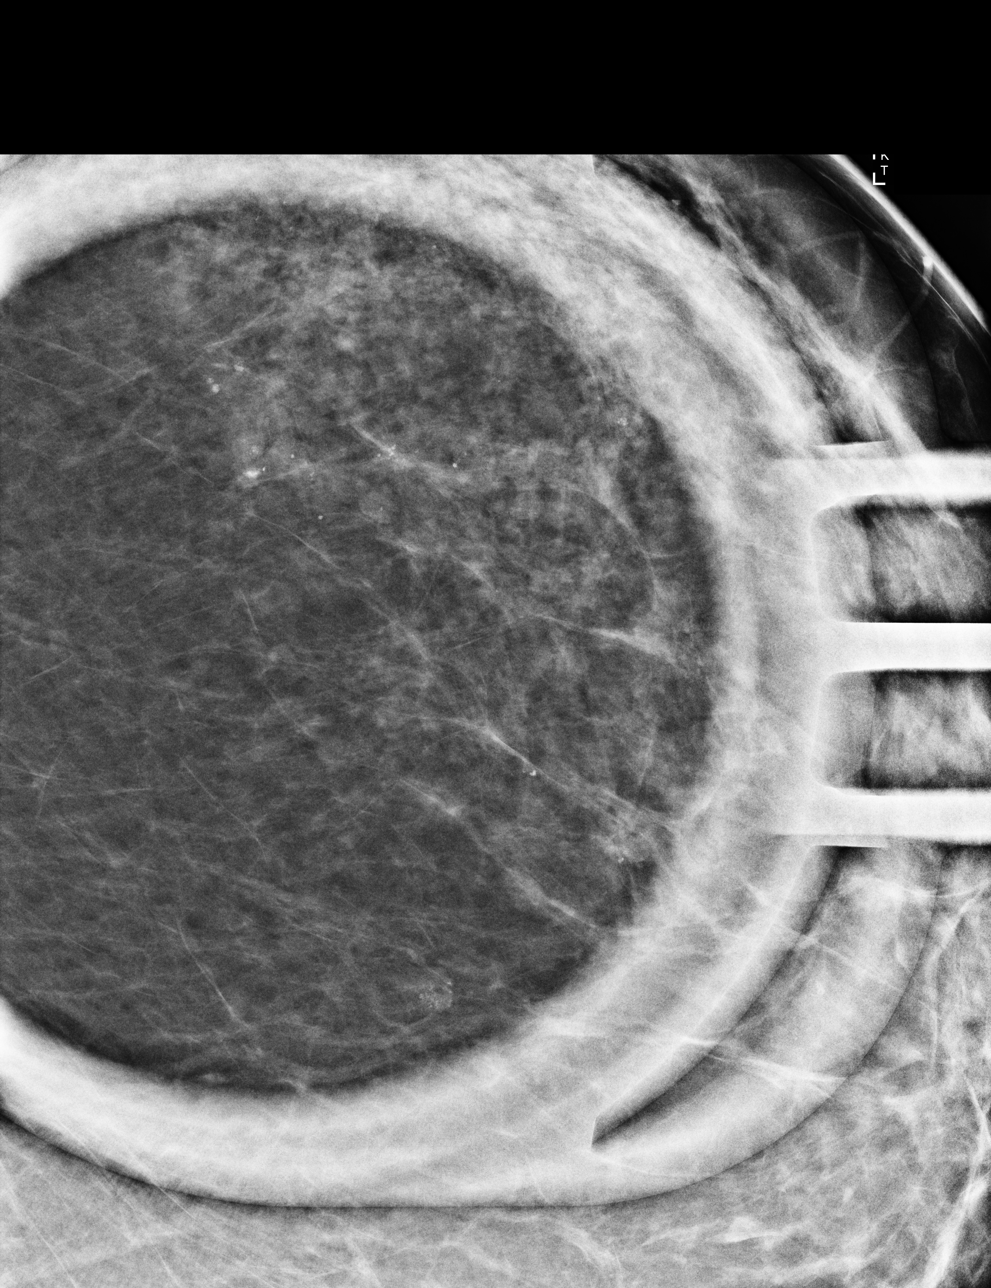

[L ML (1 of 3)]
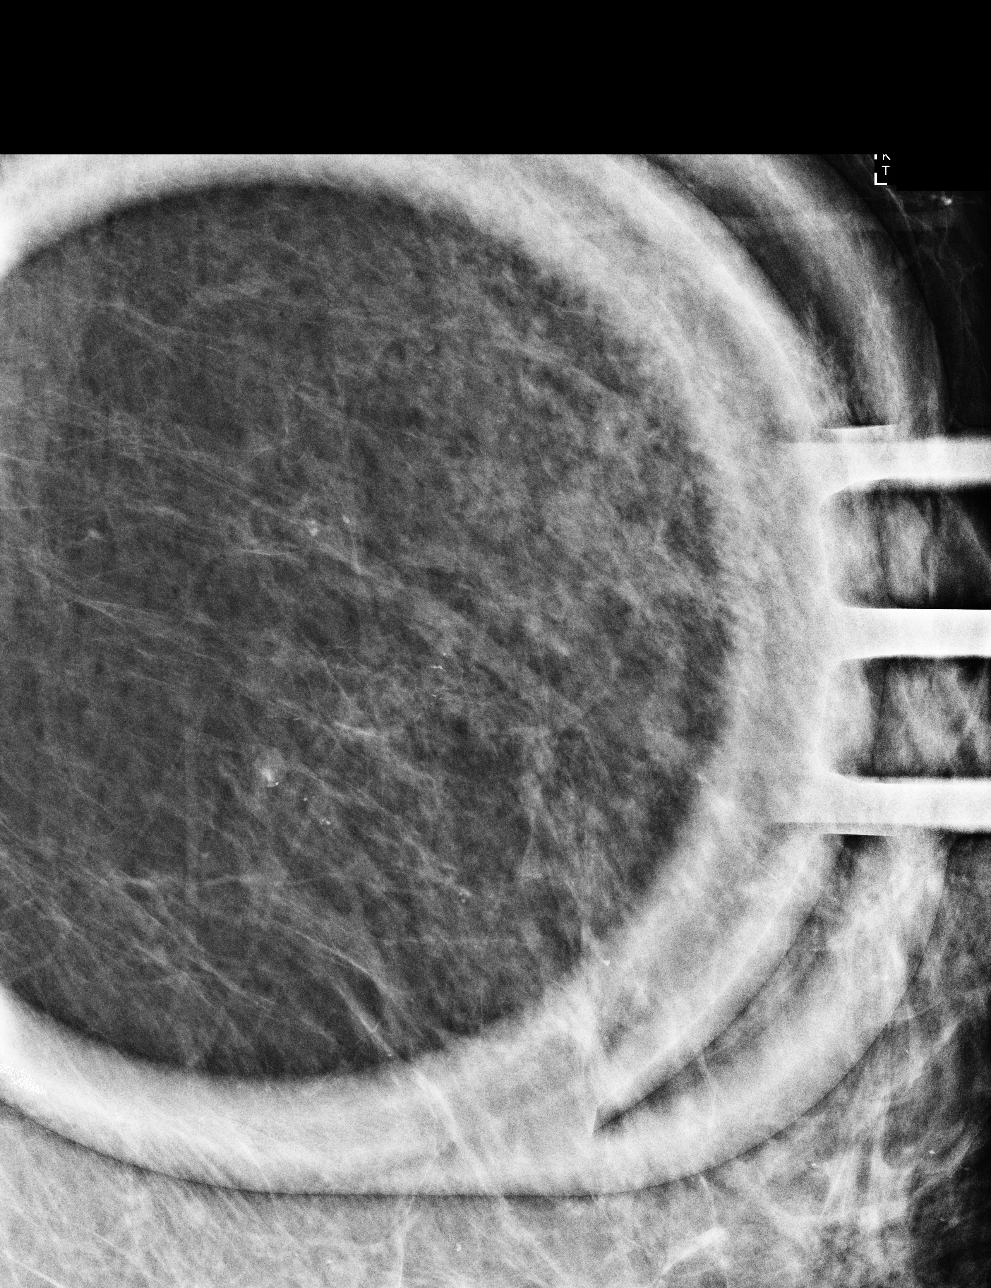

[L ML (2 of 3)]
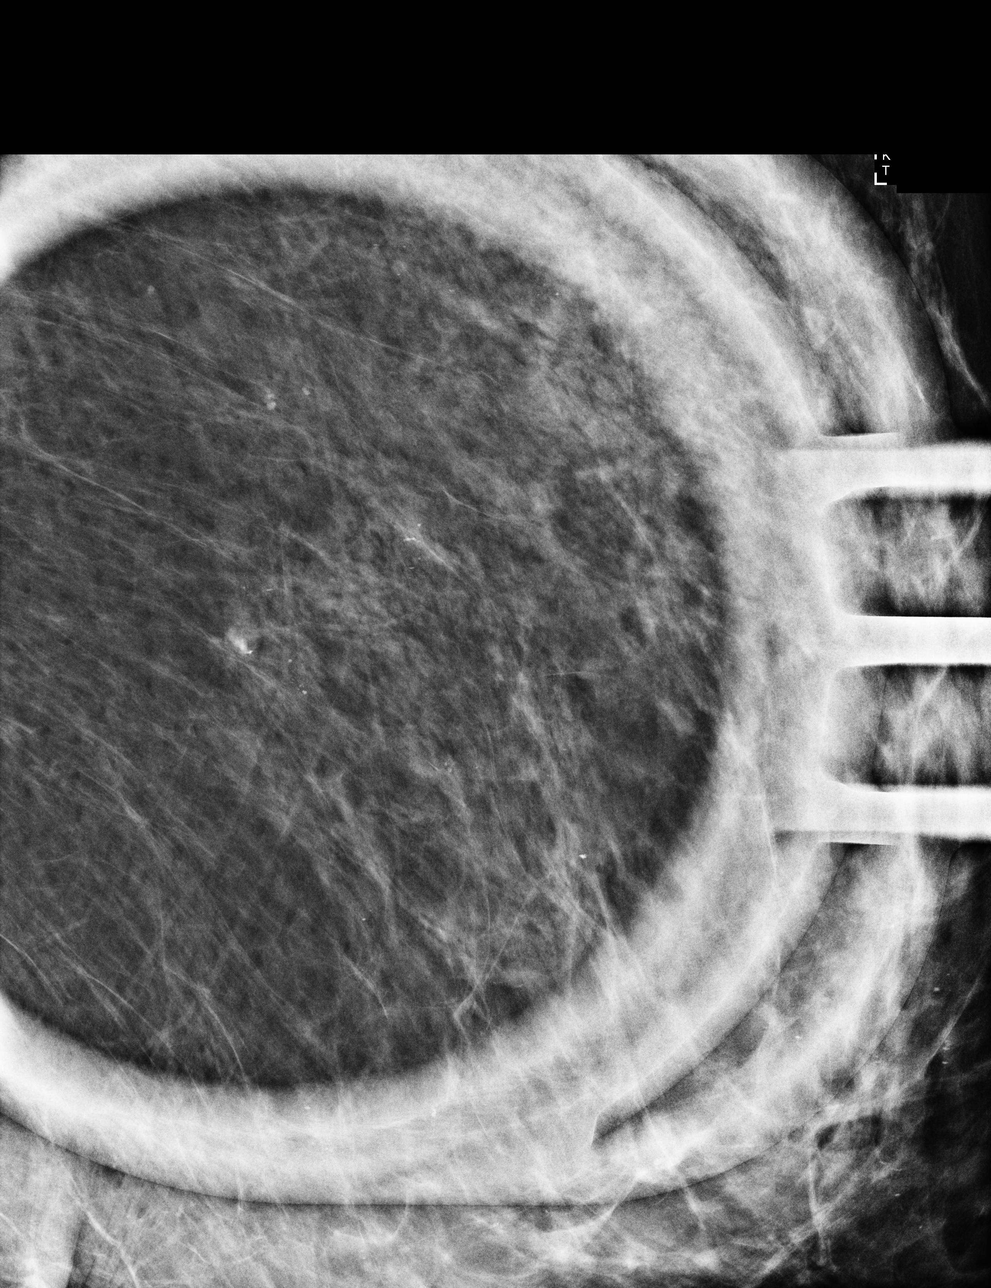

[L ML (3 of 3)]
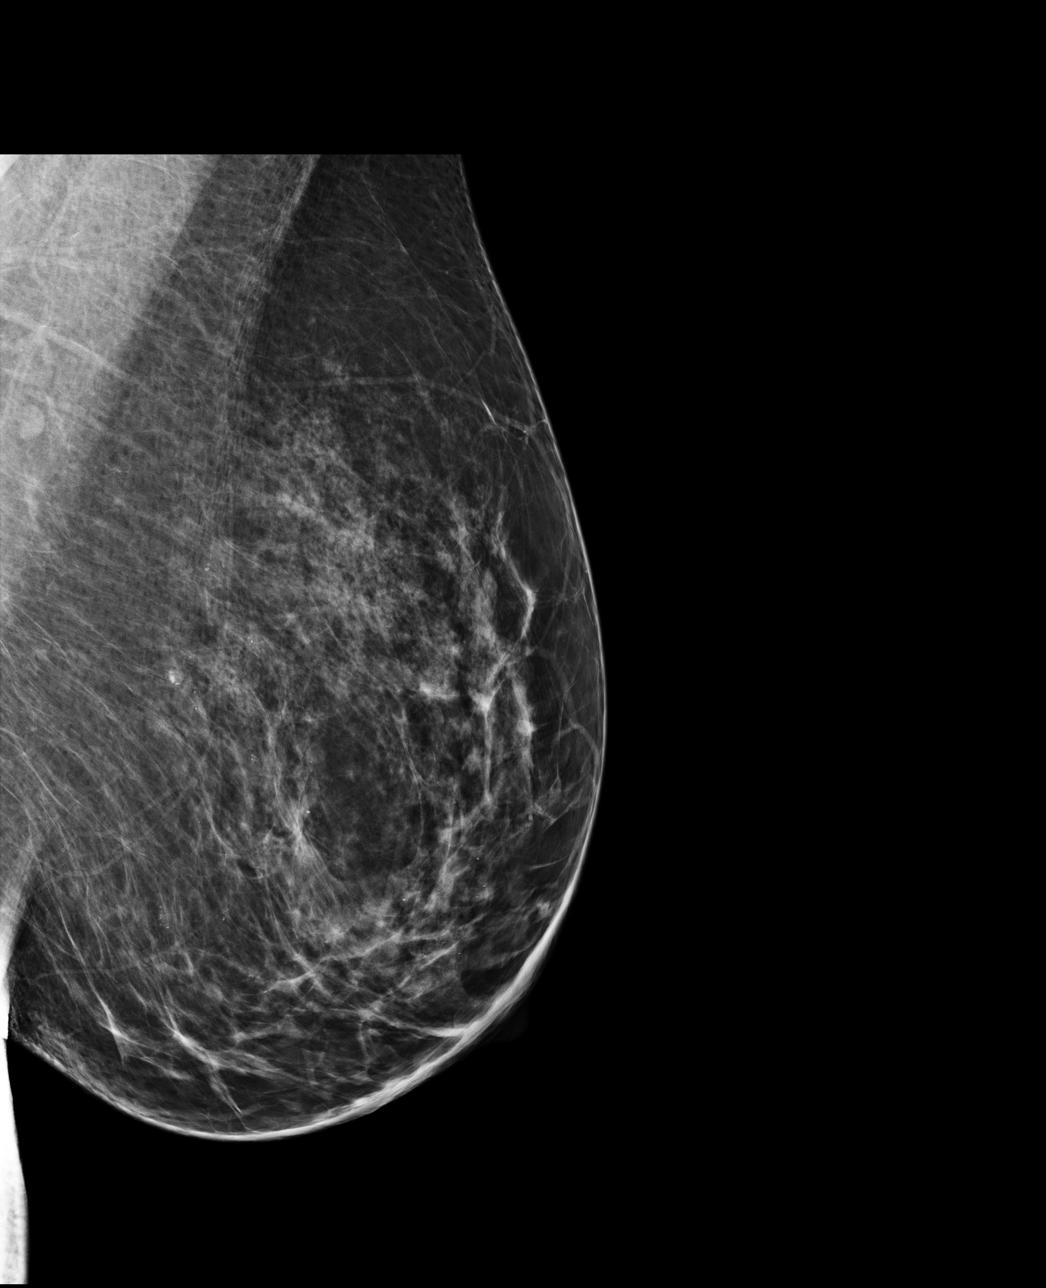

[4 of 4 positions shown; findings below may reference images not displayed]

ACR Breast Density Category b: There are scattered areas of
fibroglandular density.
FINDINGS: Spot magnification CC and mediolateral views of the calcifications
and a full field mediolateral view were obtained.

Spot magnification images confirm loosely grouped predominantly
punctate calcifications in the UPPER OUTER QUADRANT at posterior
depth, some of which appear to layer on the mediolateral image,
indicating benign milk calcium. The loosely grouped calcifications
span approximately 7 x 4 x 7 cm. There are no suspicious grouped
linear or branching forms.
IMPRESSION: Likely benign loosely grouped calcifications involving the UPPER
OUTER QUADRANT of the LEFT breast at posterior depth.

RECOMMENDATION:
Diagnostic LEFT mammogram to include spot magnification views of the
LEFT breast calcifications in 6 months.

I have discussed the findings and recommendations with the patient.
If applicable, a reminder letter will be sent to the patient
regarding the next appointment.

BI-RADS CATEGORY  3: Probably benign.

## 2021-04-15 DIAGNOSIS — G479 Sleep disorder, unspecified: Secondary | ICD-10-CM | POA: Diagnosis not present

## 2021-04-15 DIAGNOSIS — F419 Anxiety disorder, unspecified: Secondary | ICD-10-CM | POA: Diagnosis not present

## 2021-04-15 DIAGNOSIS — F513 Sleepwalking [somnambulism]: Secondary | ICD-10-CM | POA: Diagnosis not present

## 2021-04-17 NOTE — Telephone Encounter (Signed)
Called pt to schedule appt for sleep study order. Pt reports symptoms of abnormal sleep. Pt states she gets up in the middle of the night and psychiatrist wants to make sure she has no abnormal brain activity.

## 2021-05-20 ENCOUNTER — Telehealth (HOSPITAL_COMMUNITY): Payer: BC Managed Care – PPO | Admitting: Psychiatry

## 2021-05-20 ENCOUNTER — Telehealth: Payer: BC Managed Care – PPO | Admitting: Psychiatry

## 2021-05-20 DIAGNOSIS — F419 Anxiety disorder, unspecified: Secondary | ICD-10-CM | POA: Diagnosis not present

## 2021-05-20 DIAGNOSIS — G479 Sleep disorder, unspecified: Secondary | ICD-10-CM | POA: Diagnosis not present

## 2021-06-10 ENCOUNTER — Encounter: Payer: Self-pay | Admitting: Internal Medicine

## 2021-06-10 ENCOUNTER — Other Ambulatory Visit: Payer: Self-pay

## 2021-06-10 ENCOUNTER — Ambulatory Visit: Payer: BC Managed Care – PPO | Admitting: Internal Medicine

## 2021-06-10 VITALS — BP 100/72 | HR 86 | Temp 96.6°F | Resp 14 | Ht 66.0 in | Wt 180.6 lb

## 2021-06-10 DIAGNOSIS — N6310 Unspecified lump in the right breast, unspecified quadrant: Secondary | ICD-10-CM

## 2021-06-10 DIAGNOSIS — H6502 Acute serous otitis media, left ear: Secondary | ICD-10-CM

## 2021-06-10 DIAGNOSIS — H6690 Otitis media, unspecified, unspecified ear: Secondary | ICD-10-CM

## 2021-06-10 MED ORDER — AMOXICILLIN-POT CLAVULANATE 875-125 MG PO TABS: 1.0000 | ORAL_TABLET | Freq: Two times a day (BID) | ORAL | 0 refills | Status: DC

## 2021-06-10 MED ORDER — ERGOCALCIFEROL 1.25 MG (50000 UT) PO CAPS: 50000.0000 [IU] | ORAL_CAPSULE | ORAL | 0 refills | Status: DC

## 2021-06-10 MED ORDER — METHYLPREDNISOLONE ACETATE 40 MG/ML IJ SUSP
40.0000 mg | Freq: Once | INTRAMUSCULAR | Status: AC
  Administered 2021-06-10: 40 mg via INTRAMUSCULAR

## 2021-06-10 NOTE — Patient Instructions (Signed)
1) I am treating you for sinusitis/otitis   I am prescribing an antibiotic (AUGMENTIN ) and a prednisone SHOT manage the infection and the inflammation in your ear/sinuses.   I also advise use of the following OTC meds to help with your other symptoms.   Sudafed PE  10 to 30 mg every 8 hours for the congestion, you may substitute Afrin nasal spray for the nighttime dose of sudafed PE  If needed to prevent insomnia.  Please take a probiotic ( Align, Floraque or Culturelle) while you are on the antibiotic to prevent  the  serious antibiotic associated diarrhea  Called clostridium dificile colitis and a vaginal yeast infection   2) dIAGNOSTIC MAMMOGRAM OF RIGHT BREAST ORDERED  3) SLEEP STUDY TO BE ORDERED ONCE I CONFIRM WHAT THE LAB CAN DIAGNOSIS

## 2021-06-10 NOTE — Progress Notes (Signed)
Subjective:  Patient ID: Katelyn Hobbs, female    DOB: 1976/04/24  Age: 45 y.o. MRN: [id number]  CC: The primary encounter diagnosis was Mass of breast, right. Diagnoses of Acute otitis media, unspecified otitis media type and Non-recurrent acute serous otitis media of left ear were also pertinent to this visit.  HPI LASHERA CUROLE presents for 1) SINUSITIS 2) RIGHT BREAST NODULE , NEWLY APPRECIATED   This visit occurred during the SARS-CoV-2 public health emergency.  Safety protocols were in place, including screening questions prior to the visit, additional usage of staff PPE, and extensive cleaning of exam room while observing appropriate contact time as indicated for disinfecting solutions.   1) SINUSITIS: SYMPTOMS PRESENT FOR 7 DAYS,  COVID NEGATIVE BY HOME TEST X 5 TIMES.  SINUS PRESSURE, EAR ACHE, HEADACHES  2) RIGHT BREAST NODULE  FIRST NOTICED 3 WEEKS AGO, , WAS TENDER INITIALLY  ,  NO CHANGE IN SIZE  . NO HISTORY OF TRAUM A  3) SOMNAMBULATION SINCE CHILDHOOD.  EATS WHILE ASLEEP .  TAKES 2 MG ALPRAZOLAM ER AT NIGHT.   PSYCHIATRIST PLANNING TO WEAN HER FROM ALPRAZOLAM BUT BECAUSE SHE  SNORES,  RECOMMENDED SLEEP STUDY   Outpatient Medications Prior to Visit  Medication Sig Dispense Refill   ALPRAZolam (XANAX XR) 2 MG 24 hr tablet TAKE 1 TABLET BY MOUTH EVERYDAY AT BEDTIME 30 tablet 2   cetirizine (ZYRTEC) 10 MG tablet Take 10 mg by mouth daily.     cholecalciferol (VITAMIN D3) 25 MCG (1000 UNIT) tablet Take 1,000 Units by mouth daily.     citalopram (CELEXA) 40 MG tablet Take 1 tablet (40 mg total) by mouth daily. 90 tablet 1   diltiazem (CARDIZEM) 30 MG tablet TAKE 1 TABLET BY MOUTH AS NEEDED FOR TACHYCARDIA 90 tablet 1   ergocalciferol (DRISDOL) 1.25 MG (50000 UT) capsule Take 1 capsule (50,000 Units total) by mouth once a week. (Patient not taking: Reported on 06/10/2021) 12 capsule 0   No facility-administered medications prior to visit.    Review of Systems;  Patient  denies headache, fevers, malaise, unintentional weight loss, skin rash, eye pain,  sore throat, dysphagia,  hemoptysis , cough, dyspnea, wheezing, chest pain, palpitations, orthopnea, edema, abdominal pain, nausea, melena, diarrhea, constipation, flank pain, dysuria, hematuria, urinary  Frequency, nocturia, numbness, tingling, seizures,  Focal weakness, Loss of consciousness,  Tremor, insomnia, depression, anxiety, and suicidal ideation.      Objective:  BP 100/72 (BP Location: Left Arm, Patient Position: Sitting, Cuff Size: Normal)   Pulse 86   Temp (!) 96.6 F (35.9 C) (Temporal)   Resp 14   Ht '5\' 6"'$  (1.676 m)   Wt 180 lb 9.6 oz (81.9 kg)   SpO2 98%   BMI 29.15 kg/m   BP Readings from Last 3 Encounters:  06/10/21 100/72  01/07/21 92/66  04/16/20 120/70    Wt Readings from Last 3 Encounters:  06/10/21 180 lb 9.6 oz (81.9 kg)  01/07/21 173 lb (78.5 kg)  12/11/20 162 lb (73.5 kg)    General appearance: alert, cooperative and appears stated age Ears: normal TM's and external ear canals both ears Throat: lips, mucosa, and tongue normal; teeth and gums normal Neck: no adenopathy, no carotid bruit, supple, symmetrical, trachea midline and thyroid not enlarged, symmetric, no tenderness/mass/nodules Back: symmetric, no curvature. ROM normal. No CVA tenderness. Breast:  right breast with palpable mobile mass at 11:00 position  Lungs: clear to auscultation bilaterally Heart: regular rate and rhythm, S1, S2 normal,  no murmur, click, rub or gallop Abdomen: soft, non-tender; bowel sounds normal; no masses,  no organomegaly Pulses: 2+ and symmetric Skin: Skin color, texture, turgor normal. No rashes or lesions Lymph nodes: Cervical, supraclavicular, and axillary nodes normal.  Lab Results  Component Value Date   HGBA1C 5.1 05/17/2018   HGBA1C 5.3 02/18/2016    Lab Results  Component Value Date   CREATININE 0.70 01/07/2021   CREATININE 0.73 05/17/2018   CREATININE 0.59 12/15/2016     Lab Results  Component Value Date   WBC 7.8 04/16/2020   HGB 14.2 04/16/2020   HCT 40.8 04/16/2020   PLT 221.0 04/16/2020   GLUCOSE 92 01/07/2021   CHOL 206 (H) 01/07/2021   TRIG 185 (H) 01/07/2021   HDL 44 (L) 01/07/2021   LDLCALC 130 (H) 01/07/2021   ALT 10 01/07/2021   AST 12 01/07/2021   NA 138 01/07/2021   K 3.5 01/07/2021   CL 103 01/07/2021   CREATININE 0.70 01/07/2021   BUN 8 01/07/2021   CO2 27 01/07/2021   TSH 2.33 04/16/2020   HGBA1C 5.1 05/17/2018    MM Digital Diagnostic Unilat L  Result Date: 03/31/2021 CLINICAL DATA:  Recall from screening mammography, calcifications involving the UPPER OUTER QUADRANT of the LEFT breast at POSTERIOR depth. Family history of breast cancer in her maternal grandmother who died at age 59. EXAM: DIGITAL DIAGNOSTIC UNILATERAL LEFT MAMMOGRAM WITH CAD TECHNIQUE: Left digital diagnostic mammography was performed. Mammographic images were processed with CAD. COMPARISON:  Previous exam(s). ACR Breast Density Category b: There are scattered areas of fibroglandular density. FINDINGS: Spot magnification CC and mediolateral views of the calcifications and a full field mediolateral view were obtained. Spot magnification images confirm loosely grouped predominantly punctate calcifications in the UPPER OUTER QUADRANT at posterior depth, some of which appear to layer on the mediolateral image, indicating benign milk calcium. The loosely grouped calcifications span approximately 7 x 4 x 7 cm. There are no suspicious grouped linear or branching forms. IMPRESSION: Likely benign loosely grouped calcifications involving the UPPER OUTER QUADRANT of the LEFT breast at posterior depth. RECOMMENDATION: Diagnostic LEFT mammogram to include spot magnification views of the LEFT breast calcifications in 6 months. I have discussed the findings and recommendations with the patient. If applicable, a reminder letter will be sent to the patient regarding the next  appointment. BI-RADS CATEGORY  3: Probably benign. Electronically Signed   By: Evangeline Dakin M.D.   On: 03/31/2021 13:50    Assessment & Plan:   Problem List Items Addressed This Visit       Unprioritized   Otitis media    With sinusitis. Empiric augmentin ,  Decongestants and  Dexamethasone injection       Relevant Medications   amoxicillin-clavulanate (AUGMENTIN) 875-125 MG tablet   Mass of breast, right - Primary    First palpated 3-4 weeks ago by patient, initially tender.  Diagnostic mammogram ordered        Relevant Orders   US BREAST LTD UNI RIGHT INC AXILLA   MM DIAG BREAST TOMO UNI RIGHT    I have discontinued Thayer Headings C. Faulkner's ergocalciferol. I am also having her start on amoxicillin-clavulanate and ergocalciferol. Additionally, I am having her maintain her cholecalciferol, cetirizine, diltiazem, citalopram, and ALPRAZolam. We administered methylPREDNISolone acetate.  Meds ordered this encounter  Medications   amoxicillin-clavulanate (AUGMENTIN) 875-125 MG tablet    Sig: Take 1 tablet by mouth 2 (two) times daily.    Dispense:  14 tablet  Refill:  0   ergocalciferol (DRISDOL) 1.25 MG (50000 UT) capsule    Sig: Take 1 capsule (50,000 Units total) by mouth once a week.    Dispense:  12 capsule    Refill:  0   methylPREDNISolone acetate (DEPO-MEDROL) injection 40 mg    Medications Discontinued During This Encounter  Medication Reason   ergocalciferol (DRISDOL) 1.25 MG (50000 UT) capsule Completed Course    Follow-up: No follow-ups on file.   Crecencio Mc, MD

## 2021-06-11 DIAGNOSIS — N6310 Unspecified lump in the right breast, unspecified quadrant: Secondary | ICD-10-CM | POA: Insufficient documentation

## 2021-06-11 DIAGNOSIS — H6690 Otitis media, unspecified, unspecified ear: Secondary | ICD-10-CM | POA: Insufficient documentation

## 2021-06-11 NOTE — Assessment & Plan Note (Signed)
First palpated 3-4 weeks ago by patient, initially tender.  Diagnostic mammogram ordered

## 2021-06-11 NOTE — Assessment & Plan Note (Signed)
With sinusitis. Empiric augmentin ,  Decongestants and  Dexamethasone injection

## 2021-06-23 DIAGNOSIS — F513 Sleepwalking [somnambulism]: Secondary | ICD-10-CM

## 2021-06-23 DIAGNOSIS — G4700 Insomnia, unspecified: Secondary | ICD-10-CM

## 2021-06-24 DIAGNOSIS — F419 Anxiety disorder, unspecified: Secondary | ICD-10-CM | POA: Diagnosis not present

## 2021-06-24 DIAGNOSIS — G479 Sleep disorder, unspecified: Secondary | ICD-10-CM | POA: Diagnosis not present

## 2021-06-26 DIAGNOSIS — C801 Malignant (primary) neoplasm, unspecified: Secondary | ICD-10-CM

## 2021-06-26 HISTORY — DX: Malignant (primary) neoplasm, unspecified: C80.1

## 2021-07-01 ENCOUNTER — Ambulatory Visit
Admission: RE | Admit: 2021-07-01 | Discharge: 2021-07-01 | Disposition: A | Discharge status: Home / Self Care | Payer: BC Managed Care – PPO | Source: Ambulatory Visit | Attending: Internal Medicine | Admitting: Internal Medicine

## 2021-07-01 ENCOUNTER — Other Ambulatory Visit: Payer: Self-pay

## 2021-07-01 ENCOUNTER — Other Ambulatory Visit: Payer: Self-pay | Admitting: Internal Medicine

## 2021-07-01 DIAGNOSIS — R922 Inconclusive mammogram: Secondary | ICD-10-CM | POA: Diagnosis not present

## 2021-07-01 DIAGNOSIS — N6310 Unspecified lump in the right breast, unspecified quadrant: Secondary | ICD-10-CM

## 2021-07-01 IMAGING — US US BREAST*R* LIMITED INC AXILLA
1 series · 10 of 10 positions shown · non-contrast
Comparison: Previous exam(s).

CLINICAL DATA: 45-year-old female with a palpable right breast
lump.

EXAM:
DIGITAL DIAGNOSTIC UNILATERAL RIGHT MAMMOGRAM WITH TOMOSYNTHESIS AND
CAD; ULTRASOUND RIGHT BREAST LIMITED
TECHNIQUE: Right digital diagnostic mammography and breast tomosynthesis was
performed. The images were evaluated with computer-aided detection.;
Targeted ultrasound examination of the right breast was performed

[Series 1: us breast*right* limited inc axilla · 0.07mm/px · 10 of 10 slices shown]
[im 1/10]
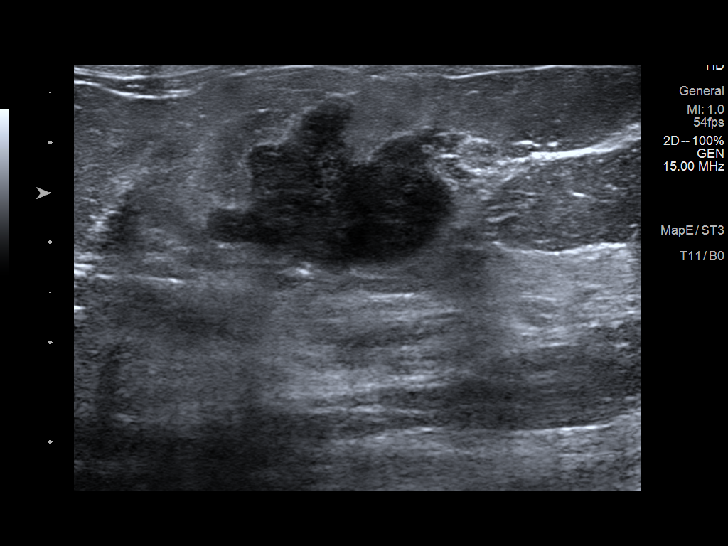
[im 2/10]
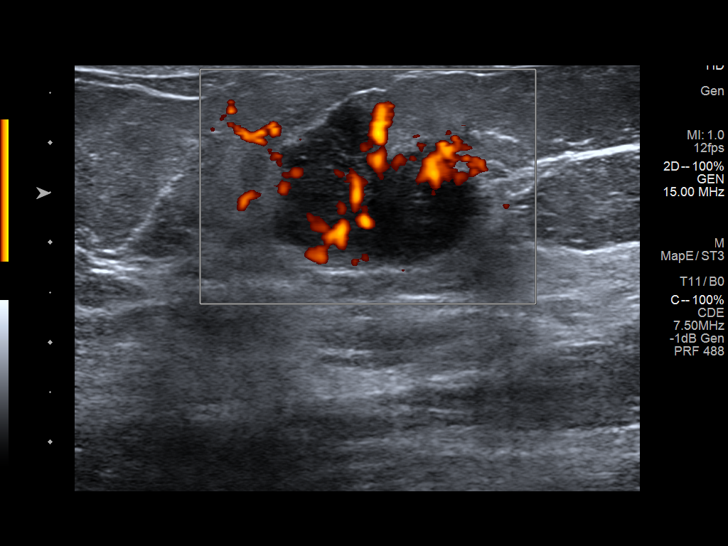
[im 3/10]
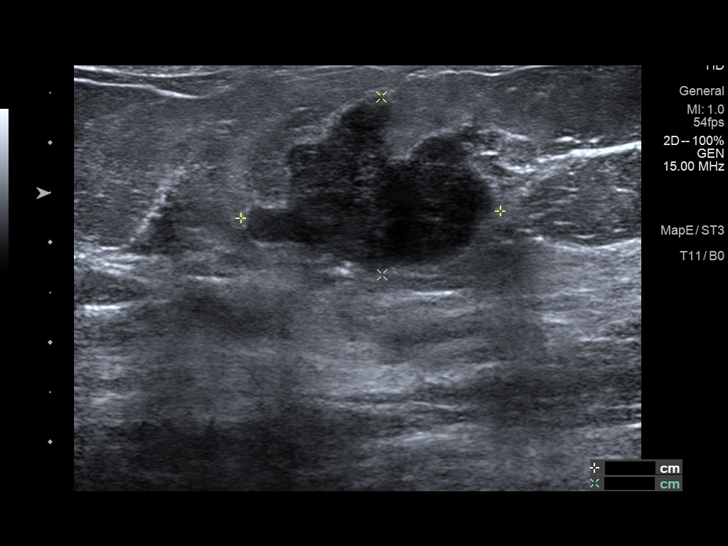
[im 4/10]
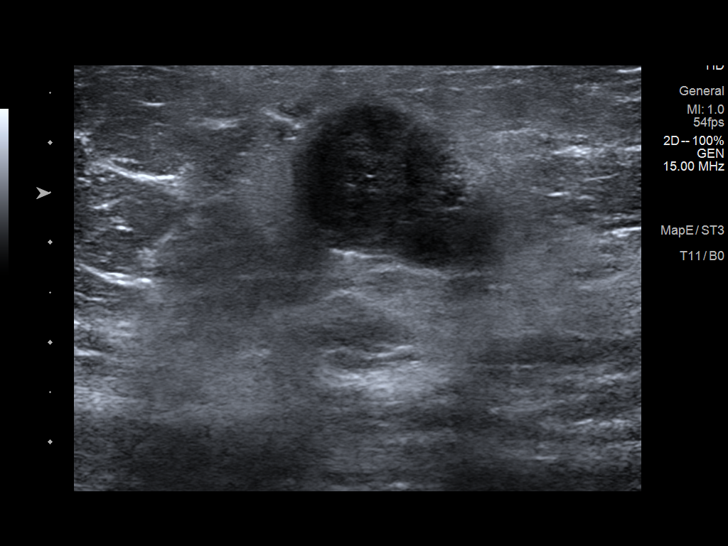
[im 5/10]
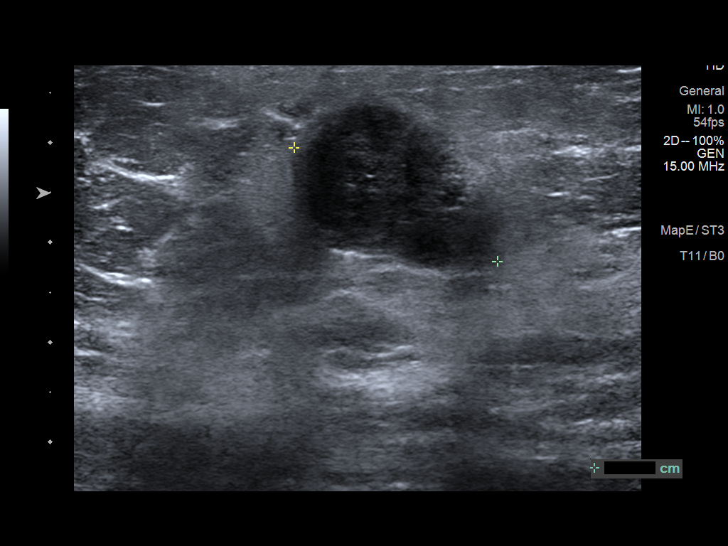
[im 6/10]
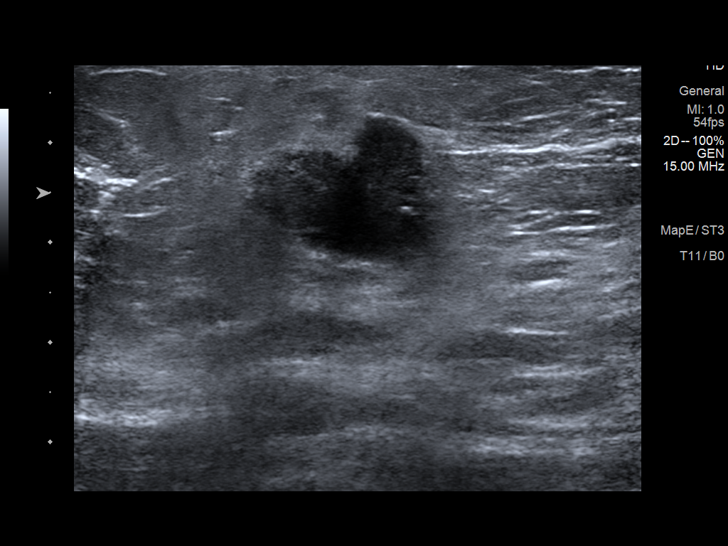
[im 7/10]
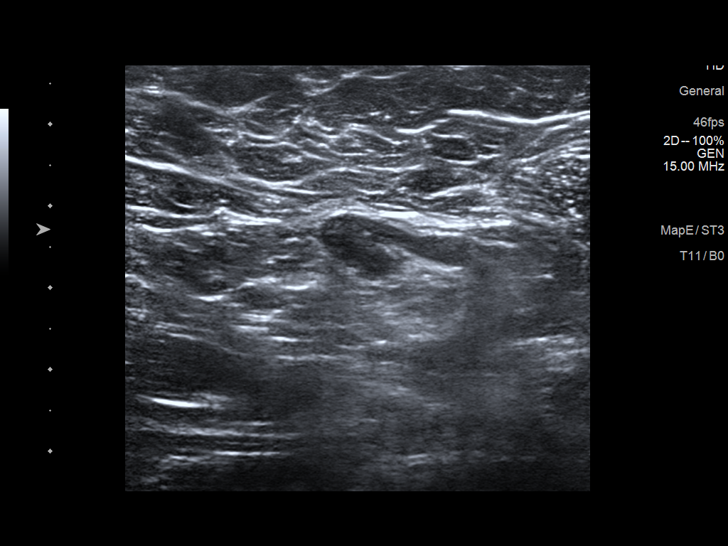
[im 8/10]
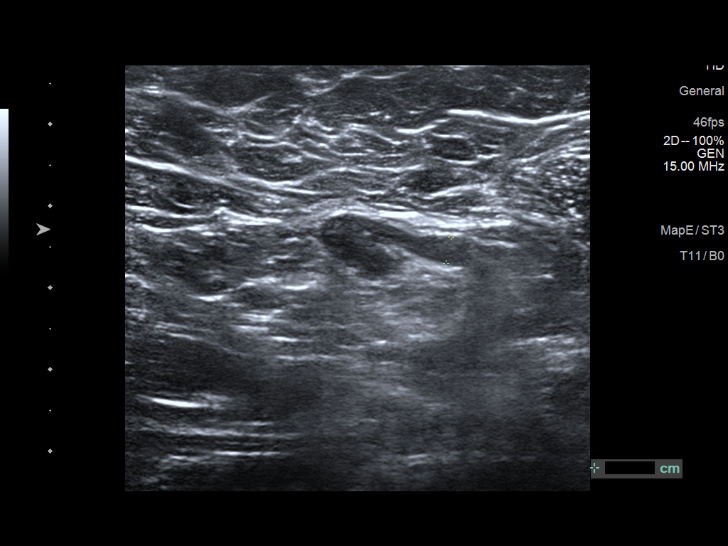
[im 9/10]
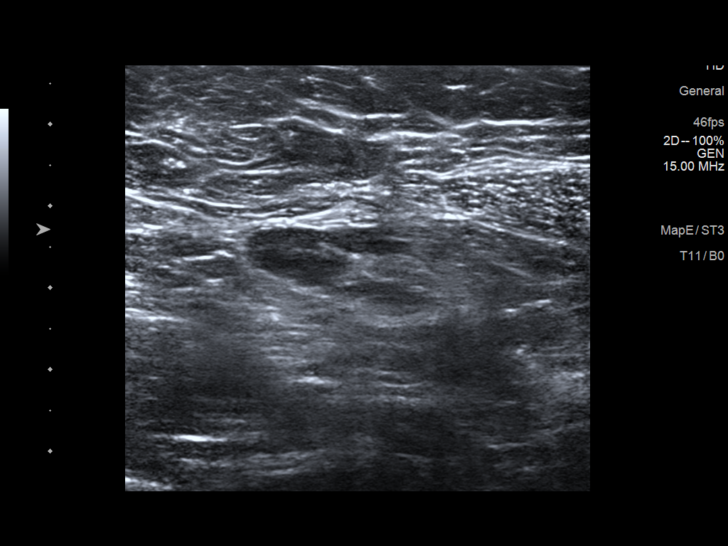
[im 10/10]
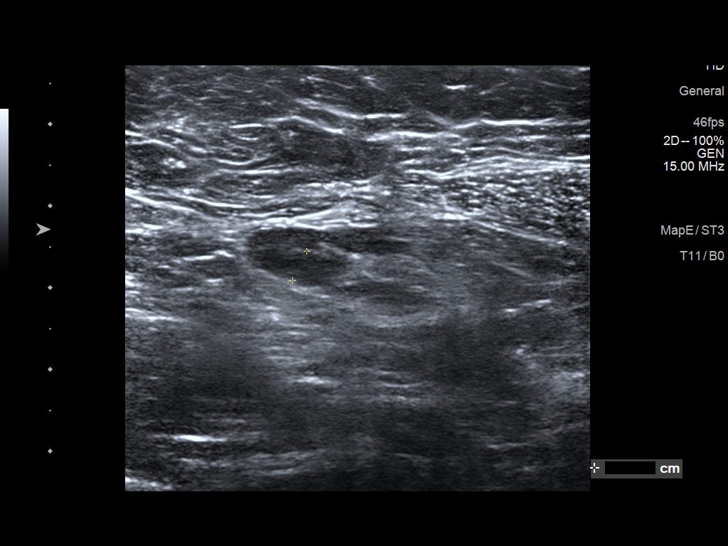

[10 of 10 positions shown; findings below may reference images not displayed]

ACR Breast Density Category b: There are scattered areas of
fibroglandular density.
FINDINGS: A radiopaque BB was placed at the site of the patient's palpable
lump in the upper-outer right breast. An irregular, hyperdense mass
is seen deep to the radiopaque BB. No additional suspicious findings
in the remainder of the right breast. Further evaluation with
ultrasound was performed.

Targeted ultrasound is performed, showing an irregular, hypoechoic
mass with associated vascularity. It measures 2.6 x 1.8 x 2.3 cm.
This correlates well with the mammographic finding. Evaluation of
the right axilla demonstrates a single lymph node with diffuse
cortical thickening up to 5 mm.
IMPRESSION: 1. Highly suspicious right breast mass corresponding with the
patient's palpable lump. Recommendation is for ultrasound-guided
biopsy.
2. Indeterminate single right axillary lymph node with up to 5 mm
diffuse cortical thickening. Recommendation is for ultrasound-guided
biopsy.

RECOMMENDATION:
1. Two area ultrasound-guided biopsy of the right breast and right
axilla.
2. Pending biopsy results, further evaluation of the patient's
previously evaluated left breast calcifications (diagnostic study
[DATE]) is recommended.
3. Additionally, consider further evaluation with contrast enhanced
breast MRI.

I have discussed the findings and recommendations with the patient.
If applicable, a reminder letter will be sent to the patient
regarding the next appointment.

BI-RADS CATEGORY  4: Suspicious.

## 2021-07-01 IMAGING — MG MM DIGITAL DIAGNOSTIC UNILAT*R* W/ TOMO W/ CAD
6 series · 6 of 18 positions shown · non-contrast
Comparison: Previous exam(s).

CLINICAL DATA: 45-year-old female with a palpable right breast
lump.

EXAM:
DIGITAL DIAGNOSTIC UNILATERAL RIGHT MAMMOGRAM WITH TOMOSYNTHESIS AND
CAD; ULTRASOUND RIGHT BREAST LIMITED
TECHNIQUE: Right digital diagnostic mammography and breast tomosynthesis was
performed. The images were evaluated with computer-aided detection.;
Targeted ultrasound examination of the right breast was performed

[R MLO synth-2D]
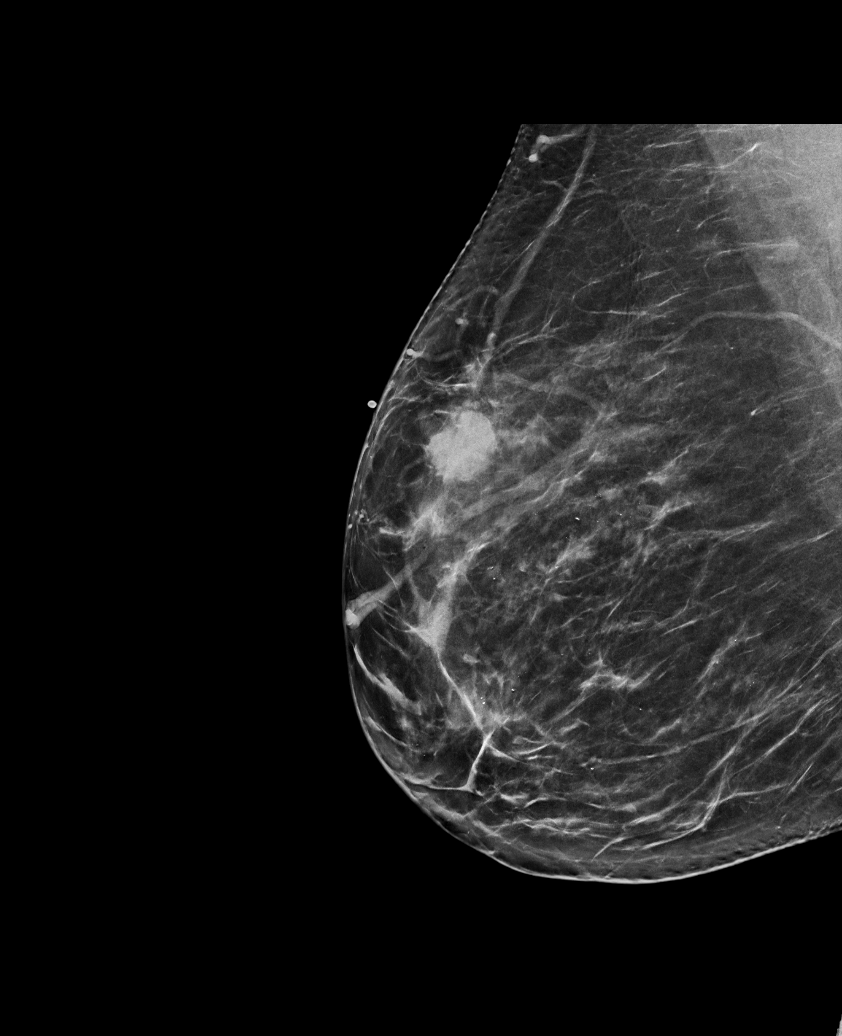

[R TAN synth-2D]
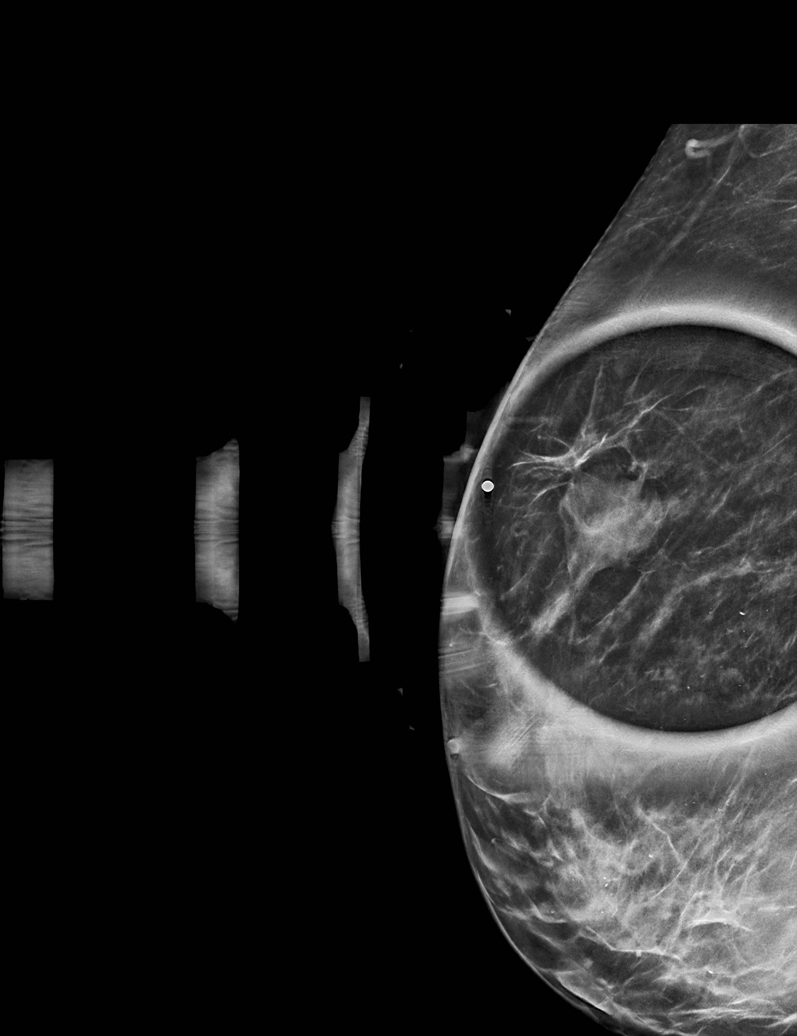

[R CC synth-2D]
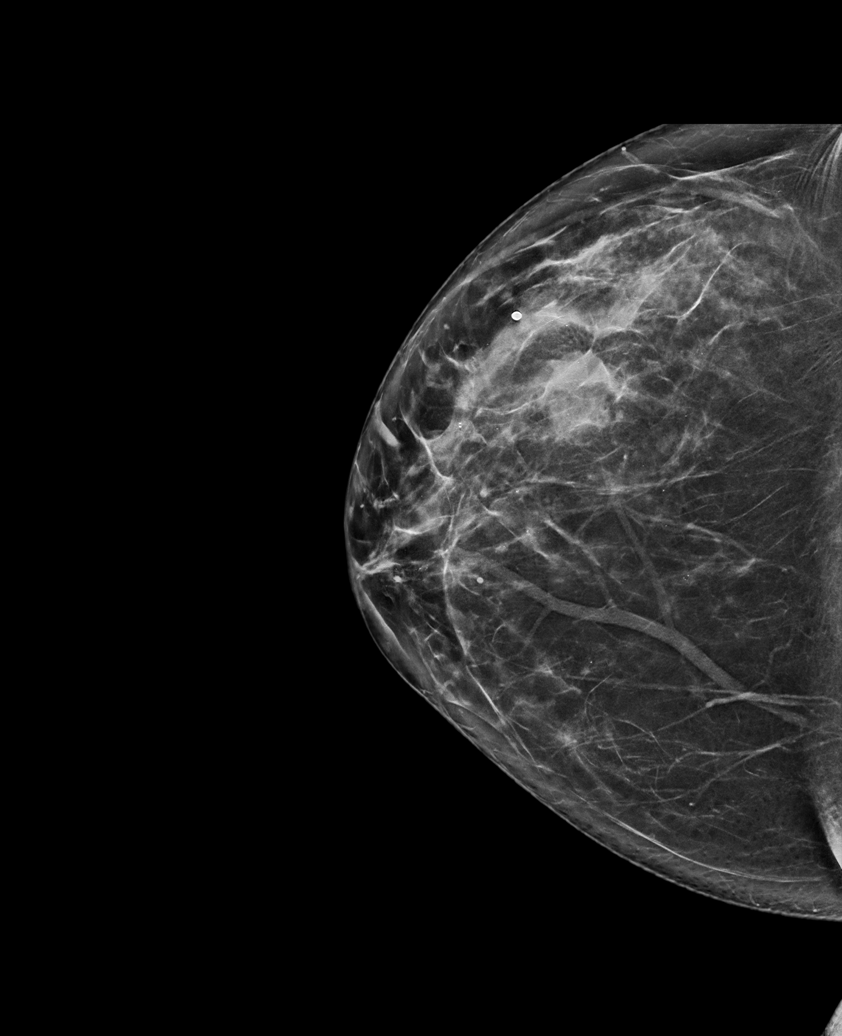

[R TAN tomo · tomo slice 31/62.0]
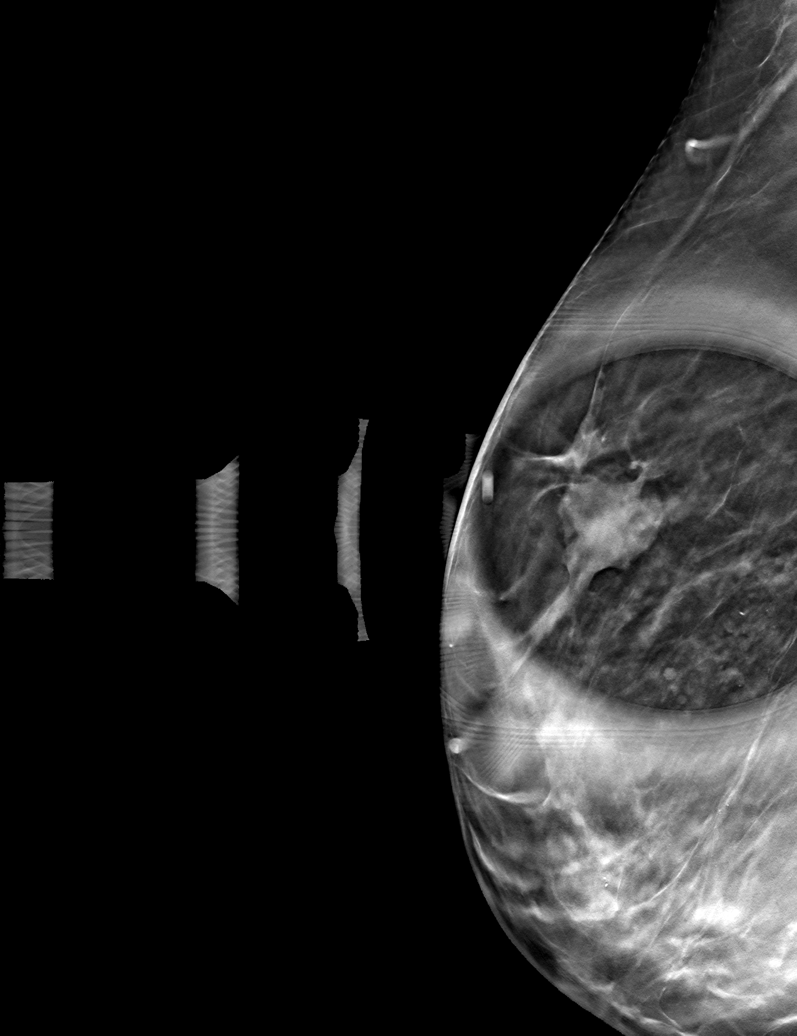

[R CC tomo · tomo slice 37/74.0]
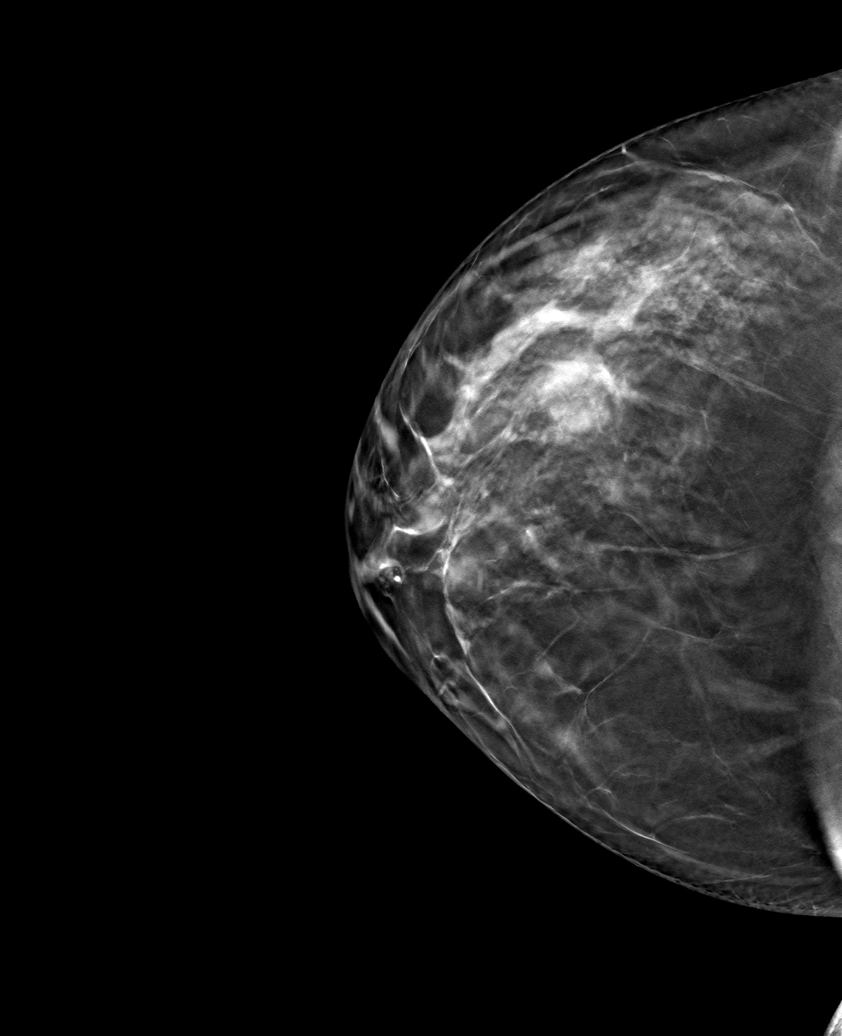

[R MLO tomo · tomo slice 40/79.0]
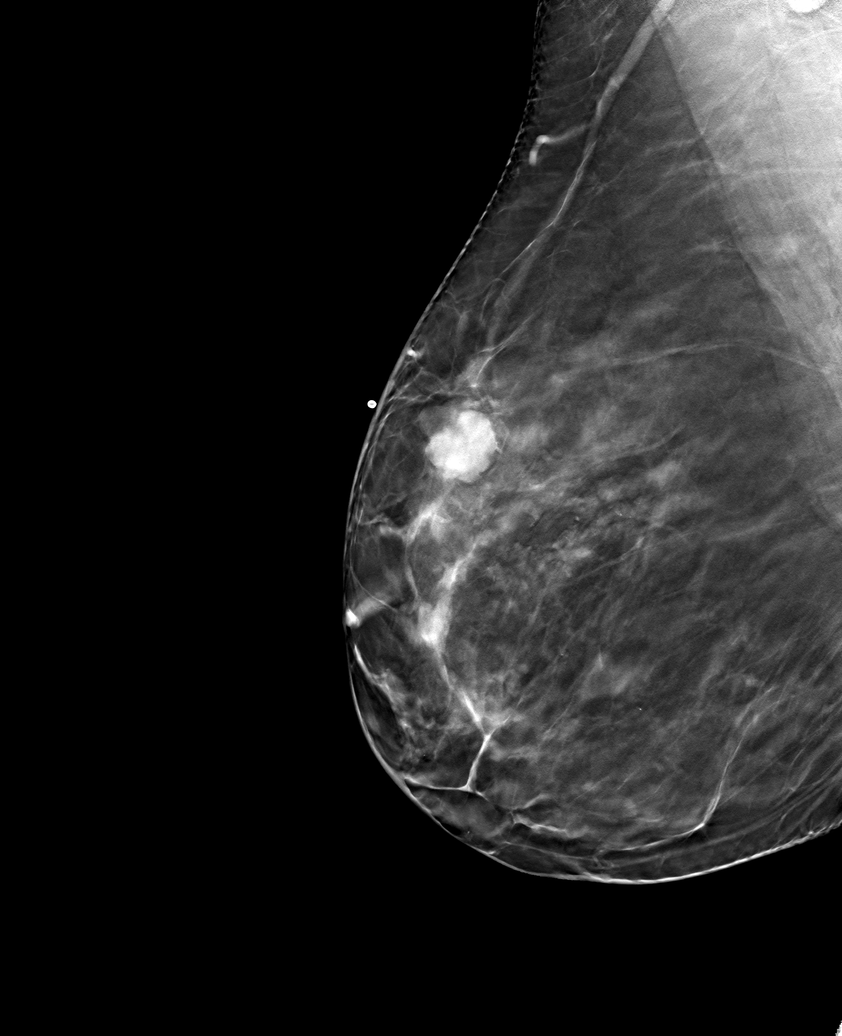

[6 of 18 positions shown; findings below may reference images not displayed]

ACR Breast Density Category b: There are scattered areas of
fibroglandular density.
FINDINGS: A radiopaque BB was placed at the site of the patient's palpable
lump in the upper-outer right breast. An irregular, hyperdense mass
is seen deep to the radiopaque BB. No additional suspicious findings
in the remainder of the right breast. Further evaluation with
ultrasound was performed.

Targeted ultrasound is performed, showing an irregular, hypoechoic
mass with associated vascularity. It measures 2.6 x 1.8 x 2.3 cm.
This correlates well with the mammographic finding. Evaluation of
the right axilla demonstrates a single lymph node with diffuse
cortical thickening up to 5 mm.
IMPRESSION: 1. Highly suspicious right breast mass corresponding with the
patient's palpable lump. Recommendation is for ultrasound-guided
biopsy.
2. Indeterminate single right axillary lymph node with up to 5 mm
diffuse cortical thickening. Recommendation is for ultrasound-guided
biopsy.

RECOMMENDATION:
1. Two area ultrasound-guided biopsy of the right breast and right
axilla.
2. Pending biopsy results, further evaluation of the patient's
previously evaluated left breast calcifications (diagnostic study
[DATE]) is recommended.
3. Additionally, consider further evaluation with contrast enhanced
breast MRI.

I have discussed the findings and recommendations with the patient.
If applicable, a reminder letter will be sent to the patient
regarding the next appointment.

BI-RADS CATEGORY  4: Suspicious.

## 2021-07-03 ENCOUNTER — Other Ambulatory Visit: Payer: Self-pay | Admitting: Internal Medicine

## 2021-07-03 DIAGNOSIS — N63 Unspecified lump in unspecified breast: Secondary | ICD-10-CM

## 2021-07-03 DIAGNOSIS — R921 Mammographic calcification found on diagnostic imaging of breast: Secondary | ICD-10-CM

## 2021-07-03 NOTE — Progress Notes (Signed)
b

## 2021-07-08 ENCOUNTER — Other Ambulatory Visit: Payer: Self-pay | Admitting: Internal Medicine

## 2021-07-08 ENCOUNTER — Ambulatory Visit
Admission: RE | Admit: 2021-07-08 | Discharge: 2021-07-08 | Disposition: A | Discharge status: Home / Self Care | Payer: BC Managed Care – PPO | Source: Ambulatory Visit | Attending: Internal Medicine | Admitting: Internal Medicine

## 2021-07-08 ENCOUNTER — Other Ambulatory Visit: Payer: Self-pay

## 2021-07-08 DIAGNOSIS — N6310 Unspecified lump in the right breast, unspecified quadrant: Secondary | ICD-10-CM

## 2021-07-08 DIAGNOSIS — R590 Localized enlarged lymph nodes: Secondary | ICD-10-CM | POA: Diagnosis not present

## 2021-07-08 DIAGNOSIS — F419 Anxiety disorder, unspecified: Secondary | ICD-10-CM | POA: Diagnosis not present

## 2021-07-08 DIAGNOSIS — G479 Sleep disorder, unspecified: Secondary | ICD-10-CM | POA: Diagnosis not present

## 2021-07-08 DIAGNOSIS — C50411 Malignant neoplasm of upper-outer quadrant of right female breast: Secondary | ICD-10-CM | POA: Diagnosis not present

## 2021-07-08 DIAGNOSIS — N6311 Unspecified lump in the right breast, upper outer quadrant: Secondary | ICD-10-CM | POA: Diagnosis not present

## 2021-07-08 DIAGNOSIS — Z170 Estrogen receptor positive status [ER+]: Secondary | ICD-10-CM | POA: Diagnosis not present

## 2021-07-08 IMAGING — MG MM BREAST LOCALIZATION CLIP
6 series · 6 of 18 positions shown · non-contrast
Comparison: Previous exam(s).

CLINICAL DATA: Patient status post ultrasound-guided biopsy right
breast mass and right axillary lymph node

EXAM:
3D DIAGNOSTIC RIGHT MAMMOGRAM POST ULTRASOUND BIOPSY

[R CC synth-2D]
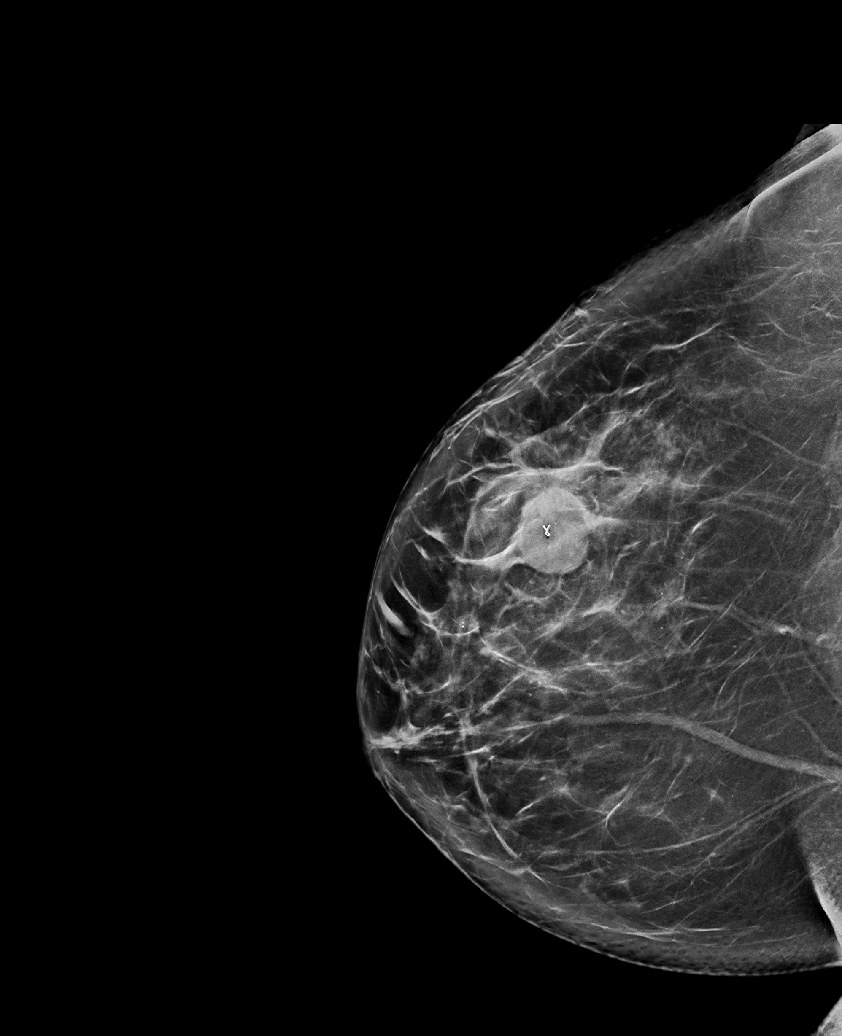

[R ML synth-2D]
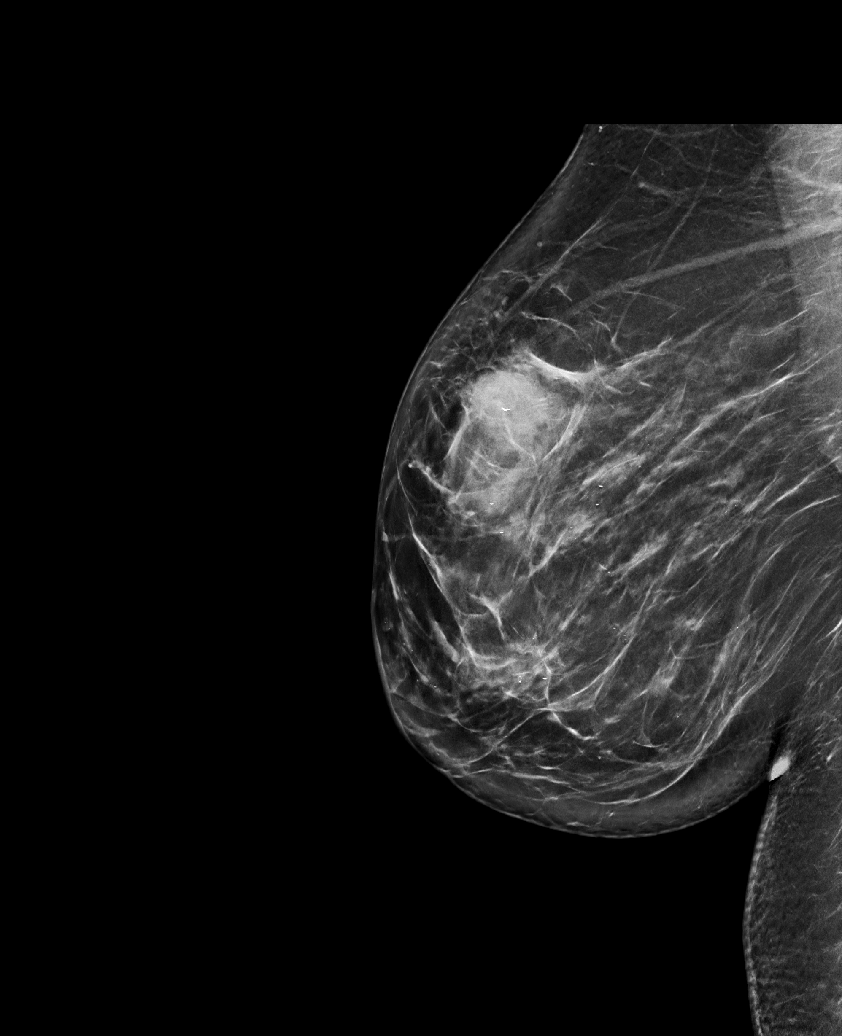

[R MLO synth-2D]
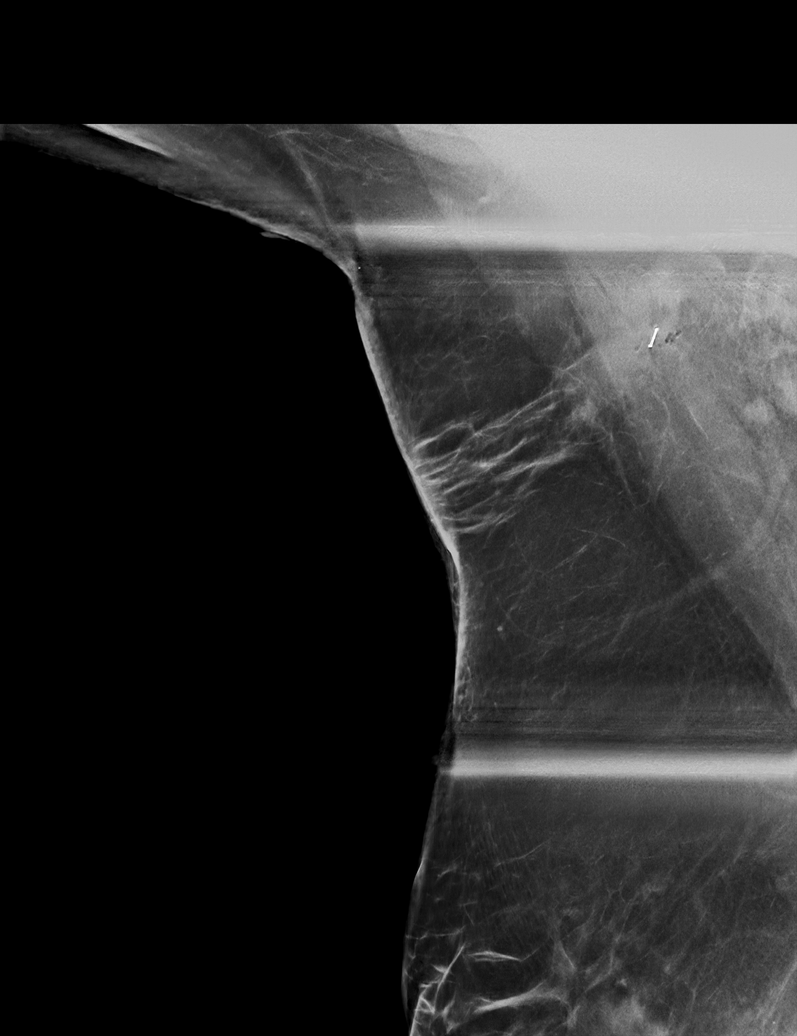

[R MLO tomo · tomo slice 51/102.0]
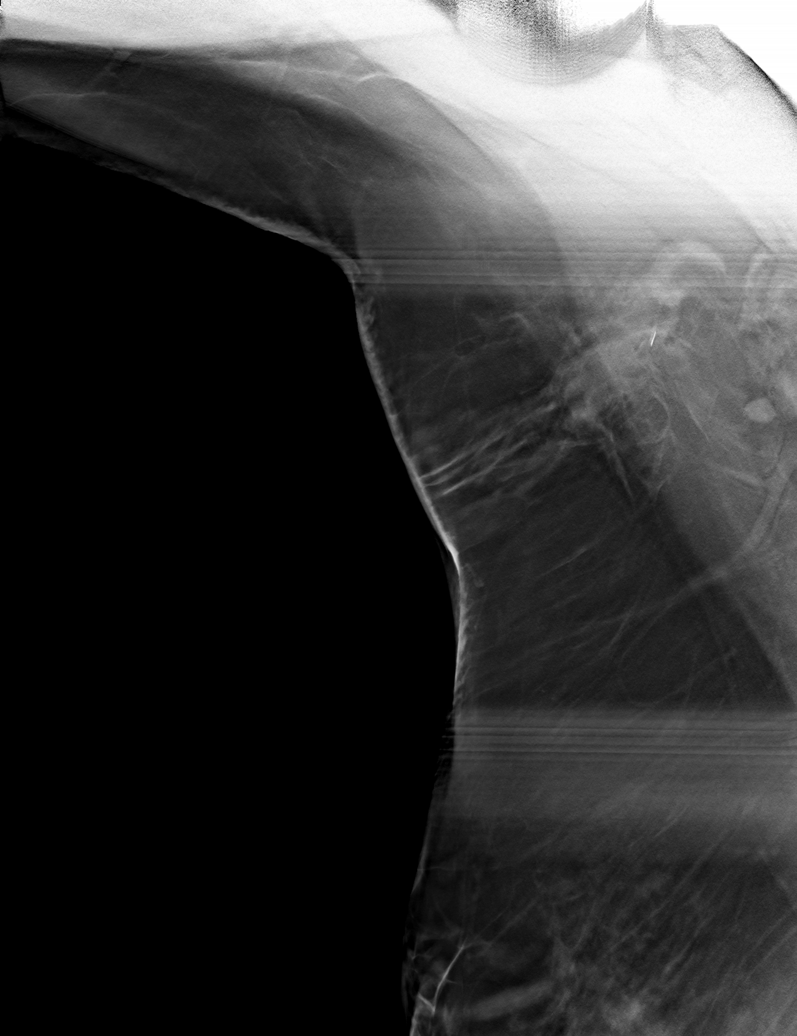

[R CC tomo · tomo slice 45/90.0]
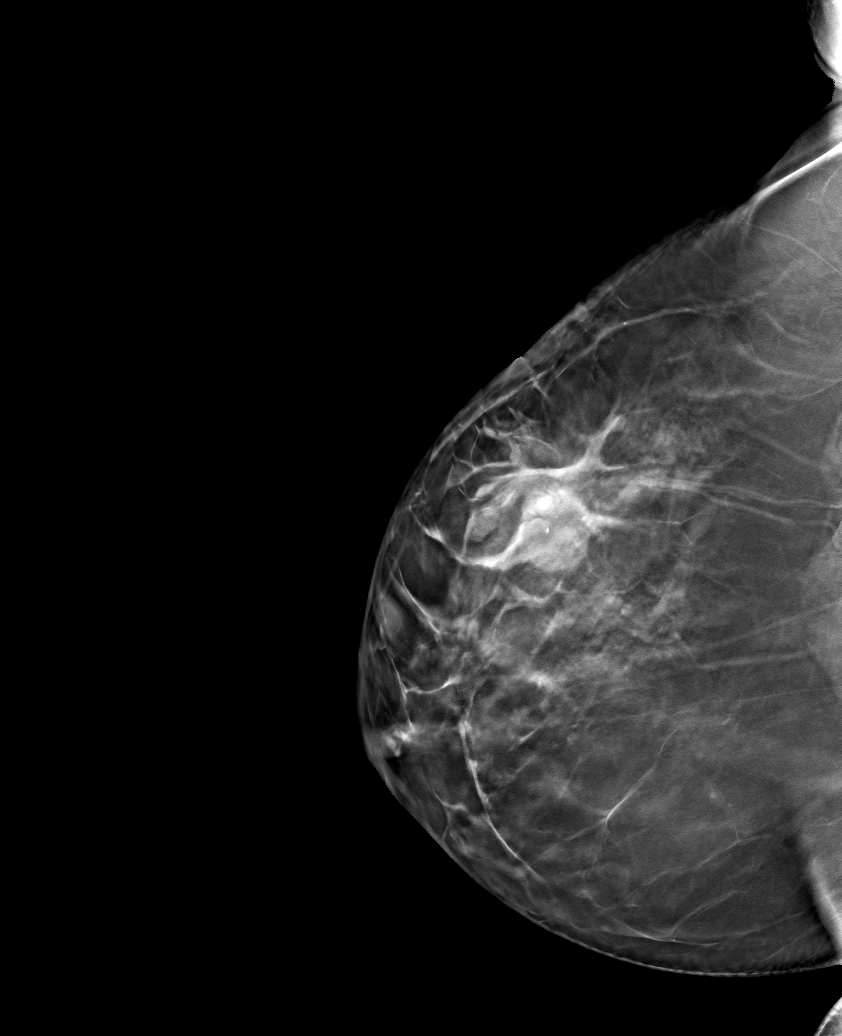

[R ML tomo · tomo slice 45/88.0]
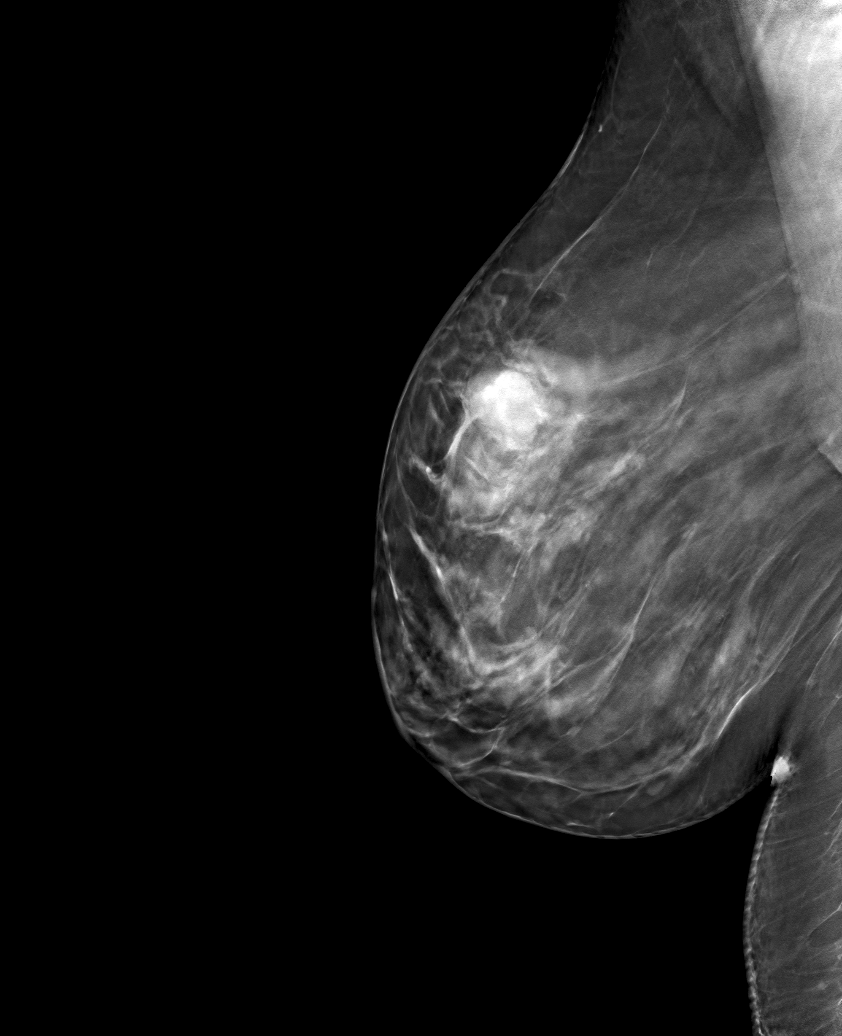

[6 of 18 positions shown; findings below may reference images not displayed]

FINDINGS: 3D Mammographic images were obtained following ultrasound guided
biopsy of right breast mass and right axillary lymph node.

Site 1: Right breast mass 11:30 o'clock: Ribbon shaped clip: In
appropriate position.

Site 2: Right axillary lymph node: CHIN clip: In appropriate
position.
IMPRESSION: Appropriate positioning of the biopsy marking clips as above

Final Assessment: Post Procedure Mammograms for Marker Placement

## 2021-07-08 IMAGING — US US  BREAST BX W/ LOC DEV 1ST LESION IMG BX SPEC US GUIDE*R*
1 series · 12 of 13 positions shown · non-contrast
Comparison: Previous exam(s).
COMPARISON: Previous exam(s).

Addendum:
CLINICAL DATA: Patient with indeterminate right breast mass and
right axillary lymph node.

EXAM:
ULTRASOUND GUIDED RIGHT BREAST CORE NEEDLE BIOPSY

[Series 1: us breast bx w/ loc dev 1st lesion img bx spec us  · 0.07mm/px · 12 of 13 slices shown]
[im 1/13]
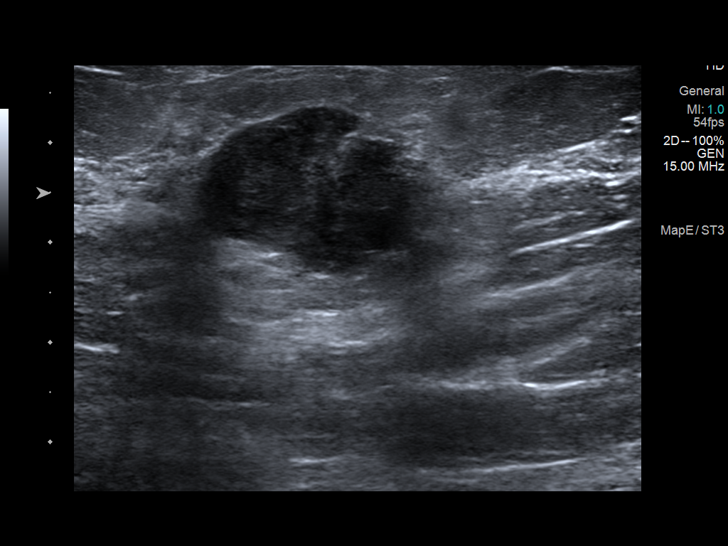
[im 2/13]
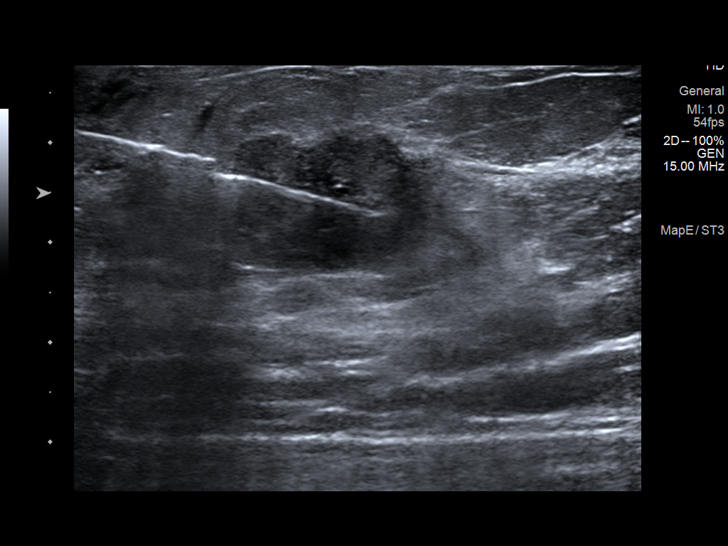
[im 3/13]
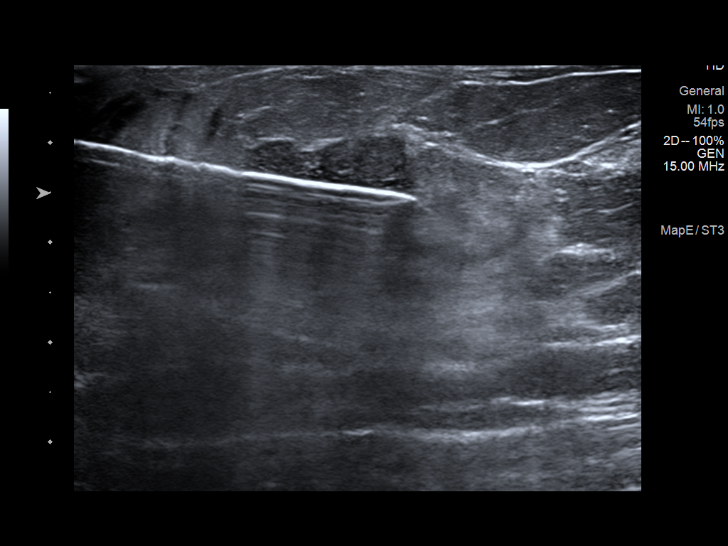
[im 4/13]
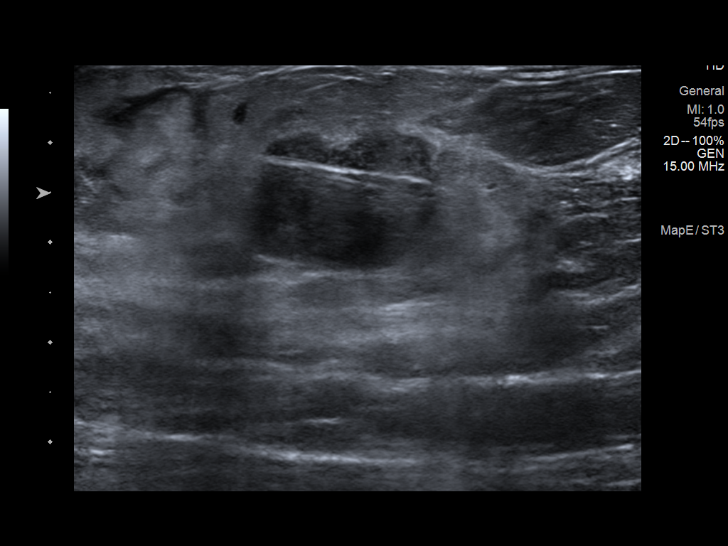
[im 5/13]
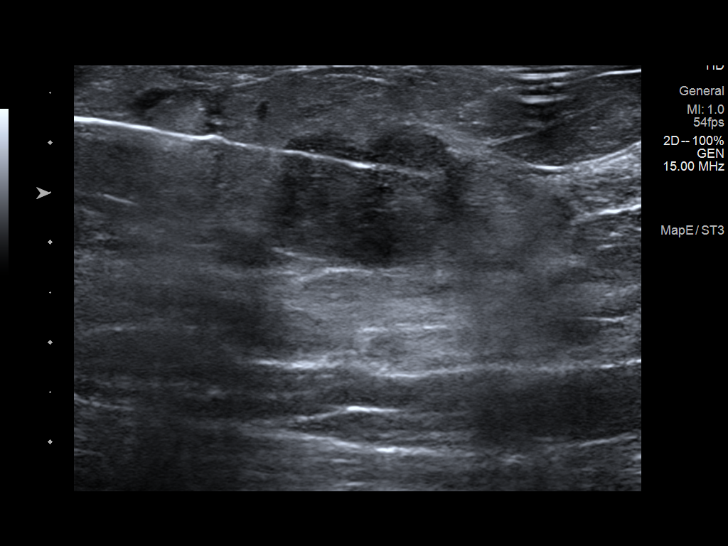
[im 6/13]
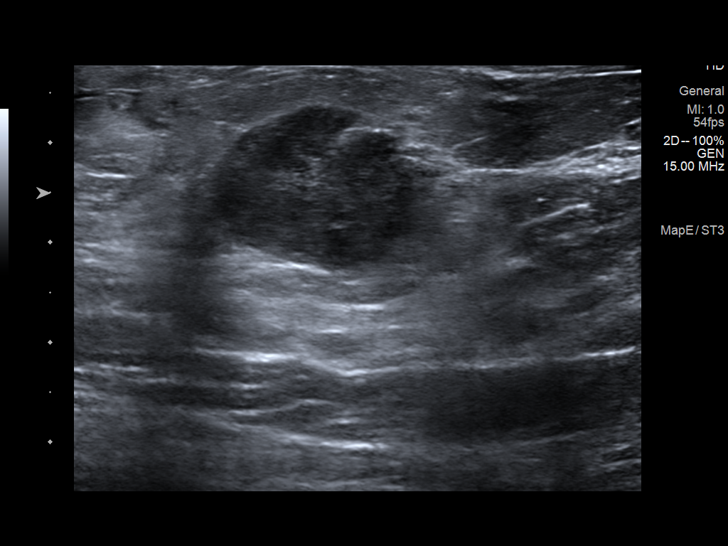
[im 8/13]
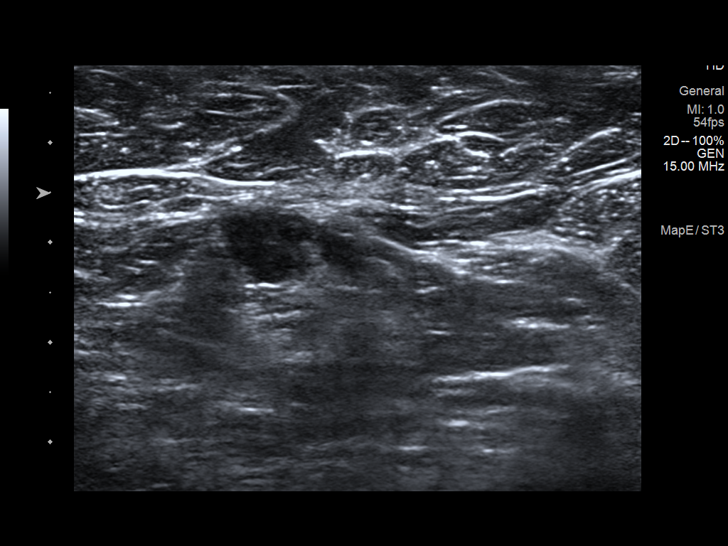
[im 9/13]
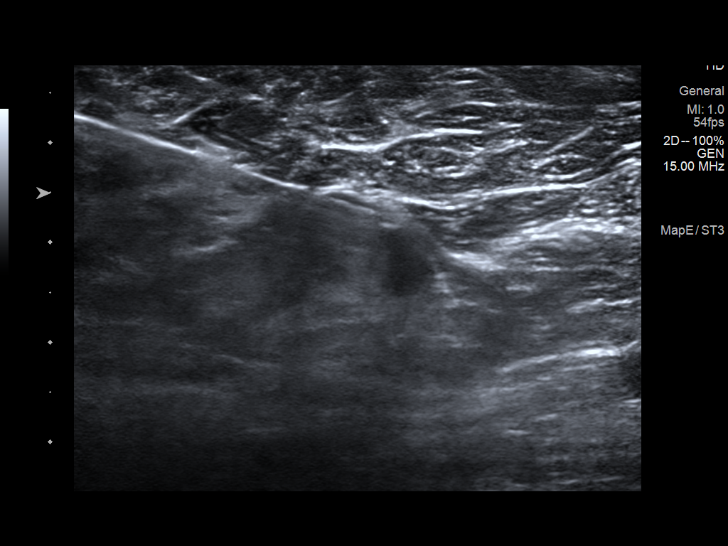
[im 10/13]
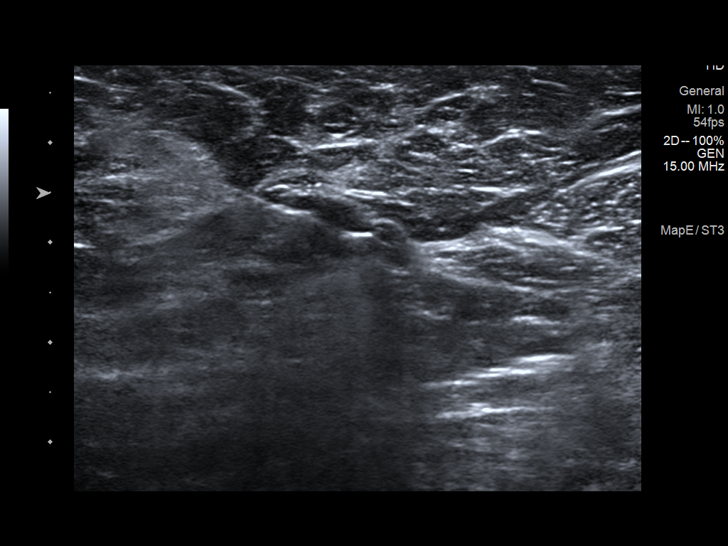
[im 11/13]
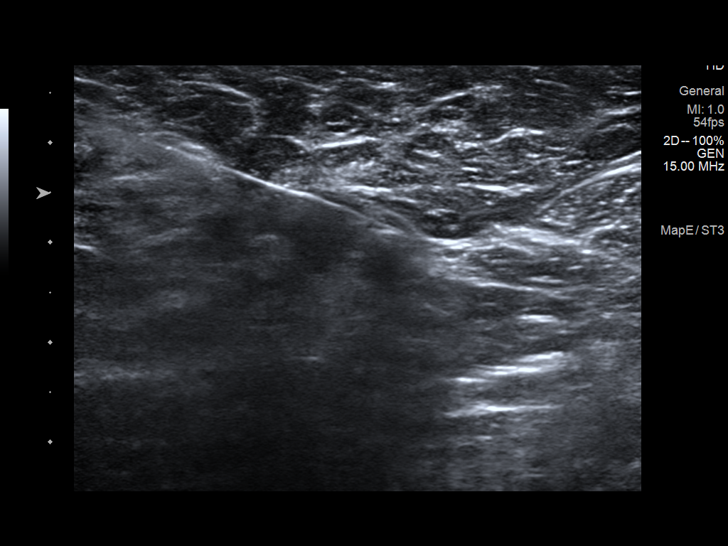
[im 12/13]
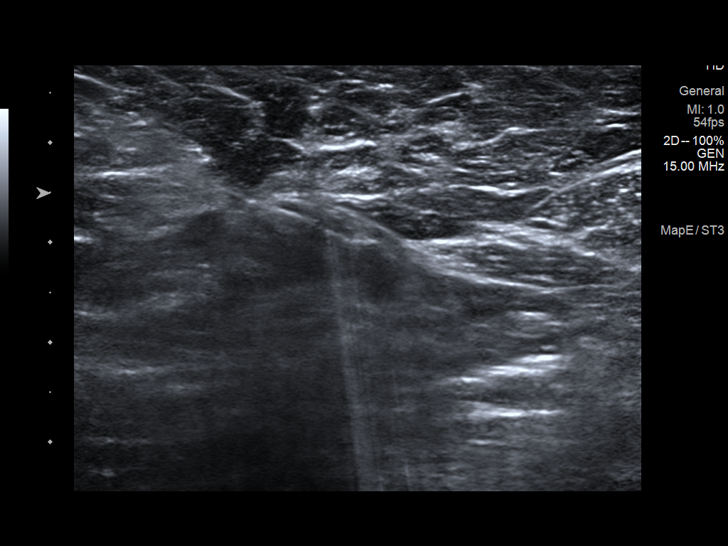
[im 13/13]
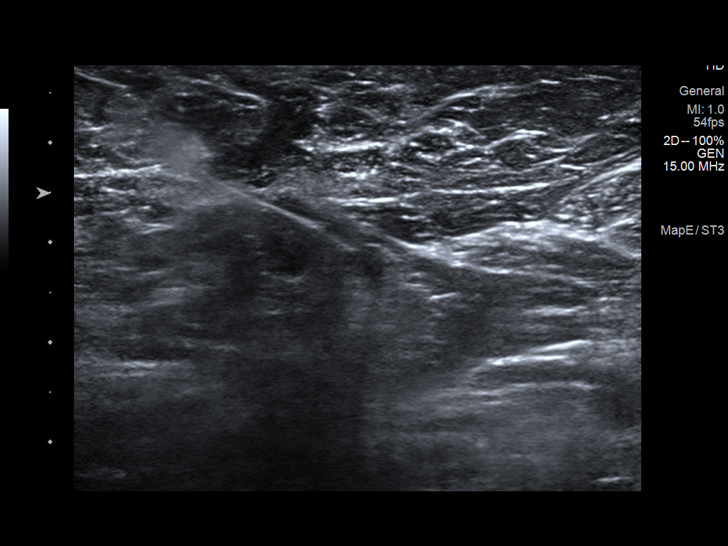

[12 of 13 positions shown; findings below may reference images not displayed]



Site 1: Right breast mass 11:30 o'clock: Ribbon shaped clip

Lesion quadrant: Upper outer quadrant

Using sterile technique and 1% Lidocaine as local anesthetic, under
direct ultrasound visualization, a 14 gauge FONCTIONEUR device was
used to perform biopsy of right breast mass 11:30 o'clock using a
lateral approach. At the conclusion of the procedure ribbon shaped
tissue marker clip was deployed into the biopsy cavity. Follow up 2
view mammogram was performed and dictated separately.

Site 2: Right axillary lymph node: FONCTIONEUR clip

Lesion quadrant: Upper outer quadrant

Using sterile technique and 1% Lidocaine as local anesthetic, under
direct ultrasound visualization, a 14 gauge FONCTIONEUR device was
used to perform biopsy of right axillary lymph node using a lateral
approach. At the conclusion of the procedure FONCTIONEUR tissue marker
clip was deployed into the biopsy cavity. Follow up 2 view mammogram
was performed and dictated separately.
IMPRESSION: Ultrasound guided biopsy of right breast mass and right axillary
lymph node. No apparent complications.

ADDENDUM:
Pathology revealed GRADE [DATE] INVASIVE DUCTAL CARCINOMA, DUCTAL
CARCINOMA IN SITU of the RIGHT breast, 11:30 o'clock, ribbon clip.

This was found to be concordant by Dr. FONCTIONEUR.

Pathology revealed LYMPH NODE TISSUE NEGATIVE FOR METASTATIC
CARCINOMA of the RIGHT axilla, tribell clip. This was found to be
concordant by Dr. FONCTIONEUR.

Pathology results were discussed with the patient by telephone. The
patient reported doing well after the biopsies with tenderness at
the sites. Post biopsy instructions and care were reviewed and
questions were answered. The patient was encouraged to call The

Breast MRI is scheduled at [facility] [REDACTED] [facility]ON[facility]

The patient was referred to [REDACTED]
[REDACTED] at [REDACTED] on
[DATE].

Pathology results reported by FONCTIONEUR RN on [DATE].



Site 1: Right breast mass 11:30 o'clock: Ribbon shaped clip

Lesion quadrant: Upper outer quadrant

Using sterile technique and 1% Lidocaine as local anesthetic, under
direct ultrasound visualization, a 14 gauge FONCTIONEUR device was
used to perform biopsy of right breast mass 11:30 o'clock using a
lateral approach. At the conclusion of the procedure ribbon shaped
tissue marker clip was deployed into the biopsy cavity. Follow up 2
view mammogram was performed and dictated separately.

Site 2: Right axillary lymph node: FONCTIONEUR clip

Lesion quadrant: Upper outer quadrant

Using sterile technique and 1% Lidocaine as local anesthetic, under
direct ultrasound visualization, a 14 gauge FONCTIONEUR device was
used to perform biopsy of right axillary lymph node using a lateral
approach. At the conclusion of the procedure FONCTIONEUR tissue marker
clip was deployed into the biopsy cavity. Follow up 2 view mammogram
was performed and dictated separately.
IMPRESSION: Ultrasound guided biopsy of right breast mass and right axillary
lymph node. No apparent complications.

## 2021-07-14 ENCOUNTER — Encounter: Payer: Self-pay | Admitting: *Deleted

## 2021-07-14 DIAGNOSIS — Z171 Estrogen receptor negative status [ER-]: Secondary | ICD-10-CM | POA: Insufficient documentation

## 2021-07-14 DIAGNOSIS — Z170 Estrogen receptor positive status [ER+]: Secondary | ICD-10-CM

## 2021-07-14 DIAGNOSIS — C50411 Malignant neoplasm of upper-outer quadrant of right female breast: Secondary | ICD-10-CM | POA: Insufficient documentation

## 2021-07-15 NOTE — Progress Notes (Signed)
[place] NOTE  Patient Care Team: Crecencio Mc, MD as PCP - General (Internal Medicine) Mauro Kaufmann, RN as Oncology Nurse Navigator Rockwell Germany, RN as Oncology Nurse Navigator Jovita Kussmaul, MD as Consulting Physician (General Surgery) Nicholas Lose, MD as Consulting Physician (Hematology and Oncology) Eppie Gibson, MD as Attending Physician (Radiation Oncology)  CHIEF COMPLAINTS/PURPOSE OF CONSULTATION:  Newly diagnosed right breast cancer  HISTORY OF PRESENTING ILLNESS:  Katelyn Hobbs 45 y.o. female is here because of recent diagnosis of invasive ductal carcinoma of the right breast. She had a palpable right breast lump. Diagnostic mammogram and [place] on 07/01/2021 showed highly suspicious right breast mass and an indeterminate single right axillary lymph node with up to 5 mm diffuse cortical thickening. Biopsy on 07/08/2021 showed grade 2-3 invasive ductal carcinoma, DCIS, and right axillary lymph node negative for metastatic carcinoma; ER+(80%)/PR-/Her2-. She presents to the clinic today for initial evaluation and discussion of treatment options.   I reviewed her records extensively and collaborated the history with the patient.  SUMMARY OF ONCOLOGIC HISTORY: Oncology History  Malignant neoplasm of upper-outer quadrant of right breast in female, estrogen receptor positive ([place])  07/08/2021 Initial Diagnosis   Palpable right breast lump. Diagnostic mammogram: showed highly suspicious right breast mass and an indeterminate single right axillary lymph node with up to 5 mm diffuse cortical thickening. Biopsy: grade 2-3 invasive ductal carcinoma, DCIS, and right axillary lymph node negative for metastatic carcinoma; ER+(80%)/PR-/Her2-.   07/16/2021 Cancer Staging   Staging form: Breast, AJCC 8th Edition - Clinical stage from 07/16/2021: Stage IIB (cT2, cN0, cM0, G3, ER+, PR-, HER2-) - Signed by Nicholas Lose, MD on 07/16/2021 Stage prefix: Initial  diagnosis Histologic grading system: 3 grade system     MEDICAL HISTORY:  Past Medical History:  Diagnosis Date   Abnormal uterine bleeding    ADHD (attention deficit hyperactivity disorder)    Anemia    Anxiety    Bacterial vaginosis    Depression    Endometriosis    Hyperlipidemia    Migraine    PCOS (polycystic ovarian syndrome)     SURGICAL HISTORY: Past Surgical History:  Procedure Laterality Date   LAPAROSCOPY  1993   SALPINGECTOMY Bilateral 2015   VAGINAL HYSTERECTOMY  2017    SOCIAL HISTORY: Social History   Socioeconomic History   Marital status: Divorced    Spouse name: Not on file   Number of children: 2   Years of education: Not on file   Highest education level: Not on file  Occupational History   Occupation: RN  Tobacco Use   Smoking status: Former   Smokeless tobacco: Never  Scientific laboratory technician Use: Never used  Substance and Sexual Activity   Alcohol use: Yes    Alcohol/week: 0.0 standard drinks    Comment: occas   Drug use: No   Sexual activity: Yes    Birth control/protection: Surgical  Other Topics Concern   Not on file  Social History Narrative   2 adopted children: 48 yo son, 23 yo daughter,    Social Determinants of Radio broadcast assistant Strain: Not on file  Food Insecurity: Not on file  Transportation Needs: Not on file  Physical Activity: Not on file  Stress: Not on file  Social Connections: Not on file  Intimate Partner Violence: Not on file    FAMILY HISTORY: Family History  Problem Relation Age of Onset   Cancer Maternal Grandmother  breast   Heart disease Maternal Grandmother    Breast cancer Maternal Grandmother    Heart disease Paternal Grandmother    Cancer Paternal Grandfather        colon   Diabetes Paternal Grandfather    Bipolar disorder Maternal Grandfather    Diabetes Father    Hyperlipidemia Father    Hypertension Father    Cancer Mother 43       uterine    Cancer Sister    ADD / ADHD  Sister    Diabetes Sister    Hypertension Sister     ALLERGIES:  has No Known Allergies.  MEDICATIONS:  Current Outpatient Medications  Medication Sig Dispense Refill   ALPRAZolam (XANAX XR) 2 MG 24 hr tablet TAKE 1 TABLET BY MOUTH EVERYDAY AT BEDTIME 30 tablet 2   cetirizine (ZYRTEC) 10 MG tablet Take 10 mg by mouth daily.     cholecalciferol (VITAMIN D3) 25 MCG (1000 UNIT) tablet Take 1,000 Units by mouth daily.     citalopram (CELEXA) 40 MG tablet Take 1 tablet (40 mg total) by mouth daily. 90 tablet 1   amoxicillin-clavulanate (AUGMENTIN) 875-125 MG tablet Take 1 tablet by mouth 2 (two) times daily. (Patient not taking: Reported on 07/16/2021) 14 tablet 0   diltiazem (CARDIZEM) 30 MG tablet TAKE 1 TABLET BY MOUTH AS NEEDED FOR TACHYCARDIA (Patient not taking: Reported on 07/16/2021) 90 tablet 1   ergocalciferol (DRISDOL) 1.25 MG (50000 UT) capsule Take 1 capsule (50,000 Units total) by mouth once a week. (Patient not taking: Reported on 07/16/2021) 12 capsule 0   No current facility-administered medications for this visit.    REVIEW OF SYSTEMS:   Constitutional: Denies fevers, chills or abnormal night sweats All other systems were reviewed with the patient and are negative.  PHYSICAL EXAMINATION: ECOG PERFORMANCE STATUS: 1 - Symptomatic but completely ambulatory  Vitals:   07/16/21 1256  BP: 124/70  Pulse: 93  Resp: 18  Temp: 97.8 F (36.6 C)  SpO2: 97%   Filed Weights   07/16/21 1256  Weight: 177 lb 6.4 oz (80.5 kg)    GENERAL:alert, no distress and comfortable SKIN: skin color, texture, turgor are normal, no rashes or significant lesions EYES: normal, conjunctiva are pink and non-injected, sclera clear OROPHARYNX:no exudate, no erythema and lips, buccal mucosa, and tongue normal  NECK: supple, thyroid normal size, non-tender, without nodularity LYMPH:  no palpable lymphadenopathy in the cervical, axillary or inguinal LUNGS: clear to auscultation and percussion with  normal breathing effort HEART: regular rate & rhythm and no murmurs and no lower extremity edema ABDOMEN:abdomen soft, non-tender and normal bowel sounds Musculoskeletal:no cyanosis of digits and no clubbing  PSYCH: alert & oriented x 3 with fluent speech NEURO: no focal motor/sensory deficits BREAST: Large palpable mass in the right breast upper outer quadrant with extensive bruising very tender to palpation.. No palpable axillary or supraclavicular lymphadenopathy (exam performed in the presence of a chaperone)   LABORATORY DATA:  I have reviewed the data as listed Lab Results  Component Value Date   WBC 7.1 07/16/2021   HGB 13.9 07/16/2021   HCT 39.1 07/16/2021   MCV 89.3 07/16/2021   PLT 258 07/16/2021   Lab Results  Component Value Date   NA 139 07/16/2021   K 3.7 07/16/2021   CL 102 07/16/2021   CO2 27 07/16/2021    RADIOGRAPHIC STUDIES: I have personally reviewed the radiological reports and agreed with the findings in the report.  ASSESSMENT AND PLAN:  Malignant neoplasm of upper-outer quadrant of right breast in female, estrogen receptor positive ([place]) 07/08/2021: Palpable right breast lump. Diagnostic mammogram: showed highly suspicious right breast mass and an indeterminate single right axillary lymph node with up to 5 mm diffuse cortical thickening. Biopsy: grade 2-3 invasive ductal carcinoma, DCIS, and right axillary lymph node negative for metastatic carcinoma; ER+(80%)/PR-/Her2-.  Pathology and radiology counseling:Discussed with the patient, the details of pathology including the type of breast cancer,the clinical staging, the significance of ER, PR and HER-2/neu receptors and the implications for treatment. After reviewing the pathology in detail, we proceeded to discuss the different treatment options between surgery, radiation, chemotherapy, antiestrogen therapies.  Recommendations: 1. Breast conserving surgery followed by 2. Oncotype DX testing to determine if  chemotherapy would be of any benefit followed by 3. Adjuvant radiation therapy followed by 4. Adjuvant antiestrogen therapy 5.  Genetic testing  Oncotype counseling: I discussed Oncotype DX test. I explained to the patient that this is a 21 gene panel to evaluate patient tumors DNA to calculate recurrence score. This would help determine whether patient has high risk or low risk breast cancer. She understands that if her tumor was found to be high risk, she would benefit from systemic chemotherapy. If low risk, no need of chemotherapy.  Return to clinic after surgery to discuss final pathology report and then determine if Oncotype DX testing will need to be sent.    All questions were answered. The patient knows to call the clinic with any problems, questions or concerns.   Rulon Eisenmenger, MD, MPH 07/16/2021    I, Thana Ates, am acting as scribe for Nicholas Lose, MD.  I have reviewed the above documentation for accuracy and completeness, and I agree with the above.

## 2021-07-15 NOTE — Progress Notes (Signed)
Radiation Oncology         [phone] ________________________________  Initial Outpatient Consultation  Name: Katelyn Hobbs MRN: [id number]  Date: 07/16/2021  DOB: July 17, 1976  XT:KWIOX, Aris Everts, MD  Jovita Kussmaul, MD   REFERRING PHYSICIAN: [name], MD  DIAGNOSIS:    ICD-10-CM   1. Malignant neoplasm of upper-outer quadrant of right breast in female, estrogen receptor positive ([place])  C50.411    Z17.0      Cancer Staging Malignant neoplasm of upper-outer quadrant of right breast in female, estrogen receptor positive ([place]) Staging form: Breast, AJCC 8th Edition - Clinical stage from 07/16/2021: Stage IIB (cT2, cN0, cM0, G3, ER+, PR-, HER2-) - Signed by Nicholas Lose, MD on 07/16/2021 Stage prefix: Initial diagnosis Histologic grading system: 3 grade system   Stage IIB (cT2, cN0, cM0) Right Breast UOQ Invasive and in-situ Ductal Carcinoma, ER+ / PR- / Her2-, Grade 3  CHIEF COMPLAINT: Here to discuss management of right breast cancer   HISTORY OF PRESENT ILLNESS::Katelyn Hobbs is a 45 y.o. female who presented with left breast abnormality on the following imaging: bilateral screening mammogram on the date of 03/11/21. Diagnostic left breast mammogram performed on 03/31/21 revealed likely benign loosely grouped calcifications involving the upper-outer quadrant of the left breast at a posterior depth. Since findings appeared benign at the time, the patient was recommended for follow-up imaging in 6 months.   However, the patient developed a palpable right breast lump warranting sooner evaluation. Right breast diagnostic mammogram and ultrasound performed on 07/01/21 revealed a highly suspicious right breast mass, 2.6 cm , corresponding with the patients palpable lump, as well as an indeterminate single right axillary lymph node with up to 5 mm of diffuse cortical thickening.   Right breast biopsy at the 11:30 o'clock position on date of 07/08/21 showed grade 3 invasive  ductal carcinoma with ductal carcinoma in-situ; carcinoma measuring 0.9 cm in the greatest dimension. Right axillary lymph node also biopsied at this time was negative for metastatic carcinoma.  ER status: 80%, positive, with weak staining intensity ; PR status negative, Her2 status negative; Ki67 60%; Grade 2.  She has met with her surgeon and is enthusiastic about bilateral mastectomy without reconstruction.  She is a Marine scientist in dermatology at [employer]: No  PAST MEDICAL HISTORY:  has a past medical history of Abnormal uterine bleeding, ADHD (attention deficit hyperactivity disorder), Anemia, Anxiety, Bacterial vaginosis, Depression, Endometriosis, Hyperlipidemia, Migraine, and PCOS (polycystic ovarian syndrome).    PAST SURGICAL HISTORY: Past Surgical History:  Procedure Laterality Date   LAPAROSCOPY  1993   SALPINGECTOMY Bilateral 2015   VAGINAL HYSTERECTOMY  2017    FAMILY HISTORY: family history includes ADD / ADHD in her sister; Bipolar disorder in her maternal grandfather; Breast cancer in her maternal grandmother; Cancer in her maternal grandmother, paternal grandfather, and sister; Cancer (age of onset: 57) in her mother; Diabetes in her father, paternal grandfather, and sister; Heart disease in her maternal grandmother and paternal grandmother; Hyperlipidemia in her father; Hypertension in her father and sister.  SOCIAL HISTORY:  reports that she has quit smoking. She has never used smokeless tobacco. She reports current alcohol use. She reports that she does not use drugs.  ALLERGIES: Patient has no known allergies.  MEDICATIONS:  Current Outpatient Medications  Medication Sig Dispense Refill   ALPRAZolam (XANAX XR) 2 MG 24 hr tablet TAKE 1 TABLET BY MOUTH EVERYDAY AT BEDTIME 30 tablet 2   amoxicillin-clavulanate (AUGMENTIN) 875-125  MG tablet Take 1 tablet by mouth 2 (two) times daily. (Patient not taking: Reported on 07/16/2021) 14 tablet 0    cetirizine (ZYRTEC) 10 MG tablet Take 10 mg by mouth daily.     cholecalciferol (VITAMIN D3) 25 MCG (1000 UNIT) tablet Take 1,000 Units by mouth daily.     citalopram (CELEXA) 40 MG tablet Take 1 tablet (40 mg total) by mouth daily. 90 tablet 1   diltiazem (CARDIZEM) 30 MG tablet TAKE 1 TABLET BY MOUTH AS NEEDED FOR TACHYCARDIA (Patient not taking: Reported on 07/16/2021) 90 tablet 1   ergocalciferol (DRISDOL) 1.25 MG (50000 UT) capsule Take 1 capsule (50,000 Units total) by mouth once a week. (Patient not taking: Reported on 07/16/2021) 12 capsule 0   No current facility-administered medications for this encounter.    REVIEW OF SYSTEMS: As above   PHYSICAL EXAM:  vitals were not taken for this visit.   General: Alert and oriented, in no acute distress Neck: Neck is supple, no palpable cervical or supraclavicular lymphadenopathy. Heart: Regular in rate and rhythm with no murmurs, rubs, or gallops. Chest: Clear to auscultation bilaterally, with no rhonchi, wheezes, or rales. Lymphatics: see Neck Exam Skin: No concerning lesions. Musculoskeletal: symmetric strength and muscle tone throughout. Neurologic:  No obvious focalities. Speech is fluent. Coordination is intact. Psychiatric: Judgment and insight are intact. Affect is appropriate. Breasts: Approximately 4 cm mass in the upper outer quadrant of right breast. No other palpable masses appreciated in the breasts or axillae bilaterally.    ECOG = 0  0 - Asymptomatic (Fully active, able to carry on all predisease activities without restriction)  1 - Symptomatic but completely ambulatory (Restricted in physically strenuous activity but ambulatory and able to carry out work of a light or sedentary nature. For example, light housework, office work)  2 - Symptomatic, <50% in bed during the day (Ambulatory and capable of all self care but unable to carry out any work activities. Up and about more than 50% of waking hours)  3 - Symptomatic,  >50% in bed, but not bedbound (Capable of only limited self-care, confined to bed or chair 50% or more of waking hours)  4 - Bedbound (Completely disabled. Cannot carry on any self-care. Totally confined to bed or chair)  5 - Death   Eustace Pen MM, Creech RH, Tormey DC, et al. [id number]). "Toxicity and response criteria of the [facility]". [place] Oncol. 5 (6): 649-55   LABORATORY DATA:  Lab Results  Component Value Date   WBC 7.1 07/16/2021   HGB 13.9 07/16/2021   HCT 39.1 07/16/2021   MCV 89.3 07/16/2021   PLT 258 07/16/2021   CMP     Component Value Date/Time   NA 139 07/16/2021 1219   NA 141 05/17/2018 1529   NA 138 10/26/2014 1736   K 3.7 07/16/2021 1219   K 3.3 (L) 10/26/2014 1736   CL 102 07/16/2021 1219   CL 103 10/26/2014 1736   CO2 27 07/16/2021 1219   CO2 30 10/26/2014 1736   GLUCOSE 104 (H) 07/16/2021 1219   GLUCOSE 123 (H) 10/26/2014 1736   BUN 9 07/16/2021 1219   BUN 7 05/17/2018 1529   BUN 2 (L) 10/26/2014 1736   CREATININE 0.78 07/16/2021 1219   CREATININE 0.80 10/26/2014 1736   CALCIUM 9.6 07/16/2021 1219   CALCIUM 8.9 10/26/2014 1736   PROT 7.8 07/16/2021 1219   PROT 7.5 05/17/2018 1529   PROT 7.5 10/26/2014 1736   ALBUMIN  4.4 07/16/2021 1219   ALBUMIN 4.6 05/17/2018 1529   ALBUMIN 3.4 10/26/2014 1736   AST 14 (L) 07/16/2021 1219   ALT 14 07/16/2021 1219   ALT 37 10/26/2014 1736   ALKPHOS 55 07/16/2021 1219   ALKPHOS 79 10/26/2014 1736   BILITOT 0.5 07/16/2021 1219   GFRNONAA >60 07/16/2021 1219   GFRNONAA >60 10/26/2014 1736   GFRAA 117 05/17/2018 1529   GFRAA >60 10/26/2014 1736         RADIOGRAPHY: US BREAST LTD UNI RIGHT INC AXILLA  Result Date: 07/01/2021 CLINICAL DATA:  45 year old female with a palpable right breast lump. EXAM: DIGITAL DIAGNOSTIC UNILATERAL RIGHT MAMMOGRAM WITH TOMOSYNTHESIS AND CAD; ULTRASOUND RIGHT BREAST LIMITED TECHNIQUE: Right digital diagnostic mammography and breast tomosynthesis was  performed. The images were evaluated with computer-aided detection.; Targeted ultrasound examination of the right breast was performed COMPARISON:  Previous exam(s). ACR Breast Density Category b: There are scattered areas of fibroglandular density. FINDINGS: A radiopaque BB was placed at the site of the patient's palpable lump in the upper-outer right breast. An irregular, hyperdense mass is seen deep to the radiopaque BB. No additional suspicious findings in the remainder of the right breast. Further evaluation with ultrasound was performed. Targeted ultrasound is performed, showing an irregular, hypoechoic mass with associated vascularity. It measures 2.6 x 1.8 x 2.3 cm. This correlates well with the mammographic finding. Evaluation of the right axilla demonstrates a single lymph node with diffuse cortical thickening up to 5 mm. IMPRESSION: 1. Highly suspicious right breast mass corresponding with the patient's palpable lump. Recommendation is for ultrasound-guided biopsy. 2. Indeterminate single right axillary lymph node with up to 5 mm diffuse cortical thickening. Recommendation is for ultrasound-guided biopsy. RECOMMENDATION: 1. Two area ultrasound-guided biopsy of the right breast and right axilla. 2. Pending biopsy results, further evaluation of the patient's previously evaluated left breast calcifications (diagnostic study 03/31/2021) is recommended. 3. Additionally, consider further evaluation with contrast enhanced breast MRI. I have discussed the findings and recommendations with the patient. If applicable, a reminder letter will be sent to the patient regarding the next appointment. BI-RADS CATEGORY  4: Suspicious. Electronically Signed   By: Kristopher Oppenheim M.D.   On: 07/01/2021 14:08  MM DIAG BREAST TOMO UNI RIGHT  Result Date: 07/01/2021 CLINICAL DATA:  45 year old female with a palpable right breast lump. EXAM: DIGITAL DIAGNOSTIC UNILATERAL RIGHT MAMMOGRAM WITH TOMOSYNTHESIS AND CAD; ULTRASOUND  RIGHT BREAST LIMITED TECHNIQUE: Right digital diagnostic mammography and breast tomosynthesis was performed. The images were evaluated with computer-aided detection.; Targeted ultrasound examination of the right breast was performed COMPARISON:  Previous exam(s). ACR Breast Density Category b: There are scattered areas of fibroglandular density. FINDINGS: A radiopaque BB was placed at the site of the patient's palpable lump in the upper-outer right breast. An irregular, hyperdense mass is seen deep to the radiopaque BB. No additional suspicious findings in the remainder of the right breast. Further evaluation with ultrasound was performed. Targeted ultrasound is performed, showing an irregular, hypoechoic mass with associated vascularity. It measures 2.6 x 1.8 x 2.3 cm. This correlates well with the mammographic finding. Evaluation of the right axilla demonstrates a single lymph node with diffuse cortical thickening up to 5 mm. IMPRESSION: 1. Highly suspicious right breast mass corresponding with the patient's palpable lump. Recommendation is for ultrasound-guided biopsy. 2. Indeterminate single right axillary lymph node with up to 5 mm diffuse cortical thickening. Recommendation is for ultrasound-guided biopsy. RECOMMENDATION: 1. Two area ultrasound-guided biopsy of the right breast and  right axilla. 2. Pending biopsy results, further evaluation of the patient's previously evaluated left breast calcifications (diagnostic study 03/31/2021) is recommended. 3. Additionally, consider further evaluation with contrast enhanced breast MRI. I have discussed the findings and recommendations with the patient. If applicable, a reminder letter will be sent to the patient regarding the next appointment. BI-RADS CATEGORY  4: Suspicious. Electronically Signed   By: Kristopher Oppenheim M.D.   On: 07/01/2021 14:08  [place] AXILLARY NODE CORE BIOPSY RIGHT  Addendum Date: 07/09/2021   ADDENDUM REPORT: 07/09/2021 11:11 ADDENDUM: Pathology  revealed GRADE 2/3 INVASIVE DUCTAL CARCINOMA, DUCTAL CARCINOMA IN SITU of the RIGHT breast, 11:30 o'clock, ribbon clip. This was found to be concordant by Dr. Lovey Newcomer. Pathology revealed LYMPH NODE TISSUE NEGATIVE FOR METASTATIC CARCINOMA of the RIGHT axilla, tribell clip. This was found to be concordant by Dr. Lovey Newcomer. Pathology results were discussed with the patient by telephone. The patient reported doing well after the biopsies with tenderness at the sites. Post biopsy instructions and care were reviewed and questions were answered. The patient was encouraged to call [facility] for any additional concerns. Breast MRI is scheduled at [facility] July 19, 2021 @ 340 pm. The patient was referred to [facility] on July 16, 2021. Pathology results reported by Stacie Acres RN on 07/09/2021. Electronically Signed   By: Lovey Newcomer M.D.   On: 07/09/2021 11:11   Result Date: 07/09/2021 CLINICAL DATA:  Patient with indeterminate right breast mass and right axillary lymph node. EXAM: ULTRASOUND GUIDED RIGHT BREAST CORE NEEDLE BIOPSY COMPARISON:  Previous exam(s). PROCEDURE: I met with the patient and we discussed the procedure of ultrasound-guided biopsy, including benefits and alternatives. We discussed the high likelihood of a successful procedure. We discussed the risks of the procedure, including infection, bleeding, tissue injury, clip migration, and inadequate sampling. Informed written consent was given. The usual time-out protocol was performed immediately prior to the procedure. Site 1: Right breast mass 11:30 o'clock: Ribbon shaped clip Lesion quadrant: Upper outer quadrant Using sterile technique and 1% Lidocaine as local anesthetic, under direct ultrasound visualization, a 14 gauge spring-loaded device was used to perform biopsy of right breast mass 11:30 o'clock using a  lateral approach. At the conclusion of the procedure ribbon shaped tissue marker clip was deployed into the biopsy cavity. Follow up 2 view mammogram was performed and dictated separately. Site 2: Right axillary lymph node: Tri bell clip Lesion quadrant: Upper outer quadrant Using sterile technique and 1% Lidocaine as local anesthetic, under direct ultrasound visualization, a 14 gauge spring-loaded device was used to perform biopsy of right axillary lymph node using a lateral approach. At the conclusion of the procedure tri bell tissue marker clip was deployed into the biopsy cavity. Follow up 2 view mammogram was performed and dictated separately. IMPRESSION: Ultrasound guided biopsy of right breast mass and right axillary lymph node. No apparent complications. Electronically Signed: By: Lovey Newcomer M.D. On: 07/08/2021 14:52  MM CLIP PLACEMENT RIGHT  Result Date: 07/08/2021 CLINICAL DATA:  Patient status post ultrasound-guided biopsy right breast mass and right axillary lymph node EXAM: 3D DIAGNOSTIC RIGHT MAMMOGRAM POST ULTRASOUND BIOPSY COMPARISON:  Previous exam(s). FINDINGS: 3D Mammographic images were obtained following ultrasound guided biopsy of right breast mass and right axillary lymph node. Site 1: Right breast mass 11:30 o'clock: Ribbon shaped clip: In appropriate position. Site 2: Right axillary lymph node: Tri bell clip: In  appropriate position. IMPRESSION: Appropriate positioning of the biopsy marking clips as above Final Assessment: Post Procedure Mammograms for Marker Placement Electronically Signed   By: Lovey Newcomer M.D.   On: 07/08/2021 14:54  [place] RT BREAST BX W LOC DEV 1ST LESION IMG BX SPEC US GUIDE  Addendum Date: 07/09/2021   ADDENDUM REPORT: 07/09/2021 11:11 ADDENDUM: Pathology revealed GRADE 2/3 INVASIVE DUCTAL CARCINOMA, DUCTAL CARCINOMA IN SITU of the RIGHT breast, 11:30 o'clock, ribbon clip. This was found to be concordant by Dr. Lovey Newcomer. Pathology revealed LYMPH NODE TISSUE  NEGATIVE FOR METASTATIC CARCINOMA of the RIGHT axilla, tribell clip. This was found to be concordant by Dr. Lovey Newcomer. Pathology results were discussed with the patient by telephone. The patient reported doing well after the biopsies with tenderness at the sites. Post biopsy instructions and care were reviewed and questions were answered. The patient was encouraged to call [facility] for any additional concerns. Breast MRI is scheduled at [facility] July 19, 2021 @ 340 pm. The patient was referred to [facility] on July 16, 2021. Pathology results reported by Stacie Acres RN on 07/09/2021. Electronically Signed   By: Lovey Newcomer M.D.   On: 07/09/2021 11:11   Result Date: 07/09/2021 CLINICAL DATA:  Patient with indeterminate right breast mass and right axillary lymph node. EXAM: ULTRASOUND GUIDED RIGHT BREAST CORE NEEDLE BIOPSY COMPARISON:  Previous exam(s). PROCEDURE: I met with the patient and we discussed the procedure of ultrasound-guided biopsy, including benefits and alternatives. We discussed the high likelihood of a successful procedure. We discussed the risks of the procedure, including infection, bleeding, tissue injury, clip migration, and inadequate sampling. Informed written consent was given. The usual time-out protocol was performed immediately prior to the procedure. Site 1: Right breast mass 11:30 o'clock: Ribbon shaped clip Lesion quadrant: Upper outer quadrant Using sterile technique and 1% Lidocaine as local anesthetic, under direct ultrasound visualization, a 14 gauge spring-loaded device was used to perform biopsy of right breast mass 11:30 o'clock using a lateral approach. At the conclusion of the procedure ribbon shaped tissue marker clip was deployed into the biopsy cavity. Follow up 2 view mammogram was performed and dictated separately. Site 2: Right axillary  lymph node: Tri bell clip Lesion quadrant: Upper outer quadrant Using sterile technique and 1% Lidocaine as local anesthetic, under direct ultrasound visualization, a 14 gauge spring-loaded device was used to perform biopsy of right axillary lymph node using a lateral approach. At the conclusion of the procedure tri bell tissue marker clip was deployed into the biopsy cavity. Follow up 2 view mammogram was performed and dictated separately. IMPRESSION: Ultrasound guided biopsy of right breast mass and right axillary lymph node. No apparent complications. Electronically Signed: By: Lovey Newcomer M.D. On: 07/08/2021 14:52     IMPRESSION/PLAN: This is a very nice 45 year old woman with a new history of right breast cancer, weakly ER positive, PR negative, HER2 negative.  She is electing for bilateral mastectomies.  She will also meet with our genetics counselor.  Her disposition regarding chemotherapy is pending Oncotype testing.  We discussed the role of radiation therapy postmastectomy.  Indications for postmastectomy radiation include positive lymph nodes, positive margins, large tumor (ie 5 cm).  For patients with triple negative breast cancer, particularly if they are of young age, local regional control can be improved with postmastectomy radiation even if the tumor is less than 5 cm in size.  Her disease appears to be histologically aggressive though the ER is technically positive with weak staining.  Prognostic receptors will be ordered on her final path for review.   We will review her pathologic data after surgery to determine if radiation should be considered.  It was a pleasure meeting the patient today. We discussed the risks, benefits, and side effects of radiotherapy.   We discussed that radiation, if given, would take approximately 5-6 weeks to complete . We spoke about acute effects including skin irritation and fatigue as well as much less common late effects including internal organ injury or  irritation.  No guarantees of treatment were given. The patient is enthusiastic about proceeding with treatment. I look forward to participating in the patient's care if warranted.   On date of service, in total, I spent 45 minutes on this encounter. Patient was seen in person.   __________________________________________   Eppie Gibson, MD  This document serves as a record of services personally performed by Eppie Gibson, MD. It was created on her behalf by Roney Mans, a trained medical scribe. The creation of this record is based on the scribe's personal observations and the provider's statements to them. This document has been checked and approved by the attending provider.

## 2021-07-16 ENCOUNTER — Encounter: Payer: Self-pay | Admitting: Physical Therapy

## 2021-07-16 ENCOUNTER — Inpatient Hospital Stay: Payer: BC Managed Care – PPO | Admitting: Hematology and Oncology

## 2021-07-16 ENCOUNTER — Ambulatory Visit: Payer: BC Managed Care – PPO | Attending: General Surgery | Admitting: Physical Therapy

## 2021-07-16 ENCOUNTER — Ambulatory Visit: Payer: BC Managed Care – PPO | Admitting: Genetic Counselor

## 2021-07-16 ENCOUNTER — Encounter: Payer: Self-pay | Admitting: General Practice

## 2021-07-16 ENCOUNTER — Encounter: Payer: Self-pay | Admitting: *Deleted

## 2021-07-16 ENCOUNTER — Inpatient Hospital Stay: Payer: BC Managed Care – PPO | Attending: Hematology and Oncology

## 2021-07-16 ENCOUNTER — Encounter: Payer: Self-pay | Admitting: Radiation Oncology

## 2021-07-16 ENCOUNTER — Other Ambulatory Visit: Payer: Self-pay

## 2021-07-16 ENCOUNTER — Ambulatory Visit: Payer: Self-pay | Admitting: General Surgery

## 2021-07-16 ENCOUNTER — Ambulatory Visit
Admission: RE | Admit: 2021-07-16 | Discharge: 2021-07-16 | Disposition: A | Discharge status: Home / Self Care | Payer: BC Managed Care – PPO | Source: Ambulatory Visit | Attending: Radiation Oncology | Admitting: Radiation Oncology

## 2021-07-16 DIAGNOSIS — Z79899 Other long term (current) drug therapy: Secondary | ICD-10-CM | POA: Insufficient documentation

## 2021-07-16 DIAGNOSIS — Z803 Family history of malignant neoplasm of breast: Secondary | ICD-10-CM | POA: Diagnosis not present

## 2021-07-16 DIAGNOSIS — C50411 Malignant neoplasm of upper-outer quadrant of right female breast: Secondary | ICD-10-CM | POA: Insufficient documentation

## 2021-07-16 DIAGNOSIS — E785 Hyperlipidemia, unspecified: Secondary | ICD-10-CM | POA: Diagnosis not present

## 2021-07-16 DIAGNOSIS — Z8049 Family history of malignant neoplasm of other genital organs: Secondary | ICD-10-CM | POA: Diagnosis not present

## 2021-07-16 DIAGNOSIS — Z170 Estrogen receptor positive status [ER+]: Secondary | ICD-10-CM | POA: Insufficient documentation

## 2021-07-16 DIAGNOSIS — Z9221 Personal history of antineoplastic chemotherapy: Secondary | ICD-10-CM | POA: Insufficient documentation

## 2021-07-16 DIAGNOSIS — R293 Abnormal posture: Secondary | ICD-10-CM | POA: Insufficient documentation

## 2021-07-16 DIAGNOSIS — F909 Attention-deficit hyperactivity disorder, unspecified type: Secondary | ICD-10-CM | POA: Insufficient documentation

## 2021-07-16 DIAGNOSIS — Z8042 Family history of malignant neoplasm of prostate: Secondary | ICD-10-CM | POA: Diagnosis not present

## 2021-07-16 DIAGNOSIS — F329 Major depressive disorder, single episode, unspecified: Secondary | ICD-10-CM | POA: Diagnosis not present

## 2021-07-16 DIAGNOSIS — Z923 Personal history of irradiation: Secondary | ICD-10-CM | POA: Insufficient documentation

## 2021-07-16 DIAGNOSIS — Z808 Family history of malignant neoplasm of other organs or systems: Secondary | ICD-10-CM

## 2021-07-16 LAB — CMP (CANCER CENTER ONLY)
ALT: 14 U/L (ref 0–44)
AST: 14 U/L — ABNORMAL LOW (ref 15–41)
Albumin: 4.4 g/dL (ref 3.5–5.0)
Alkaline Phosphatase: 55 U/L (ref 38–126)
Anion gap: 10 (ref 5–15)
BUN: 9 mg/dL (ref 6–20)
CO2: 27 mmol/L (ref 22–32)
Calcium: 9.6 mg/dL (ref 8.9–10.3)
Chloride: 102 mmol/L (ref 98–111)
Creatinine: 0.78 mg/dL (ref 0.44–1.00)
GFR, Estimated: >60 mL/min (ref >60)
Glucose, Bld: 104 mg/dL — ABNORMAL HIGH (ref 70–99)
Potassium: 3.7 mmol/L (ref 3.5–5.1)
Sodium: 139 mmol/L (ref 135–145)
Total Bilirubin: 0.5 mg/dL (ref 0.3–1.2)
Total Protein: 7.8 g/dL (ref 6.5–8.1)

## 2021-07-16 LAB — CBC WITH DIFFERENTIAL (CANCER CENTER ONLY)
Abs Immature Granulocytes: 0.02 10*3/uL (ref 0.00–0.07)
Basophils Absolute: 0.1 10*3/uL (ref 0.0–0.1)
Basophils Relative: 1 %
Eosinophils Absolute: 0.2 10*3/uL (ref 0.0–0.5)
Eosinophils Relative: 3 %
HCT: 39.1 % (ref 36.0–46.0)
Hemoglobin: 13.9 g/dL (ref 12.0–15.0)
Immature Granulocytes: 0 %
Lymphocytes Relative: 32 %
Lymphs Abs: 2.3 10*3/uL (ref 0.7–4.0)
MCH: 31.7 pg (ref 26.0–34.0)
MCHC: 35.5 g/dL (ref 30.0–36.0)
MCV: 89.3 fL (ref 80.0–100.0)
Monocytes Absolute: 0.6 10*3/uL (ref 0.1–1.0)
Monocytes Relative: 9 %
Neutro Abs: 3.9 10*3/uL (ref 1.7–7.7)
Neutrophils Relative %: 55 %
Platelet Count: 258 10*3/uL (ref 150–400)
RBC: 4.38 MIL/uL (ref 3.87–5.11)
RDW: 12.4 % (ref 11.5–15.5)
WBC Count: 7.1 10*3/uL (ref 4.0–10.5)
nRBC: 0 % (ref 0.0–0.2)

## 2021-07-16 LAB — GENETIC SCREENING ORDER

## 2021-07-16 NOTE — Progress Notes (Signed)
[facility] Psychosocial Distress Screening Spiritual Care  Met with Katelyn Hobbs, who goes by "Katelyn Hobbs," and her SO and mother in [facility] to introduce [facility] team/resources, reviewing distress screen per protocol.  The patient scored a  [unspecified]  on the Psychosocial Distress Thermometer which indicates  [unspecified]  distress. Also assessed for distress and other psychosocial needs.   ONCBCN DISTRESS SCREENING 07/16/2021  Screening Type Initial Screening  Referral to support programs Yes   Katelyn Hobbs reports very little distress, strong faith, confidence in her team, and good support from a big network. She welcomes follow-up support as she is still learning what her complete treatment plan will look like.   Follow up needed: Yes.  We plan to follow up by phone in ca two weeks.   [place], [place], [facility] Pager [phone] Voicemail [phone]

## 2021-07-16 NOTE — Assessment & Plan Note (Signed)
07/08/2021: Palpable right breast lump. Diagnostic mammogram: showed highly suspicious right breast mass and an indeterminate single right axillary lymph node with up to 5 mm diffuse cortical thickening. Biopsy: grade 2-3 invasive ductal carcinoma, DCIS, and right axillary lymph node negative for metastatic carcinoma; ER+(80%)/PR-/Her2-.  Pathology and radiology counseling:Discussed with the patient, the details of pathology including the type of breast cancer,the clinical staging, the significance of ER, PR and HER-2/neu receptors and the implications for treatment. After reviewing the pathology in detail, we proceeded to discuss the different treatment options between surgery, radiation, chemotherapy, antiestrogen therapies.  Recommendations: 1. Breast conserving surgery followed by 2. Oncotype DX testing to determine if chemotherapy would be of any benefit followed by 3. Adjuvant radiation therapy followed by 4. Adjuvant antiestrogen therapy 5.  Genetic testing  Oncotype counseling: I discussed Oncotype DX test. I explained to the patient that this is a 21 gene panel to evaluate patient tumors DNA to calculate recurrence score. This would help determine whether patient has high risk or low risk breast cancer. She understands that if her tumor was found to be high risk, she would benefit from systemic chemotherapy. If low risk, no need of chemotherapy.  Return to clinic after surgery to discuss final pathology report and then determine if Oncotype DX testing will need to be sent.

## 2021-07-16 NOTE — Therapy (Signed)
[place], [place], [address] Phone: [phone]   Fax:  [phone]  Physical Therapy Evaluation  Patient Details  Name: Katelyn Hobbs MRN: [id number] Date of Birth: 1975-12-25 Referring Provider (PT): Dr. Autumn Messing   Encounter Date: 07/16/2021   PT End of Session - 07/16/21 1913     Number of Visits 2    Date for PT Re-Evaluation 09/10/21    PT Start Time 1500    PT Stop Time 1532    PT Time Calculation (min) 32 min    Activity Tolerance Patient tolerated treatment well    Behavior During Therapy [facility] for tasks assessed/performed             Past Medical History:  Diagnosis Date   Abnormal uterine bleeding    ADHD (attention deficit hyperactivity disorder)    Anemia    Anxiety    Bacterial vaginosis    Depression    Endometriosis    Hyperlipidemia    Migraine    PCOS (polycystic ovarian syndrome)     Past Surgical History:  Procedure Laterality Date   LAPAROSCOPY  1993   SALPINGECTOMY Bilateral 2015   VAGINAL HYSTERECTOMY  2017    There were no vitals filed for this visit.    Subjective Assessment - 07/16/21 1906     Subjective Patient reports she is here today to be seen by her medical team for her newly diagnosed right breast cancer.    Patient is accompained by: Family member    Pertinent History Patient was diagnosed on 07/01/2021 with right grade III invasive ductal carcinoma breast cancer. It measures 2.6 cm and is located in the upper outer quadrant. It is ER positive, PR negative, and HER2 negative with a Ki67 of 60%.    Patient Stated Goals Reduce lymphedema risk and learn post op shoulder ROM HEP    Currently in Pain? No/denies                [facility] PT Assessment - 07/16/21 0001       Assessment   Medical Diagnosis Right breast cancer    Referring Provider (PT) Dr. Autumn Messing    Onset Date/Surgical Date 07/01/21    Hand Dominance Right    Prior Therapy  none      Precautions   Precautions Other (comment)    Precaution Comments active cancer      Restrictions   Weight Bearing Restrictions No      Balance Screen   Has the patient fallen in the past 6 months No    Has the patient had a decrease in activity level because of a fear of falling?  No    Is the patient reluctant to leave their home because of a fear of falling?  No      Home Environment   Living Environment Private residence    Living Arrangements Spouse/significant other;Children   Boyfriend and 51 y.o. 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[employer] She does not exercise      Cognition   Overall Cognitive Status Within Functional Limits for tasks assessed      Posture/Postural Control   Posture/Postural Control Postural limitations    Postural Limitations Rounded Shoulders;Forward head      ROM / Strength  AROM / PROM / Strength AROM;Strength      AROM   Overall AROM Comments Cervical AROM is WNL    AROM Assessment Site Shoulder    Right/Left Shoulder Right;Left    Right Shoulder Extension 51 Degrees    Right Shoulder Flexion 150 Degrees    Right Shoulder ABduction 171 Degrees    Right Shoulder Internal Rotation 73 Degrees    Right Shoulder External Rotation 83 Degrees    Left Shoulder Extension 44 Degrees    Left Shoulder Flexion 153 Degrees    Left Shoulder ABduction 166 Degrees    Left Shoulder Internal Rotation 62 Degrees    Left Shoulder External Rotation 92 Degrees      Strength   Overall Strength Within functional limits for tasks performed               LYMPHEDEMA/ONCOLOGY QUESTIONNAIRE - 07/16/21 0001       Type   Cancer Type Right breast cancer      Lymphedema Assessments   Lymphedema Assessments Upper extremities      Right Upper Extremity Lymphedema   10 cm Proximal to  Olecranon Process 28.1 cm    Olecranon Process 24.7 cm    10 cm Proximal to Ulnar Styloid Process 23 cm    Just Proximal to Ulnar Styloid Process 15.6 cm    Across Hand at [employer] 17.6 cm    At [place] of 2nd Digit 6 cm      Left Upper Extremity Lymphedema   10 cm Proximal to Olecranon Process 28 cm    Olecranon Process 24.4 cm    15 cm Proximal to Ulnar Styloid Process 22.6 cm    10 cm Proximal to Ulnar Styloid Process 15 cm    Just Proximal to Ulnar Styloid Process 17.5 cm    Across Hand at [employer] 5.9 cm             L-DEX FLOWSHEETS - 07/16/21 1900       L-DEX LYMPHEDEMA SCREENING   Measurement Type Unilateral    L-DEX MEASUREMENT EXTREMITY Upper Extremity    POSITION  Standing    DOMINANT SIDE Right    At Risk Side Right    BASELINE SCORE (UNILATERAL) 1.1             The patient was assessed using the L-Dex machine today to produce a lymphedema index baseline score. The patient will be reassessed on a regular basis (typically every 3 months) to obtain new L-Dex scores. If the score is > 6.5 points away from his/her baseline score indicating onset of subclinical lymphedema, it will be recommended to wear a compression garment for 4 weeks, 12 hours per day and then be reassessed. If the score continues to be > 6.5 points from baseline at reassessment, we will initiate lymphedema treatment. Assessing in this manner has a 95% rate of preventing clinically significant lymphedema.      [name] - 07/16/21 0001     Open a tight or new jar No difficulty    Do heavy household chores (wash walls, wash floors) No difficulty    Carry a shopping bag or briefcase No difficulty    Wash your back No difficulty    Use a knife to cut food No difficulty    Recreational activities in which you take some force or impact through your arm, shoulder, or hand (golf, hammering, tennis) No difficulty    During the past week, to what  extent has your arm, shoulder or hand  problem interfered with your normal social activities with family, friends, neighbors, or groups? Not at all    During the past week, to what extent has your arm, shoulder or hand problem limited your work or other regular daily activities Not at all    Arm, shoulder, or hand pain. None    Tingling (pins and needles) in your arm, shoulder, or hand None    Difficulty Sleeping No difficulty    DASH Score 0 %              Objective measurements completed on examination: See above findings.      Patient was instructed today in a home exercise program today for post op shoulder range of motion. These included active assist shoulder flexion in sitting, scapular retraction, wall walking with shoulder abduction, and hands behind head external rotation.  She was encouraged to do these twice a day, holding 3 seconds and repeating 5 times when permitted by her physician.            PT Education - 07/16/21 1912     Education Details Lymphedema education and post op shoulder HEP    Person(s) Educated Patient;Other (comment)   mom and boyfriend   Methods Explanation;Demonstration;Handout    Comprehension Verbalized understanding;Returned demonstration                 PT Long Term Goals - 07/16/21 1917       PT LONG TERM GOAL #1   Title Patient will demonstrate she has regained full shoulder ROM and function post operatively compared ot baselines.    Time 8    Period Weeks    Status New    Target Date 09/10/21             Breast Clinic Goals - 07/16/21 1916       Patient will be able to verbalize understanding of pertinent lymphedema risk reduction practices relevant to her diagnosis specifically related to skin care.   Time 1    Period Days    Status Achieved      Patient will be able to return demonstrate and/or verbalize understanding of the post-op home exercise program related to regaining shoulder range of motion.   Time 1    Period Days    Status  Achieved      Patient will be able to verbalize understanding of the importance of attending the postoperative After Breast Cancer Class for further lymphedema risk reduction education and therapeutic exercise.   Time 1    Period Days    Status Achieved                   Plan - 07/16/21 1913     Clinical Impression Statement Patient was diagnosed on 07/01/2021 with right grade III invasive ductal carcinoma breast cancer. It measures 2.6 cm and is located in the upper outer quadrant. It is ER positive, PR negative, and HER2 negative with a Ki67 of 60%. Her multidisciplinary medical team met prior to her assessments to determine a recommended treatment plan. She is planning to have a bilateral mastectomy and right sentinel node biopsy followed by Oncotype testing, radiation, and anti-estrogen therapy. She will benefit from a post op PT reassessment to determine needs and from L-Dex screens every 3 months for 2 years to detect subclinical lymphedema.    Stability/Clinical Decision Making Stable/Uncomplicated    Rehab Potential Excellent    PT Frequency --  eval and 1 f/u visit   PT Treatment/Interventions ADLs/Self Care Home Management;Therapeutic exercise;Patient/family education    PT Next Visit Plan Will reassess 3-4 weeks post op    PT Home Exercise Plan Post op ROM HEP    Consulted and Agree with Plan of Care Patient;Family member/caregiver    Family Member Consulted Mom and boyfriend             Patient will benefit from skilled therapeutic intervention in order to improve the following deficits and impairments:  Postural dysfunction, Decreased range of motion, Decreased knowledge of precautions, Impaired UE functional use, Pain  Visit Diagnosis: Malignant neoplasm of upper-outer quadrant of right breast in female, estrogen receptor positive ([place]) - Plan: PT plan of care cert/re-cert  Abnormal posture - Plan: PT plan of care cert/re-cert   Patient will follow up at  outpatient cancer rehab 3-4 weeks following surgery.  If the patient requires physical therapy at that time, a specific plan will be dictated and sent to the referring physician for approval. The patient was educated today on appropriate basic range of motion exercises to begin post operatively and the importance of attending the After Breast Cancer class following surgery.  Patient was educated today on lymphedema risk reduction practices as it pertains to recommendations that will benefit the patient immediately following surgery.  She verbalized good understanding.     Problem List Patient Active Problem List   Diagnosis Date Noted   Malignant neoplasm of upper-outer quadrant of right breast in female, estrogen receptor positive ([place]) 07/14/2021   Otitis media 06/11/2021   Mass of breast, right 06/11/2021   Breast calcifications on mammogram 03/16/2021   Encounter for preventive health examination 01/08/2021   Primary insomnia 09/24/2020   PCOS (polycystic ovarian syndrome) 04/16/2020   Paroxysmal SVT (supraventricular tachycardia) ([place]) 04/16/2020   Screen for STD (sexually transmitted disease) 04/16/2020   S/P total hysterectomy 04/16/2020   GAD (generalized anxiety disorder) 11/11/2019   Vitamin D deficiency 09/21/2015   Elevated glucose level 09/21/2015   Obesity (BMI 30.0-34.9) 09/17/2015   Annia Friendly, PT 07/16/21 7:22 PM   [place], [place], [address] Phone: [phone]   Fax:  [phone]  Name: Katelyn Hobbs MRN: [id number] Date of Birth: 1976-08-02

## 2021-07-16 NOTE — Patient Instructions (Signed)

## 2021-07-17 ENCOUNTER — Encounter: Payer: Self-pay | Admitting: Genetic Counselor

## 2021-07-17 DIAGNOSIS — Z1379 Encounter for other screening for genetic and chromosomal anomalies: Secondary | ICD-10-CM | POA: Insufficient documentation

## 2021-07-17 DIAGNOSIS — Z803 Family history of malignant neoplasm of breast: Secondary | ICD-10-CM

## 2021-07-17 DIAGNOSIS — Z808 Family history of malignant neoplasm of other organs or systems: Secondary | ICD-10-CM

## 2021-07-17 DIAGNOSIS — Z8049 Family history of malignant neoplasm of other genital organs: Secondary | ICD-10-CM

## 2021-07-17 DIAGNOSIS — Z8042 Family history of malignant neoplasm of prostate: Secondary | ICD-10-CM

## 2021-07-17 HISTORY — DX: Family history of malignant neoplasm of other organs or systems: Z80.8

## 2021-07-17 HISTORY — DX: Family history of malignant neoplasm of breast: Z80.3

## 2021-07-17 HISTORY — DX: Family history of malignant neoplasm of prostate: Z80.42

## 2021-07-17 HISTORY — DX: Family history of malignant neoplasm of other genital organs: Z80.49

## 2021-07-17 NOTE — Progress Notes (Signed)
REFERRING PROVIDER: Nicholas Lose, MD [place],  [place] [address]  PRIMARY PROVIDER:  Crecencio Mc, MD  PRIMARY REASON FOR VISIT:  1. Malignant neoplasm of upper-outer quadrant of right breast in female, estrogen receptor positive ([place])   2. Family history of breast cancer   3. Family history of prostate cancer   4. Family history of uterine cancer   5. Family history of melanoma      HISTORY OF PRESENT ILLNESS:   Katelyn Hobbs, a 45 y.o. female, was seen for a [place] cancer genetics consultation at the request of Dr. Lindi Adie due to a personal and family history of cancer.  [name] presents to clinic today to discuss the possibility of a hereditary predisposition to cancer, to discuss genetic testing, and to further clarify her future cancer risks, as well as potential cancer risks for family members.   In September 2022, at the age of 12, [name] was diagnosed with invasive ductal carcinoma of the right breast. The preliminary treatment plan is pending.    CANCER HISTORY:  Oncology History  Malignant neoplasm of upper-outer quadrant of right breast in female, estrogen receptor positive ([place])  07/08/2021 Initial Diagnosis   Palpable right breast lump. Diagnostic mammogram: showed highly suspicious right breast mass and an indeterminate single right axillary lymph node with up to 5 mm diffuse cortical thickening. Biopsy: grade 2-3 invasive ductal carcinoma, DCIS, and right axillary lymph node negative for metastatic carcinoma; ER+(80%)/PR-/Her2-.   07/16/2021 Cancer Staging   Staging form: Breast, AJCC 8th Edition - Clinical stage from 07/16/2021: Stage IIB (cT2, cN0, cM0, G3, ER+, PR-, HER2-) - Signed by Nicholas Lose, MD on 07/16/2021 Stage prefix: Initial diagnosis Histologic grading system: 3 grade system      RISK FACTORS:  Menarche was at age 66 or 35.  Nulliparous.  OCP use for more than 20 years. Ovaries intact: yes.   Hysterectomy: yes.  HRT use: 0 years. Colonoscopy: no; not examined. Mammogram within the last year: yes. Up to date with pelvic exams: most recent PAP March 2022.   Past Medical History:  Diagnosis Date   Abnormal uterine bleeding    ADHD (attention deficit hyperactivity disorder)    Anemia    Anxiety    Bacterial vaginosis    Depression    Endometriosis    Family history of breast cancer 07/17/2021   Family history of melanoma 07/17/2021   Family history of prostate cancer 07/17/2021   Family history of uterine cancer 07/17/2021   Hyperlipidemia    Migraine    PCOS (polycystic ovarian syndrome)     Past Surgical History:  Procedure Laterality Date   LAPAROSCOPY  1993   SALPINGECTOMY Bilateral 2015   VAGINAL HYSTERECTOMY  2017    Social History   Socioeconomic History   Marital status: Divorced    Spouse name: Not on file   Number of children: 2   Years of education: Not on file   Highest education level: Not on file  Occupational History   Occupation: RN  Tobacco Use   Smoking status: Former   Smokeless tobacco: Never  Scientific laboratory technician Use: Never used  Substance and Sexual Activity   Alcohol use: Yes    Alcohol/week: 0.0 standard drinks    Comment: occas   Drug use: No   Sexual activity: Yes    Birth control/protection: Surgical  Other Topics Concern   Not on file  Social History Narrative   2 adopted  children: 77 yo son, 47 yo daughter,    Social Determinants of Radio broadcast assistant Strain: Not on file  Food Insecurity: Not on file  Transportation Needs: Not on file  Physical Activity: Not on file  Stress: Not on file  Social Connections: Not on file     FAMILY HISTORY:  We obtained a detailed, 4-generation family history.  Significant diagnoses are listed below: Family History  Problem Relation Age of Onset   Uterine cancer Mother 60   Melanoma Sister 76       T1a; on back   Prostate cancer Paternal Grandfather        dx after  4   Breast cancer Other 4       MGM's mother    Ms. Barritt is unaware of previous family history of genetic testing for hereditary cancer risks. There is no reported Ashkenazi Jewish ancestry. There is no known consanguinity.  GENETIC COUNSELING ASSESSMENT: Ms. Happe is a 45 y.o. female with a personal and family history of cancer which is somewhat suggestive of a hereditary cancer syndrome and predisposition to cancer given her age of diagnosis and the presence of related cancers in the family. We, therefore, discussed and recommended the following at today's visit.   DISCUSSION: We discussed that 5 - 10% of cancer is hereditary, with most cases of hereditary breast cancer associated with mutations in BRCA1/2.  There are other genes that can be associated with hereditary breast cancer syndromes.  Type of cancer risk and level of risk are gene-specific.  We discussed that testing is beneficial for several reasons including knowing how to follow individuals after completing their treatment, identifying whether potential treatment options would be beneficial, and understanding if other family members could be at risk for cancer and allowing them to undergo genetic testing.   We reviewed the characteristics, features and inheritance patterns of hereditary cancer syndromes. We also discussed genetic testing, including the appropriate family members to test, the process of testing, insurance coverage and turn-around-time for results. We discussed the implications of a negative, positive, carrier and/or variant of uncertain significant result. We recommended Ms. Spath pursue genetic testing for a panel that includes genes associated with breast cancer.   Based on Ms. Mitch's personal history of cancer, she meets medical criteria for genetic testing. Despite that she meets criteria, she may still have an out of pocket cost.   PLAN: Ms. Gott did not wish to pursue genetic testing at today's  visit. We understand this decision and remain available to coordinate genetic testing at any time in the future. We, therefore, recommend Ms. Casino continue to follow the cancer screening guidelines given by her oncology and primary healthcare providers.  Lastly, we encouraged Ms. Kube to remain in contact with cancer genetics annually so that we can continuously update the family history and inform her of any changes in cancer genetics and testing that may be of benefit for this family.   Ms. Reggio's questions were answered to her satisfaction today. Our contact information was provided should additional questions or concerns arise. Thank you for the referral and allowing [place] to share in the care of your patient.   Luisantonio Adinolfi M. Joette Catching, [place], [facility] Genetic Counselor [email or web address] (P) [phone]  The patient was seen for a total of 20 minutes in face-to-face genetic counseling.  This patient was accompanied by  her mother and her partner, Abe People. Drs. Magrinat, Lindi Adie and/or Burr Medico were available to discuss this case as needed.  _______________________________________________________________________ For Office Staff:  Number of people involved in session: 3 Was an Intern/ student involved with case: no

## 2021-07-18 ENCOUNTER — Telehealth: Payer: Self-pay | Admitting: Hematology and Oncology

## 2021-07-18 NOTE — Telephone Encounter (Signed)
Scheduled per sch msg. Called and left msg  

## 2021-07-19 ENCOUNTER — Other Ambulatory Visit: Payer: Self-pay

## 2021-07-19 ENCOUNTER — Ambulatory Visit
Admission: RE | Admit: 2021-07-19 | Discharge: 2021-07-19 | Disposition: A | Discharge status: Home / Self Care | Payer: BC Managed Care – PPO | Source: Ambulatory Visit | Attending: Internal Medicine | Admitting: Internal Medicine

## 2021-07-19 DIAGNOSIS — C50411 Malignant neoplasm of upper-outer quadrant of right female breast: Secondary | ICD-10-CM | POA: Diagnosis not present

## 2021-07-19 DIAGNOSIS — N63 Unspecified lump in unspecified breast: Secondary | ICD-10-CM

## 2021-07-19 DIAGNOSIS — N6310 Unspecified lump in the right breast, unspecified quadrant: Secondary | ICD-10-CM | POA: Diagnosis not present

## 2021-07-19 DIAGNOSIS — R921 Mammographic calcification found on diagnostic imaging of breast: Secondary | ICD-10-CM

## 2021-07-19 IMAGING — MR MR BREAST BILAT WO/W CM
8 of 13 series · 31 of 48 positions shown · IV contrast (9ml gadavist)
Comparison: Previous exam(s).

CLINICAL DATA: Had biopsy [DATE] on right breast pos for cancerToday
scan is for a new lump right breast with painShe has noticed this
lump since [DATE]No radiation or chemo she is having both breast
removed. Patient initially was a screening recall, screening exam
[DATE], for left breast calcifications. These were assessed with
diagnostic imaging on [DATE], thought to be probably benign with
short-term follow-up recommended. However, she returned on
[DATE] for a palpable right breast mass. This was found to be a
highly suspicious mass measuring 2.6 cm. A single right axillary
lymph node with a cortical thickness 5 mm was also found at that
time. The mass was biopsied, positive for invasive carcinoma, with a
lymph node also biopsied, benign and reactive, without malignancy.
At this time, the left breast calcifications have not been sampled.
Based on the above clinical data, patient is planning for bilateral
mastectomies.

LABS:  No labs drawn at time of imaging
EXAM:
BILATERAL BREAST MRI WITH AND WITHOUT CONTRAST
TECHNIQUE: Multiplanar, multisequence MR images of both breasts were obtained
prior to and following the intravenous administration of 9 ml of
Gadavist

[Series 4: t2_tirm_tra ipat (a-p) · axial · 3.0mm · 0.78mm/px · 1 of 55 slices shown]
[im 1/55]
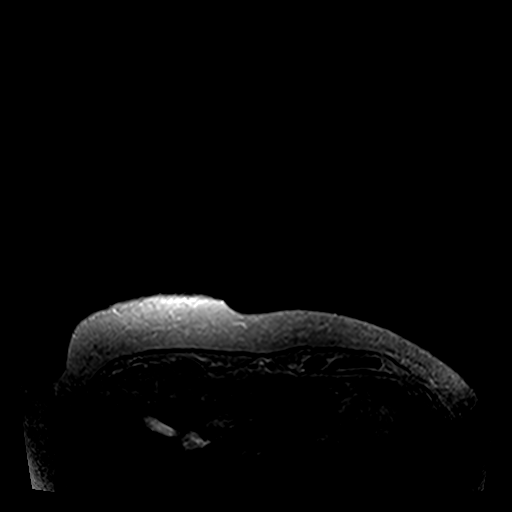

[Series 5: fl3d pre-cm no · axial · non-contrast · 1.2mm · 1.04mm/px · z∈[-102,+89]mm · 5 of 160 slices shown]
[im 1/160]
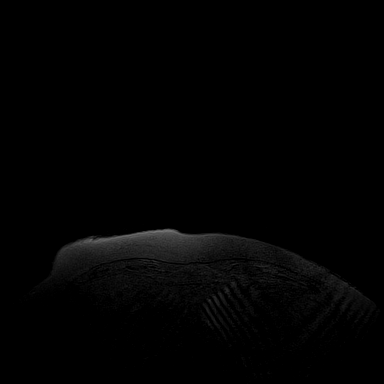
[im 40/160]
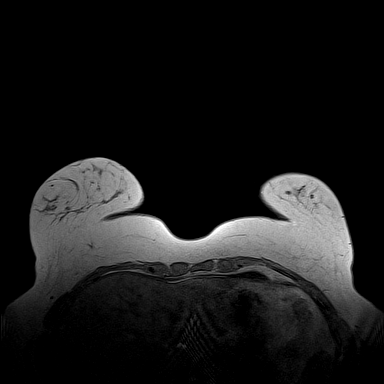
[im 80/160]
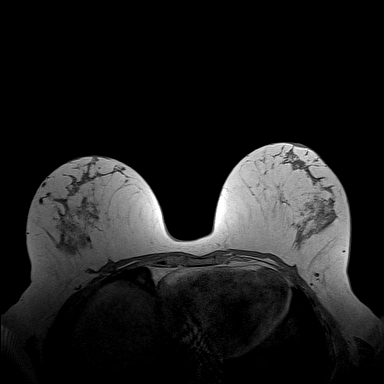
[im 120/160]
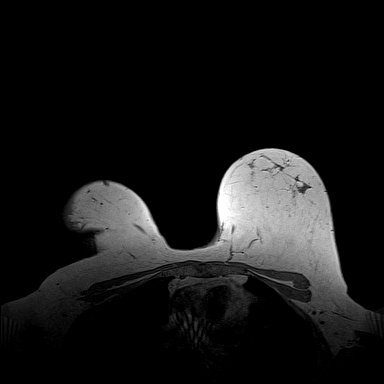
[im 160/160]
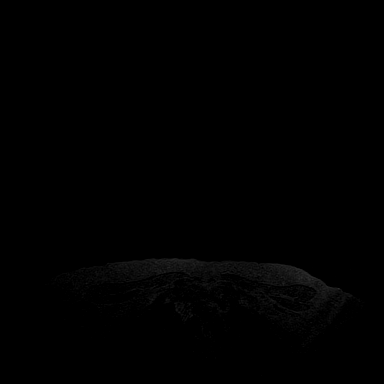

[Series 6: fl3d pre-cm · axial · non-contrast · 1.2mm · 1.04mm/px · z∈[-92,+80]mm · 5 of 144 slices shown]
[im 1/144]
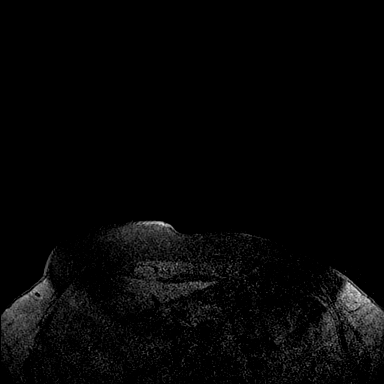
[im 36/144]
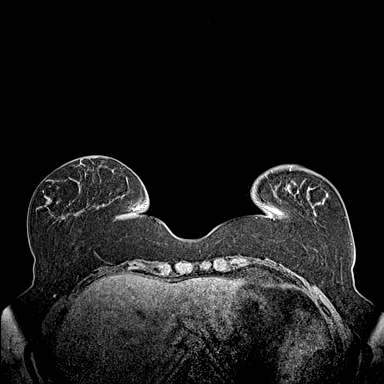
[im 72/144]
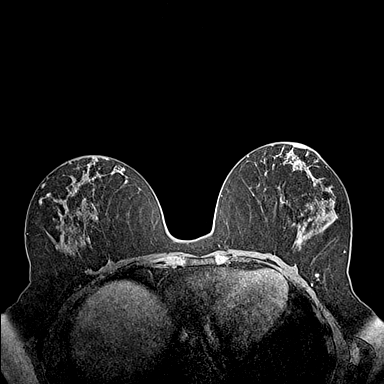
[im 108/144]
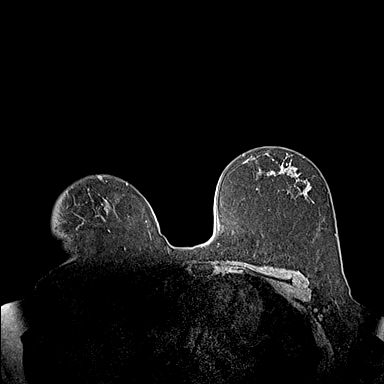
[im 144/144]
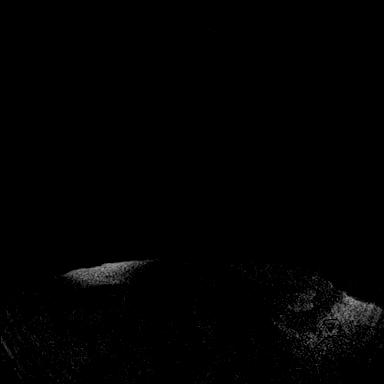

[Series 7: fl3d post-cm 20 · axial · 1.2mm · 1.04mm/px · z∈[-92,+80]mm · 5 of 144 slices shown (1 of 3)]
[im 1/144]
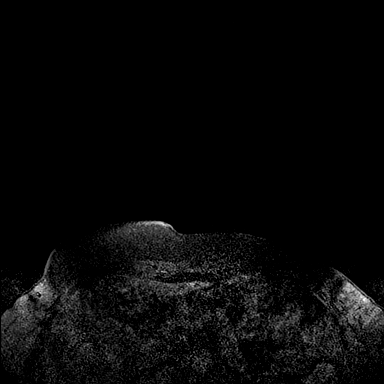
[im 36/144]
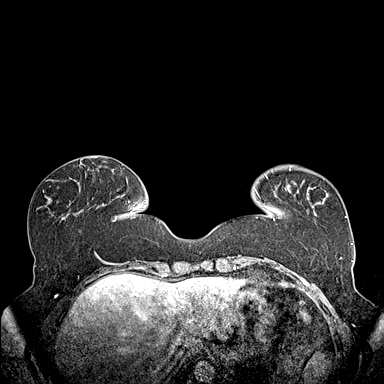
[im 72/144]
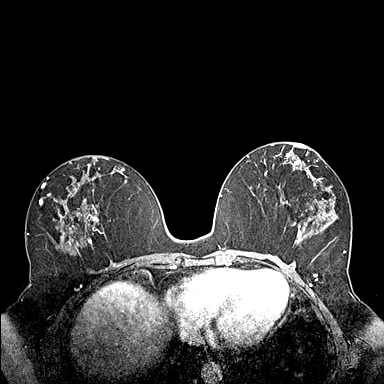
[im 108/144]
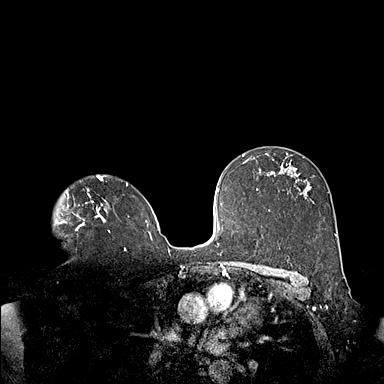
[im 144/144]
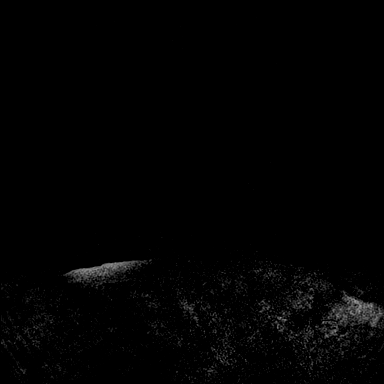

[Series 8: fl3d post-cm 20 · axial · 1.2mm · 1.04mm/px · z∈[-92,+80]mm · 5 of 144 slices shown (2 of 3)]
[im 1/144]
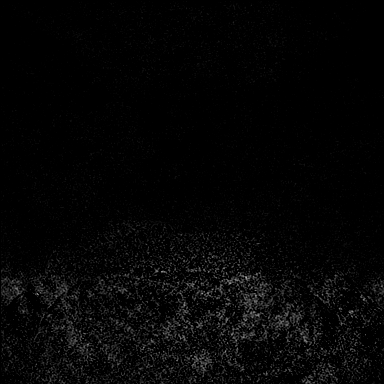
[im 36/144]
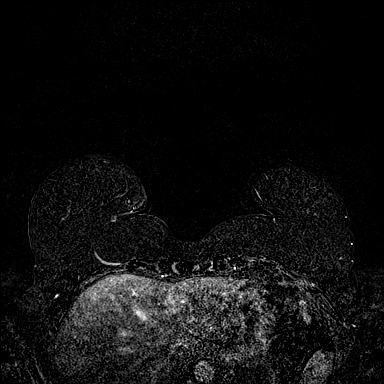
[im 72/144]
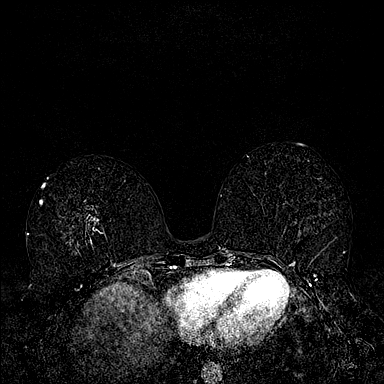
[im 108/144]
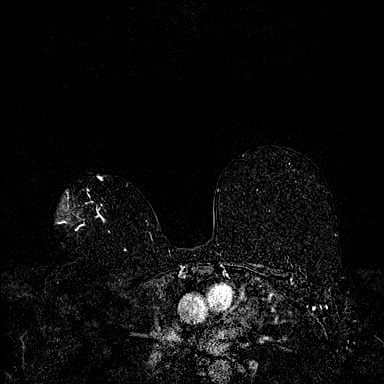
[im 144/144]
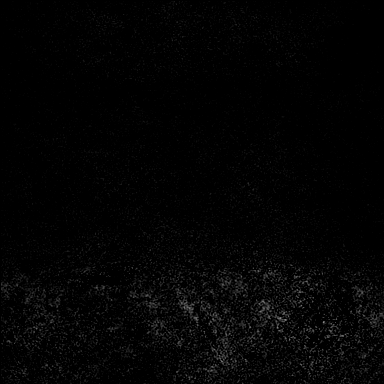

[Series 9: fl3d post-cm 20 · axial · 172.8mm · 1.04mm/px · 1 of 1 slices shown (3 of 3)]
[im 1/1]
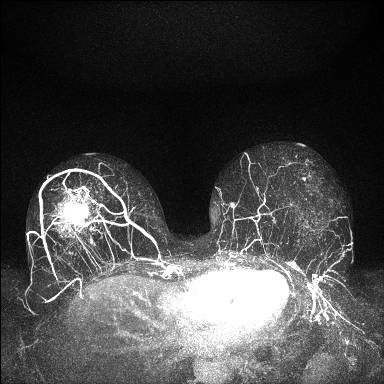

[Series 10: fl3d post-cm 3 · axial · 1.2mm · 1.04mm/px · z∈[-92,+80]mm · 5 of 144 slices shown (1 of 2)]
[im 1/144]
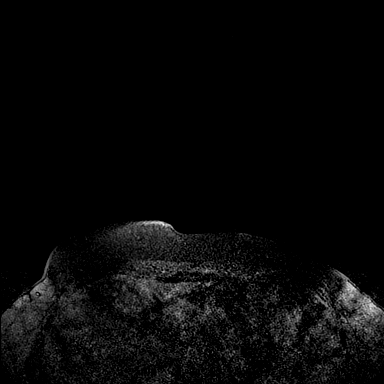
[im 36/144]
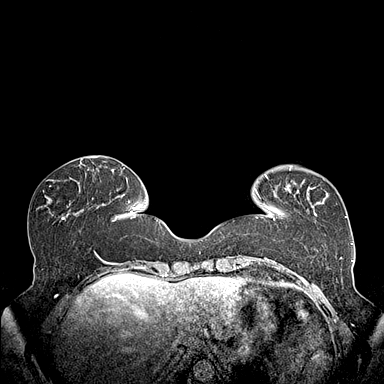
[im 72/144]
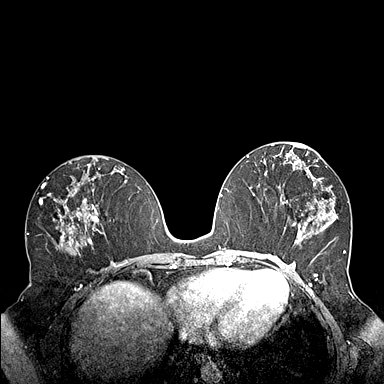
[im 108/144]
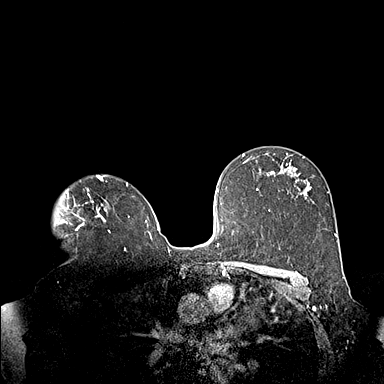
[im 144/144]
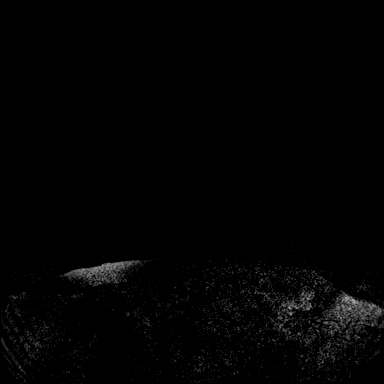

[Series 11: fl3d post-cm 3 · axial · 1.2mm · 1.04mm/px · z∈[-92,+10]mm · 4 of 143 slices shown (2 of 2)]
[im 1/143]
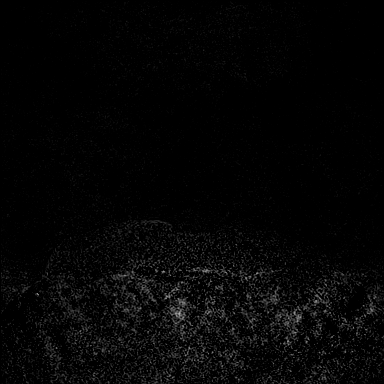
[im 29/143]
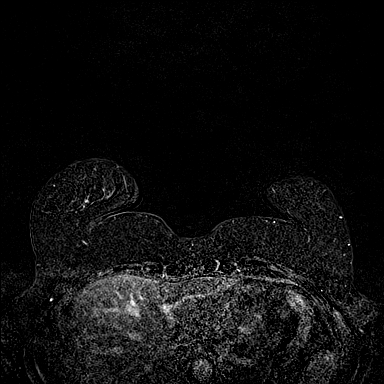
[im 57/143]
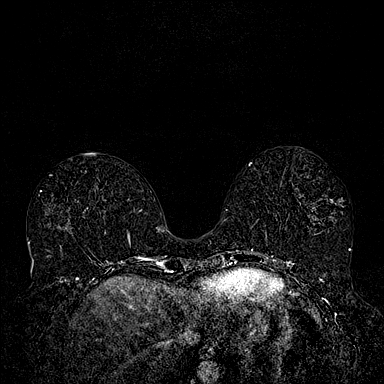
[im 86/143]
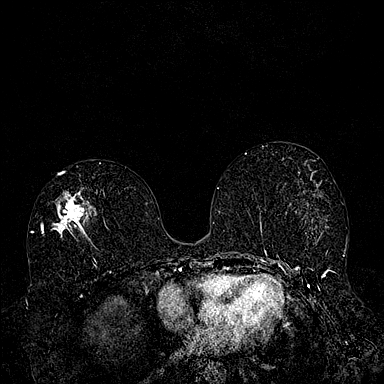

[31 of 48 positions shown; findings below may reference images not displayed]

Three-dimensional MR images were rendered by post-processing of the
original MR data on an independent workstation. The
three-dimensional MR images were interpreted, and findings are
reported in the following complete MRI report for this study. Three
dimensional images were evaluated at the independent interpreting
workstation using the DynaCAD thin client.
FINDINGS: Breast composition: b. Scattered fibroglandular tissue.

Background parenchymal enhancement: Mild

Right breast: Enhancing upper-outer quadrant mas,s containing
central susceptibility artifact from the post biopsy marker clip,
represents the biopsy proven right breast malignancy. It measures
2.5 x 2.4 x 2.5 cm. There is surrounding non mass enhancement with
an overall extent of the mass and non mass enhancement 5.7 x 4.1 x
4.7 cm. There are no other right breast masses or areas of abnormal
enhancement.

Left breast: No mass or abnormal enhancement.

Lymph nodes: No abnormal appearing lymph nodes.

Ancillary findings:  None.
IMPRESSION: 1. Biopsy-proven, 2.5 cm, malignancy in the upper outer right
breast.
2. There is surrounding non mass enhancement suspicious for
additional malignancy, overall extent measuring 5.7 x 4.1 x 5.7 cm
(AP x SI X TRV).
3. No evidence of left breast malignancy. Specifically, no abnormal
enhancement to correspond to the probably benign left breast
calcifications.
4. No evidence of metastatic lymphadenopathy.

RECOMMENDATION:
1. Treatment as planned for the known right breast carcinoma.
2. If the patient chooses not to undergo bilateral mastectomies and
opts for breast conservation, additional biopsies of the extent of
non mass enhancement in the right breast, as well as stereotactic
core needle biopsy of the left breast calcifications, would be
recommended.

BI-RADS CATEGORY  6: Known biopsy-proven malignancy.

## 2021-07-19 MED ORDER — GADOBENATE DIMEGLUMINE 529 MG/ML IV SOLN
10.0000 mL | Freq: Once | INTRAVENOUS | Status: AC | PRN
  Administered 2021-07-19: 10 mL via INTRAVENOUS

## 2021-07-22 ENCOUNTER — Other Ambulatory Visit: Payer: Self-pay | Admitting: Internal Medicine

## 2021-07-22 ENCOUNTER — Other Ambulatory Visit: Payer: BC Managed Care – PPO

## 2021-07-23 ENCOUNTER — Encounter: Payer: Self-pay | Admitting: *Deleted

## 2021-07-23 ENCOUNTER — Telehealth: Payer: Self-pay | Admitting: *Deleted

## 2021-07-23 NOTE — Telephone Encounter (Signed)
Spoke with patient to follow up from [facility] and assess navigation needs.  Patient originally was interested in starting tamoxifen prior to sx but now has changed her mind. No further questions or concerns at this time.  Encouraged her to call should anything arise. Patient verbalized understanding.

## 2021-07-31 ENCOUNTER — Encounter: Payer: Self-pay | Admitting: General Practice

## 2021-07-31 NOTE — Progress Notes (Signed)
[facility] Spiritual Care Note  Left voicemail of care and support with encouragement to return call.   [place], [place], [facility] Pager [phone] Voicemail [phone]

## 2021-08-06 ENCOUNTER — Telehealth: Payer: Self-pay | Admitting: *Deleted

## 2021-08-06 NOTE — Telephone Encounter (Signed)
Received call from patient stating she has been having some discomfort in her right breast not in the area of her biopsy. She states it feels hard and is warm to touch. She has been taking ibuprofen which helps. She would feel better if someone look at this for her.   Spoke with Dr. Marlou Starks and he said he would see her to evaluate this.  Message sent to [facility] for an appt. Informed her his office will call her with an appointment. Patient verbalized understanding.

## 2021-08-12 DIAGNOSIS — Z170 Estrogen receptor positive status [ER+]: Secondary | ICD-10-CM | POA: Diagnosis not present

## 2021-08-12 DIAGNOSIS — C50411 Malignant neoplasm of upper-outer quadrant of right female breast: Secondary | ICD-10-CM | POA: Diagnosis not present

## 2021-08-13 NOTE — Progress Notes (Signed)
Surgical Instructions    Your procedure is scheduled on Monday, October 24th.  Report to [facility] Main Entrance "A" at 12:00 P.M., then check in with the Admitting office.  Call this number if you have problems the morning of surgery:  [phone]   If you have any questions prior to your surgery date call [phone]: Open Monday-Friday 8am-4pm    Remember:  Do not eat after midnight the night before your surgery  You may drink clear liquids until 11:00 AM the morning of your surgery.   Clear liquids allowed are: Water, Non-Citrus Juices (without pulp), Carbonated Beverages, Clear Tea, Black Coffee ONLY (NO MILK, CREAM OR POWDERED CREAMER of any kind), and Gatorade    Take these medicines the morning of surgery with A SIP OF WATER Citalopram (Celexa) Tylenol - if needed    As of today, STOP taking any Aspirin (unless otherwise instructed by your surgeon) Aleve, Naproxen, Ibuprofen, Motrin, Advil, Goody's, BC's, all herbal medications, fish oil, and all vitamins.              DAY OF SURGERY        Do not wear jewelry, makeup, or nail polish Do not wear lotions, powders, perfumes, or deodorant. Do not shave 48 hours prior to surgery.   Do not bring valuables to the hospital.             [facility] is not responsible for any belongings or valuables.  Do NOT Smoke (Tobacco/Vaping)  24 hours prior to your procedure  If you use a CPAP at night, you may bring your mask for your overnight stay.   Contacts, glasses, hearing aids, dentures or partials may not be worn into surgery, please bring cases for these belongings   For patients admitted to the hospital, discharge time will be determined by your treatment team.   Patients discharged the day of surgery will not be allowed to drive home, and someone needs to stay with them for 24 hours.  NO VISITORS WILL BE ALLOWED IN PRE-OP WHERE PATIENTS ARE PREPPED FOR SURGERY.  ONLY 1 SUPPORT PERSON MAY BE PRESENT IN THE WAITING ROOM  WHILE YOU ARE IN SURGERY.  IF YOU ARE TO BE ADMITTED, ONCE YOU ARE IN YOUR ROOM YOU WILL BE ALLOWED TWO (2) VISITORS. 1 (ONE) VISITOR MAY STAY OVERNIGHT BUT MUST ARRIVE TO THE ROOM BY 8pm.  Minor children may have two parents present. Special consideration for safety and communication needs will be reviewed on a case by case basis.  Special instructions:    Oral Hygiene is also important to reduce your risk of infection.  Remember - BRUSH YOUR TEETH THE MORNING OF SURGERY WITH YOUR REGULAR TOOTHPASTE   [place]- Preparing For Surgery  Before surgery, you can play an important role. Because skin is not sterile, your skin needs to be as free of germs as possible. You can reduce the number of germs on your skin by washing with CHG (chlorahexidine gluconate) Soap before surgery.  CHG is an antiseptic cleaner which kills germs and bonds with the skin to continue killing germs even after washing.     Please do not use if you have an allergy to CHG or antibacterial soaps. If your skin becomes reddened/irritated stop using the CHG.  Do not shave (including legs and underarms) for at least 48 hours prior to first CHG shower. It is OK to shave your face.  Please follow these instructions carefully.     Shower the [employer]  BEFORE SURGERY and the MORNING OF SURGERY with CHG Soap.   If you chose to wash your hair, wash your hair first as usual with your normal shampoo. After you shampoo, rinse your hair and body thoroughly to remove the shampoo.  Then [employer] and genitals (private parts) with your normal soap and rinse thoroughly to remove soap.  After that Use CHG Soap as you would any other liquid soap. You can apply CHG directly to the skin and wash gently with a scrungie or a clean washcloth.   Apply the CHG Soap to your body ONLY FROM THE NECK DOWN.  Do not use on open wounds or open sores. Avoid contact with your eyes, ears, mouth and genitals (private parts). Wash Face and genitals (private parts)   with your normal soap.   Wash thoroughly, paying special attention to the area where your surgery will be performed.  Thoroughly rinse your body with warm water from the neck down.  DO NOT shower/wash with your normal soap after using and rinsing off the CHG Soap.  Pat yourself dry with a CLEAN TOWEL.  Wear CLEAN PAJAMAS to bed the night before surgery  Place CLEAN SHEETS on your bed the night before your surgery  DO NOT SLEEP WITH PETS.   Day of Surgery:  Take a shower with CHG soap. Wear Clean/Comfortable clothing the morning of surgery Do not apply any deodorants/lotions.   Remember to brush your teeth WITH YOUR REGULAR TOOTHPASTE.   Please read over the following fact sheets that you were given.

## 2021-08-14 ENCOUNTER — Encounter (HOSPITAL_COMMUNITY): Payer: Self-pay

## 2021-08-14 ENCOUNTER — Encounter (HOSPITAL_COMMUNITY)
Admission: RE | Admit: 2021-08-14 | Discharge: 2021-08-14 | Disposition: A | Discharge status: Home / Self Care | Payer: BC Managed Care – PPO | Source: Ambulatory Visit | Attending: General Surgery | Admitting: General Surgery

## 2021-08-14 ENCOUNTER — Other Ambulatory Visit: Payer: Self-pay

## 2021-08-14 VITALS — BP 118/67 | HR 85 | Temp 98.1°F | Resp 18 | Ht 65.0 in | Wt 178.0 lb

## 2021-08-14 DIAGNOSIS — Z01818 Encounter for other preprocedural examination: Secondary | ICD-10-CM

## 2021-08-14 DIAGNOSIS — F419 Anxiety disorder, unspecified: Secondary | ICD-10-CM | POA: Diagnosis not present

## 2021-08-14 DIAGNOSIS — Z20822 Contact with and (suspected) exposure to covid-19: Secondary | ICD-10-CM | POA: Diagnosis not present

## 2021-08-14 DIAGNOSIS — G479 Sleep disorder, unspecified: Secondary | ICD-10-CM | POA: Diagnosis not present

## 2021-08-14 DIAGNOSIS — C44501 Unspecified malignant neoplasm of skin of breast: Secondary | ICD-10-CM | POA: Diagnosis not present

## 2021-08-14 DIAGNOSIS — Z01812 Encounter for preprocedural laboratory examination: Secondary | ICD-10-CM | POA: Insufficient documentation

## 2021-08-14 DIAGNOSIS — D649 Anemia, unspecified: Secondary | ICD-10-CM

## 2021-08-14 LAB — CBC
HCT: 41.8 % (ref 36.0–46.0)
Hemoglobin: 14.1 g/dL (ref 12.0–15.0)
MCH: 31.5 pg (ref 26.0–34.0)
MCHC: 33.7 g/dL (ref 30.0–36.0)
MCV: 93.5 fL (ref 80.0–100.0)
Platelets: 253 10*3/uL (ref 150–400)
RBC: 4.47 MIL/uL (ref 3.87–5.11)
RDW: 12.5 % (ref 11.5–15.5)
WBC: 6.8 10*3/uL (ref 4.0–10.5)
nRBC: 0 % (ref 0.0–0.2)

## 2021-08-14 LAB — SARS CORONAVIRUS 2 (TAT 6-24 HRS): SARS Coronavirus 2: NEGATIVE

## 2021-08-14 NOTE — Progress Notes (Signed)
PCP - Dr. Helene Kelp L. Tullo Cardiologist - denies  PPM/ICD - denies   Chest x-ray - 08/19/16 EKG - 08/20/16 Stress Test - denies ECHO - denies Cardiac Cath - denies  Sleep Study - denies  DM- denies  Blood Thinner Instructions: n/a Aspirin Instructions: n/a  ERAS Protcol - yes, no drink   COVID TEST- 08/14/21 at PAT appt   Anesthesia review: No  Patient denies shortness of breath, fever, cough and chest pain at PAT appointment   All instructions explained to the patient, with a verbal understanding of the material. Patient agrees to go over the instructions while at home for a better understanding. Patient also instructed to wear a mask in public after being tested for COVID-19. The opportunity to ask questions was provided.

## 2021-08-15 DIAGNOSIS — Z9012 Acquired absence of left breast and nipple: Secondary | ICD-10-CM | POA: Diagnosis not present

## 2021-08-15 DIAGNOSIS — C50911 Malignant neoplasm of unspecified site of right female breast: Secondary | ICD-10-CM | POA: Diagnosis not present

## 2021-08-18 ENCOUNTER — Ambulatory Visit (HOSPITAL_COMMUNITY): Payer: BC Managed Care – PPO | Admitting: Anesthesiology

## 2021-08-18 ENCOUNTER — Ambulatory Visit (HOSPITAL_COMMUNITY)
Admission: RE | Admit: 2021-08-18 | Discharge: 2021-08-18 | Disposition: A | Discharge status: Home / Self Care | Payer: BC Managed Care – PPO | Source: Ambulatory Visit | Attending: General Surgery | Admitting: General Surgery

## 2021-08-18 ENCOUNTER — Other Ambulatory Visit: Payer: Self-pay

## 2021-08-18 ENCOUNTER — Encounter (HOSPITAL_COMMUNITY): Payer: Self-pay | Admitting: General Surgery

## 2021-08-18 ENCOUNTER — Encounter (HOSPITAL_COMMUNITY)
Admission: RE | Disposition: A | Discharge status: Home / Self Care | Payer: Self-pay | Source: Home / Self Care | Attending: General Surgery

## 2021-08-18 ENCOUNTER — Ambulatory Visit (HOSPITAL_COMMUNITY)
Admission: RE | Admit: 2021-08-18 | Discharge: 2021-08-19 | Disposition: A | Discharge status: Home / Self Care | Payer: BC Managed Care – PPO | Attending: General Surgery | Admitting: General Surgery

## 2021-08-18 DIAGNOSIS — Z170 Estrogen receptor positive status [ER+]: Secondary | ICD-10-CM | POA: Insufficient documentation

## 2021-08-18 DIAGNOSIS — E559 Vitamin D deficiency, unspecified: Secondary | ICD-10-CM | POA: Diagnosis not present

## 2021-08-18 DIAGNOSIS — N6012 Diffuse cystic mastopathy of left breast: Secondary | ICD-10-CM | POA: Diagnosis not present

## 2021-08-18 DIAGNOSIS — G8918 Other acute postprocedural pain: Secondary | ICD-10-CM | POA: Diagnosis not present

## 2021-08-18 DIAGNOSIS — Z87891 Personal history of nicotine dependence: Secondary | ICD-10-CM | POA: Diagnosis not present

## 2021-08-18 DIAGNOSIS — Z79899 Other long term (current) drug therapy: Secondary | ICD-10-CM | POA: Insufficient documentation

## 2021-08-18 DIAGNOSIS — Z91040 Latex allergy status: Secondary | ICD-10-CM | POA: Insufficient documentation

## 2021-08-18 DIAGNOSIS — N641 Fat necrosis of breast: Secondary | ICD-10-CM | POA: Diagnosis not present

## 2021-08-18 DIAGNOSIS — C50911 Malignant neoplasm of unspecified site of right female breast: Secondary | ICD-10-CM | POA: Diagnosis present

## 2021-08-18 DIAGNOSIS — C50411 Malignant neoplasm of upper-outer quadrant of right female breast: Secondary | ICD-10-CM | POA: Insufficient documentation

## 2021-08-18 HISTORY — PX: TOTAL MASTECTOMY: SHX6129

## 2021-08-18 HISTORY — PX: MASTECTOMY W/ SENTINEL NODE BIOPSY: SHX2001

## 2021-08-18 SURGERY — MASTECTOMY WITH SENTINEL LYMPH NODE BIOPSY
Anesthesia: General | Site: Breast | Laterality: Right

## 2021-08-18 MED ORDER — PHENYLEPHRINE HCL-NACL 20-0.9 MG/250ML-% IV SOLN: INTRAVENOUS | Status: DC | PRN

## 2021-08-18 MED ORDER — CHLORHEXIDINE GLUCONATE CLOTH 2 % EX PADS: 6.0000 | MEDICATED_PAD | Freq: Once | CUTANEOUS | Status: DC

## 2021-08-18 MED ORDER — MAGTRACE LYMPHATIC TRACER
INTRAMUSCULAR | Status: DC | PRN
  Administered 2021-08-18: 2 mL via INTRAMUSCULAR

## 2021-08-18 MED ORDER — PROMETHAZINE HCL 25 MG/ML IJ SOLN: 6.2500 mg | INTRAMUSCULAR | Status: DC | PRN

## 2021-08-18 MED ORDER — CELECOXIB 200 MG PO CAPS: 200.0000 mg | ORAL_CAPSULE | Freq: Once | ORAL | Status: AC

## 2021-08-18 MED ORDER — ORAL CARE MOUTH RINSE: 15.0000 mL | Freq: Once | OROMUCOSAL | Status: AC

## 2021-08-18 MED ORDER — CELECOXIB 200 MG PO CAPS: 200.0000 mg | ORAL_CAPSULE | ORAL | Status: DC

## 2021-08-18 MED ORDER — HYDROCODONE-ACETAMINOPHEN 5-325 MG PO TABS
1.0000 | ORAL_TABLET | ORAL | Status: DC | PRN
  Administered 2021-08-18 – 2021-08-19 (×4): 2 via ORAL
  Filled 2021-08-18 (×4): qty 2

## 2021-08-18 MED ORDER — FENTANYL CITRATE (PF) 100 MCG/2ML IJ SOLN
INTRAMUSCULAR | Status: AC
  Administered 2021-08-18: 100 ug via INTRAVENOUS
  Filled 2021-08-18: qty 2

## 2021-08-18 MED ORDER — BUPIVACAINE HCL (PF) 0.25 % IJ SOLN
INTRAMUSCULAR | Status: DC | PRN
  Administered 2021-08-18 (×2): 20 mL via PERINEURAL

## 2021-08-18 MED ORDER — METHOCARBAMOL 500 MG PO TABS: 500.0000 mg | ORAL_TABLET | Freq: Four times a day (QID) | ORAL | Status: DC | PRN

## 2021-08-18 MED ORDER — ONDANSETRON 4 MG PO TBDP
4.0000 mg | ORAL_TABLET | Freq: Four times a day (QID) | ORAL | Status: DC | PRN
  Filled 2021-08-18: qty 1

## 2021-08-18 MED ORDER — DEXAMETHASONE SODIUM PHOSPHATE 10 MG/ML IJ SOLN
INTRAMUSCULAR | Status: AC
  Filled 2021-08-18: qty 1

## 2021-08-18 MED ORDER — PROPOFOL 10 MG/ML IV BOLUS
INTRAVENOUS | Status: AC
  Filled 2021-08-18: qty 20

## 2021-08-18 MED ORDER — EPHEDRINE 5 MG/ML INJ
INTRAVENOUS | Status: AC
  Filled 2021-08-18: qty 5

## 2021-08-18 MED ORDER — LIDOCAINE HCL (CARDIAC) PF 100 MG/5ML IV SOSY
PREFILLED_SYRINGE | INTRAVENOUS | Status: DC | PRN
  Administered 2021-08-18: 100 mg via INTRAVENOUS

## 2021-08-18 MED ORDER — ROCURONIUM BROMIDE 100 MG/10ML IV SOLN
INTRAVENOUS | Status: DC | PRN
Start: 2021-08-18 — End: 2021-08-18
  Administered 2021-08-18: 50 mg via INTRAVENOUS

## 2021-08-18 MED ORDER — FENTANYL CITRATE (PF) 100 MCG/2ML IJ SOLN
INTRAMUSCULAR | Status: AC
  Administered 2021-08-18: 25 ug via INTRAVENOUS
  Filled 2021-08-18: qty 2

## 2021-08-18 MED ORDER — SUGAMMADEX SODIUM 200 MG/2ML IV SOLN
INTRAVENOUS | Status: DC | PRN
  Administered 2021-08-18: 160 mg via INTRAVENOUS

## 2021-08-18 MED ORDER — LACTATED RINGERS IV SOLN: INTRAVENOUS | Status: DC

## 2021-08-18 MED ORDER — METOPROLOL TARTRATE 5 MG/5ML IV SOLN
INTRAVENOUS | Status: AC
  Filled 2021-08-18: qty 5

## 2021-08-18 MED ORDER — TECHNETIUM TC 99M TILMANOCEPT KIT
1.0000 | PACK | Freq: Once | INTRAVENOUS | Status: AC | PRN
  Administered 2021-08-18: 1 via INTRADERMAL

## 2021-08-18 MED ORDER — PHENYLEPHRINE HCL-NACL 20-0.9 MG/250ML-% IV SOLN
INTRAVENOUS | Status: DC | PRN
  Administered 2021-08-18: 20 ug/min via INTRAVENOUS

## 2021-08-18 MED ORDER — ALPRAZOLAM ER 0.5 MG PO TB24: 0.5000 mg | ORAL_TABLET | Freq: Every day | ORAL | Status: DC

## 2021-08-18 MED ORDER — ONDANSETRON HCL 4 MG/2ML IJ SOLN
INTRAMUSCULAR | Status: AC
  Filled 2021-08-18: qty 2

## 2021-08-18 MED ORDER — ONDANSETRON HCL 4 MG/2ML IJ SOLN
4.0000 mg | Freq: Four times a day (QID) | INTRAMUSCULAR | Status: DC | PRN
  Administered 2021-08-18 – 2021-08-19 (×2): 4 mg via INTRAVENOUS
  Filled 2021-08-18 (×2): qty 2

## 2021-08-18 MED ORDER — FENTANYL CITRATE (PF) 100 MCG/2ML IJ SOLN: 25.0000 ug | INTRAMUSCULAR | Status: DC | PRN

## 2021-08-18 MED ORDER — LACTATED RINGERS IV SOLN: INTRAVENOUS | Status: DC | PRN

## 2021-08-18 MED ORDER — ACETAMINOPHEN 500 MG PO TABS: 1000.0000 mg | ORAL_TABLET | ORAL | Status: DC

## 2021-08-18 MED ORDER — MIDAZOLAM HCL 2 MG/2ML IJ SOLN: 2.0000 mg | Freq: Once | INTRAMUSCULAR | Status: AC

## 2021-08-18 MED ORDER — CHLORHEXIDINE GLUCONATE 0.12 % MT SOLN
OROMUCOSAL | Status: AC
  Administered 2021-08-18: 15 mL via OROMUCOSAL
  Filled 2021-08-18: qty 15

## 2021-08-18 MED ORDER — 0.9 % SODIUM CHLORIDE (POUR BTL) OPTIME
TOPICAL | Status: DC | PRN
  Administered 2021-08-18: 1000 mL

## 2021-08-18 MED ORDER — FENTANYL CITRATE (PF) 250 MCG/5ML IJ SOLN
INTRAMUSCULAR | Status: AC
  Filled 2021-08-18: qty 5

## 2021-08-18 MED ORDER — HEPARIN SODIUM (PORCINE) 5000 UNIT/ML IJ SOLN
5000.0000 [IU] | Freq: Three times a day (TID) | INTRAMUSCULAR | Status: DC
  Administered 2021-08-19: 5000 [IU] via SUBCUTANEOUS
  Filled 2021-08-18: qty 1

## 2021-08-18 MED ORDER — EPHEDRINE SULFATE-NACL 50-0.9 MG/10ML-% IV SOSY
PREFILLED_SYRINGE | INTRAVENOUS | Status: DC | PRN
  Administered 2021-08-18: 5 mg via INTRAVENOUS
  Administered 2021-08-18: 10 mg via INTRAVENOUS

## 2021-08-18 MED ORDER — ACETAMINOPHEN 500 MG PO TABS: 1000.0000 mg | ORAL_TABLET | Freq: Once | ORAL | Status: DC

## 2021-08-18 MED ORDER — METOPROLOL TARTRATE 5 MG/5ML IV SOLN
3.0000 mg | Freq: Once | INTRAVENOUS | Status: AC
  Administered 2021-08-18: 3 mg via INTRAVENOUS

## 2021-08-18 MED ORDER — ALPRAZOLAM ER 1 MG PO TB24
2.0000 mg | ORAL_TABLET | Freq: Every day | ORAL | Status: DC
  Administered 2021-08-18: 2 mg via ORAL
  Filled 2021-08-18: qty 1

## 2021-08-18 MED ORDER — SODIUM CHLORIDE 0.9 % IV SOLN: INTRAVENOUS | Status: DC

## 2021-08-18 MED ORDER — FENTANYL CITRATE (PF) 100 MCG/2ML IJ SOLN: 100.0000 ug | Freq: Once | INTRAMUSCULAR | Status: AC

## 2021-08-18 MED ORDER — MIDAZOLAM HCL 2 MG/2ML IJ SOLN
INTRAMUSCULAR | Status: AC
  Administered 2021-08-18: 2 mg via INTRAVENOUS
  Filled 2021-08-18: qty 2

## 2021-08-18 MED ORDER — MORPHINE SULFATE (PF) 2 MG/ML IV SOLN
1.0000 mg | INTRAVENOUS | Status: DC | PRN
  Administered 2021-08-18: 1 mg via INTRAVENOUS
  Filled 2021-08-18: qty 1

## 2021-08-18 MED ORDER — MIDAZOLAM HCL 5 MG/5ML IJ SOLN
INTRAMUSCULAR | Status: DC | PRN
  Administered 2021-08-18: 2 mg via INTRAVENOUS

## 2021-08-18 MED ORDER — AMISULPRIDE (ANTIEMETIC) 5 MG/2ML IV SOLN: 10.0000 mg | Freq: Once | INTRAVENOUS | Status: DC | PRN

## 2021-08-18 MED ORDER — MIDAZOLAM HCL 2 MG/2ML IJ SOLN
INTRAMUSCULAR | Status: AC
  Filled 2021-08-18: qty 2

## 2021-08-18 MED ORDER — PANTOPRAZOLE SODIUM 40 MG IV SOLR
40.0000 mg | Freq: Every day | INTRAVENOUS | Status: DC
  Administered 2021-08-18: 40 mg via INTRAVENOUS
  Filled 2021-08-18: qty 40

## 2021-08-18 MED ORDER — DEXAMETHASONE SODIUM PHOSPHATE 10 MG/ML IJ SOLN
INTRAMUSCULAR | Status: DC | PRN
  Administered 2021-08-18: 10 mg via INTRAVENOUS

## 2021-08-18 MED ORDER — ROCURONIUM BROMIDE 10 MG/ML (PF) SYRINGE
PREFILLED_SYRINGE | INTRAVENOUS | Status: AC
  Filled 2021-08-18: qty 10

## 2021-08-18 MED ORDER — FENTANYL CITRATE (PF) 100 MCG/2ML IJ SOLN
INTRAMUSCULAR | Status: AC
  Administered 2021-08-18: 50 ug via INTRAVENOUS
  Filled 2021-08-18: qty 2

## 2021-08-18 MED ORDER — DIPHENHYDRAMINE HCL 50 MG/ML IJ SOLN
INTRAMUSCULAR | Status: DC | PRN
  Administered 2021-08-18: 6.25 mg via INTRAVENOUS

## 2021-08-18 MED ORDER — PHENYLEPHRINE HCL (PRESSORS) 10 MG/ML IV SOLN
INTRAVENOUS | Status: DC | PRN
  Administered 2021-08-18: 120 ug via INTRAVENOUS

## 2021-08-18 MED ORDER — GABAPENTIN 300 MG PO CAPS: 300.0000 mg | ORAL_CAPSULE | ORAL | Status: AC

## 2021-08-18 MED ORDER — CELECOXIB 200 MG PO CAPS
ORAL_CAPSULE | ORAL | Status: AC
  Administered 2021-08-18: 200 mg via ORAL
  Filled 2021-08-18: qty 1

## 2021-08-18 MED ORDER — CEFAZOLIN SODIUM-DEXTROSE 2-4 GM/100ML-% IV SOLN
2.0000 g | INTRAVENOUS | Status: AC
  Administered 2021-08-18: 2 g via INTRAVENOUS

## 2021-08-18 MED ORDER — PROMETHAZINE HCL 25 MG/ML IJ SOLN
INTRAMUSCULAR | Status: AC
  Administered 2021-08-18: 12.5 mg via INTRAVENOUS
  Filled 2021-08-18: qty 1

## 2021-08-18 MED ORDER — ACETAMINOPHEN 500 MG PO TABS
ORAL_TABLET | ORAL | Status: AC
  Filled 2021-08-18: qty 2

## 2021-08-18 MED ORDER — CHLORHEXIDINE GLUCONATE 0.12 % MT SOLN
15.0000 mL | Freq: Once | OROMUCOSAL | Status: AC
Start: 2021-08-18 — End: 2021-08-18

## 2021-08-18 MED ORDER — FENTANYL CITRATE (PF) 100 MCG/2ML IJ SOLN
INTRAMUSCULAR | Status: DC | PRN
  Administered 2021-08-18 (×2): 25 ug via INTRAVENOUS
  Administered 2021-08-18: 100 ug via INTRAVENOUS
  Administered 2021-08-18: 50 ug via INTRAVENOUS

## 2021-08-18 MED ORDER — CITALOPRAM HYDROBROMIDE 20 MG PO TABS: 40.0000 mg | ORAL_TABLET | Freq: Every day | ORAL | Status: DC

## 2021-08-18 MED ORDER — CEFAZOLIN SODIUM-DEXTROSE 2-4 GM/100ML-% IV SOLN
INTRAVENOUS | Status: AC
  Filled 2021-08-18: qty 100

## 2021-08-18 MED ORDER — BUPIVACAINE LIPOSOME 1.3 % IJ SUSP
INTRAMUSCULAR | Status: DC | PRN
  Administered 2021-08-18 (×2): 10 mL via PERINEURAL

## 2021-08-18 MED ORDER — PROPOFOL 10 MG/ML IV BOLUS
INTRAVENOUS | Status: DC | PRN
  Administered 2021-08-18: 50 mg via INTRAVENOUS
  Administered 2021-08-18: 150 mg via INTRAVENOUS

## 2021-08-18 MED ORDER — ONDANSETRON HCL 4 MG/2ML IJ SOLN
INTRAMUSCULAR | Status: DC | PRN
  Administered 2021-08-18: 4 mg via INTRAVENOUS

## 2021-08-18 MED ORDER — GABAPENTIN 300 MG PO CAPS
ORAL_CAPSULE | ORAL | Status: AC
  Administered 2021-08-18: 300 mg via ORAL
  Filled 2021-08-18: qty 1

## 2021-08-18 SURGICAL SUPPLY — 44 items
APPLIER CLIP 9.375 MED OPEN (MISCELLANEOUS) ×9
BAG COUNTER SPONGE SURGICOUNT (BAG) ×3 IMPLANT
BINDER BREAST XLRG (GAUZE/BANDAGES/DRESSINGS) ×3 IMPLANT
BIOPATCH RED 1 DISK 7.0 (GAUZE/BANDAGES/DRESSINGS) ×6 IMPLANT
CANISTER SUCT 3000ML PPV (MISCELLANEOUS) ×6 IMPLANT
CHLORAPREP W/TINT 26 (MISCELLANEOUS) ×3 IMPLANT
CLIP APPLIE 9.375 MED OPEN (MISCELLANEOUS) ×6 IMPLANT
CNTNR URN SCR LID CUP LEK RST (MISCELLANEOUS) ×2 IMPLANT
CONT SPEC 4OZ STRL OR WHT (MISCELLANEOUS) ×1
COVER PROBE W GEL 5X96 (DRAPES) ×3 IMPLANT
COVER SURGICAL LIGHT HANDLE (MISCELLANEOUS) ×3 IMPLANT
DERMABOND ADVANCED (GAUZE/BANDAGES/DRESSINGS) ×4
DERMABOND ADVANCED .7 DNX12 (GAUZE/BANDAGES/DRESSINGS) ×8 IMPLANT
DEVICE DSSCT PLSMBLD 3.0S LGHT (MISCELLANEOUS) ×2 IMPLANT
DRAIN CHANNEL 19F RND (DRAIN) ×6 IMPLANT
DRAPE ORTHO SPLIT 77X108 STRL (DRAPES) ×2
DRAPE SURG ORHT 6 SPLT 77X108 (DRAPES) ×4 IMPLANT
DRSG PAD ABDOMINAL 8X10 ST (GAUZE/BANDAGES/DRESSINGS) ×12 IMPLANT
DRSG TEGADERM 4X4.75 (GAUZE/BANDAGES/DRESSINGS) ×6 IMPLANT
ELECT REM PT RETURN 9FT ADLT (ELECTROSURGICAL) ×3
ELECTRODE REM PT RTRN 9FT ADLT (ELECTROSURGICAL) ×2 IMPLANT
EVACUATOR SILICONE 100CC (DRAIN) ×6 IMPLANT
GAUZE SPONGE 4X4 12PLY STRL (GAUZE/BANDAGES/DRESSINGS) ×6 IMPLANT
GOWN STRL REUS W/ TWL LRG LVL3 (GOWN DISPOSABLE) ×4 IMPLANT
GOWN STRL REUS W/TWL LRG LVL3 (GOWN DISPOSABLE) ×2
KIT BASIN OR (CUSTOM PROCEDURE TRAY) ×3 IMPLANT
KIT TURNOVER KIT B (KITS) ×3 IMPLANT
NEEDLE 18GX1X1/2 (RX/OR ONLY) (NEEDLE) ×3 IMPLANT
NEEDLE HYPO 25GX1X1/2 BEV (NEEDLE) ×3 IMPLANT
NS IRRIG 1000ML POUR BTL (IV SOLUTION) ×3 IMPLANT
PACK GENERAL/GYN (CUSTOM PROCEDURE TRAY) ×3 IMPLANT
PAD ARMBOARD 7.5X6 YLW CONV (MISCELLANEOUS) ×3 IMPLANT
PLASMABLADE 3.0S W/LIGHT (MISCELLANEOUS) ×3
SPECIMEN JAR X LARGE (MISCELLANEOUS) ×6 IMPLANT
SUT ETHILON 3 0 FSL (SUTURE) ×6 IMPLANT
SUT MNCRL AB 4-0 PS2 18 (SUTURE) ×12 IMPLANT
SUT VIC AB 3-0 54X BRD REEL (SUTURE) IMPLANT
SUT VIC AB 3-0 BRD 54 (SUTURE)
SUT VIC AB 3-0 SH 18 (SUTURE) ×6 IMPLANT
SYR CONTROL 10ML LL (SYRINGE) ×3 IMPLANT
TOWEL GREEN STERILE (TOWEL DISPOSABLE) ×3 IMPLANT
TOWEL GREEN STERILE FF (TOWEL DISPOSABLE) ×3 IMPLANT
TRACER MAGTRACE VIAL (MISCELLANEOUS) ×3 IMPLANT
TUBE CONNECTING 12X1/4 (SUCTIONS) ×3 IMPLANT

## 2021-08-18 NOTE — Transfer of Care (Signed)
Immediate Anesthesia Transfer of Care Note  Patient: Katelyn Hobbs  Procedure(s) Performed: RIGHT MASTECTOMY WITH RIGHT SENTINEL LYMPH NODE BIOPSY (Right: Breast) LEFT TOTAL MASTECTOMY (Left: Breast)  Patient Location: PACU  Anesthesia Type:General  Level of Consciousness: drowsy and patient cooperative  Airway & Oxygen Therapy: Patient Spontanous Breathing and Patient connected to nasal cannula oxygen  Post-op Assessment: Report given to RN and Post -op Vital signs reviewed and stable  Post vital signs: Reviewed and stable  Last Vitals:  Vitals Value Taken Time  BP 152/57 08/18/21 1750  Temp 36.1 C 08/18/21 1750  Pulse 130 08/18/21 1753  Resp 18 08/18/21 1753  SpO2 95 % 08/18/21 1753  Vitals shown include unvalidated device data.  Last Pain:  Vitals:   08/18/21 1750  TempSrc:   PainSc: 5          Complications: No notable events documented.

## 2021-08-18 NOTE — Op Note (Signed)
08/18/2021  5:28 PM  PATIENT:  Katelyn Hobbs  45 y.o. female  PRE-OPERATIVE DIAGNOSIS:  RIGHT BREAST CANCER  POST-OPERATIVE DIAGNOSIS:  RIGHT BREAST CANCER  PROCEDURE:  Procedure(s): RIGHT MASTECTOMY WITH RIGHT SENTINEL LYMPH NODE BIOPSY (Right) LEFT TOTAL MASTECTOMY (Left)  SURGEON:  Surgeon(s) and Role:    * Jovita Kussmaul, MD - Primary  PHYSICIAN ASSISTANT:   ASSISTANTS: Ewell Poe, RNFA   ANESTHESIA:   general  EBL:  minimal   BLOOD ADMINISTERED:none  DRAINS: (2) Blake drain(s) in the prepectoral space    LOCAL MEDICATIONS USED:  NONE  SPECIMEN:  Source of Specimen:  right mastectomy and sentinel node x 3, left mastectomy  DISPOSITION OF SPECIMEN:  PATHOLOGY  COUNTS:  YES  TOURNIQUET:  * No tourniquets in log *  DICTATION: .Dragon Dictation  After informed consent was obtained the patient was brought to the operating room and placed in the supine position on the operating table.  After adequate induction of general anesthesia 2 cc of mag trace were injected in the subareolar plexus of the left breast after an appropriate timeout.  Next the patient's bilateral chest, breast, and axillary areas were prepped with ChloraPrep, allowed to dry, and draped in usual sterile manner.  An appropriate timeout was performed again.  Attention was first turned to the right breast.  Earlier in the day the patient underwent injection of 1 mCi of technetium sulfur colloid in the subareolar position on the right.  The neoprobe was set to technetium and an area of radioactivity was readily identified in the right axilla.  An elliptical incision was then made around the nipple and areolar complex of the right breast in order to minimize the excess skin.  The incision was carried through the skin and subcutaneous tissue sharply with the PlasmaBlade.  Breast hooks were used to elevate the skin flaps anteriorly towards the ceiling.  Thin skin flaps were then created by dissecting between  the breast tissue and the subcutaneous fat and skin.  This dissection was carried circumferentially all the way to the chest wall.  Next the breast was removed from the pectoralis muscle with the pectoralis fascia.  Once this was accomplished the entire right breast was removed.  It was oriented with a stitch on the lateral skin and sent to pathology for further evaluation.  Dissection was then carried into the deep right axillary space under the direction of the neoprobe.  I was able to identify 3 hot lymph nodes.  Each of these were excised sharply with the PlasmaBlade and the surrounding small vessels and lymphatics were controlled with clips.  Ex vivo counts on these nodes ranged from 200 to 1500.  No other hot or palpable nodes were identified in the right axilla.  Hemostasis was achieved using the PlasmaBlade.  The wound was irrigated with copious amounts of saline.  A small stab incision was made near the anterior axillary line inferior to the operative bed.  A tonsil clamp was placed through this opening and used to bring a 19 [place] round Blake drain into the operative bed.  The drain was anchored to the skin with a 3-0 nylon stitch.  The drain was curled along the chest wall.  Next the superior and inferior skin flaps were grossly reapproximated with interrupted 3-0 Vicryl stitches.  The skin was then closed with a running 4-0 Monocryl subcuticular stitch.  Attention was then turned to the left breast.  A similar elliptical incision was made around the nipple  and areola complex in order to minimize the excess skin.  The incision was carried through the skin and subcutaneous tissue sharply with the PlasmaBlade.  Breast hooks were used to elevate the skin flaps anteriorly towards the ceiling.  Thin skin flaps were then created by dissecting between the breast tissue and the subcutaneous fat and skin.  This dissection was carried circumferentially all the way to the chest wall.  Next the breast was removed  from the pectoralis muscle with the pectoralis fashion.  Once the breast was removed it was oriented with a stitch on the lateral skin and sent to pathology for further evaluation.  Hemostasis was achieved using the PlasmaBlade.  The wound was irrigated with copious amounts of saline.  A small stab incision was made near the anterior axillary line inferior to the operative bed.  A tonsil clamp was placed through this opening and used to bring a 19 [place] round Blake drain into the operative bed.  The drain was anchored to the skin with a 3-0 nylon stitch.  The drain was curled along the chest wall.  The superior and inferior skin flaps were then grossly reapproximated with interrupted 3-0 Vicryl stitches.  The skin was closed with a running 4-0 Monocryl subcuticular stitch.  Dermabond dressings were then applied.  As well as sterile drain dressings.  The drains were placed to bulb suction and there was a good seal.  The patient tolerated the procedure well.  At the end of the case all needle sponge and instrument counts were correct.  The patient was then awakened and taken recovery in stable condition.  PLAN OF CARE: Admit for overnight observation  PATIENT DISPOSITION:  PACU - hemodynamically stable.   Delay start of Pharmacological VTE agent (>24hrs) due to surgical blood loss or risk of bleeding: no

## 2021-08-18 NOTE — Interval H&P Note (Signed)
History and Physical Interval Note:  08/18/2021 2:31 PM  Katelyn Hobbs  has presented today for surgery, with the diagnosis of RIGHT BREAST CANCER.  The various methods of treatment have been discussed with the patient and family. After consideration of risks, benefits and other options for treatment, the patient has consented to  Procedure(s): RIGHT MASTECTOMY WITH RIGHT SENTINEL LYMPH NODE BIOPSY (Right) LEFT TOTAL MASTECTOMY (Left) as a surgical intervention.  The patient's history has been reviewed, patient examined, no change in status, stable for surgery.  I have reviewed the patient's chart and labs.  Questions were answered to the patient's satisfaction.     [name]

## 2021-08-18 NOTE — Anesthesia Procedure Notes (Signed)
Anesthesia Regional Block: Pectoralis block   Pre-Anesthetic Checklist: , timeout performed,  Correct Patient, Correct Site, Correct Laterality,  Correct Procedure, Correct Position, site marked,  Risks and benefits discussed,  Pre-op evaluation,  At surgeon's request and post-op pain management  Laterality: Left  Prep: Maximum Sterile Barrier Precautions used, chloraprep       Needles:  Injection technique: Single-shot  Needle Type: Echogenic Stimulator Needle     Needle Length: 9cm  Needle Gauge: 22     Additional Needles:   Procedures:,,,, ultrasound used (permanent image in chart),,    Narrative:  Start time: 08/18/2021 1:55 PM End time: 08/18/2021 1:58 PM Injection made incrementally with aspirations every 5 mL.  Performed by: Personally  Anesthesiologist: Brennan Bailey, MD  Additional Notes: Risks, benefits, and alternative discussed. Patient gave consent for procedure. Patient prepped and draped in sterile fashion. Sedation administered, patient remains easily responsive to voice. Relevant anatomy identified with ultrasound guidance. Local anesthetic given in 5cc increments with no signs or symptoms of intravascular injection. No pain or paraesthesias with injection. Patient monitored throughout procedure with signs of LAST or immediate complications. Tolerated well. Ultrasound image placed in chart.  Tawny Asal, MD

## 2021-08-18 NOTE — Anesthesia Procedure Notes (Signed)
Anesthesia Regional Block: Pectoralis block   Pre-Anesthetic Checklist: , timeout performed,  Correct Patient, Correct Site, Correct Laterality,  Correct Procedure, Correct Position, site marked,  Risks and benefits discussed,  Pre-op evaluation,  At surgeon's request and post-op pain management  Laterality: Right  Prep: Maximum Sterile Barrier Precautions used, chloraprep       Needles:  Injection technique: Single-shot  Needle Type: Echogenic Stimulator Needle     Needle Length: 9cm  Needle Gauge: 22     Additional Needles:   Procedures:,,,, ultrasound used (permanent image in chart),,    Narrative:  Start time: 08/18/2021 1:53 PM End time: 08/18/2021 1:55 PM Injection made incrementally with aspirations every 5 mL.  Performed by: Personally  Anesthesiologist: Brennan Bailey, MD  Additional Notes: Risks, benefits, and alternative discussed. Patient gave consent for procedure. Patient prepped and draped in sterile fashion. Sedation administered, patient remains easily responsive to voice. Relevant anatomy identified with ultrasound guidance. Local anesthetic given in 5cc increments with no signs or symptoms of intravascular injection. No pain or paraesthesias with injection. Patient monitored throughout procedure with signs of LAST or immediate complications. Tolerated well. Ultrasound image placed in chart.  Tawny Asal, MD

## 2021-08-18 NOTE — Anesthesia Procedure Notes (Signed)
Procedure Name: Intubation Date/Time: 08/18/2021 1:30 PM Performed by: Terrence Dupont, CRNA Pre-anesthesia Checklist: Patient identified, Emergency Drugs available, Suction available and Patient being monitored Patient Re-evaluated:Patient Re-evaluated prior to induction Oxygen Delivery Method: Circle system utilized Preoxygenation: Pre-oxygenation with 100% oxygen Induction Type: IV induction Ventilation: Mask ventilation without difficulty Laryngoscope Size: Mac and 4 Grade View: Grade I Tube type: Oral Tube size: 7.0 mm Number of attempts: 1 Airway Equipment and Method: Stylet and Oral airway Placement Confirmation: ETT inserted through vocal cords under direct vision, positive ETCO2 and breath sounds checked- equal and bilateral Tube secured with: Tape Dental Injury: Teeth and Oropharynx as per pre-operative assessment

## 2021-08-18 NOTE — H&P (Signed)
PROVIDER: Landry Corporal, MD  MRN: [id number] DOB: 01/19/1976 Subjective   Chief Complaint: No chief complaint on file.   History of Present Illness: Katelyn Hobbs is a 45 y.o. female who is seen today for right breast cancer. The patient is a 45 year old white female who has a known 2.6 cm right breast cancer with clinically negative nodes. The cancer is weakly ER positive and PR negative and HER2 negative with a Ki-67 of 60%. She is scheduled for bilateral mastectomies and a right sentinel lymph node biopsy on Monday. She did have some calcifications seen in the left breast on MRI that have not been biopsied. We will plan to inject mag trace in the left breast on Monday at the time of surgery in case we have to come back and mapped the nodes at a later date. She returns today having some pain at the cancer site.  Review of Systems: A complete review of systems was obtained from the patient. I have reviewed this information and discussed as appropriate with the patient. See HPI as well for other ROS.  ROS   Medical History: Past Medical History:  Diagnosis Date   Anemia   Anxiety   Depression   Patient Active Problem List  Diagnosis   Malignant neoplasm of upper-outer quadrant of right breast in female, estrogen receptor positive (CMS-HCC)   Mass of breast, right   Past Surgical History:  Procedure Laterality Date   HYSTERECTOMY N/A  Partial 2016    Allergies  Allergen Reactions   Latex Dermatitis   Current Outpatient Medications on File Prior to Visit  Medication Sig Dispense Refill   ALPRAZolam (XANAX) 1 MG tablet TAKE 1 TABLET BY MOUTH TWICE DAILY AS NEEDED FOR ANXIETY   brompheniramine-pseudoephed-DM (BROMFED DM) 2-30-10 mg/5 mL syrup Take 10 mLs by mouth every 6 (six) hours as needed for Rhinitis (Patient not taking: Reported on 01/10/2020 ) 118 mL 0   cholecalciferol, vitamin D3, (VITAMIN D3 ORAL) Take by mouth.   citalopram (CELEXA) 20 MG tablet TAKE 1 TABLET BY  MOUTH DAILY   HYDROcodone-acetaminophen (NORCO) 5-325 mg tablet Take one tablet at night for pain; may take up to every 6 hours as needed for pain if not working or driving 20 tablet 0   metaxalone (SKELAXIN) 800 mg tablet Take 1 tablet (800 mg total) by mouth 3 (three) times daily 30 tablet 0   No current facility-administered medications on file prior to visit.   Family History  Problem Relation Age of Onset   High blood pressure (Hypertension) Father   Hyperlipidemia (Elevated cholesterol) Father   Diabetes Father   High blood pressure (Hypertension) Sister    Social History   Tobacco Use  Smoking Status Never Smoker  Smokeless Tobacco Never Used    Social History   Socioeconomic History   Marital status: Single  Tobacco Use   Smoking status: Never Smoker   Smokeless tobacco: Never Used  Scientific laboratory technician Use: Never used  Substance and Sexual Activity   Alcohol use: Defer   Drug use: Defer   Objective:   There were no vitals filed for this visit.  There is no height or weight on file to calculate BMI.  Physical Exam Vitals reviewed.  Constitutional:  General: She is not in acute distress. Appearance: Normal appearance.  HENT:  Head: Normocephalic and atraumatic.  Right Ear: External ear normal.  Left Ear: External ear normal.  Nose: Nose normal.  Mouth/Throat:  Mouth: Mucous membranes are moist.  Pharynx: Oropharynx is clear.  Eyes:  General: No scleral icterus. Extraocular Movements: Extraocular movements intact.  Conjunctiva/sclera: Conjunctivae normal.  Pupils: Pupils are equal, round, and reactive to light.  Cardiovascular:  Rate and Rhythm: Normal rate and regular rhythm.  Pulses: Normal pulses.  Heart sounds: Normal heart sounds.  Pulmonary:  Effort: Pulmonary effort is normal. No respiratory distress.  Breath sounds: Normal breath sounds.  Abdominal:  General: Bowel sounds are normal.  Palpations: Abdomen is soft.  Tenderness: There is no  abdominal tenderness.  Musculoskeletal:  General: No swelling, tenderness or deformity. Normal range of motion.  Cervical back: Normal range of motion and neck supple.  Skin: General: Skin is warm and dry.  Coloration: Skin is not jaundiced.  Neurological:  General: No focal deficit present.  Mental Status: She is alert and oriented to person, place, and time.  Psychiatric:  Mood and Affect: Mood normal.  Behavior: Behavior normal.     Breast: There is a 2 to 3 cm palpable mass in the upper outer quadrant of the right breast. There is no sign of hematoma or infection. There is no palpable mass in the left breast. There is no palpable axillary, supraclavicular, or cervical lymphadenopathy.  Labs, Imaging and Diagnostic Testing:  Assessment and Plan:  Diagnoses and all orders for this visit:  Malignant neoplasm of upper-outer quadrant of right breast in female, estrogen receptor positive (CMS-HCC)    The patient has a known right breast cancer and is scheduled for right mastectomy and sentinel node biopsy as well as a left prophylactic mastectomy. She never had the calcifications in the left breast biopsied so we will plan to inject mag trace in the left breast at the time of surgery in case the nodes need to be mapped at a later date. All questions were answered today. We will plan for surgery Monday

## 2021-08-18 NOTE — Anesthesia Postprocedure Evaluation (Signed)
Anesthesia Post Note  Patient: ROZLYN YERBY  Procedure(s) Performed: RIGHT MASTECTOMY WITH RIGHT SENTINEL LYMPH NODE BIOPSY (Right: Breast) LEFT TOTAL MASTECTOMY (Left: Breast)     Patient location during evaluation: PACU Anesthesia Type: General Level of consciousness: awake and alert, patient cooperative and oriented Pain management: pain level controlled (pain improving) Vital Signs Assessment: post-procedure vital signs reviewed and stable Respiratory status: spontaneous breathing, nonlabored ventilation and respiratory function stable Cardiovascular status: blood pressure returned to baseline and stable Postop Assessment: no apparent nausea or vomiting Anesthetic complications: no   No notable events documented.  Last Vitals:  Vitals:   08/18/21 1834 08/18/21 1849  BP: (!) 147/76 (!) 149/80  Pulse: (!) 119 (!) 107  Resp: 17 19  Temp:    SpO2: 96% 96%    Last Pain:  Vitals:   08/18/21 1849  TempSrc:   PainSc: 4                  Ardenia Stiner,E. Khalee Mazo

## 2021-08-18 NOTE — Anesthesia Preprocedure Evaluation (Addendum)
Anesthesia Evaluation  Patient identified by MRN, date of birth, ID band Patient awake    Reviewed: Allergy & Precautions, NPO status , Patient's Chart, lab work & pertinent test results  History of Anesthesia Complications Negative for: history of anesthetic complications  Airway Mallampati: II  TM Distance: >3 FB Neck ROM: Full    Dental no notable dental hx. (+) Dental Advisory Given   Pulmonary neg pulmonary ROS, former smoker,    Pulmonary exam normal        Cardiovascular negative cardio ROS Normal cardiovascular exam     Neuro/Psych  Headaches, PSYCHIATRIC DISORDERS Anxiety Depression    GI/Hepatic negative GI ROS, Neg liver ROS,   Endo/Other  negative endocrine ROS  Renal/GU negative Renal ROS     Musculoskeletal negative musculoskeletal ROS (+)   Abdominal   Peds  Hematology negative hematology ROS (+)   Anesthesia Other Findings   Reproductive/Obstetrics PCOS                            Anesthesia Physical Anesthesia Plan  ASA: 2  Anesthesia Plan: General   Post-op Pain Management: GA combined w/ Regional for post-op pain   Induction: Intravenous  PONV Risk Score and Plan: 4 or greater and Ondansetron, Dexamethasone, Midazolam and Scopolamine patch - Pre-op  Airway Management Planned: LMA  Additional Equipment:   Intra-op Plan:   Post-operative Plan: Extubation in OR  Informed Consent: I have reviewed the patients History and Physical, chart, labs and discussed the procedure including the risks, benefits and alternatives for the proposed anesthesia with the patient or authorized representative who has indicated his/her understanding and acceptance.     Dental advisory given  Plan Discussed with: Anesthesiologist, CRNA and Surgeon  Anesthesia Plan Comments:        Anesthesia Quick Evaluation

## 2021-08-19 ENCOUNTER — Encounter (HOSPITAL_COMMUNITY): Payer: Self-pay | Admitting: General Surgery

## 2021-08-19 DIAGNOSIS — Z87891 Personal history of nicotine dependence: Secondary | ICD-10-CM | POA: Diagnosis not present

## 2021-08-19 DIAGNOSIS — C50911 Malignant neoplasm of unspecified site of right female breast: Secondary | ICD-10-CM | POA: Diagnosis not present

## 2021-08-19 DIAGNOSIS — Z79899 Other long term (current) drug therapy: Secondary | ICD-10-CM | POA: Diagnosis not present

## 2021-08-19 DIAGNOSIS — Z91040 Latex allergy status: Secondary | ICD-10-CM | POA: Diagnosis not present

## 2021-08-19 DIAGNOSIS — N6012 Diffuse cystic mastopathy of left breast: Secondary | ICD-10-CM | POA: Diagnosis not present

## 2021-08-19 DIAGNOSIS — C50411 Malignant neoplasm of upper-outer quadrant of right female breast: Secondary | ICD-10-CM | POA: Diagnosis not present

## 2021-08-19 DIAGNOSIS — Z170 Estrogen receptor positive status [ER+]: Secondary | ICD-10-CM | POA: Diagnosis not present

## 2021-08-19 MED ORDER — HYDROCODONE-ACETAMINOPHEN 5-325 MG PO TABS: 1.0000 | ORAL_TABLET | Freq: Four times a day (QID) | ORAL | 0 refills | Status: DC | PRN

## 2021-08-19 MED ORDER — METHOCARBAMOL 500 MG PO TABS: 500.0000 mg | ORAL_TABLET | Freq: Four times a day (QID) | ORAL | 2 refills | Status: DC | PRN

## 2021-08-19 MED ORDER — ONDANSETRON 4 MG PO TBDP: 4.0000 mg | ORAL_TABLET | Freq: Four times a day (QID) | ORAL | 0 refills | Status: DC | PRN

## 2021-08-19 NOTE — Progress Notes (Signed)
At request of pts primary nurse, Tammy, took a breast cancer bag to pt and reviewed its contents and home care instructions. Pt has JP drains and understands how to empty them  and record them. Pt understands to take the JP drain record back to the doctor with her.

## 2021-08-19 NOTE — Plan of Care (Signed)
  Problem: Activity: Goal: Ability to maintain or regain function will improve Outcome: Progressing   Problem: Clinical Measurements: Goal: Postoperative complications will be avoided or minimized Outcome: Progressing   Problem: Pain Management: Goal: Expressions of feelings of enhanced comfort will increase Outcome: Progressing   Problem: Skin Integrity: Goal: Demonstration of wound healing without infection will improve Outcome: Progressing   Problem: Clinical Measurements: Goal: Ability to maintain clinical measurements within normal limits will improve Outcome: Progressing Goal: Will remain free from infection Outcome: Progressing Goal: Diagnostic test results will improve Outcome: Progressing Goal: Respiratory complications will improve Outcome: Progressing Goal: Cardiovascular complication will be avoided Outcome: Progressing

## 2021-08-19 NOTE — Progress Notes (Signed)
1 Day Post-Op   Subjective/Chief Complaint: No complaints   Objective: Vital signs in last 24 hours: Temp:  [97 F (36.1 C)-99.4 F (37.4 C)] 99.4 F (37.4 C) (10/25 0500) Pulse Rate:  [76-136] 76 (10/25 0500) Resp:  [15-25] 16 (10/25 0500) BP: (103-162)/(57-104) 103/61 (10/25 0500) SpO2:  [90 %-99 %] 97 % (10/25 0500) Weight:  [78 kg] 78 kg (10/24 1135)    Intake/Output from previous day: 10/24 0701 - 10/25 0700 In: [place] [P.O.:840; I.V.:1000] Out: 95 [Drains:95] Intake/Output this shift: Total I/O In: 840 [P.O.:840] Out: 95 [Drains:95]  General appearance: alert and cooperative Resp: clear to auscultation bilaterally Chest wall: skin flaps look good Cardio: regular rate and rhythm GI: soft, non-tender; bowel sounds normal; no masses,  no organomegaly  Lab Results:  No results for input(s): WBC, HGB, HCT, PLT in the last 72 hours. BMET No results for input(s): NA, K, CL, CO2, GLUCOSE, BUN, CREATININE, CALCIUM in the last 72 hours. PT/INR No results for input(s): LABPROT, INR in the last 72 hours. ABG No results for input(s): PHART, HCO3 in the last 72 hours.  Invalid input(s): PCO2, PO2  Studies/Results: NM Sentinel Node Inj-No Rpt (Breast)  Result Date: 08/18/2021 Sulfur Colloid was injected by the Nuclear Medicine Technologist for sentinel lymph node localization.    Anti-infectives: Anti-infectives (From admission, onward)    Start     Dose/Rate Route Frequency Ordered Stop   08/18/21 1145  ceFAZolin (ANCEF) IVPB 2g/100 mL premix        2 g 200 mL/hr over 30 Minutes Intravenous On call to O.R. 08/18/21 1135 08/18/21 1525   08/18/21 1141  ceFAZolin (ANCEF) 2-4 GM/100ML-% IVPB       Note to Pharmacy: Alphonsus Sias   : cabinet override      08/18/21 1141 08/18/21 1715       Assessment/Plan: s/p Procedure(s): RIGHT MASTECTOMY WITH RIGHT SENTINEL LYMPH NODE BIOPSY (Right) LEFT TOTAL MASTECTOMY (Left) Advance diet Discharge  LOS: 0 days    [name] 08/19/2021

## 2021-08-19 NOTE — Progress Notes (Signed)
Discharge instructions reviewed with pt and her husband.  Copy of instructions  given to pt. Informed of scripts sent to her home pharmacy. Education on drain and incision care provided and handouts provided. Pt able to return demonstrate care of drains.  Pt d/c'd via wheelchair with belongings, with  her husband.           Escorted by unit staff.

## 2021-08-19 NOTE — Discharge Summary (Signed)
Physician Discharge Summary  Patient ID: Katelyn Hobbs MRN: [id number] DOB/AGE: [birth year] 46 y.o.  Admit date: 08/18/2021 Discharge date: 08/19/2021  Admission Diagnoses:  Discharge Diagnoses:  Active Problems:   Cancer of right female breast [facility])   Discharged Condition: good  Hospital Course: the pt underwent bilateral mastectomies. She tolerated surgery well. On pod 1 she was ready for d/c home  Consults: None  Significant Diagnostic Studies: none  Treatments: surgery: as above  Discharge Exam: Blood pressure 103/61, pulse 76, temperature 99.4 F (37.4 C), temperature source Oral, resp. rate 16, height 5\' 4"  (1.626 m), weight 78 kg, SpO2 97 %. Chest wall: skin flaps look good  Disposition: Discharge disposition: 01-Home or Self Care       Discharge Instructions     Call MD for:  difficulty breathing, headache or visual disturbances   Complete by: As directed    Call MD for:  extreme fatigue   Complete by: As directed    Call MD for:  hives   Complete by: As directed    Call MD for:  persistant dizziness or light-headedness   Complete by: As directed    Call MD for:  persistant nausea and vomiting   Complete by: As directed    Call MD for:  redness, tenderness, or signs of infection (pain, swelling, redness, odor or green/yellow discharge around incision site)   Complete by: As directed    Call MD for:  severe uncontrolled pain   Complete by: As directed    Call MD for:  temperature >100.4   Complete by: As directed    Diet - low sodium heart healthy   Complete by: As directed    Discharge instructions   Complete by: As directed    Sponge bathe while drains are in. No overhead activity. Empty drains, record output, recharge bulb daily   Increase activity slowly   Complete by: As directed    No wound care   Complete by: As directed       Allergies as of 08/19/2021       Reactions   Latex Itching        Medication List     TAKE these  medications    acetaminophen 500 MG tablet Commonly known as: TYLENOL Take 1,000 mg by mouth every 8 (eight) hours as needed for moderate pain.   ALPRAZolam 2 MG 24 hr tablet Commonly known as: XANAX XR TAKE 1 TABLET BY MOUTH EVERYDAY AT BEDTIME   cetirizine 10 MG tablet Commonly known as: ZYRTEC Take 10 mg by mouth at bedtime.   citalopram 40 MG tablet Commonly known as: CELEXA Take 1 tablet (40 mg total) by mouth daily.   diltiazem 30 MG tablet Commonly known as: CARDIZEM TAKE 1 TABLET BY MOUTH AS NEEDED FOR TACHYCARDIA   ergocalciferol 1.25 MG (50000 UT) capsule Commonly known as: Drisdol Take 1 capsule (50,000 Units total) by mouth once a week.   HYDROcodone-acetaminophen 5-325 MG tablet Commonly known as: NORCO/VICODIN Take 1 tablet by mouth every 6 (six) hours as needed for moderate pain.   ibuprofen 200 MG tablet Commonly known as: ADVIL Take 800 mg by mouth every 8 (eight) hours as needed for moderate pain.   methocarbamol 500 MG tablet Commonly known as: ROBAXIN Take 1 tablet (500 mg total) by mouth every 6 (six) hours as needed for muscle spasms.   ondansetron 4 MG disintegrating tablet Commonly known as: ZOFRAN-ODT Take 1 tablet (4 mg total) by mouth every 6 (six)  hours as needed for nausea.        Follow-up Information     [name], MD Follow up in 2 week(s).   Specialty: General Surgery Contact information: [address] [place] [address] [phone]                 Signed: [name] 08/19/2021, 6:27 AM

## 2021-08-26 ENCOUNTER — Encounter: Payer: Self-pay | Admitting: *Deleted

## 2021-08-26 NOTE — Progress Notes (Signed)
Patient Care Team: Crecencio Mc, MD as PCP - General (Internal Medicine) Mauro Kaufmann, RN as Oncology Nurse Navigator Rockwell Germany, RN as Oncology Nurse Navigator Jovita Kussmaul, MD as Consulting Physician (General Surgery) Nicholas Lose, MD as Consulting Physician (Hematology and Oncology) Eppie Gibson, MD as Attending Physician (Radiation Oncology)  DIAGNOSIS:    ICD-10-CM   1. Malignant neoplasm of upper-outer quadrant of right breast in female, estrogen receptor positive ([place])  C50.411 CBC with Differential ([place] Only)   Z17.0 CMP ([place] only)    PHYSICIAN COMMUNICATION ORDER    dexamethasone (DECADRON) 4 MG tablet    ondansetron (ZOFRAN) 8 MG tablet    prochlorperazine (COMPAZINE) 10 MG tablet    LORazepam (ATIVAN) 0.5 MG tablet    lidocaine-prilocaine (EMLA) cream      SUMMARY OF ONCOLOGIC HISTORY: Oncology History  Malignant neoplasm of upper-outer quadrant of right breast in female, estrogen receptor positive ([place])  07/08/2021 Initial Diagnosis   Palpable right breast lump. Diagnostic mammogram: showed highly suspicious right breast mass and an indeterminate single right axillary lymph node with up to 5 mm diffuse cortical thickening. Biopsy: grade 2-3 invasive ductal carcinoma, DCIS, and right axillary lymph node negative for metastatic carcinoma; ER+(80%)/PR-/Her2-.   07/16/2021 Cancer Staging   Staging form: Breast, AJCC 8th Edition - Clinical stage from 07/16/2021: Stage IIB (cT2, cN0, cM0, G3, ER+, PR-, HER2-) - Signed by Nicholas Lose, MD on 07/16/2021 Stage prefix: Initial diagnosis Histologic grading system: 3 grade system    08/18/2021 Surgery   Right mastectomy: Grade 3 IDC 2.8 cm with high-grade DCIS, margins negative, 0/3 lymph nodes negative, ER 0%, PR 0%, HER2 negative, Ki-67 60% (triple negative)     CHIEF COMPLIANT: Follow-up of right breast cancer  INTERVAL HISTORY: Katelyn Hobbs is a 45 y.o. with above-mentioned  history of right breast cancer. Bilateral mastectomies on 08/18/2021 showed grade 3 invasive ductal carcinoma and high-grade DCIS with necrosis in the right breast, and left breast negative for carcinoma. She presents to the clinic today for follow-up.  She is here today to discuss results of the final pathology.  It showed that the final result was a triple negative disease and therefore she is here to discuss systemic chemotherapy options.  She is healing and recovering very well from the recent bilateral mastectomies.  ALLERGIES:  is allergic to latex.  MEDICATIONS:  Current Outpatient Medications  Medication Sig Dispense Refill   acetaminophen (TYLENOL) 500 MG tablet Take 1,000 mg by mouth every 8 (eight) hours as needed for moderate pain.     ALPRAZolam (XANAX XR) 2 MG 24 hr tablet TAKE 1 TABLET BY MOUTH EVERYDAY AT BEDTIME 30 tablet 2   cetirizine (ZYRTEC) 10 MG tablet Take 10 mg by mouth at bedtime.     citalopram (CELEXA) 40 MG tablet Take 1 tablet (40 mg total) by mouth daily. 90 tablet 1   dexamethasone (DECADRON) 4 MG tablet Take 1 tablet (4 mg total) by mouth daily. Take 1 tablet day after chemo and 1 tablet 2 days after chemo with food. 12 tablet 0   diltiazem (CARDIZEM) 30 MG tablet TAKE 1 TABLET BY MOUTH AS NEEDED FOR TACHYCARDIA (Patient not taking: No sig reported) 90 tablet 1   ergocalciferol (DRISDOL) 1.25 MG (50000 UT) capsule Take 1 capsule (50,000 Units total) by mouth once a week. 12 capsule 0   HYDROcodone-acetaminophen (NORCO/VICODIN) 5-325 MG tablet Take 1 tablet by mouth every 6 (six) hours as needed for  moderate pain. 15 tablet 0   ibuprofen (ADVIL) 200 MG tablet Take 800 mg by mouth every 8 (eight) hours as needed for moderate pain.     lidocaine-prilocaine (EMLA) cream Apply to affected area once 30 g 3   LORazepam (ATIVAN) 0.5 MG tablet Take 1 tablet (0.5 mg total) by mouth at bedtime as needed for sleep. 30 tablet 0   methocarbamol (ROBAXIN) 500 MG tablet Take 1  tablet (500 mg total) by mouth every 6 (six) hours as needed for muscle spasms. 30 tablet 2   ondansetron (ZOFRAN) 8 MG tablet Take 1 tablet (8 mg total) by mouth 2 (two) times daily as needed. Start on the third day after chemotherapy. 30 tablet 1   ondansetron (ZOFRAN-ODT) 4 MG disintegrating tablet Take 1 tablet (4 mg total) by mouth every 6 (six) hours as needed for nausea. 20 tablet 0   prochlorperazine (COMPAZINE) 10 MG tablet Take 1 tablet (10 mg total) by mouth every 6 (six) hours as needed (Nausea or vomiting). 30 tablet 1   No current facility-administered medications for this visit.    PHYSICAL EXAMINATION: ECOG PERFORMANCE STATUS: 1 - Symptomatic but completely ambulatory  Vitals:   08/27/21 0943  BP: (!) 108/55  Pulse: 72  Resp: 18  Temp: 97.7 F (36.5 C)  SpO2: 98%   Filed Weights   08/27/21 0943  Weight: 175 lb 11.2 oz (79.7 kg)      LABORATORY DATA:  I have reviewed the data as listed CMP Latest Ref Rng & Units 07/16/2021 01/07/2021 05/17/2018  Glucose 70 - 99 mg/dL 104(H) 92 90  BUN 6 - 20 mg/dL '9 8 7  ' Creatinine 0.44 - 1.00 mg/dL 0.78 0.70 0.73  Sodium 135 - 145 mmol/L 139 138 141  Potassium 3.5 - 5.1 mmol/L 3.7 3.5 3.8  Chloride 98 - 111 mmol/L 102 103 101  CO2 22 - 32 mmol/L '27 27 26  ' Calcium 8.9 - 10.3 mg/dL 9.6 9.4 9.8  Total Protein 6.5 - 8.1 g/dL 7.8 7.2 7.5  Total Bilirubin 0.3 - 1.2 mg/dL 0.5 0.3 <0.2  Alkaline Phos 38 - 126 U/L 55 56 71  AST 15 - 41 U/L 14(L) 12 30  ALT 0 - 44 U/L 14 10 37(H)    Lab Results  Component Value Date   WBC 6.8 08/14/2021   HGB 14.1 08/14/2021   HCT 41.8 08/14/2021   MCV 93.5 08/14/2021   PLT 253 08/14/2021   NEUTROABS 3.9 07/16/2021    ASSESSMENT & PLAN:  Malignant neoplasm of upper-outer quadrant of right breast in female, estrogen receptor positive ([place]) 07/08/2021: Palpable right breast lump. Diagnostic mammogram: showed highly suspicious right breast mass and an indeterminate single right axillary lymph  node with up to 5 mm diffuse cortical thickening. Biopsy: grade 2-3 invasive ductal carcinoma, DCIS, and right axillary lymph node negative for metastatic carcinoma; ER+(80%)/PR-/Her2-.  Recommendations: 1. Right mastectomy: Grade 3 IDC 2.8 cm with high-grade DCIS, margins negative, 0/3 lymph nodes negative, ER 0%, PR 0%, HER2 negative, Ki-67 40% (final pathology is triple negative disease) 2. adjuvant chemotherapy with dose dense Adriamycin and Cytoxan followed by Taxol and carboplatin 3. Genetic testing -------------------------------------------------------------------------------------------------- Pathology counseling: I discussed the final pathology report of the patient provided  a copy of this report. I discussed the margins as well as lymph node surgeries. We also discussed the final staging along with previously performed ER/PR and HER-2/neu testing.  Chemotherapy Counseling: I discussed the risks and benefits of chemotherapy including the risks  of nausea/ vomiting, risk of infection from low WBC count, fatigue due to chemo or anemia, bruising or bleeding due to low platelets, mouth sores, loss/ change in taste and decreased appetite. Liver and kidney function will be monitored through out chemotherapy as abnormalities in liver and kidney function may be a side effect of treatment. Cardiac dysfunction due to Adriamycin and neuropathy risk from Taxol were discussed in detail. Risk of permanent bone marrow dysfunction and leukemia due to chemo were also discussed.  We will plan for echocardiogram, chemo class F/U on first day of chemo.   Orders Placed This Encounter  Procedures   CBC with Differential ([place] Only)    Standing Status:   Standing    Number of Occurrences:   20    Standing Expiration Date:   08/27/2022   CMP ([place] only)    Standing Status:   Standing    Number of Occurrences:   20    Standing Expiration Date:   08/27/2022   PHYSICIAN COMMUNICATION ORDER     A baseline Echo/ [place] should be obtained prior to initiation of Anthracycline Chemotherapy    The patient has a good understanding of the overall plan. she agrees with it. she will call with any problems that may develop before the next visit here.  Total time spent: 30 mins including face to face time and time spent for planning, charting and coordination of care  Rulon Eisenmenger, MD, MPH 08/27/2021  I, Thana Ates, am acting as scribe for Dr. Nicholas Lose.  I have reviewed the above documentation for accuracy and completeness, and I agree with the above.

## 2021-08-26 NOTE — Assessment & Plan Note (Addendum)
07/08/2021: Palpable right breast lump. Diagnostic mammogram: showed highly suspicious right breast mass and an indeterminate single right axillary lymph node with up to 5 mm diffuse cortical thickening. Biopsy: grade 2-3 invasive ductal carcinoma, DCIS, and right axillary lymph node negative for metastatic carcinoma; ER+(80%)/PR-/Her2-.  08/18/2021:Right mastectomy: Grade 3 IDC 2.8 cm with high-grade DCIS, margins negative, 0/3 lymph nodes negative, ER 0%, PR 0%, HER2 negative, Ki-67 60% (triple negative)  Recommendations: 1.  Systemic chemotherapy with dose dense Adriamycin and Cytoxan x4 followed by Taxol and carboplatin  2. Adjuvant radiation therapy followed by -------------------------------------------------------------------------------------------------- Pathology counseling: I discussed the final pathology report of the patient provided  a copy of this report. I discussed the margins as well as lymph node surgeries. We also discussed that the final pathology was triple negative disease   Return to clinic in 3 weeks to start chemotherapy.

## 2021-08-27 ENCOUNTER — Other Ambulatory Visit: Payer: Self-pay

## 2021-08-27 ENCOUNTER — Other Ambulatory Visit: Payer: Self-pay | Admitting: *Deleted

## 2021-08-27 ENCOUNTER — Inpatient Hospital Stay: Payer: BC Managed Care – PPO | Attending: Hematology and Oncology | Admitting: Hematology and Oncology

## 2021-08-27 VITALS — BP 108/55 | HR 72 | Temp 97.7°F | Resp 18 | Ht 64.0 in | Wt 175.7 lb

## 2021-08-27 DIAGNOSIS — Z79899 Other long term (current) drug therapy: Secondary | ICD-10-CM | POA: Diagnosis not present

## 2021-08-27 DIAGNOSIS — Z9011 Acquired absence of right breast and nipple: Secondary | ICD-10-CM | POA: Diagnosis not present

## 2021-08-27 DIAGNOSIS — Z170 Estrogen receptor positive status [ER+]: Secondary | ICD-10-CM | POA: Diagnosis not present

## 2021-08-27 DIAGNOSIS — C50411 Malignant neoplasm of upper-outer quadrant of right female breast: Secondary | ICD-10-CM

## 2021-08-27 DIAGNOSIS — Z171 Estrogen receptor negative status [ER-]: Secondary | ICD-10-CM | POA: Diagnosis not present

## 2021-08-27 DIAGNOSIS — Z5189 Encounter for other specified aftercare: Secondary | ICD-10-CM | POA: Insufficient documentation

## 2021-08-27 DIAGNOSIS — Z5111 Encounter for antineoplastic chemotherapy: Secondary | ICD-10-CM | POA: Insufficient documentation

## 2021-08-27 LAB — SURGICAL PATHOLOGY

## 2021-08-27 MED ORDER — DEXAMETHASONE 4 MG PO TABS: 4.0000 mg | ORAL_TABLET | Freq: Every day | ORAL | 0 refills | Status: DC

## 2021-08-27 MED ORDER — PROCHLORPERAZINE MALEATE 10 MG PO TABS: 10.0000 mg | ORAL_TABLET | Freq: Four times a day (QID) | ORAL | 1 refills | Status: DC | PRN

## 2021-08-27 MED ORDER — LORAZEPAM 0.5 MG PO TABS: 0.5000 mg | ORAL_TABLET | Freq: Every evening | ORAL | 0 refills | Status: DC | PRN

## 2021-08-27 MED ORDER — LIDOCAINE-PRILOCAINE 2.5-2.5 % EX CREA: TOPICAL_CREAM | CUTANEOUS | 3 refills | Status: DC

## 2021-08-27 MED ORDER — ONDANSETRON HCL 8 MG PO TABS: 8.0000 mg | ORAL_TABLET | Freq: Two times a day (BID) | ORAL | 1 refills | Status: DC | PRN

## 2021-08-27 NOTE — Progress Notes (Signed)
START OFF PATHWAY REGIMEN - Breast   [id number] q21 Days C1-4 Followed by Carboplatin AUC=1.5 D1,8,15 + Paclitaxel 80 mg/m2 D1,8,15 q21 Days C5-8:   Cycles 1 through 4: A cycle is every 21 days:     Doxorubicin      Cyclophosphamide    Cycles 5 through 8: A cycle is every 21 days:     Paclitaxel      Carboplatin   **Always confirm dose/schedule in your pharmacy ordering system**  Patient Characteristics: Postoperative without Neoadjuvant Therapy (Pathologic Staging), Invasive Disease, Adjuvant Therapy, HER2 Negative/Unknown/Equivocal, ER Negative/Unknown, Node Negative, pT1a-c, N48m or pT1c or Higher, pN0 Therapeutic Status: Postoperative without Neoadjuvant Therapy (Pathologic Staging) AJCC Grade: G3 AJCC N Category: pN0 AJCC M Category: cM0 ER Status: Negative (-) AJCC 8 Stage Grouping: IIA HER2 Status: Negative (-) Oncotype Dx Recurrence Score: Not Appropriate AJCC T Category: pT2 PR Status: Negative (-) Adjuvant Therapy Status: No Adjuvant Therapy Received Yet or Changing Initial Adjuvant Regimen due to Tolerance Intent of Therapy: Curative Intent, Discussed with Patient

## 2021-08-28 ENCOUNTER — Ambulatory Visit: Payer: BC Managed Care – PPO | Admitting: Hematology and Oncology

## 2021-08-28 ENCOUNTER — Telehealth: Payer: Self-pay | Admitting: *Deleted

## 2021-08-28 ENCOUNTER — Encounter: Payer: Self-pay | Admitting: *Deleted

## 2021-08-28 NOTE — Telephone Encounter (Signed)
Spoke with patient regarding appointments for port echo and chemo education. Confirmed appt for port 11/15, echo 11/18 at [facility] and patient education 11/18 at 11.

## 2021-08-29 ENCOUNTER — Telehealth: Payer: Self-pay | Admitting: Hematology and Oncology

## 2021-08-29 NOTE — Telephone Encounter (Signed)
Sch per 11/4 inbasekt , pt aware

## 2021-09-01 ENCOUNTER — Encounter: Payer: Self-pay | Admitting: *Deleted

## 2021-09-02 ENCOUNTER — Encounter: Payer: Self-pay | Admitting: Hematology and Oncology

## 2021-09-04 NOTE — H&P (Signed)
Chief Complaint: Patient was seen in consultation today for cath placement at the request of [place]  Referring Physician(s): Nicholas Lose  Supervising Physician: Michaelle Birks  Patient Status: [name] - Out-pt  History of Present Illness: Katelyn Hobbs is a 45 y.o. female with PMH of abnormal uterine bleeding, ADHD, anxiety, malignant neoplasm of right breast, endometriosis, HLD, migraine and PCOS.  Patient had mammogram 07/08/2021 that was showed highly suspicious right breast mass.  Biopsy resulted in invasive ductal carcinoma of the right breast. Patient had right breast mastectomy on 08/18/2021.  Patient was referred by Dr. Lindi Adie to IR for Port-A-Cath placement to start chemotherapy.  Past Medical History:  Diagnosis Date   Abnormal uterine bleeding    ADHD (attention deficit hyperactivity disorder)    Anemia    Anxiety    Bacterial vaginosis    Cancer ([place]) 06/2021   breast cancer   Depression    Endometriosis    Family history of breast cancer 07/17/2021   Family history of melanoma 07/17/2021   Family history of prostate cancer 07/17/2021   Family history of uterine cancer 07/17/2021   Hyperlipidemia    Migraine    PCOS (polycystic ovarian syndrome)     Past Surgical History:  Procedure Laterality Date   LAPAROSCOPY  1993   MASTECTOMY W/ SENTINEL NODE BIOPSY Right 08/18/2021   Procedure: RIGHT MASTECTOMY WITH RIGHT SENTINEL LYMPH NODE BIOPSY;  Surgeon: Jovita Kussmaul, MD;  Location: [place];  Service: General;  Laterality: Right;   SALPINGECTOMY Bilateral 2015   TOTAL MASTECTOMY Left 08/18/2021   Procedure: LEFT TOTAL MASTECTOMY;  Surgeon: Jovita Kussmaul, MD;  Location: [place];  Service: General;  Laterality: Left;   VAGINAL HYSTERECTOMY  2017    Allergies: Benadryl [diphenhydramine], Latex, and Phenergan [promethazine hcl]  Medications: Prior to Admission medications   Medication Sig Start Date End Date Taking? Authorizing Provider  acetaminophen  (TYLENOL) 500 MG tablet Take 1,000 mg by mouth every 8 (eight) hours as needed for moderate pain.    [provider]  ALPRAZolam (XANAX XR) 2 MG 24 hr tablet TAKE 1 TABLET BY MOUTH EVERYDAY AT BEDTIME 02/18/21   Ravi, Himabindu, MD  cetirizine (ZYRTEC) 10 MG tablet Take 10 mg by mouth at bedtime.    [provider]  citalopram (CELEXA) 40 MG tablet Take 1 tablet (40 mg total) by mouth daily. 02/18/21 08/18/21  Elvin So, MD  dexamethasone (DECADRON) 4 MG tablet Take 1 tablet (4 mg total) by mouth daily. Take 1 tablet day after chemo and 1 tablet 2 days after chemo with food. 08/27/21   Nicholas Lose, MD  diltiazem (CARDIZEM) 30 MG tablet TAKE 1 TABLET BY MOUTH AS NEEDED FOR TACHYCARDIA Patient not taking: No sig reported 04/30/20   Crecencio Mc, MD  ergocalciferol (DRISDOL) 1.25 MG (50000 UT) capsule Take 1 capsule (50,000 Units total) by mouth once a week. 06/10/21   Crecencio Mc, MD  HYDROcodone-acetaminophen (NORCO/VICODIN) 5-325 MG tablet Take 1 tablet by mouth every 6 (six) hours as needed for moderate pain. 08/19/21   [name], MD  ibuprofen (ADVIL) 200 MG tablet Take 800 mg by mouth every 8 (eight) hours as needed for moderate pain.    [provider]  lidocaine-prilocaine (EMLA) cream Apply to affected area once 08/27/21   Nicholas Lose, MD  LORazepam (ATIVAN) 0.5 MG tablet Take 1 tablet (0.5 mg total) by mouth at bedtime as needed for sleep. 08/27/21   Nicholas Lose, MD  methocarbamol (  ROBAXIN) 500 MG tablet Take 1 tablet (500 mg total) by mouth every 6 (six) hours as needed for muscle spasms. 08/19/21   Jovita Kussmaul, MD  ondansetron (ZOFRAN) 8 MG tablet Take 1 tablet (8 mg total) by mouth 2 (two) times daily as needed. Start on the third day after chemotherapy. 08/27/21   Nicholas Lose, MD  ondansetron (ZOFRAN-ODT) 4 MG disintegrating tablet Take 1 tablet (4 mg total) by mouth every 6 (six) hours as needed for nausea. 08/19/21   Jovita Kussmaul, MD   prochlorperazine (COMPAZINE) 10 MG tablet Take 1 tablet (10 mg total) by mouth every 6 (six) hours as needed (Nausea or vomiting). 08/27/21   Nicholas Lose, MD     Family History  Problem Relation Age of Onset   Uterine cancer Mother 56   Diabetes Father    Hyperlipidemia Father    Hypertension Father    Melanoma Sister 9       T1a; on back   ADD / ADHD Sister    Diabetes Sister    Hypertension Sister    Heart disease Maternal Grandmother    Bipolar disorder Maternal Grandfather    Heart disease Paternal Grandmother    Prostate cancer Paternal Grandfather        dx after 54   Diabetes Paternal Grandfather    Breast cancer Other 63       MGM's mother    Social History   Socioeconomic History   Marital status: Divorced    Spouse name: Not on file   Number of children: 2   Years of education: Not on file   Highest education level: Not on file  Occupational History   Occupation: RN  Tobacco Use   Smoking status: Former    Types: Cigarettes    Quit date: 2018    Years since quitting: 4.8   Smokeless tobacco: Never  Vaping Use   Vaping Use: Former  Substance and Sexual Activity   Alcohol use: Yes    Comment: occasionally   Drug use: No   Sexual activity: Yes    Birth control/protection: Surgical  Other Topics Concern   Not on file  Social History Narrative   2 adopted children: 68 yo son, 31 yo daughter,    Social Determinants of Radio broadcast assistant Strain: Not on file  Food Insecurity: Not on file  Transportation Needs: Not on file  Physical Activity: Not on file  Stress: Not on file  Social Connections: Not on file      Review of Systems: A 12 point ROS discussed and pertinent positives are indicated in the HPI above.  All other systems are negative.  Review of Systems  Constitutional:  Negative for appetite change, chills and fever.  HENT:  Negative for nosebleeds.   Eyes:  Negative for visual disturbance.  Respiratory:  Negative for cough  and shortness of breath.   Cardiovascular:  Negative for chest pain and leg swelling.  Gastrointestinal:  Negative for abdominal pain, nausea and vomiting.  Genitourinary:  Negative for hematuria.  Neurological:  Negative for dizziness, light-headedness and headaches.   Vital Signs: There were no vitals taken for this visit.  Physical Exam Constitutional:      Appearance: Normal appearance. She is not ill-appearing.  HENT:     Head: Normocephalic and atraumatic.     Mouth/Throat:     Mouth: Mucous membranes are moist.     Pharynx: Oropharynx is clear.  Eyes:  Pupils: Pupils are equal, round, and reactive to light.  Cardiovascular:     Rate and Rhythm: Normal rate and regular rhythm.     Pulses: Normal pulses.     Heart sounds: Normal heart sounds. No murmur heard.   No friction rub. No gallop.  Pulmonary:     Effort: Pulmonary effort is normal. No respiratory distress.     Breath sounds: Normal breath sounds. No stridor. No wheezing, rhonchi or rales.  Abdominal:     General: Bowel sounds are normal.     Tenderness: There is no abdominal tenderness. There is no guarding.  Musculoskeletal:     Right lower leg: No edema.     Left lower leg: No edema.  Skin:    General: Skin is warm and dry.     Comments: Bilateral mastectomy scars healing with no drainage, bleeding, erythema or s/sx of infection. Pt reports surgery was 3 weeks ago.   Neurological:     Mental Status: She is alert and oriented to person, place, and time.  Psychiatric:        Mood and Affect: Mood normal.        Behavior: Behavior normal.        Thought Content: Thought content normal.        Judgment: Judgment normal.    Imaging: NM Sentinel Node Inj-No Rpt (Breast)  Result Date: 08/18/2021 Sulfur Colloid was injected by the Nuclear Medicine Technologist for sentinel lymph node localization.    Labs:  CBC: Recent Labs    07/16/21 1219 08/14/21 1100  WBC 7.1 6.8  HGB 13.9 14.1  HCT 39.1 41.8   PLT 258 253    COAGS: No results for input(s): INR, APTT in the last 8760 hours.  BMP: Recent Labs    01/07/21 1617 07/16/21 1219  NA 138 139  K 3.5 3.7  CL 103 102  CO2 27 27  GLUCOSE 92 104*  BUN 8 9  CALCIUM 9.4 9.6  CREATININE 0.70 0.78  GFRNONAA  --  >60    LIVER FUNCTION TESTS: Recent Labs    01/07/21 1617 07/16/21 1219  BILITOT 0.3 0.5  AST 12 14*  ALT 10 14  ALKPHOS 56 55  PROT 7.2 7.8  ALBUMIN 4.3 4.4    TUMOR MARKERS: No results for input(s): AFPTM, CEA, CA199, CHROMGRNA in the last 8760 hours.  Assessment and Plan: History of abnormal uterine bleeding, ADHD, anxiety, malignant neoplasm of right breast, endometriosis, HLD, migraine and PCOS.  Patient had mammogram 07/08/2021 that was showed highly suspicious right breast mass.  Biopsy resulted in invasive ductal carcinoma of the right breast. Patient had right breast mastectomy on 08/18/2021.  Patient was referred by Dr. Lindi Adie to IR for Port-A-Cath placement to start chemotherapy.  Pt resting quietly on stretcher. She is is A&O, calm and pleasant. She is in no distress.  Pt states she is NPO per order.  No labs needed for today. VSS.   Risks and benefits of image guided port-a-catheter placement was discussed with the patient including, but not limited to bleeding, infection, pneumothorax, or fibrin sheath development and need for additional procedures.  All of the patient's questions were answered, patient is agreeable to proceed. Consent signed and in chart.   Thank you for this interesting consult.  I greatly enjoyed meeting Katelyn Hobbs and look forward to participating in their care.  A copy of this report was sent to the requesting provider on this date.  Electronically Signed: Alysia Penna  Dorothye Berni, NP 09/09/2021, 12:05 PM   I spent a total of 30 minutes in face to face in clinical consultation, greater than 50% of which was counseling/coordinating care for image guided Port-A-Cath  placement.

## 2021-09-07 ENCOUNTER — Other Ambulatory Visit: Payer: Self-pay | Admitting: Radiology

## 2021-09-08 ENCOUNTER — Other Ambulatory Visit: Payer: Self-pay | Admitting: Radiology

## 2021-09-09 ENCOUNTER — Other Ambulatory Visit: Payer: Self-pay | Admitting: Hematology and Oncology

## 2021-09-09 ENCOUNTER — Encounter (HOSPITAL_COMMUNITY): Payer: Self-pay

## 2021-09-09 ENCOUNTER — Ambulatory Visit (HOSPITAL_COMMUNITY)
Admission: RE | Admit: 2021-09-09 | Discharge: 2021-09-09 | Disposition: A | Discharge status: Home / Self Care | Payer: BC Managed Care – PPO | Source: Ambulatory Visit | Attending: Hematology and Oncology | Admitting: Hematology and Oncology

## 2021-09-09 ENCOUNTER — Other Ambulatory Visit: Payer: Self-pay

## 2021-09-09 DIAGNOSIS — N809 Endometriosis, unspecified: Secondary | ICD-10-CM | POA: Diagnosis not present

## 2021-09-09 DIAGNOSIS — C50411 Malignant neoplasm of upper-outer quadrant of right female breast: Secondary | ICD-10-CM | POA: Insufficient documentation

## 2021-09-09 DIAGNOSIS — G43909 Migraine, unspecified, not intractable, without status migrainosus: Secondary | ICD-10-CM | POA: Insufficient documentation

## 2021-09-09 DIAGNOSIS — E282 Polycystic ovarian syndrome: Secondary | ICD-10-CM | POA: Insufficient documentation

## 2021-09-09 DIAGNOSIS — F909 Attention-deficit hyperactivity disorder, unspecified type: Secondary | ICD-10-CM | POA: Insufficient documentation

## 2021-09-09 DIAGNOSIS — Z170 Estrogen receptor positive status [ER+]: Secondary | ICD-10-CM | POA: Diagnosis not present

## 2021-09-09 DIAGNOSIS — Z452 Encounter for adjustment and management of vascular access device: Secondary | ICD-10-CM | POA: Diagnosis not present

## 2021-09-09 DIAGNOSIS — E785 Hyperlipidemia, unspecified: Secondary | ICD-10-CM | POA: Insufficient documentation

## 2021-09-09 DIAGNOSIS — C50919 Malignant neoplasm of unspecified site of unspecified female breast: Secondary | ICD-10-CM | POA: Diagnosis not present

## 2021-09-09 DIAGNOSIS — F419 Anxiety disorder, unspecified: Secondary | ICD-10-CM | POA: Diagnosis not present

## 2021-09-09 HISTORY — DX: Nausea with vomiting, unspecified: R11.2

## 2021-09-09 HISTORY — DX: Other complications of anesthesia, initial encounter: T88.59XA

## 2021-09-09 HISTORY — PX: IR IMAGING GUIDED PORT INSERTION: IMG5740

## 2021-09-09 HISTORY — DX: Other specified postprocedural states: Z98.890

## 2021-09-09 IMAGING — US IR IMAGING GUIDED PORT INSERTION
1 series · 1 of 1 positions shown · non-contrast
Comparison: none

Addendum:
INDICATION: Breast cancer.

[Series 1: ir fluoro/shunt/fist · 1 of 1 slices shown]
[im 1/1]
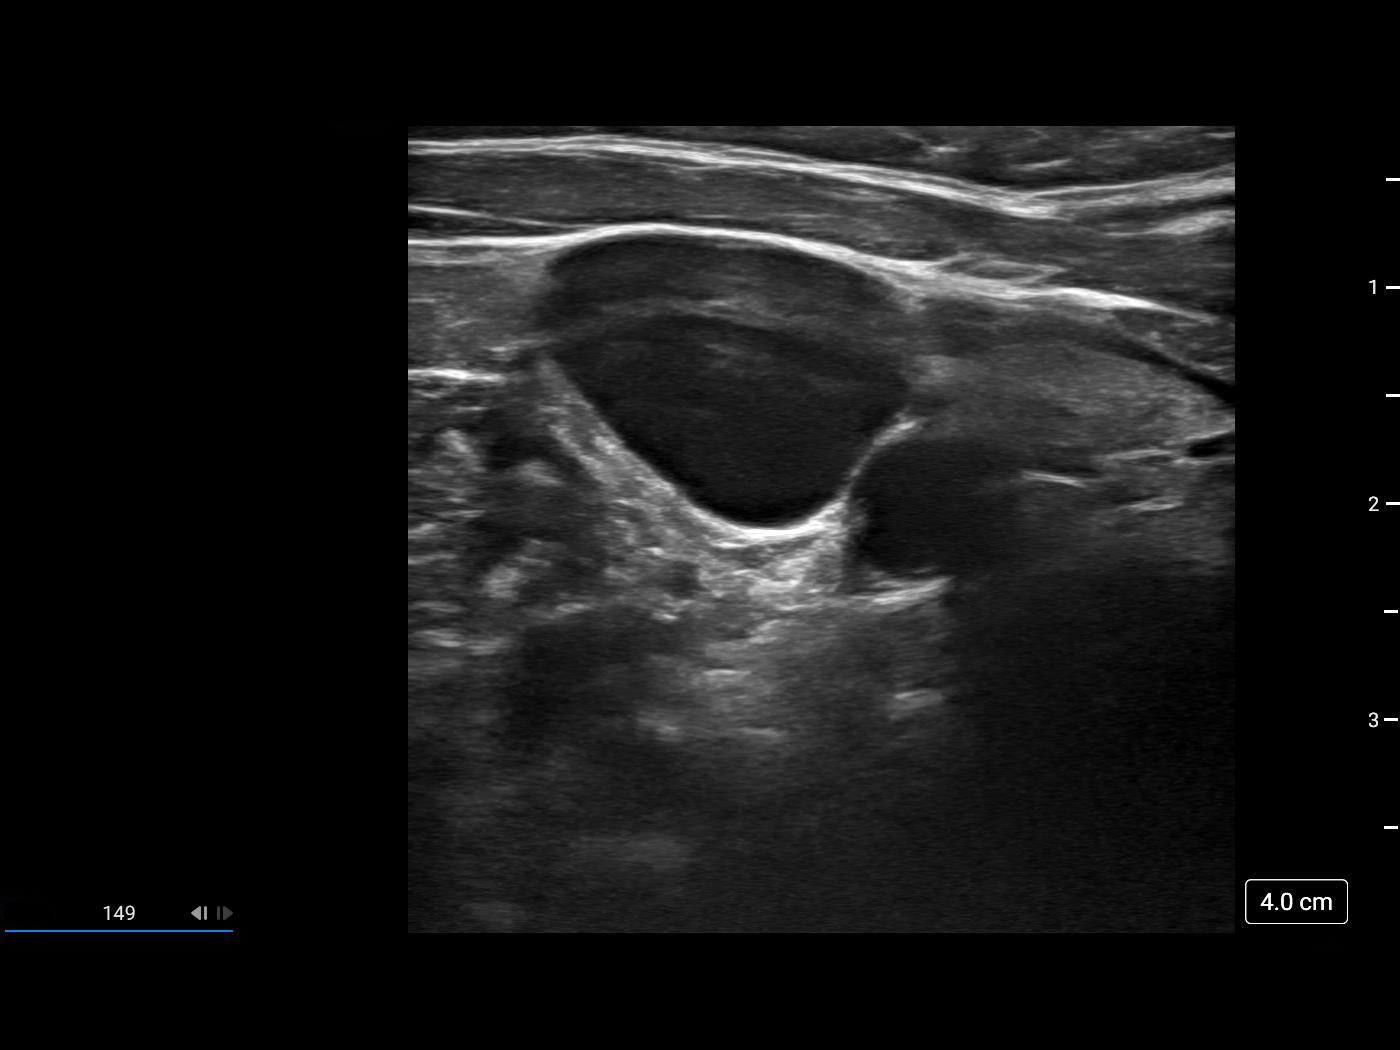

[1 of 1 positions shown; findings below may reference images not displayed]

EXAM:
IMPLANTED PORT A CATH PLACEMENT WITH ULTRASOUND AND FLUOROSCOPIC
GUIDANCE

MEDICATIONS:
None; The antibiotic was administered within an appropriate time
interval prior to skin puncture.

ANESTHESIA/SEDATION:
Moderate (conscious) sedation was employed during this procedure. A
total of Versed 4 mg and Fentanyl 100 mcg was administered
intravenously.

Moderate Sedation Time: 25 minutes. The patient's level of
consciousness and vital signs were monitored continuously by
radiology nursing throughout the procedure under my direct
supervision.

FLUOROSCOPY TIME:  0 minutes, 6 seconds (0 mGy)

COMPLICATIONS:
None immediate.

PROCEDURE:
The procedure, risks, benefits, and alternatives were explained to
the patient. Questions regarding the procedure were encouraged and
answered. The patient understands and consents to the procedure.

The neck and chest were prepped with chlorhexidine in a sterile
fashion, and a sterile drape was applied covering the operative
field. Maximum barrier sterile technique with sterile gowns and
gloves were used for the procedure. A timeout was performed prior to
the initiation of the procedure. Local anesthesia was provided with
1% lidocaine with epinephrine.

After creating a small venotomy incision, a micropuncture kit was
utilized to access the internal jugular vein under direct, real-time
ultrasound guidance. Ultrasound image documentation was performed.
The microwire was kinked to measure appropriate catheter length.

A subcutaneous port pocket was then created along the upper chest
wall utilizing a combination of sharp and blunt dissection. The
pocket was irrigated with sterile saline. A single lumen ISP power
injectable port was chosen for placement. The 8 Fr catheter was
tunneled from the port pocket site to the venotomy incision. The
port was placed in the pocket. The external catheter was trimmed to
appropriate length. At the venotomy, an 8 Fr peel-away sheath was
placed over a guidewire under fluoroscopic guidance. The catheter
was then placed through the sheath and the sheath was removed. Final
catheter positioning was confirmed and documented with a
fluoroscopic spot radiograph. The port was accessed with DEVOTE
needle, aspirated and flushed with heparinized saline.

The port pocket incision was closed with interrupted 3-0 Vicryl
suture then Dermabond was applied, including at the venotomy
incision. Dressings were placed. The patient tolerated the procedure
well without immediate post procedural complication.
IMPRESSION: Successful placement of a right internal jugular approach power
injectable Port-A-Cath.

The catheter is ready for immediate use.

ADDENDUM:
Successful RIGHT-sided SL port placement, with the tip of the
catheter in the proximal right atrium.

*** End of Addendum ***
EXAM:
IMPLANTED PORT A CATH PLACEMENT WITH ULTRASOUND AND FLUOROSCOPIC
GUIDANCE

MEDICATIONS:
None; The antibiotic was administered within an appropriate time
interval prior to skin puncture.

ANESTHESIA/SEDATION:
Moderate (conscious) sedation was employed during this procedure. A
total of Versed 4 mg and Fentanyl 100 mcg was administered
intravenously.

Moderate Sedation Time: 25 minutes. The patient's level of
consciousness and vital signs were monitored continuously by
radiology nursing throughout the procedure under my direct
supervision.

FLUOROSCOPY TIME:  0 minutes, 6 seconds (0 mGy)

COMPLICATIONS:
None immediate.

PROCEDURE:
The procedure, risks, benefits, and alternatives were explained to
the patient. Questions regarding the procedure were encouraged and
answered. The patient understands and consents to the procedure.

The neck and chest were prepped with chlorhexidine in a sterile
fashion, and a sterile drape was applied covering the operative
field. Maximum barrier sterile technique with sterile gowns and
gloves were used for the procedure. A timeout was performed prior to
the initiation of the procedure. Local anesthesia was provided with
1% lidocaine with epinephrine.

After creating a small venotomy incision, a micropuncture kit was
utilized to access the internal jugular vein under direct, real-time
ultrasound guidance. Ultrasound image documentation was performed.
The microwire was kinked to measure appropriate catheter length.

A subcutaneous port pocket was then created along the upper chest
wall utilizing a combination of sharp and blunt dissection. The
pocket was irrigated with sterile saline. A single lumen ISP power
injectable port was chosen for placement. The 8 Fr catheter was
tunneled from the port pocket site to the venotomy incision. The
port was placed in the pocket. The external catheter was trimmed to
appropriate length. At the venotomy, an 8 Fr peel-away sheath was
placed over a guidewire under fluoroscopic guidance. The catheter
was then placed through the sheath and the sheath was removed. Final
catheter positioning was confirmed and documented with a
fluoroscopic spot radiograph. The port was accessed with DEVOTE
needle, aspirated and flushed with heparinized saline.

The port pocket incision was closed with interrupted 3-0 Vicryl
suture then Dermabond was applied, including at the venotomy
incision. Dressings were placed. The patient tolerated the procedure
well without immediate post procedural complication.
IMPRESSION: Successful placement of a right internal jugular approach power
injectable Port-A-Cath.

The catheter is ready for immediate use.

## 2021-09-09 MED ORDER — SODIUM CHLORIDE 0.9 % IV SOLN: INTRAVENOUS | Status: DC

## 2021-09-09 MED ORDER — MIDAZOLAM HCL 2 MG/2ML IJ SOLN
INTRAMUSCULAR | Status: AC | PRN
  Administered 2021-09-09: 1 mg via INTRAVENOUS
  Administered 2021-09-09: 2 mg via INTRAVENOUS
  Administered 2021-09-09: 1 mg via INTRAVENOUS

## 2021-09-09 MED ORDER — HEPARIN SOD (PORK) LOCK FLUSH 100 UNIT/ML IV SOLN
INTRAVENOUS | Status: AC | PRN
  Administered 2021-09-09: 500 [IU] via INTRAVENOUS

## 2021-09-09 MED ORDER — HEPARIN SOD (PORK) LOCK FLUSH 100 UNIT/ML IV SOLN
INTRAVENOUS | Status: AC
  Filled 2021-09-09: qty 5

## 2021-09-09 MED ORDER — FENTANYL CITRATE (PF) 100 MCG/2ML IJ SOLN
INTRAMUSCULAR | Status: AC
  Filled 2021-09-09: qty 2

## 2021-09-09 MED ORDER — FENTANYL CITRATE (PF) 100 MCG/2ML IJ SOLN
INTRAMUSCULAR | Status: AC | PRN
  Administered 2021-09-09 (×2): 50 ug via INTRAVENOUS

## 2021-09-09 MED ORDER — LIDOCAINE HCL 1 % IJ SOLN
INTRAMUSCULAR | Status: AC
  Filled 2021-09-09: qty 20

## 2021-09-09 MED ORDER — MIDAZOLAM HCL 2 MG/2ML IJ SOLN
INTRAMUSCULAR | Status: AC
  Filled 2021-09-09: qty 4

## 2021-09-09 MED ORDER — LIDOCAINE-EPINEPHRINE (PF) 1 %-1:200000 IJ SOLN
INTRAMUSCULAR | Status: AC | PRN
  Administered 2021-09-09: 10 mL

## 2021-09-09 NOTE — Discharge Instructions (Signed)
Interventional radiology phone numbers °[phone] °After hours [phone] ° ° ° °You have skin glue (dermabond) over your new port. Do not use the lidocaine cream (EMLA cream) over the skin glue until it has healed. The petroleum in the lidocaine cream will dissolve the skin glue resulting in an infection of your new port. Use ice in a zip lock bag for 1-2 minutes over your new port before the cancer center nurses access your port. ° ° °Implanted Port Insertion, Care After °This sheet gives you information about how to care for yourself after your procedure. Your health care provider may also give you more specific instructions. If you have problems or questions, contact your health care provider. °What can I expect after the procedure? °After the procedure, it is common to have: °Discomfort at the port insertion site. °Bruising on the skin over the port. This should improve over 3-4 days. °Follow these instructions at home: °Port care °After your port is placed, you will get a manufacturer's information card. The card has information about your port. Keep this card with you at all times. °Take care of the port as told by your health care provider. Ask your health care provider if you or a family member can get training for taking care of the port at home. A home health care nurse may also take care of the port. °Make sure to remember what type of port you have. °Incision care °Follow instructions from your health care provider about how to take care of your port insertion site. Make sure you: °Wash your hands with soap and water before and after you change your bandage (dressing). If soap and water are not available, use hand sanitizer. °Change your dressing as told by your health care provider. °Leave skin glue in place. These skin closures may need to stay in place for 2 weeks or longer.  °Check your port insertion site every day for signs of infection. Check for: °Redness, swelling, or pain. °Fluid or  blood. °Warmth. °Pus or a bad smell.  °  °  °Activity °Return to your normal activities as told by your health care provider. Ask your health care provider what activities are safe for you. °Do not lift anything that is heavier than 10 lb (4.5 kg), or the limit that you are told, until your health care provider says that it is safe. °General instructions °Take over-the-counter and prescription medicines only as told by your health care provider. °Do not take baths, swim, or use a hot tub until your health care provider approves.You may remove your dressing tomorrow and shower 24 hours after your procedure. °Do not drive for 24 hours if you were given a sedative during your procedure. °Wear a medical alert bracelet in case of an emergency. This will tell any health care providers that you have a port. °Keep all follow-up visits as told by your health care provider. This is important. °Contact a health care provider if: °You cannot flush your port with saline as directed, or you cannot draw blood from the port. °You have a fever or chills. °You have redness, swelling, or pain around your port insertion site. °You have fluid or blood coming from your port insertion site. °Your port insertion site feels warm to the touch. °You have pus or a bad smell coming from the port insertion site. °Get help right away if: °You have chest pain or shortness of breath. °You have bleeding from your port that you cannot control. °Summary °Take care of   the port as told by your health care provider. Keep the manufacturer's information card with you at all times. °Change your dressing as told by your health care provider. °Contact a health care provider if you have a fever or chills or if you have redness, swelling, or pain around your port insertion site. °Keep all follow-up visits as told by your health care provider. °This information is not intended to replace advice given to you by your health care provider. Make sure you discuss any  questions you have with your health care provider. °Document Revised: 05/10/2018 Document Reviewed: 05/10/2018 °Elsevier Patient Education © 2021 [employer] ° ° ° °Moderate Conscious Sedation, Adult, Care After °This sheet gives you information about how to care for yourself after your procedure. Your health care provider may also give you more specific instructions. If you have problems or questions, contact your health care provider. °What can I expect after the procedure? °After the procedure, it is common to have: °Sleepiness for several hours. °Impaired judgment for several hours. °Difficulty with balance. °Vomiting if you eat too soon. °Follow these instructions at home: °For the time period you were told by your health care provider: °Rest. °Do not participate in activities where you could fall or become injured. °Do not drive or use machinery. °Do not drink alcohol. °Do not take sleeping pills or medicines that cause drowsiness. °Do not make important decisions or sign legal documents. °Do not take care of children on your own.  °  °  °Eating and drinking °Follow the diet recommended by your health care provider. °Drink enough fluid to keep your urine pale yellow. °If you vomit: °Drink water, juice, or soup when you can drink without vomiting. °Make sure you have little or no nausea before eating solid foods.   °General instructions °Take over-the-counter and prescription medicines only as told by your health care provider. °Have a responsible adult stay with you for the time you are told. It is important to have someone help care for you until you are awake and alert. °Do not smoke. °Keep all follow-up visits as told by your health care provider. This is important. °Contact a health care provider if: °You are still sleepy or having trouble with balance after 24 hours. °You feel light-headed. °You keep feeling nauseous or you keep vomiting. °You develop a rash. °You have a fever. °You have redness or  swelling around the IV site. °Get help right away if: °You have trouble breathing. °You have new-onset confusion at home. °Summary °After the procedure, it is common to feel sleepy, have impaired judgment, or feel nauseous if you eat too soon. °Rest after you get home. Know the things you should not do after the procedure. °Follow the diet recommended by your health care provider and drink enough fluid to keep your urine pale yellow. °Get help right away if you have trouble breathing or new-onset confusion at home. °This information is not intended to replace advice given to you by your health care provider. Make sure you discuss any questions you have with your health care provider. °Document Revised: 02/09/2020 Document Reviewed: 09/07/2019 °Elsevier Patient Education © 2021 [employer] Inc[employer]  °

## 2021-09-09 NOTE — Procedures (Signed)
Vascular and Interventional Radiology Procedure Note  Patient: Katelyn Hobbs DOB: 10-Nov-1975 Medical Record Number: [id number] Note Date/Time: 09/09/21 3:34 PM   Performing Physician: Michaelle Birks, MD Assistant(s): None  Diagnosis: Breast cancer  Procedure: PORT PLACEMENT  Anesthesia: Conscious Sedation Complications: None Estimated Blood Loss: Minimal  Findings:  Successful right-sided SL port placement, with the tip of the catheter in the proximal right atrium.  Plan: Catheter ready for use.  See detailed procedure note with images in PACS. The patient tolerated the procedure well without incident or complication and was returned to [facility] in stable condition.    Michaelle Birks, MD [facility] Rad[facility]   Pager. [place]

## 2021-09-10 ENCOUNTER — Telehealth: Payer: Self-pay

## 2021-09-10 DIAGNOSIS — F419 Anxiety disorder, unspecified: Secondary | ICD-10-CM | POA: Diagnosis not present

## 2021-09-10 DIAGNOSIS — C44501 Unspecified malignant neoplasm of skin of breast: Secondary | ICD-10-CM | POA: Diagnosis not present

## 2021-09-10 DIAGNOSIS — G479 Sleep disorder, unspecified: Secondary | ICD-10-CM | POA: Diagnosis not present

## 2021-09-10 NOTE — Telephone Encounter (Signed)
Patient notified of completion of Disability forms. Fax transmission confirmation received and copy placed at Registration Desk for pick-up as requested.

## 2021-09-11 ENCOUNTER — Ambulatory Visit: Payer: BC Managed Care – PPO | Attending: General Surgery | Admitting: Physical Therapy

## 2021-09-11 ENCOUNTER — Other Ambulatory Visit: Payer: Self-pay | Admitting: *Deleted

## 2021-09-11 ENCOUNTER — Other Ambulatory Visit: Payer: Self-pay

## 2021-09-11 ENCOUNTER — Encounter: Payer: Self-pay | Admitting: *Deleted

## 2021-09-11 ENCOUNTER — Telehealth: Payer: Self-pay | Admitting: *Deleted

## 2021-09-11 ENCOUNTER — Encounter: Payer: Self-pay | Admitting: Physical Therapy

## 2021-09-11 DIAGNOSIS — M25611 Stiffness of right shoulder, not elsewhere classified: Secondary | ICD-10-CM | POA: Diagnosis not present

## 2021-09-11 DIAGNOSIS — R293 Abnormal posture: Secondary | ICD-10-CM | POA: Diagnosis not present

## 2021-09-11 DIAGNOSIS — Z170 Estrogen receptor positive status [ER+]: Secondary | ICD-10-CM | POA: Diagnosis not present

## 2021-09-11 DIAGNOSIS — Z483 Aftercare following surgery for neoplasm: Secondary | ICD-10-CM | POA: Diagnosis not present

## 2021-09-11 DIAGNOSIS — R600 Localized edema: Secondary | ICD-10-CM | POA: Diagnosis not present

## 2021-09-11 DIAGNOSIS — M25612 Stiffness of left shoulder, not elsewhere classified: Secondary | ICD-10-CM | POA: Diagnosis not present

## 2021-09-11 DIAGNOSIS — C50411 Malignant neoplasm of upper-outer quadrant of right female breast: Secondary | ICD-10-CM | POA: Diagnosis not present

## 2021-09-11 NOTE — Patient Instructions (Signed)
     [facility]  [address]  [place] [address]  [phone]  After Breast Cancer Class It is recommended you attend the ABC class to be educated on lymphedema risk reduction. This class is free of charge and lasts for 1 hour. It is a 1-time class. You are scheduled for December 19th at 11:00. You need to download the Webex app and we will send you a link to you email the day before or the morning of.  Scar massage You can begin gentle scar massage once your glue is completely off and you can see that your incisions are closed, begin gently moving the skin at your incision sites in all different directions. Don't slide on your skin. Then gently massage coconut oil into the incision sites.  Compression garment Continue wearing you compression bra until your swelling is gone.  Home exercise Program Continue doing the exercises given. Do exercise #1 and #4 on the sheet laying on your back.  Also add on the 2 exercises below.  Follow up PT: It is recommended you return every 3 months for the first 3 years following surgery to be assessed on the SOZO machine for an L-Dex score. This helps prevent clinically significant lymphedema in 95% of patients. These follow up screens are 10 minute appointments that you are not billed for. You are scheduled for Jan. 26th 2023 at 11:10.  Closed Chain: Shoulder Abduction / Adduction - on Wall    One hand on wall, step to side and return. Stepping causes shoulder to abduct and adduct. Step _5__ times, holding 5 seconds, _2__ times per day.  http://ss.exer.us/267   Copyright  VHI. All rights reserved.  Closed Chain: Shoulder Flexion / Extension - on Wall    Hands on wall, step backward. Return. Stepping causes shoulder flexion and extension Step _5__ times, holding 5 seconds, _2__ times per day. http://ss.exer.us/265   Copyright  VHI. All rights reserved.

## 2021-09-11 NOTE — Telephone Encounter (Signed)
Received call from patient with a request for a letter with breast cancer information for Katelyn Hobbs B. [place].  I will provide letter for patient and leave at front desk for her to pick up.

## 2021-09-11 NOTE — Therapy (Signed)
[facility] @ [address] [place], [place], [address] Phone: [phone]   Fax:  [phone]  Physical Therapy Treatment  Patient Details  Name: Katelyn Hobbs MRN: [id number] Date of Birth: [birth year] Referring Provider (PT): Dr. Autumn Messing   Encounter Date: 09/11/2021   PT End of Session - 09/11/21 1330     Visit Number 2    Number of Visits 2    PT Start Time 1100    PT Stop Time 1150    PT Time Calculation (min) 50 min    Activity Tolerance Patient tolerated treatment well    Behavior During Therapy [facility] for tasks assessed/performed             Past Medical History:  Diagnosis Date   Abnormal uterine bleeding    ADHD (attention deficit hyperactivity disorder)    Anemia    Anxiety    Bacterial vaginosis    Cancer ([facility]) 06/2021   breast cancer   Complication of anesthesia    Depression    Endometriosis    Family history of breast cancer 07/17/2021   Family history of melanoma 07/17/2021   Family history of prostate cancer 07/17/2021   Family history of uterine cancer 07/17/2021   Hyperlipidemia    Migraine    PCOS (polycystic ovarian syndrome)    PONV (postoperative nausea and vomiting)     Past Surgical History:  Procedure Laterality Date   IR IMAGING GUIDED PORT INSERTION  09/09/2021   LAPAROSCOPY  1993   MASTECTOMY W/ SENTINEL NODE BIOPSY Right 08/18/2021   Procedure: RIGHT MASTECTOMY WITH RIGHT SENTINEL LYMPH NODE BIOPSY;  Surgeon: Jovita Kussmaul, MD;  Location: [facility];  Service: General;  Laterality: Right;   SALPINGECTOMY Bilateral 2015   TOTAL MASTECTOMY Left 08/18/2021   Procedure: LEFT TOTAL MASTECTOMY;  Surgeon: Jovita Kussmaul, MD;  Location: [facility];  Service: General;  Laterality: Left;   VAGINAL HYSTERECTOMY  2017    There were no vitals filed for this visit.   Subjective Assessment - 09/11/21 1107     Subjective Patient reports she underwent a bilateral mastectomy with right  sentinel node biopsy (3 negative nodes) on 08/18/2021. Drains were removed on 08/29/2021 and port was placed 09/09/2021. She will undergo chemotherapy.    Pertinent History Patient was diagnosed on 07/01/2021 with right grade III invasive ductal carcinoma breast cancer.She underwent a bilateral mastectomy with right sentinel node biopsy (3 negative nodes) on 08/18/2021. It was ER positive on initial biopsy but negative on final pathology, PR negative, and HER2 negative with a Ki67 of 40%.    Patient Stated Goals Get arms moving better    Currently in Pain? No/denies   Only pain is related to port being placed recently               [facility] PT Assessment - 09/11/21 0001       Assessment   Medical Diagnosis s/p bil mastectomy and right SLNB    Referring Provider (PT) Dr. Autumn Messing    Onset Date/Surgical Date 08/18/21    Hand Dominance Right    Prior Therapy Baselines      Precautions   Precautions Other (comment)    Precaution Comments right arm lymphedema risk      Restrictions   Weight Bearing Restrictions No      Balance Screen   Has the patient fallen in the past 6 months No    Has the patient had a  decrease in activity level because of a fear of falling?  No    Is the patient reluctant to leave their home because of a fear of falling?  No      Home Environment   Living Environment Private residence    Living Arrangements Spouse/significant other;Children   Boyfriend and 33 y.o. daughter   Available Help at Discharge Family      Prior Function   Level of Independence Independent    Vocation Full time employment    Engineer, mining at [employer] She is walking 15 min each day and sometimes at a park 2x/week      Cognition   Overall Cognitive Status Within Functional Limits for tasks assessed      Posture/Postural Control   Posture/Postural Control Postural limitations    Postural Limitations Rounded Shoulders;Forward head      ROM /  Strength   AROM / PROM / Strength AROM      AROM   AROM Assessment Site Shoulder    Right/Left Shoulder Right    Right Shoulder Extension 58 Degrees    Right Shoulder Flexion 139 Degrees    Right Shoulder ABduction 156 Degrees    Right Shoulder Internal Rotation 71 Degrees    Right Shoulder External Rotation 81 Degrees    Left Shoulder Extension 46 Degrees    Left Shoulder Flexion 145 Degrees    Left Shoulder ABduction 158 Degrees    Left Shoulder Internal Rotation 68 Degrees    Left Shoulder External Rotation 88 Degrees      Strength   Overall Strength Unable to assess;Due to precautions               LYMPHEDEMA/ONCOLOGY QUESTIONNAIRE - 09/11/21 0001       Type   Cancer Type Right breast cancer      Surgeries   Mastectomy Date 08/18/21    Sentinel Lymph Node Biopsy Date 08/18/21    Number Lymph Nodes Removed 3      Treatment   Active Chemotherapy Treatment No    Active Radiation Treatment No    Current Hormone Treatment No    Past Hormone Therapy No      What other symptoms do you have   Are you Having Heaviness or Tightness No    Are you having Pain No    Are you having pitting edema No    Is it Hard or Difficult finding clothes that fit No    Do you have infections No    Is there Decreased scar mobility Yes    Stemmer Sign No      Lymphedema Assessments   Lymphedema Assessments Upper extremities      Right Upper Extremity Lymphedema   10 cm Proximal to Olecranon Process 29.4 cm    Olecranon Process 24.9 cm    10 cm Proximal to Ulnar Styloid Process 23.2 cm    Just Proximal to Ulnar Styloid Process 15.7 cm    Across Hand at [employer] 18 cm    At [place] of 2nd Digit 5.8 cm      Left Upper Extremity Lymphedema   10 cm Proximal to Olecranon Process 27.8 cm    Olecranon Process 24.3 cm    10 cm Proximal to Ulnar Styloid Process 22.6 cm    Just Proximal to Ulnar Styloid Process 14.8 cm    Across Hand at [employer] 17.6 cm    At [place] of 2nd  Digit 5.8 cm                [name] - 09/11/21 0001     Open a tight or new jar No difficulty    Do heavy household chores (wash walls, wash floors) No difficulty    Carry a shopping bag or briefcase No difficulty    Wash your back Mild difficulty    Use a knife to cut food No difficulty    Recreational activities in which you take some force or impact through your arm, shoulder, or hand (golf, hammering, tennis) Mild difficulty    During the past week, to what extent has your arm, shoulder or hand problem interfered with your normal social activities with family, friends, neighbors, or groups? Not at all    During the past week, to what extent has your arm, shoulder or hand problem limited your work or other regular daily activities Not at all    Arm, shoulder, or hand pain. None    Tingling (pins and needles) in your arm, shoulder, or hand None    Difficulty Sleeping No difficulty    DASH Score 4.55 %                             PT Education - 09/11/21 1322     Education Details Aftercare; scar massage; lymphedema education    Person(s) Educated Patient    Methods Explanation;Demonstration;Handout    Comprehension Returned demonstration;Verbalized understanding                 PT Long Term Goals - 09/11/21 1337       PT LONG TERM GOAL #1   Title Patient will demonstrate she has regained full shoulder ROM and function post operatively compared to baselines.    Time 8    Period Weeks    Status Partially Met                   Plan - 09/11/21 1330     Clinical Impression Statement Patient is doing well s/p bilateral mastectomy and right sentinel node biopsy on 08/18/2021. She has nearly regained full shoulder ROM, shows no signs of arm lymphedema, and her incisions are healing well with glue still present. She has a moderate amount of chest edema which will benefit from continued use of her compression bra. She was given additional  exercises today to regain the last 10 degrees of shoulder flexion and abduction, but otherwise has no further PT nedes at this time. She will begin chemotherapy in about 2 weeks and knows to contact us if she develops issues from that. She also plans to attend the After Breast Cancer class on 10/13/2021.    PT Treatment/Interventions ADLs/Self Care Home Management;Therapeutic exercise;Patient/family education    PT Next Visit Plan D/C - pt did not want to f/u again in a month but will call if she has problems.    PT Home Exercise Plan Post op ROM HEP and closed chain flexion and abduction    Consulted and Agree with Plan of Care Patient             Patient will benefit from skilled therapeutic intervention in order to improve the following deficits and impairments:  Postural dysfunction, Decreased range of motion, Decreased knowledge of precautions, Impaired UE functional use, Pain, Increased edema  Visit Diagnosis: Malignant neoplasm of upper-outer quadrant of right breast in female, estrogen receptor positive (  [place])  Abnormal posture  Aftercare following surgery for neoplasm  Stiffness of left shoulder, not elsewhere classified  Stiffness of right shoulder, not elsewhere classified  Localized edema     Problem List Patient Active Problem List   Diagnosis Date Noted   Cancer of right female breast ([place]) 08/18/2021   Family history of uterine cancer 07/17/2021   Family history of breast cancer 07/17/2021   Family history of prostate cancer 07/17/2021   Family history of melanoma 07/17/2021   Genetic testing 07/17/2021   Malignant neoplasm of upper-outer quadrant of right breast in female, estrogen receptor positive ([place]) 07/14/2021   Otitis media 06/11/2021   Mass of breast, right 06/11/2021   Breast calcifications on mammogram 03/16/2021   Encounter for preventive health examination 01/08/2021   Primary insomnia 09/24/2020   PCOS (polycystic ovarian syndrome) 04/16/2020    Paroxysmal SVT (supraventricular tachycardia) ([place]) 04/16/2020   Screen for STD (sexually transmitted disease) 04/16/2020   S/P total hysterectomy 04/16/2020   GAD (generalized anxiety disorder) 11/11/2019   Vitamin D deficiency 09/21/2015   Elevated glucose level 09/21/2015   Obesity (BMI 30.0-34.9) 09/17/2015   PHYSICAL THERAPY DISCHARGE SUMMARY  Visits from Start of Care: 2  Current functional level related to goals / functional outcomes: Slightly limited compared to baselines with flexion and abduction but otherwise goals met.   Remaining deficits: Slight limitation (~10 degrees) with active shoulder flexion and abduction bilaterally.   Education / Equipment: HEP and lymphedema education  Patient agrees to discharge. Patient goals were partially met. Patient is being discharged due to being pleased with the current functional level.  Annia Friendly, [place] 09/11/21 1:42 PM    [facility] @ [address] [place], [place], [address] Phone: [phone]   Fax:  [phone]  Name: Katelyn Hobbs MRN: [id number] Date of Birth: [birth year]

## 2021-09-12 ENCOUNTER — Ambulatory Visit (HOSPITAL_COMMUNITY)
Admission: RE | Admit: 2021-09-12 | Discharge: 2021-09-12 | Disposition: A | Discharge status: Home / Self Care | Payer: BC Managed Care – PPO | Source: Ambulatory Visit | Attending: Hematology and Oncology | Admitting: Hematology and Oncology

## 2021-09-12 ENCOUNTER — Inpatient Hospital Stay: Payer: BC Managed Care – PPO

## 2021-09-12 DIAGNOSIS — C50411 Malignant neoplasm of upper-outer quadrant of right female breast: Secondary | ICD-10-CM

## 2021-09-12 DIAGNOSIS — Z170 Estrogen receptor positive status [ER+]: Secondary | ICD-10-CM

## 2021-09-12 NOTE — Progress Notes (Signed)
Pharmacist Chemotherapy Monitoring - Initial Assessment    Anticipated start date: 09/22/21   The following has been reviewed per standard work regarding the patient's treatment regimen: The patient's diagnosis, treatment plan and drug doses, and organ/hematologic function Lab orders and baseline tests specific to treatment regimen  The treatment plan start date, drug sequencing, and pre-medications Prior authorization status  Patient's documented medication list, including drug-drug interaction screen and prescriptions for anti-emetics and supportive care specific to the treatment regimen The drug concentrations, fluid compatibility, administration routes, and timing of the medications to be used The patient's access for treatment and lifetime cumulative dose history, if applicable  The patient's medication allergies and previous infusion related reactions, if applicable   Changes made to treatment plan:  N/A  Follow up needed:  N/A   Philomena Course, [place], 09/12/2021  2:49 PM

## 2021-09-15 ENCOUNTER — Other Ambulatory Visit: Payer: Self-pay

## 2021-09-15 ENCOUNTER — Ambulatory Visit (HOSPITAL_COMMUNITY)
Admission: RE | Admit: 2021-09-15 | Discharge: 2021-09-15 | Disposition: A | Discharge status: Home / Self Care | Payer: BC Managed Care – PPO | Source: Ambulatory Visit | Attending: Hematology and Oncology | Admitting: Hematology and Oncology

## 2021-09-15 DIAGNOSIS — Z9012 Acquired absence of left breast and nipple: Secondary | ICD-10-CM | POA: Diagnosis not present

## 2021-09-15 DIAGNOSIS — C50411 Malignant neoplasm of upper-outer quadrant of right female breast: Secondary | ICD-10-CM

## 2021-09-15 DIAGNOSIS — Z170 Estrogen receptor positive status [ER+]: Secondary | ICD-10-CM | POA: Insufficient documentation

## 2021-09-15 DIAGNOSIS — C50919 Malignant neoplasm of unspecified site of unspecified female breast: Secondary | ICD-10-CM | POA: Insufficient documentation

## 2021-09-15 DIAGNOSIS — Z0189 Encounter for other specified special examinations: Secondary | ICD-10-CM | POA: Insufficient documentation

## 2021-09-15 DIAGNOSIS — C50911 Malignant neoplasm of unspecified site of right female breast: Secondary | ICD-10-CM | POA: Diagnosis not present

## 2021-09-15 LAB — ECHOCARDIOGRAM COMPLETE
Area-P 1/2: 5.34 cm2
S' Lateral: 2.7 cm

## 2021-09-15 NOTE — Progress Notes (Signed)
  Echocardiogram 2D Echocardiogram has been performed.  Katelyn Hobbs M 09/15/2021, 8:45 AM

## 2021-09-16 ENCOUNTER — Other Ambulatory Visit: Payer: Self-pay | Admitting: Internal Medicine

## 2021-09-17 ENCOUNTER — Telehealth: Payer: Self-pay | Admitting: Genetic Counselor

## 2021-09-17 NOTE — Telephone Encounter (Signed)
Confirmed consent for germline genetic testing for hereditary cancer risks.  Patient said she is now interested due to triple negative status of breast cancer.  Blood draw scheduled for 11/28.  Order will be submitted through [facility] ([facility]  Patient informed that she will be called with results approximately 3 weeks after sample collection.

## 2021-09-17 NOTE — Telephone Encounter (Signed)
Error

## 2021-09-19 MED FILL — Dexamethasone Sodium Phosphate Inj 100 MG/10ML: INTRAMUSCULAR | Qty: 1 | Status: AC

## 2021-09-19 MED FILL — Fosaprepitant Dimeglumine For IV Infusion 150 MG (Base Eq): INTRAVENOUS | Qty: 5 | Status: AC

## 2021-09-21 NOTE — Progress Notes (Signed)
Patient Care Team: Crecencio Mc, MD as PCP - General (Internal Medicine) Mauro Kaufmann, RN as Oncology Nurse Navigator Rockwell Germany, RN as Oncology Nurse Navigator Jovita Kussmaul, MD as Consulting Physician (General Surgery) Nicholas Lose, MD as Consulting Physician (Hematology and Oncology) Eppie Gibson, MD as Attending Physician (Radiation Oncology)  DIAGNOSIS:    ICD-10-CM   1. Malignant neoplasm of upper-outer quadrant of right breast in female, estrogen receptor positive ([place])  C50.411    Z17.0       SUMMARY OF ONCOLOGIC HISTORY: Oncology History  Malignant neoplasm of upper-outer quadrant of right breast in female, estrogen receptor positive ([place])  07/08/2021 Initial Diagnosis   Palpable right breast lump. Diagnostic mammogram: showed highly suspicious right breast mass and an indeterminate single right axillary lymph node with up to 5 mm diffuse cortical thickening. Biopsy: grade 2-3 invasive ductal carcinoma, DCIS, and right axillary lymph node negative for metastatic carcinoma; ER+(80%)/PR-/Her2-.   07/16/2021 Cancer Staging   Staging form: Breast, AJCC 8th Edition - Clinical stage from 07/16/2021: Stage IIB (cT2, cN0, cM0, G3, ER+, PR-, HER2-) - Signed by Nicholas Lose, MD on 07/16/2021 Stage prefix: Initial diagnosis Histologic grading system: 3 grade system    08/18/2021 Surgery   Right mastectomy: Grade 3 IDC 2.8 cm with high-grade DCIS, margins negative, 0/3 lymph nodes negative, ER 0%, PR 0%, HER2 negative, Ki-67 60% (triple negative)   09/22/2021 -  Chemotherapy   Patient is on Treatment Plan : BREAST Dose Dense AC q14d / CARBOplatin D1 + PACLitaxel D1,8,15 q21d       CHIEF COMPLIANT: Cycle 1 dose dense AC  INTERVAL HISTORY: Katelyn Hobbs is a 45 y.o. with above-mentioned history of right breast cancer having undergone bilateral mastectomies, to start chemotherapy with dose dense AC. She presents to the clinic today for treatment.  Her  echocardiogram showed normal ejection fraction.  She had gone through chemo education class.  She is feeling slightly anxious with the start of chemotherapy.  She is extremely worried about the hair fall and plans to shave as early as this week.  ALLERGIES:  is allergic to benadryl [diphenhydramine], latex, and phenergan [promethazine hcl].  MEDICATIONS:  Current Outpatient Medications  Medication Sig Dispense Refill   acetaminophen (TYLENOL) 500 MG tablet Take 1,000 mg by mouth every 8 (eight) hours as needed for moderate pain.     ALPRAZolam (XANAX XR) 2 MG 24 hr tablet TAKE 1 TABLET BY MOUTH EVERYDAY AT BEDTIME 30 tablet 2   cetirizine (ZYRTEC) 10 MG tablet Take 10 mg by mouth at bedtime.     citalopram (CELEXA) 40 MG tablet Take 1 tablet (40 mg total) by mouth daily. 90 tablet 1   dexamethasone (DECADRON) 4 MG tablet Take 1 tablet (4 mg total) by mouth daily. Take 1 tablet day after chemo and 1 tablet 2 days after chemo with food. 12 tablet 0   diltiazem (CARDIZEM) 30 MG tablet TAKE 1 TABLET BY MOUTH AS NEEDED FOR TACHYCARDIA (Patient not taking: No sig reported) 90 tablet 1   ergocalciferol (DRISDOL) 1.25 MG (50000 UT) capsule Take 1 capsule (50,000 Units total) by mouth once a week. 12 capsule 0   HYDROcodone-acetaminophen (NORCO/VICODIN) 5-325 MG tablet Take 1 tablet by mouth every 6 (six) hours as needed for moderate pain. 15 tablet 0   ibuprofen (ADVIL) 200 MG tablet Take 800 mg by mouth every 8 (eight) hours as needed for moderate pain.     lidocaine-prilocaine (EMLA) cream Apply to  affected area once 30 g 3   LORazepam (ATIVAN) 0.5 MG tablet Take 1 tablet (0.5 mg total) by mouth at bedtime as needed for sleep. 30 tablet 0   methocarbamol (ROBAXIN) 500 MG tablet Take 1 tablet (500 mg total) by mouth every 6 (six) hours as needed for muscle spasms. 30 tablet 2   ondansetron (ZOFRAN) 8 MG tablet Take 1 tablet (8 mg total) by mouth 2 (two) times daily as needed. Start on the third day after  chemotherapy. 30 tablet 1   ondansetron (ZOFRAN-ODT) 4 MG disintegrating tablet Take 1 tablet (4 mg total) by mouth every 6 (six) hours as needed for nausea. 20 tablet 0   prochlorperazine (COMPAZINE) 10 MG tablet Take 1 tablet (10 mg total) by mouth every 6 (six) hours as needed (Nausea or vomiting). 30 tablet 1   No current facility-administered medications for this visit.   Facility-Administered Medications Ordered in Other Visits  Medication Dose Route Frequency Provider Last Rate Last Admin   sodium chloride flush (NS) 0.9 % injection 10 mL  10 mL Intravenous PRN Nicholas Lose, MD   10 mL at 09/22/21 1314    PHYSICAL EXAMINATION: ECOG PERFORMANCE STATUS: 1 - Symptomatic but completely ambulatory  Vitals:   09/22/21 1336  BP: 97/68  Pulse: 82  Resp: 17  Temp: 97.8 F (36.6 C)  SpO2: 98%   Filed Weights   09/22/21 1336  Weight: 180 lb (81.6 kg)    LABORATORY DATA:  I have reviewed the data as listed CMP Latest Ref Rng & Units 07/16/2021 01/07/2021 05/17/2018  Glucose 70 - 99 mg/dL 104(H) 92 90  BUN 6 - 20 mg/dL '9 8 7  ' Creatinine 0.44 - 1.00 mg/dL 0.78 0.70 0.73  Sodium 135 - 145 mmol/L 139 138 141  Potassium 3.5 - 5.1 mmol/L 3.7 3.5 3.8  Chloride 98 - 111 mmol/L 102 103 101  CO2 22 - 32 mmol/L '27 27 26  ' Calcium 8.9 - 10.3 mg/dL 9.6 9.4 9.8  Total Protein 6.5 - 8.1 g/dL 7.8 7.2 7.5  Total Bilirubin 0.3 - 1.2 mg/dL 0.5 0.3 <0.2  Alkaline Phos 38 - 126 U/L 55 56 71  AST 15 - 41 U/L 14(L) 12 30  ALT 0 - 44 U/L 14 10 37(H)    Lab Results  Component Value Date   WBC 7.0 09/22/2021   HGB 12.1 09/22/2021   HCT 34.4 (L) 09/22/2021   MCV 90.1 09/22/2021   PLT 235 09/22/2021   NEUTROABS 3.8 09/22/2021    ASSESSMENT & PLAN:  Malignant neoplasm of upper-outer quadrant of right breast in female, estrogen receptor positive ([place]) 07/08/2021: Palpable right breast lump. Diagnostic mammogram: showed highly suspicious right breast mass and an indeterminate single right axillary  lymph node with up to 5 mm diffuse cortical thickening. Biopsy: grade 2-3 invasive ductal carcinoma, DCIS, and right axillary lymph node negative for metastatic carcinoma; ER+(80%)/PR-/Her2-.   Recommendations: 1. Right mastectomy: Grade 3 IDC 2.8 cm with high-grade DCIS, margins negative, 0/3 lymph nodes negative, ER 0%, PR 0%, HER2 negative, Ki-67 40% (final pathology is triple negative disease) 2. adjuvant chemotherapy with dose dense Adriamycin and Cytoxan followed by Taxol and carboplatin 3. Genetic testing -------------------------------------------------------------------------------------------------- Current treatment: Cycle 1 dose dense Adriamycin and Cytoxan Labs reviewed, chemo education completed, chemo consent obtained, antiemetics were reviewed Echocardiogram 09/15/2021: EF 60 to 65%  Return to clinic in 1 week for toxicity check  No orders of the defined types were placed in this encounter.  The  patient has a good understanding of the overall plan. she agrees with it. she will call with any problems that may develop before the next visit here.  Total time spent: 30 mins including face to face time and time spent for planning, charting and coordination of care  Rulon Eisenmenger, MD, MPH 09/22/2021  I, Thana Ates, am acting as scribe for Dr. Nicholas Lose.  I have reviewed the above documentation for accuracy and completeness, and I agree with the above.

## 2021-09-22 ENCOUNTER — Inpatient Hospital Stay: Payer: BC Managed Care – PPO

## 2021-09-22 ENCOUNTER — Inpatient Hospital Stay (HOSPITAL_BASED_OUTPATIENT_CLINIC_OR_DEPARTMENT_OTHER): Payer: BC Managed Care – PPO | Admitting: Hematology and Oncology

## 2021-09-22 ENCOUNTER — Encounter: Payer: Self-pay | Admitting: *Deleted

## 2021-09-22 ENCOUNTER — Inpatient Hospital Stay: Payer: BC Managed Care – PPO | Admitting: Genetic Counselor

## 2021-09-22 ENCOUNTER — Other Ambulatory Visit: Payer: Self-pay

## 2021-09-22 DIAGNOSIS — Z803 Family history of malignant neoplasm of breast: Secondary | ICD-10-CM | POA: Diagnosis not present

## 2021-09-22 DIAGNOSIS — Z170 Estrogen receptor positive status [ER+]: Secondary | ICD-10-CM

## 2021-09-22 DIAGNOSIS — Z95828 Presence of other vascular implants and grafts: Secondary | ICD-10-CM

## 2021-09-22 DIAGNOSIS — Z5111 Encounter for antineoplastic chemotherapy: Secondary | ICD-10-CM | POA: Diagnosis not present

## 2021-09-22 DIAGNOSIS — Z8042 Family history of malignant neoplasm of prostate: Secondary | ICD-10-CM | POA: Diagnosis not present

## 2021-09-22 DIAGNOSIS — Z79899 Other long term (current) drug therapy: Secondary | ICD-10-CM | POA: Diagnosis not present

## 2021-09-22 DIAGNOSIS — C50411 Malignant neoplasm of upper-outer quadrant of right female breast: Secondary | ICD-10-CM | POA: Diagnosis not present

## 2021-09-22 DIAGNOSIS — Z5189 Encounter for other specified aftercare: Secondary | ICD-10-CM | POA: Diagnosis not present

## 2021-09-22 DIAGNOSIS — Z171 Estrogen receptor negative status [ER-]: Secondary | ICD-10-CM | POA: Diagnosis not present

## 2021-09-22 DIAGNOSIS — Z9011 Acquired absence of right breast and nipple: Secondary | ICD-10-CM | POA: Diagnosis not present

## 2021-09-22 LAB — CBC WITH DIFFERENTIAL (CANCER CENTER ONLY)
Abs Immature Granulocytes: 0.03 10*3/uL (ref 0.00–0.07)
Basophils Absolute: 0.1 10*3/uL (ref 0.0–0.1)
Basophils Relative: 1 %
Eosinophils Absolute: 0.5 10*3/uL (ref 0.0–0.5)
Eosinophils Relative: 7 %
HCT: 34.4 % — ABNORMAL LOW (ref 36.0–46.0)
Hemoglobin: 12.1 g/dL (ref 12.0–15.0)
Immature Granulocytes: 0 %
Lymphocytes Relative: 29 %
Lymphs Abs: 2 10*3/uL (ref 0.7–4.0)
MCH: 31.7 pg (ref 26.0–34.0)
MCHC: 35.2 g/dL (ref 30.0–36.0)
MCV: 90.1 fL (ref 80.0–100.0)
Monocytes Absolute: 0.6 10*3/uL (ref 0.1–1.0)
Monocytes Relative: 8 %
Neutro Abs: 3.8 10*3/uL (ref 1.7–7.7)
Neutrophils Relative %: 55 %
Platelet Count: 235 10*3/uL (ref 150–400)
RBC: 3.82 MIL/uL — ABNORMAL LOW (ref 3.87–5.11)
RDW: 12.5 % (ref 11.5–15.5)
WBC Count: 7 10*3/uL (ref 4.0–10.5)
nRBC: 0 % (ref 0.0–0.2)

## 2021-09-22 LAB — CMP (CANCER CENTER ONLY)
ALT: 18 U/L (ref 0–44)
AST: 15 U/L (ref 15–41)
Albumin: 3.8 g/dL (ref 3.5–5.0)
Alkaline Phosphatase: 51 U/L (ref 38–126)
Anion gap: 7 (ref 5–15)
BUN: 7 mg/dL (ref 6–20)
CO2: 27 mmol/L (ref 22–32)
Calcium: 8.7 mg/dL — ABNORMAL LOW (ref 8.9–10.3)
Chloride: 107 mmol/L (ref 98–111)
Creatinine: 0.66 mg/dL (ref 0.44–1.00)
GFR, Estimated: >60 mL/min (ref >60)
Glucose, Bld: 111 mg/dL — ABNORMAL HIGH (ref 70–99)
Potassium: 3.9 mmol/L (ref 3.5–5.1)
Sodium: 141 mmol/L (ref 135–145)
Total Bilirubin: 0.4 mg/dL (ref 0.3–1.2)
Total Protein: 6.9 g/dL (ref 6.5–8.1)

## 2021-09-22 LAB — GENETIC SCREENING ORDER

## 2021-09-22 MED ORDER — SODIUM CHLORIDE 0.9% FLUSH
10.0000 mL | INTRAVENOUS | Status: DC | PRN
  Administered 2021-09-22: 17:00:00 10 mL

## 2021-09-22 MED ORDER — PALONOSETRON HCL INJECTION 0.25 MG/5ML
0.2500 mg | Freq: Once | INTRAVENOUS | Status: AC
  Administered 2021-09-22: 15:00:00 0.25 mg via INTRAVENOUS
  Filled 2021-09-22: qty 5

## 2021-09-22 MED ORDER — HEPARIN SOD (PORK) LOCK FLUSH 100 UNIT/ML IV SOLN
500.0000 [IU] | Freq: Once | INTRAVENOUS | Status: AC | PRN
  Administered 2021-09-22: 17:00:00 500 [IU]

## 2021-09-22 MED ORDER — SODIUM CHLORIDE 0.9% FLUSH
10.0000 mL | INTRAVENOUS | Status: DC | PRN
  Administered 2021-09-22: 13:00:00 10 mL via INTRAVENOUS

## 2021-09-22 MED ORDER — DOXORUBICIN HCL CHEMO IV INJECTION 2 MG/ML
60.0000 mg/m2 | Freq: Once | INTRAVENOUS | Status: AC
  Administered 2021-09-22: 16:00:00 114 mg via INTRAVENOUS
  Filled 2021-09-22: qty 57

## 2021-09-22 MED ORDER — SODIUM CHLORIDE 0.9 % IV SOLN: Freq: Once | INTRAVENOUS | Status: AC

## 2021-09-22 MED ORDER — SODIUM CHLORIDE 0.9 % IV SOLN
10.0000 mg | Freq: Once | INTRAVENOUS | Status: AC
  Administered 2021-09-22: 15:00:00 10 mg via INTRAVENOUS
  Filled 2021-09-22: qty 10

## 2021-09-22 MED ORDER — SODIUM CHLORIDE 0.9 % IV SOLN
150.0000 mg | Freq: Once | INTRAVENOUS | Status: AC
  Administered 2021-09-22: 15:00:00 150 mg via INTRAVENOUS
  Filled 2021-09-22: qty 150

## 2021-09-22 MED ORDER — SODIUM CHLORIDE 0.9 % IV SOLN
600.0000 mg/m2 | Freq: Once | INTRAVENOUS | Status: AC
  Administered 2021-09-22: 16:00:00 1140 mg via INTRAVENOUS
  Filled 2021-09-22: qty 57

## 2021-09-22 NOTE — Assessment & Plan Note (Signed)
07/08/2021:Palpable right breast lump. Diagnostic mammogram: showed highly suspicious right breast mass and an indeterminate single right axillary lymph node with up to 5 mm diffuse cortical thickening. Biopsy: grade 2-3 invasive ductal carcinoma, DCIS, and right axillary lymph node negative for metastatic carcinoma; ER+(80%)/PR-/Her2-.  Recommendations: 1. Right mastectomy: Grade 3 IDC 2.8 cm with high-grade DCIS, margins negative, 0/3 lymph nodes negative, ER 0%, PR 0%, HER2 negative, Ki-67 40% (final pathology is triple negative disease) 2. adjuvant chemotherapy with dose dense Adriamycin and Cytoxan followed by Taxol and carboplatin 3. Genetic testing -------------------------------------------------------------------------------------------------- Current treatment: Cycle 1 dose dense Adriamycin and Cytoxan Labs reviewed, chemo education completed, chemo consent obtained, antiemetics were reviewed Echocardiogram 09/15/2021: EF 60 to 65%  Return to clinic in 1 week for toxicity check

## 2021-09-22 NOTE — Patient Instructions (Signed)
[facility]  Discharge Instructions: Thank you for choosing [facility] to provide your oncology and hematology care.   If you have a lab appointment with the [facility], please go directly to the [facility] and check in at the registration area.   Wear comfortable clothing and clothing appropriate for easy access to any Portacath or PICC line.   We strive to give you quality time with your provider. You may need to reschedule your appointment if you arrive late (15 or more minutes).  Arriving late affects you and other patients whose appointments are after yours.  Also, if you miss three or more appointments without notifying the office, you may be dismissed from the clinic at the provider's discretion.      For prescription refill requests, have your pharmacy contact our office and allow 72 hours for refills to be completed.    Today you received the following chemotherapy and/or immunotherapy agents: Doxorubicin and cyclophosphamide      To help prevent nausea and vomiting after your treatment, we encourage you to take your nausea medication as directed.  BELOW ARE SYMPTOMS THAT SHOULD BE REPORTED IMMEDIATELY: *FEVER GREATER THAN 100.4 F (38 C) OR HIGHER *CHILLS OR SWEATING *NAUSEA AND VOMITING THAT IS NOT CONTROLLED WITH YOUR NAUSEA MEDICATION *UNUSUAL SHORTNESS OF BREATH *UNUSUAL BRUISING OR BLEEDING *URINARY PROBLEMS (pain or burning when urinating, or frequent urination) *BOWEL PROBLEMS (unusual diarrhea, constipation, pain near the anus) TENDERNESS IN MOUTH AND THROAT WITH OR WITHOUT PRESENCE OF ULCERS (sore throat, sores in mouth, or a toothache) UNUSUAL RASH, SWELLING OR PAIN  UNUSUAL VAGINAL DISCHARGE OR ITCHING   Items with * indicate a potential emergency and should be followed up as soon as possible or go to the Emergency Department if any problems should occur.  Please show the CHEMOTHERAPY ALERT CARD or IMMUNOTHERAPY  ALERT CARD at check-in to the Emergency Department and triage nurse.  Should you have questions after your visit or need to cancel or reschedule your appointment, please contact [facility]  Dept: [phone]  and follow the prompts.  Office hours are 8:00 a.m. to 4:30 p.m. Monday - Friday. Please note that voicemails left after 4:00 p.m. may not be returned until the following business day.  We are closed weekends and major holidays. You have access to a nurse at all times for urgent questions. Please call the main number to the clinic Dept: [phone] and follow the prompts.   For any non-urgent questions, you may also contact your provider using MyChart. We now offer e-Visits for anyone 28 and older to request care online for non-urgent symptoms. For details visit [email or web address].   Also download the MyChart app! Go to the app store, search "MyChart", open the app, select [facility], and log in with your MyChart username and password.  Due to Covid, a mask is required upon entering the hospital/clinic. If you do not have a mask, one will be given to you upon arrival. For doctor visits, patients may have 1 support person aged 62 or older with them. For treatment visits, patients cannot have anyone with them due to current Covid guidelines and our immunocompromised population.   Doxorubicin injection What is this medication? DOXORUBICIN (dox oh ROO bi sin) is a chemotherapy drug. It is used to treat many kinds of cancer like leukemia, lymphoma, neuroblastoma, sarcoma, and Wilms' tumor. It is also used to treat bladder cancer, breast cancer, lung cancer, ovarian cancer,  stomach cancer, and thyroid cancer. This medicine may be used for other purposes; ask your health care provider or pharmacist if you have questions. COMMON BRAND NAME(S): Adriamycin, Adriamycin PFS, Adriamycin RDF, Rubex What should I tell my care team before I take this medication? They need  to know if you have any of these conditions: heart disease history of low blood counts caused by a medicine liver disease recent or ongoing radiation therapy an unusual or allergic reaction to doxorubicin, other chemotherapy agents, other medicines, foods, dyes, or preservatives pregnant or trying to get pregnant breast-feeding How should I use this medication? This drug is given as an infusion into a vein. It is administered in a hospital or clinic by a specially trained health care professional. If you have pain, swelling, burning or any unusual feeling around the site of your injection, tell your health care professional right away. Talk to your pediatrician regarding the use of this medicine in children. Special care may be needed. Overdosage: If you think you have taken too much of this medicine contact a poison control center or emergency room at once. NOTE: This medicine is only for you. Do not share this medicine with others. What if I miss a dose? It is important not to miss your dose. Call your doctor or health care professional if you are unable to keep an appointment. What may interact with this medication? This medicine may interact with the following medications: 6-mercaptopurine paclitaxel phenytoin St. John's Wort trastuzumab verapamil This list may not describe all possible interactions. Give your health care provider a list of all the medicines, herbs, non-prescription drugs, or dietary supplements you use. Also tell them if you smoke, drink alcohol, or use illegal drugs. Some items may interact with your medicine. What should I watch for while using this medication? This drug may make you feel generally unwell. This is not uncommon, as chemotherapy can affect healthy cells as well as cancer cells. Report any side effects. Continue your course of treatment even though you feel ill unless your doctor tells you to stop. There is a maximum amount of this medicine you should  receive throughout your life. The amount depends on the medical condition being treated and your overall health. Your doctor will watch how much of this medicine you receive in your lifetime. Tell your doctor if you have taken this medicine before. You may need blood work done while you are taking this medicine. Your urine may turn red for a few days after your dose. This is not blood. If your urine is dark or brown, call your doctor. In some cases, you may be given additional medicines to help with side effects. Follow all directions for their use. Call your doctor or health care professional for advice if you get a fever, chills or sore throat, or other symptoms of a cold or flu. Do not treat yourself. This drug decreases your body's ability to fight infections. Try to avoid being around people who are sick. This medicine may increase your risk to bruise or bleed. Call your doctor or health care professional if you notice any unusual bleeding. Talk to your doctor about your risk of cancer. You may be more at risk for certain types of cancers if you take this medicine. Do not become pregnant while taking this medicine or for 6 months after stopping it. Women should inform their doctor if they wish to become pregnant or think they might be pregnant. Men should not father a child while  taking this medicine and for 6 months after stopping it. There is a potential for serious side effects to an unborn child. Talk to your health care professional or pharmacist for more information. Do not breast-feed an infant while taking this medicine. This medicine has caused ovarian failure in some women and reduced sperm counts in some men This medicine may interfere with the ability to have a child. Talk with your doctor or health care professional if you are concerned about your fertility. This medicine may cause a decrease in Co-Enzyme Q-10. You should make sure that you get enough Co-Enzyme Q-10 while you are taking  this medicine. Discuss the foods you eat and the vitamins you take with your health care professional. What side effects may I notice from receiving this medication? Side effects that you should report to your doctor or health care professional as soon as possible: allergic reactions like skin rash, itching or hives, swelling of the face, lips, or tongue breathing problems chest pain fast or irregular heartbeat low blood counts - this medicine may decrease the number of white blood cells, red blood cells and platelets. You may be at increased risk for infections and bleeding. pain, redness, or irritation at site where injected signs of infection - fever or chills, cough, sore throat, pain or difficulty passing urine signs of decreased platelets or bleeding - bruising, pinpoint red spots on the skin, black, tarry stools, blood in the urine swelling of the ankles, feet, hands tiredness weakness Side effects that usually do not require medical attention (report to your doctor or health care professional if they continue or are bothersome): diarrhea hair loss mouth sores nail discoloration or damage nausea red colored urine vomiting This list may not describe all possible side effects. Call your doctor for medical advice about side effects. You may report side effects to FDA at 1-800-FDA-1088. Where should I keep my medication? This drug is given in a hospital or clinic and will not be stored at home. NOTE: This sheet is a summary. It may not cover all possible information. If you have questions about this medicine, talk to your doctor, pharmacist, or health care provider.  2022 Elsevier/Gold Standard (2017-06-17 00:00:00)  Cyclophosphamide Injection What is this medication? CYCLOPHOSPHAMIDE (sye kloe FOSS fa mide) is a chemotherapy drug. It slows the growth of cancer cells. This medicine is used to treat many types of cancer like lymphoma, myeloma, leukemia, breast cancer, and ovarian  cancer, to name a few. This medicine may be used for other purposes; ask your health care provider or pharmacist if you have questions. COMMON BRAND NAME(S): Cytoxan, Neosar What should I tell my care team before I take this medication? They need to know if you have any of these conditions: heart disease history of irregular heartbeat infection kidney disease liver disease low blood counts, like white cells, platelets, or red blood cells on hemodialysis recent or ongoing radiation therapy scarring or thickening of the lungs trouble passing urine an unusual or allergic reaction to cyclophosphamide, other medicines, foods, dyes, or preservatives pregnant or trying to get pregnant breast-feeding How should I use this medication? This drug is usually given as an injection into a vein or muscle or by infusion into a vein. It is administered in a hospital or clinic by a specially trained health care professional. Talk to your pediatrician regarding the use of this medicine in children. Special care may be needed. Overdosage: If you think you have taken too much of this medicine contact  a poison control center or emergency room at once. NOTE: This medicine is only for you. Do not share this medicine with others. What if I miss a dose? It is important not to miss your dose. Call your doctor or health care professional if you are unable to keep an appointment. What may interact with this medication? amphotericin B azathioprine certain antivirals for HIV or hepatitis certain medicines for blood pressure, heart disease, irregular heart beat certain medicines that treat or prevent blood clots like warfarin certain other medicines for cancer cyclosporine etanercept indomethacin medicines that relax muscles for surgery medicines to increase blood counts metronidazole This list may not describe all possible interactions. Give your health care provider a list of all the medicines, herbs,  non-prescription drugs, or dietary supplements you use. Also tell them if you smoke, drink alcohol, or use illegal drugs. Some items may interact with your medicine. What should I watch for while using this medication? Your condition will be monitored carefully while you are receiving this medicine. You may need blood work done while you are taking this medicine. Drink water or other fluids as directed. Urinate often, even at night. Some products may contain alcohol. Ask your health care professional if this medicine contains alcohol. Be sure to tell all health care professionals you are taking this medicine. Certain medicines, like metronidazole and disulfiram, can cause an unpleasant reaction when taken with alcohol. The reaction includes flushing, headache, nausea, vomiting, sweating, and increased thirst. The reaction can last from 30 minutes to several hours. Do not become pregnant while taking this medicine or for 1 year after stopping it. Women should inform their health care professional if they wish to become pregnant or think they might be pregnant. Men should not father a child while taking this medicine and for 4 months after stopping it. There is potential for serious side effects to an unborn child. Talk to your health care professional for more information. Do not breast-feed an infant while taking this medicine or for 1 week after stopping it. This medicine has caused ovarian failure in some women. This medicine may make it more difficult to get pregnant. Talk to your health care professional if you are concerned about your fertility. This medicine has caused decreased sperm counts in some men. This may make it more difficult to father a child. Talk to your health care professional if you are concerned about your fertility. Call your health care professional for advice if you get a fever, chills, or sore throat, or other symptoms of a cold or flu. Do not treat yourself. This medicine  decreases your body's ability to fight infections. Try to avoid being around people who are sick. Avoid taking medicines that contain aspirin, acetaminophen, ibuprofen, naproxen, or ketoprofen unless instructed by your health care professional. These medicines may hide a fever. Talk to your health care professional about your risk of cancer. You may be more at risk for certain types of cancer if you take this medicine. If you are going to need surgery or other procedure, tell your health care professional that you are using this medicine. Be careful brushing or flossing your teeth or using a toothpick because you may get an infection or bleed more easily. If you have any dental work done, tell your dentist you are receiving this medicine. What side effects may I notice from receiving this medication? Side effects that you should report to your doctor or health care professional as soon as possible: allergic reactions like  skin rash, itching or hives, swelling of the face, lips, or tongue breathing problems nausea, vomiting signs and symptoms of bleeding such as bloody or black, tarry stools; red or dark brown urine; spitting up blood or brown material that looks like coffee grounds; red spots on the skin; unusual bruising or bleeding from the eyes, gums, or nose signs and symptoms of heart failure like fast, irregular heartbeat, sudden weight gain; swelling of the ankles, feet, hands signs and symptoms of infection like fever; chills; cough; sore throat; pain or trouble passing urine signs and symptoms of kidney injury like trouble passing urine or change in the amount of urine signs and symptoms of liver injury like dark yellow or brown urine; general ill feeling or flu-like symptoms; light-colored stools; loss of appetite; nausea; right upper belly pain; unusually weak or tired; yellowing of the eyes or skin Side effects that usually do not require medical attention (report to your doctor or health  care professional if they continue or are bothersome): confusion decreased hearing diarrhea facial flushing hair loss headache loss of appetite missed menstrual periods signs and symptoms of low red blood cells or anemia such as unusually weak or tired; feeling faint or lightheaded; falls skin discoloration This list may not describe all possible side effects. Call your doctor for medical advice about side effects. You may report side effects to FDA at 1-800-FDA-1088. Where should I keep my medication? This drug is given in a hospital or clinic and will not be stored at home. NOTE: This sheet is a summary. It may not cover all possible information. If you have questions about this medicine, talk to your doctor, pharmacist, or health care provider.  2022 Elsevier/Gold Standard (2021-07-01 00:00:00)

## 2021-09-22 NOTE — Progress Notes (Signed)
Per Dr. Maryelizabeth Kaufmann pt right chest port a cath shows tip in SVC.  States addendum will be added this afternoon once he is out of surgery.  MD notified and verbalized okay to proceed with tx and labs through port today.

## 2021-09-23 ENCOUNTER — Encounter: Payer: Self-pay | Admitting: Hematology and Oncology

## 2021-09-23 NOTE — Progress Notes (Signed)
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.  Pt gave me consent to apply in her behalf so I completed the online app for [employer].  I will notify her of the outcome once I receive it.  I also informed her of the [employer] and went over what it covers.  Pt would like to apply and since she's not receiving any income right now she will provide a letter of support on 09/24/21.  Once received I will approve her for the [employer].  I will give her my card for any questions or concerns she may have in the future.

## 2021-09-24 ENCOUNTER — Inpatient Hospital Stay: Payer: BC Managed Care – PPO

## 2021-09-24 ENCOUNTER — Encounter: Payer: Self-pay | Admitting: Hematology and Oncology

## 2021-09-24 ENCOUNTER — Encounter: Payer: Self-pay | Admitting: *Deleted

## 2021-09-24 ENCOUNTER — Other Ambulatory Visit: Payer: Self-pay

## 2021-09-24 VITALS — BP 116/67 | HR 67 | Temp 98.6°F | Resp 18

## 2021-09-24 DIAGNOSIS — Z170 Estrogen receptor positive status [ER+]: Secondary | ICD-10-CM

## 2021-09-24 DIAGNOSIS — C50411 Malignant neoplasm of upper-outer quadrant of right female breast: Secondary | ICD-10-CM

## 2021-09-24 DIAGNOSIS — Z9011 Acquired absence of right breast and nipple: Secondary | ICD-10-CM | POA: Diagnosis not present

## 2021-09-24 DIAGNOSIS — Z79899 Other long term (current) drug therapy: Secondary | ICD-10-CM | POA: Diagnosis not present

## 2021-09-24 DIAGNOSIS — Z5189 Encounter for other specified aftercare: Secondary | ICD-10-CM | POA: Diagnosis not present

## 2021-09-24 DIAGNOSIS — Z171 Estrogen receptor negative status [ER-]: Secondary | ICD-10-CM | POA: Diagnosis not present

## 2021-09-24 DIAGNOSIS — Z5111 Encounter for antineoplastic chemotherapy: Secondary | ICD-10-CM | POA: Diagnosis not present

## 2021-09-24 MED ORDER — PEGFILGRASTIM-CBQV 6 MG/0.6ML ~~LOC~~ SOSY
6.0000 mg | PREFILLED_SYRINGE | Freq: Once | SUBCUTANEOUS | Status: AC
  Administered 2021-09-24: 6 mg via SUBCUTANEOUS
  Filled 2021-09-24: qty 0.6

## 2021-09-24 NOTE — Patient Instructions (Signed)

## 2021-09-24 NOTE — Progress Notes (Signed)
Pt is approved for the $1000 [employer] grant.  

## 2021-09-28 NOTE — Progress Notes (Signed)
Patient Care Team: Crecencio Mc, MD as PCP - General (Internal Medicine) Mauro Kaufmann, RN as Oncology Nurse Navigator Rockwell Germany, RN as Oncology Nurse Navigator Jovita Kussmaul, MD as Consulting Physician (General Surgery) Nicholas Lose, MD as Consulting Physician (Hematology and Oncology) Eppie Gibson, MD as Attending Physician (Radiation Oncology)  DIAGNOSIS:    ICD-10-CM   1. Malignant neoplasm of upper-outer quadrant of right breast in female, estrogen receptor negative ([place])  C50.411    Z17.1       SUMMARY OF ONCOLOGIC HISTORY: Oncology History  Malignant neoplasm of upper-outer quadrant of right breast in female, estrogen receptor negative ([place])  07/08/2021 Initial Diagnosis   Palpable right breast lump. Diagnostic mammogram: showed highly suspicious right breast mass and an indeterminate single right axillary lymph node with up to 5 mm diffuse cortical thickening. Biopsy: grade 2-3 invasive ductal carcinoma, DCIS, and right axillary lymph node negative for metastatic carcinoma; ER+(80%)/PR-/Her2-.   07/16/2021 Cancer Staging   Staging form: Breast, AJCC 8th Edition - Clinical stage from 07/16/2021: Stage IIB (cT2, cN0, cM0, G3, ER+, PR-, HER2-) - Signed by Nicholas Lose, MD on 07/16/2021 Stage prefix: Initial diagnosis Histologic grading system: 3 grade system    08/18/2021 Surgery   Right mastectomy: Grade 3 IDC 2.8 cm with high-grade DCIS, margins negative, 0/3 lymph nodes negative, ER 0%, PR 0%, HER2 negative, Ki-67 60% (triple negative)   09/22/2021 -  Chemotherapy   Patient is on Treatment Plan : BREAST Dose Dense AC q14d / CARBOplatin D1 + PACLitaxel D1,8,15 q21d       CHIEF COMPLIANT: Follow-up of right breast cancer  INTERVAL HISTORY: Katelyn Hobbs is a 45 y.o. with above-mentioned history of right breast cancer having undergone bilateral mastectomies, to start chemotherapy with dose dense AC. She presents to the clinic today for follow-up and  toxicity check.  Patient had mild cellulitis below the breast and was treated with doxycycline.  She also had fluid drained from under the arm. After first cycle of chemotherapy she had mild fatigue and mild body aches and pains but all of which are manageable.  She has taken Zofran round-the-clock which is helped prevent any nausea.  ALLERGIES:  is allergic to benadryl [diphenhydramine], latex, and phenergan [promethazine hcl].  MEDICATIONS:  Current Outpatient Medications  Medication Sig Dispense Refill   acetaminophen (TYLENOL) 500 MG tablet Take 1,000 mg by mouth every 8 (eight) hours as needed for moderate pain.     ALPRAZolam (XANAX XR) 2 MG 24 hr tablet TAKE 1 TABLET BY MOUTH EVERYDAY AT BEDTIME 30 tablet 2   cetirizine (ZYRTEC) 10 MG tablet Take 10 mg by mouth at bedtime.     citalopram (CELEXA) 40 MG tablet Take 1 tablet (40 mg total) by mouth daily. 90 tablet 1   dexamethasone (DECADRON) 4 MG tablet Take 1 tablet (4 mg total) by mouth daily. Take 1 tablet day after chemo and 1 tablet 2 days after chemo with food. 12 tablet 0   diltiazem (CARDIZEM) 30 MG tablet TAKE 1 TABLET BY MOUTH AS NEEDED FOR TACHYCARDIA (Patient not taking: No sig reported) 90 tablet 1   ergocalciferol (DRISDOL) 1.25 MG (50000 UT) capsule Take 1 capsule (50,000 Units total) by mouth once a week. 12 capsule 0   HYDROcodone-acetaminophen (NORCO/VICODIN) 5-325 MG tablet Take 1 tablet by mouth every 6 (six) hours as needed for moderate pain. 15 tablet 0   ibuprofen (ADVIL) 200 MG tablet Take 800 mg by mouth every 8 (eight)  hours as needed for moderate pain.     lidocaine-prilocaine (EMLA) cream Apply to affected area once 30 g 3   LORazepam (ATIVAN) 0.5 MG tablet Take 1 tablet (0.5 mg total) by mouth at bedtime as needed for sleep. 30 tablet 0   methocarbamol (ROBAXIN) 500 MG tablet Take 1 tablet (500 mg total) by mouth every 6 (six) hours as needed for muscle spasms. 30 tablet 2   ondansetron (ZOFRAN) 8 MG tablet Take  1 tablet (8 mg total) by mouth 2 (two) times daily as needed. Start on the third day after chemotherapy. 30 tablet 1   ondansetron (ZOFRAN-ODT) 4 MG disintegrating tablet Take 1 tablet (4 mg total) by mouth every 6 (six) hours as needed for nausea. 20 tablet 0   prochlorperazine (COMPAZINE) 10 MG tablet Take 1 tablet (10 mg total) by mouth every 6 (six) hours as needed (Nausea or vomiting). 30 tablet 1   No current facility-administered medications for this visit.    PHYSICAL EXAMINATION: ECOG PERFORMANCE STATUS: 1 - Symptomatic but completely ambulatory  Vitals:   09/29/21 0932  BP: 107/68  Pulse: 76  Resp: 17  Temp: (!) 97.2 F (36.2 C)  SpO2: 98%   Filed Weights   09/29/21 0932  Weight: 176 lb 3.2 oz (79.9 kg)      LABORATORY DATA:  I have reviewed the data as listed CMP Latest Ref Rng & Units 09/29/2021 09/22/2021 07/16/2021  Glucose 70 - 99 mg/dL 77 111(H) 104(H)  BUN 6 - 20 mg/dL '8 7 9  ' Creatinine 0.44 - 1.00 mg/dL 0.69 0.66 0.78  Sodium 135 - 145 mmol/L 141 141 139  Potassium 3.5 - 5.1 mmol/L 3.9 3.9 3.7  Chloride 98 - 111 mmol/L 105 107 102  CO2 22 - 32 mmol/L '28 27 27  ' Calcium 8.9 - 10.3 mg/dL 9.6 8.7(L) 9.6  Total Protein 6.5 - 8.1 g/dL 7.3 6.9 7.8  Total Bilirubin 0.3 - 1.2 mg/dL 0.3 0.4 0.5  Alkaline Phos 38 - 126 U/L 80 51 55  AST 15 - 41 U/L 15 15 14(L)  ALT 0 - 44 U/L '14 18 14    ' Lab Results  Component Value Date   WBC 7.3 09/29/2021   HGB 12.3 09/29/2021   HCT 36.2 09/29/2021   MCV 92.3 09/29/2021   PLT 228 09/29/2021   NEUTROABS 4.1 09/29/2021    ASSESSMENT & PLAN:  Malignant neoplasm of upper-outer quadrant of right breast in female, estrogen receptor Negative ([place]) 07/08/2021: Palpable right breast lump. Diagnostic mammogram: showed highly suspicious right breast mass and an indeterminate single right axillary lymph node with up to 5 mm diffuse cortical thickening. Biopsy: grade 2-3 invasive ductal carcinoma, DCIS, and right axillary lymph node  negative for metastatic carcinoma; ER+(80%)/PR-/Her2-.  On the final pathology her estrogen receptor is negative and therefore patient has triple negative breast cancer   Recommendations: 1. Right mastectomy: Grade 3 IDC 2.8 cm with high-grade DCIS, margins negative, 0/3 lymph nodes negative, ER 0%, PR 0%, HER2 negative, Ki-67 40% (final pathology is triple negative disease) 2. adjuvant chemotherapy with dose dense Adriamycin and Cytoxan followed by Taxol and carboplatin 3. Genetic testing -------------------------------------------------------------------------------------------------- Current treatment: Cycle 1 day 8 dose dense Adriamycin and Cytoxan Chemo Toxicities: Denies any nausea or vomiting. She had fatigue for a few days and then got better. Reviewed her blood counts.  Echocardiogram 09/15/2021: EF 60 to 65%   Return to clinic in 1 week for cycle 2    No orders of  the defined types were placed in this encounter.  The patient has a good understanding of the overall plan. she agrees with it. she will call with any problems that may develop before the next visit here.  Total time spent: 30 mins including face to face time and time spent for planning, charting and coordination of care  Rulon Eisenmenger, MD, MPH 09/29/2021  I, Thana Ates, am acting as scribe for Dr. Nicholas Lose.  I have reviewed the above documentation for accuracy and completeness, and I agree with the above.

## 2021-09-28 NOTE — Assessment & Plan Note (Signed)
07/08/2021:Palpable right breast lump. Diagnostic mammogram: showed highly suspicious right breast mass and an indeterminate single right axillary lymph node with up to 5 mm diffuse cortical thickening. Biopsy: grade 2-3 invasive ductal carcinoma, DCIS, and right axillary lymph node negative for metastatic carcinoma; ER+(80%)/PR-/Her2-.  Recommendations: 1.Right mastectomy: Grade 3 IDC 2.8 cm with high-grade DCIS, margins negative, 0/3 lymph nodes negative, ER 0%, PR 0%, HER2 negative, Ki-[id number]%(final pathology is triple negative disease) 2.adjuvant chemotherapy with dose dense Adriamycin and Cytoxan followed by Taxol and carboplatin 3. Genetic testing -------------------------------------------------------------------------------------------------- Current treatment: Cycle 1 day 8 dose dense Adriamycin and Cytoxan Chemo Toxicities:  Echocardiogram 09/15/2021: EF 60 to 65%  Return to clinic in 1 week for cycle 2

## 2021-09-29 ENCOUNTER — Encounter: Payer: Self-pay | Admitting: *Deleted

## 2021-09-29 ENCOUNTER — Other Ambulatory Visit: Payer: Self-pay

## 2021-09-29 ENCOUNTER — Inpatient Hospital Stay: Payer: BC Managed Care – PPO | Attending: Hematology and Oncology

## 2021-09-29 ENCOUNTER — Inpatient Hospital Stay (HOSPITAL_BASED_OUTPATIENT_CLINIC_OR_DEPARTMENT_OTHER): Payer: BC Managed Care – PPO | Admitting: Hematology and Oncology

## 2021-09-29 DIAGNOSIS — C50411 Malignant neoplasm of upper-outer quadrant of right female breast: Secondary | ICD-10-CM | POA: Insufficient documentation

## 2021-09-29 DIAGNOSIS — Z171 Estrogen receptor negative status [ER-]: Secondary | ICD-10-CM

## 2021-09-29 DIAGNOSIS — Z9221 Personal history of antineoplastic chemotherapy: Secondary | ICD-10-CM | POA: Insufficient documentation

## 2021-09-29 DIAGNOSIS — Z5111 Encounter for antineoplastic chemotherapy: Secondary | ICD-10-CM | POA: Insufficient documentation

## 2021-09-29 DIAGNOSIS — Z9013 Acquired absence of bilateral breasts and nipples: Secondary | ICD-10-CM | POA: Insufficient documentation

## 2021-09-29 DIAGNOSIS — Z95828 Presence of other vascular implants and grafts: Secondary | ICD-10-CM

## 2021-09-29 DIAGNOSIS — Z79899 Other long term (current) drug therapy: Secondary | ICD-10-CM | POA: Diagnosis not present

## 2021-09-29 DIAGNOSIS — Z170 Estrogen receptor positive status [ER+]: Secondary | ICD-10-CM

## 2021-09-29 LAB — CMP (CANCER CENTER ONLY)
ALT: 14 U/L (ref 0–44)
AST: 15 U/L (ref 15–41)
Albumin: 4.1 g/dL (ref 3.5–5.0)
Alkaline Phosphatase: 80 U/L (ref 38–126)
Anion gap: 8 (ref 5–15)
BUN: 8 mg/dL (ref 6–20)
CO2: 28 mmol/L (ref 22–32)
Calcium: 9.6 mg/dL (ref 8.9–10.3)
Chloride: 105 mmol/L (ref 98–111)
Creatinine: 0.69 mg/dL (ref 0.44–1.00)
GFR, Estimated: >60 mL/min (ref >60)
Glucose, Bld: 77 mg/dL (ref 70–99)
Potassium: 3.9 mmol/L (ref 3.5–5.1)
Sodium: 141 mmol/L (ref 135–145)
Total Bilirubin: 0.3 mg/dL (ref 0.3–1.2)
Total Protein: 7.3 g/dL (ref 6.5–8.1)

## 2021-09-29 LAB — CBC WITH DIFFERENTIAL (CANCER CENTER ONLY)
Abs Immature Granulocytes: 0.23 10*3/uL — ABNORMAL HIGH (ref 0.00–0.07)
Basophils Absolute: 0.1 10*3/uL (ref 0.0–0.1)
Basophils Relative: 2 %
Eosinophils Absolute: 0.5 10*3/uL (ref 0.0–0.5)
Eosinophils Relative: 7 %
HCT: 36.2 % (ref 36.0–46.0)
Hemoglobin: 12.3 g/dL (ref 12.0–15.0)
Immature Granulocytes: 3 %
Lymphocytes Relative: 21 %
Lymphs Abs: 1.5 10*3/uL (ref 0.7–4.0)
MCH: 31.4 pg (ref 26.0–34.0)
MCHC: 34 g/dL (ref 30.0–36.0)
MCV: 92.3 fL (ref 80.0–100.0)
Monocytes Absolute: 0.9 10*3/uL (ref 0.1–1.0)
Monocytes Relative: 12 %
Neutro Abs: 4.1 10*3/uL (ref 1.7–7.7)
Neutrophils Relative %: 55 %
Platelet Count: 228 10*3/uL (ref 150–400)
RBC: 3.92 MIL/uL (ref 3.87–5.11)
RDW: 12.4 % (ref 11.5–15.5)
Smear Review: NORMAL
WBC Count: 7.3 10*3/uL (ref 4.0–10.5)
nRBC: 0 % (ref 0.0–0.2)

## 2021-09-29 MED ORDER — HEPARIN SOD (PORK) LOCK FLUSH 100 UNIT/ML IV SOLN
500.0000 [IU] | Freq: Once | INTRAVENOUS | Status: AC
  Administered 2021-09-29: 500 [IU] via INTRAVENOUS

## 2021-09-29 MED ORDER — SODIUM CHLORIDE 0.9% FLUSH
10.0000 mL | INTRAVENOUS | Status: DC | PRN
  Administered 2021-09-29: 10 mL via INTRAVENOUS

## 2021-10-03 ENCOUNTER — Encounter: Payer: Self-pay | Admitting: Hematology and Oncology

## 2021-10-03 NOTE — Progress Notes (Signed)
Pt is enrolled in the [employer] Complete program for [employer] for $15,000 for 12 months from 10/02/21.  Pt is eligible to have $0 OOP costs for each [employer]den[employer].

## 2021-10-06 MED FILL — Dexamethasone Sodium Phosphate Inj 100 MG/10ML: INTRAMUSCULAR | Qty: 1 | Status: AC

## 2021-10-06 MED FILL — Fosaprepitant Dimeglumine For IV Infusion 150 MG (Base Eq): INTRAVENOUS | Qty: 5 | Status: AC

## 2021-10-06 NOTE — Assessment & Plan Note (Signed)
07/08/2021:Palpable right breast lump. Diagnostic mammogram: showed highly suspicious right breast mass and an indeterminate single right axillary lymph node with up to 5 mm diffuse cortical thickening. Biopsy: grade 2-3 invasive ductal carcinoma, DCIS, and right axillary lymph node negative for metastatic carcinoma; ER+(80%)/PR-/Her2-.  On the final pathology her estrogen receptor is negative and therefore patient has triple negative breast cancer  Recommendations: 1.Right mastectomy: Grade 3 IDC 2.8 cm with high-grade DCIS, margins negative, 0/3 lymph nodes negative, ER 0%, PR 0%, HER2 negative, Ki-6740%(final pathology is triple negative disease) 2.adjuvant chemotherapy with dose dense Adriamycin and Cytoxan followed by Taxol and carboplatin 3. Genetic testing -------------------------------------------------------------------------------------------------- Current treatment: Cycle 2 dose dense Adriamycin and Cytoxan Chemo Toxicities: Denies any nausea or vomiting. She had fatigue for a few days and then got better. Reviewed her blood counts.  Echocardiogram 09/15/2021: EF 60 to 65%  Return to clinic in 2 weeks for cycle 3

## 2021-10-06 NOTE — Progress Notes (Signed)
Patient Care Team: Crecencio Mc, MD as PCP - General (Internal Medicine) Mauro Kaufmann, RN as Oncology Nurse Navigator Rockwell Germany, RN as Oncology Nurse Navigator Jovita Kussmaul, MD as Consulting Physician (General Surgery) Nicholas Lose, MD as Consulting Physician (Hematology and Oncology) Eppie Gibson, MD as Attending Physician (Radiation Oncology)  DIAGNOSIS:    ICD-10-CM   1. Malignant neoplasm of upper-outer quadrant of right breast in female, estrogen receptor negative ([place])  C50.411    Z17.1       SUMMARY OF ONCOLOGIC HISTORY: Oncology History  Malignant neoplasm of upper-outer quadrant of right breast in female, estrogen receptor negative ([place])  07/08/2021 Initial Diagnosis   Palpable right breast lump. Diagnostic mammogram: showed highly suspicious right breast mass and an indeterminate single right axillary lymph node with up to 5 mm diffuse cortical thickening. Biopsy: grade 2-3 invasive ductal carcinoma, DCIS, and right axillary lymph node negative for metastatic carcinoma; ER+(80%)/PR-/Her2-.   07/16/2021 Cancer Staging   Staging form: Breast, AJCC 8th Edition - Clinical stage from 07/16/2021: Stage IIB (cT2, cN0, cM0, G3, ER+, PR-, HER2-) - Signed by Nicholas Lose, MD on 07/16/2021 Stage prefix: Initial diagnosis Histologic grading system: 3 grade system    08/18/2021 Surgery   Right mastectomy: Grade 3 IDC 2.8 cm with high-grade DCIS, margins negative, 0/3 lymph nodes negative, ER 0%, PR 0%, HER2 negative, Ki-67 60% (triple negative)   09/22/2021 -  Chemotherapy   Patient is on Treatment Plan : BREAST Dose Dense AC q14d / CARBOplatin D1 + PACLitaxel D1,8,15 q21d       CHIEF COMPLIANT: Cycle 2 AC  INTERVAL HISTORY: Katelyn Hobbs is a 45 y.o. with above-mentioned history of right breast cancer having undergone bilateral mastectomies, currently on chemotherapy with dose dense AC. She presents to the clinic today for follow-up and toxicity check.    ALLERGIES:  is allergic to benadryl [diphenhydramine], latex, and phenergan [promethazine hcl].  MEDICATIONS:  Current Outpatient Medications  Medication Sig Dispense Refill   acetaminophen (TYLENOL) 500 MG tablet Take 1,000 mg by mouth every 8 (eight) hours as needed for moderate pain.     ALPRAZolam (XANAX XR) 2 MG 24 hr tablet TAKE 1 TABLET BY MOUTH EVERYDAY AT BEDTIME 30 tablet 2   cetirizine (ZYRTEC) 10 MG tablet Take 10 mg by mouth at bedtime.     citalopram (CELEXA) 40 MG tablet Take 1 tablet (40 mg total) by mouth daily. 90 tablet 1   dexamethasone (DECADRON) 4 MG tablet Take 1 tablet (4 mg total) by mouth daily. Take 1 tablet day after chemo and 1 tablet 2 days after chemo with food. 12 tablet 0   diltiazem (CARDIZEM) 30 MG tablet TAKE 1 TABLET BY MOUTH AS NEEDED FOR TACHYCARDIA (Patient not taking: No sig reported) 90 tablet 1   ergocalciferol (DRISDOL) 1.25 MG (50000 UT) capsule Take 1 capsule (50,000 Units total) by mouth once a week. 12 capsule 0   HYDROcodone-acetaminophen (NORCO/VICODIN) 5-325 MG tablet Take 1 tablet by mouth every 6 (six) hours as needed for moderate pain. 15 tablet 0   ibuprofen (ADVIL) 200 MG tablet Take 800 mg by mouth every 8 (eight) hours as needed for moderate pain.     lidocaine-prilocaine (EMLA) cream Apply to affected area once 30 g 3   LORazepam (ATIVAN) 0.5 MG tablet Take 1 tablet (0.5 mg total) by mouth at bedtime as needed for sleep. 30 tablet 0   methocarbamol (ROBAXIN) 500 MG tablet Take 1 tablet (500 mg  total) by mouth every 6 (six) hours as needed for muscle spasms. 30 tablet 2   ondansetron (ZOFRAN) 8 MG tablet Take 1 tablet (8 mg total) by mouth 2 (two) times daily as needed. Start on the third day after chemotherapy. 30 tablet 1   ondansetron (ZOFRAN-ODT) 4 MG disintegrating tablet Take 1 tablet (4 mg total) by mouth every 6 (six) hours as needed for nausea. 20 tablet 0   prochlorperazine (COMPAZINE) 10 MG tablet Take 1 tablet (10 mg total)  by mouth every 6 (six) hours as needed (Nausea or vomiting). 30 tablet 1   No current facility-administered medications for this visit.    PHYSICAL EXAMINATION: ECOG PERFORMANCE STATUS: 1 - Symptomatic but completely ambulatory  Vitals:   10/07/21 1345  BP: (!) 115/58  Pulse: 81  Resp: 18  Temp: (!) 97.2 F (36.2 C)  SpO2: 100%   Filed Weights   10/07/21 1345  Weight: 177 lb 8 oz (80.5 kg)    LABORATORY DATA:  I have reviewed the data as listed CMP Latest Ref Rng & Units 09/29/2021 09/22/2021 07/16/2021  Glucose 70 - 99 mg/dL 77 111(H) 104(H)  BUN 6 - 20 mg/dL '8 7 9  ' Creatinine 0.44 - 1.00 mg/dL 0.69 0.66 0.78  Sodium 135 - 145 mmol/L 141 141 139  Potassium 3.5 - 5.1 mmol/L 3.9 3.9 3.7  Chloride 98 - 111 mmol/L 105 107 102  CO2 22 - 32 mmol/L '28 27 27  ' Calcium 8.9 - 10.3 mg/dL 9.6 8.7(L) 9.6  Total Protein 6.5 - 8.1 g/dL 7.3 6.9 7.8  Total Bilirubin 0.3 - 1.2 mg/dL 0.3 0.4 0.5  Alkaline Phos 38 - 126 U/L 80 51 55  AST 15 - 41 U/L 15 15 14(L)  ALT 0 - 44 U/L '14 18 14    ' Lab Results  Component Value Date   WBC 9.6 10/07/2021   HGB 12.1 10/07/2021   HCT 34.6 (L) 10/07/2021   MCV 90.6 10/07/2021   PLT 150 10/07/2021   NEUTROABS 6.2 10/07/2021    ASSESSMENT & PLAN:  Malignant neoplasm of upper-outer quadrant of right breast in female, estrogen receptor negative ([place]) 07/08/2021: Palpable right breast lump. Diagnostic mammogram: showed highly suspicious right breast mass and an indeterminate single right axillary lymph node with up to 5 mm diffuse cortical thickening. Biopsy: grade 2-3 invasive ductal carcinoma, DCIS, and right axillary lymph node negative for metastatic carcinoma; ER+(80%)/PR-/Her2-.   On the final pathology her estrogen receptor is negative and therefore patient has triple negative breast cancer   Recommendations: 1. Right mastectomy: Grade 3 IDC 2.8 cm with high-grade DCIS, margins negative, 0/3 lymph nodes negative, ER 0%, PR 0%, HER2 negative,  Ki-67 40% (final pathology is triple negative disease) 2. adjuvant chemotherapy with dose dense Adriamycin and Cytoxan followed by Taxol and carboplatin 3. Genetic testing -------------------------------------------------------------------------------------------------- Current treatment: Cycle 2 dose dense Adriamycin and Cytoxan Chemo Toxicities: Denies any nausea or vomiting. She had fatigue for a few days and then got better. Reviewed her blood counts.   Echocardiogram 09/15/2021: EF 60 to 65%   Return to clinic in 2 weeks for cycle 3   No orders of the defined types were placed in this encounter.  The patient has a good understanding of the overall plan. she agrees with it. she will call with any problems that may develop before the next visit here.  Total time spent: 30 mins including face to face time and time spent for planning, charting and coordination of care  Katelyn Eisenmenger, MD, MPH 10/07/2021  I, Thana Ates, am acting as scribe for Dr. Nicholas Lose.  I have reviewed the above documentation for accuracy and completeness, and I agree with the above.

## 2021-10-07 ENCOUNTER — Inpatient Hospital Stay: Payer: BC Managed Care – PPO

## 2021-10-07 ENCOUNTER — Inpatient Hospital Stay (HOSPITAL_BASED_OUTPATIENT_CLINIC_OR_DEPARTMENT_OTHER): Payer: BC Managed Care – PPO | Admitting: Hematology and Oncology

## 2021-10-07 ENCOUNTER — Other Ambulatory Visit: Payer: Self-pay

## 2021-10-07 DIAGNOSIS — Z79899 Other long term (current) drug therapy: Secondary | ICD-10-CM | POA: Diagnosis not present

## 2021-10-07 DIAGNOSIS — Z9013 Acquired absence of bilateral breasts and nipples: Secondary | ICD-10-CM | POA: Diagnosis not present

## 2021-10-07 DIAGNOSIS — Z5111 Encounter for antineoplastic chemotherapy: Secondary | ICD-10-CM | POA: Diagnosis not present

## 2021-10-07 DIAGNOSIS — C50411 Malignant neoplasm of upper-outer quadrant of right female breast: Secondary | ICD-10-CM | POA: Diagnosis not present

## 2021-10-07 DIAGNOSIS — Z171 Estrogen receptor negative status [ER-]: Secondary | ICD-10-CM | POA: Diagnosis not present

## 2021-10-07 DIAGNOSIS — Z9221 Personal history of antineoplastic chemotherapy: Secondary | ICD-10-CM | POA: Diagnosis not present

## 2021-10-07 DIAGNOSIS — Z95828 Presence of other vascular implants and grafts: Secondary | ICD-10-CM

## 2021-10-07 DIAGNOSIS — Z170 Estrogen receptor positive status [ER+]: Secondary | ICD-10-CM

## 2021-10-07 LAB — CBC WITH DIFFERENTIAL (CANCER CENTER ONLY)
Abs Immature Granulocytes: 0.5 10*3/uL — ABNORMAL HIGH (ref 0.00–0.07)
Basophils Absolute: 0.1 10*3/uL (ref 0.0–0.1)
Basophils Relative: 1 %
Eosinophils Absolute: 0.1 10*3/uL (ref 0.0–0.5)
Eosinophils Relative: 1 %
HCT: 34.6 % — ABNORMAL LOW (ref 36.0–46.0)
Hemoglobin: 12.1 g/dL (ref 12.0–15.0)
Immature Granulocytes: 5 %
Lymphocytes Relative: 19 %
Lymphs Abs: 1.8 10*3/uL (ref 0.7–4.0)
MCH: 31.7 pg (ref 26.0–34.0)
MCHC: 35 g/dL (ref 30.0–36.0)
MCV: 90.6 fL (ref 80.0–100.0)
Monocytes Absolute: 0.8 10*3/uL (ref 0.1–1.0)
Monocytes Relative: 8 %
Neutro Abs: 6.2 10*3/uL (ref 1.7–7.7)
Neutrophils Relative %: 66 %
Platelet Count: 150 10*3/uL (ref 150–400)
RBC: 3.82 MIL/uL — ABNORMAL LOW (ref 3.87–5.11)
RDW: 12.5 % (ref 11.5–15.5)
WBC Count: 9.6 10*3/uL (ref 4.0–10.5)
nRBC: 0 % (ref 0.0–0.2)

## 2021-10-07 LAB — CMP (CANCER CENTER ONLY)
ALT: 16 U/L (ref 0–44)
AST: 14 U/L — ABNORMAL LOW (ref 15–41)
Albumin: 4.1 g/dL (ref 3.5–5.0)
Alkaline Phosphatase: 68 U/L (ref 38–126)
Anion gap: 7 (ref 5–15)
BUN: 7 mg/dL (ref 6–20)
CO2: 27 mmol/L (ref 22–32)
Calcium: 8.6 mg/dL — ABNORMAL LOW (ref 8.9–10.3)
Chloride: 107 mmol/L (ref 98–111)
Creatinine: 0.65 mg/dL (ref 0.44–1.00)
GFR, Estimated: >60 mL/min (ref >60)
Glucose, Bld: 96 mg/dL (ref 70–99)
Potassium: 3.9 mmol/L (ref 3.5–5.1)
Sodium: 141 mmol/L (ref 135–145)
Total Bilirubin: 0.3 mg/dL (ref 0.3–1.2)
Total Protein: 7 g/dL (ref 6.5–8.1)

## 2021-10-07 MED ORDER — SODIUM CHLORIDE 0.9 % IV SOLN
600.0000 mg/m2 | Freq: Once | INTRAVENOUS | Status: AC
  Administered 2021-10-07: 1140 mg via INTRAVENOUS
  Filled 2021-10-07: qty 57

## 2021-10-07 MED ORDER — DOXORUBICIN HCL CHEMO IV INJECTION 2 MG/ML
60.0000 mg/m2 | Freq: Once | INTRAVENOUS | Status: AC
  Administered 2021-10-07: 114 mg via INTRAVENOUS
  Filled 2021-10-07: qty 57

## 2021-10-07 MED ORDER — DEXAMETHASONE SODIUM PHOSPHATE 100 MG/10ML IJ SOLN
10.0000 mg | Freq: Once | INTRAMUSCULAR | Status: AC
  Administered 2021-10-07: 10 mg via INTRAVENOUS
  Filled 2021-10-07: qty 10

## 2021-10-07 MED ORDER — HEPARIN SOD (PORK) LOCK FLUSH 100 UNIT/ML IV SOLN
500.0000 [IU] | Freq: Once | INTRAVENOUS | Status: AC | PRN
  Administered 2021-10-07: 500 [IU]

## 2021-10-07 MED ORDER — SODIUM CHLORIDE 0.9 % IV SOLN: Freq: Once | INTRAVENOUS | Status: AC

## 2021-10-07 MED ORDER — SODIUM CHLORIDE 0.9% FLUSH
10.0000 mL | INTRAVENOUS | Status: DC | PRN
  Administered 2021-10-07: 10 mL via INTRAVENOUS

## 2021-10-07 MED ORDER — SODIUM CHLORIDE 0.9% FLUSH
10.0000 mL | INTRAVENOUS | Status: DC | PRN
  Administered 2021-10-07: 10 mL

## 2021-10-07 MED ORDER — SODIUM CHLORIDE 0.9 % IV SOLN
150.0000 mg | Freq: Once | INTRAVENOUS | Status: AC
  Administered 2021-10-07: 150 mg via INTRAVENOUS
  Filled 2021-10-07: qty 150

## 2021-10-07 MED ORDER — PALONOSETRON HCL INJECTION 0.25 MG/5ML
0.2500 mg | Freq: Once | INTRAVENOUS | Status: AC
  Administered 2021-10-07: 0.25 mg via INTRAVENOUS
  Filled 2021-10-07: qty 5

## 2021-10-07 NOTE — Patient Instructions (Signed)
[facility]  Discharge Instructions: Thank you for choosing [facility] to provide your oncology and hematology care.   If you have a lab appointment with the [facility], please go directly to the [facility] and check in at the registration area.   Wear comfortable clothing and clothing appropriate for easy access to any Portacath or PICC line.   We strive to give you quality time with your provider. You may need to reschedule your appointment if you arrive late (15 or more minutes).  Arriving late affects you and other patients whose appointments are after yours.  Also, if you miss three or more appointments without notifying the office, you may be dismissed from the clinic at the providers discretion.      For prescription refill requests, have your pharmacy contact our office and allow 72 hours for refills to be completed.    Today you received the following chemotherapy and/or immunotherapy agents: Adriamycin/Cytoxan      To help prevent nausea and vomiting after your treatment, we encourage you to take your nausea medication as directed.  BELOW ARE SYMPTOMS THAT SHOULD BE REPORTED IMMEDIATELY: *FEVER GREATER THAN 100.4 F (38 C) OR HIGHER *CHILLS OR SWEATING *NAUSEA AND VOMITING THAT IS NOT CONTROLLED WITH YOUR NAUSEA MEDICATION *UNUSUAL SHORTNESS OF BREATH *UNUSUAL BRUISING OR BLEEDING *URINARY PROBLEMS (pain or burning when urinating, or frequent urination) *BOWEL PROBLEMS (unusual diarrhea, constipation, pain near the anus) TENDERNESS IN MOUTH AND THROAT WITH OR WITHOUT PRESENCE OF ULCERS (sore throat, sores in mouth, or a toothache) UNUSUAL RASH, SWELLING OR PAIN  UNUSUAL VAGINAL DISCHARGE OR ITCHING   Items with * indicate a potential emergency and should be followed up as soon as possible or go to the Emergency Department if any problems should occur.  Please show the CHEMOTHERAPY ALERT CARD or IMMUNOTHERAPY ALERT CARD at  check-in to the Emergency Department and triage nurse.  Should you have questions after your visit or need to cancel or reschedule your appointment, please contact [facility]  Dept: [phone]  and follow the prompts.  Office hours are 8:00 a.m. to 4:30 p.m. Monday - Friday. Please note that voicemails left after 4:00 p.m. may not be returned until the following business day.  We are closed weekends and major holidays. You have access to a nurse at all times for urgent questions. Please call the main number to the clinic Dept: [phone] and follow the prompts.   For any non-urgent questions, you may also contact your provider using MyChart. We now offer e-Visits for anyone 46 and older to request care online for non-urgent symptoms. For details visit [email or web address].   Also download the MyChart app! Go to the app store, search "MyChart", open the app, select [facility], and log in with your MyChart username and password.  Due to Covid, a mask is required upon entering the hospital/clinic. If you do not have a mask, one will be given to you upon arrival. For doctor visits, patients may have 1 support person aged 46 or older with them. For treatment visits, patients cannot have anyone with them due to current Covid guidelines and our immunocompromised population.

## 2021-10-08 DIAGNOSIS — F419 Anxiety disorder, unspecified: Secondary | ICD-10-CM | POA: Diagnosis not present

## 2021-10-08 DIAGNOSIS — C44501 Unspecified malignant neoplasm of skin of breast: Secondary | ICD-10-CM | POA: Diagnosis not present

## 2021-10-08 DIAGNOSIS — G479 Sleep disorder, unspecified: Secondary | ICD-10-CM | POA: Diagnosis not present

## 2021-10-09 ENCOUNTER — Other Ambulatory Visit: Payer: Self-pay

## 2021-10-09 ENCOUNTER — Inpatient Hospital Stay: Payer: BC Managed Care – PPO

## 2021-10-09 ENCOUNTER — Ambulatory Visit: Payer: BC Managed Care – PPO

## 2021-10-09 VITALS — BP 123/67 | HR 73 | Temp 99.2°F | Resp 18

## 2021-10-09 DIAGNOSIS — Z171 Estrogen receptor negative status [ER-]: Secondary | ICD-10-CM | POA: Diagnosis not present

## 2021-10-09 DIAGNOSIS — C50411 Malignant neoplasm of upper-outer quadrant of right female breast: Secondary | ICD-10-CM | POA: Diagnosis not present

## 2021-10-09 DIAGNOSIS — Z5111 Encounter for antineoplastic chemotherapy: Secondary | ICD-10-CM | POA: Diagnosis not present

## 2021-10-09 DIAGNOSIS — Z9221 Personal history of antineoplastic chemotherapy: Secondary | ICD-10-CM | POA: Diagnosis not present

## 2021-10-09 DIAGNOSIS — Z9013 Acquired absence of bilateral breasts and nipples: Secondary | ICD-10-CM | POA: Diagnosis not present

## 2021-10-09 DIAGNOSIS — Z79899 Other long term (current) drug therapy: Secondary | ICD-10-CM | POA: Diagnosis not present

## 2021-10-09 MED ORDER — PEGFILGRASTIM-CBQV 6 MG/0.6ML ~~LOC~~ SOSY
6.0000 mg | PREFILLED_SYRINGE | Freq: Once | SUBCUTANEOUS | Status: AC
  Administered 2021-10-09: 6 mg via SUBCUTANEOUS
  Filled 2021-10-09: qty 0.6

## 2021-10-10 ENCOUNTER — Telehealth: Payer: Self-pay | Admitting: Genetic Counselor

## 2021-10-10 ENCOUNTER — Ambulatory Visit: Payer: Self-pay | Admitting: Genetic Counselor

## 2021-10-10 DIAGNOSIS — Z8042 Family history of malignant neoplasm of prostate: Secondary | ICD-10-CM

## 2021-10-10 DIAGNOSIS — Z808 Family history of malignant neoplasm of other organs or systems: Secondary | ICD-10-CM

## 2021-10-10 DIAGNOSIS — Z803 Family history of malignant neoplasm of breast: Secondary | ICD-10-CM

## 2021-10-10 DIAGNOSIS — Z1379 Encounter for other screening for genetic and chromosomal anomalies: Secondary | ICD-10-CM

## 2021-10-10 DIAGNOSIS — Z8049 Family history of malignant neoplasm of other genital organs: Secondary | ICD-10-CM

## 2021-10-10 DIAGNOSIS — Z171 Estrogen receptor negative status [ER-]: Secondary | ICD-10-CM

## 2021-10-10 NOTE — Telephone Encounter (Signed)
Revealed negative genetic testing.  Discussed that we do not know why she has breast cancer or why there is cancer in the family. It could be familial, due to a different gene that we are not testing, or maybe our current technology may not be able to pick something up.  It will be important for her to keep in contact with genetics to keep up with whether additional testing may be needed.

## 2021-10-10 NOTE — Progress Notes (Signed)
HPI:   Katelyn Hobbs was previously seen in the [place] clinic due to a personal history of cancer and concerns regarding a hereditary predisposition to cancer. Please refer to our prior cancer genetics clinic note for more information regarding our discussion, assessment and recommendations, at the time. Katelyn Hobbs's recent genetic test results were disclosed to her, as were recommendations warranted by these results. These results and recommendations are discussed in more detail below.  CANCER HISTORY:  Oncology History  Malignant neoplasm of upper-outer quadrant of right breast in female, estrogen receptor negative ([place])  07/08/2021 Initial Diagnosis   Palpable right breast lump. Diagnostic mammogram: showed highly suspicious right breast mass and an indeterminate single right axillary lymph node with up to 5 mm diffuse cortical thickening. Biopsy: grade 2-3 invasive ductal carcinoma, DCIS, and right axillary lymph node negative for metastatic carcinoma; ER+(80%)/PR-/Her2-.   07/16/2021 Cancer Staging   Staging form: Breast, AJCC 8th Edition - Clinical stage from 07/16/2021: Stage IIB (cT2, cN0, cM0, G3, ER+, PR-, HER2-) - Signed by Nicholas Lose, MD on 07/16/2021 Stage prefix: Initial diagnosis Histologic grading system: 3 grade system    08/18/2021 Surgery   Right mastectomy: Grade 3 IDC 2.8 cm with high-grade DCIS, margins negative, 0/3 lymph nodes negative, ER 0%, PR 0%, HER2 negative, Ki-67 60% (triple negative)   09/22/2021 -  Chemotherapy   Patient is on Treatment Plan : BREAST Dose Dense AC q14d / CARBOplatin D1 + PACLitaxel D1,8,15 q21d     10/06/2021 Genetic Testing   Negative hereditary cancer genetic testing: no pathogenic variants detected in [facility] CancerNext-Expanded +RNAinsight Panel.  The report date is 10/06/2021.   The CancerNext-Expanded gene panel offered by [facility] and includes sequencing, rearrangement, and RNA analysis for the following 77  genes: AIP, ALK, APC, ATM, AXIN2, BAP1, BARD1, BLM, BMPR1A, BRCA1, BRCA2, BRIP1, CDC73, CDH1, CDK4, CDKN1B, CDKN2A, CHEK2, CTNNA1, DICER1, FANCC, FH, FLCN, GALNT12, KIF1B, LZTR1, MAX, MEN1, MET, MLH1, MSH2, MSH3, MSH6, MUTYH, NBN, NF1, NF2, NTHL1, PALB2, PHOX2B, PMS2, POT1, PRKAR1A, PTCH1, PTEN, RAD51C, RAD51D, RB1, RECQL, RET, SDHA, SDHAF2, SDHB, SDHC, SDHD, SMAD4, SMARCA4, SMARCB1, SMARCE1, STK11, SUFU, TMEM127, TP53, TSC1, TSC2, VHL and XRCC2 (sequencing and deletion/duplication); EGFR, EGLN1, HOXB13, KIT, MITF, PDGFRA, POLD1, and POLE (sequencing only); EPCAM and GREM1 (deletion/duplication only).     FAMILY HISTORY:  We obtained a detailed, 4-generation family history.  Significant diagnoses are listed below:      Family History  Problem Relation Age of Onset   Uterine cancer Mother 80   Melanoma Sister 42        T1a; on back   Prostate cancer Paternal Grandfather          dx after 42   Breast cancer Other 39        MGM's mother        Ms. Spurling is unaware of previous family history of genetic testing for hereditary cancer risks. There is no reported Ashkenazi Jewish ancestry. There is no known consanguinity.    GENETIC TEST RESULTS:  The [facility] CancerNext-Expanded +RNAinsight Panel found no pathogenic mutations. The CancerNext-Expanded gene panel offered by [facility] and includes sequencing, rearrangement, and RNA analysis for the following 77 genes: AIP, ALK, APC, ATM, AXIN2, BAP1, BARD1, BLM, BMPR1A, BRCA1, BRCA2, BRIP1, CDC73, CDH1, CDK4, CDKN1B, CDKN2A, CHEK2, CTNNA1, DICER1, FANCC, FH, FLCN, GALNT12, KIF1B, LZTR1, MAX, MEN1, MET, MLH1, MSH2, MSH3, MSH6, MUTYH, NBN, NF1, NF2, NTHL1, PALB2, PHOX2B, PMS2, POT1, PRKAR1A, PTCH1, PTEN, RAD51C, RAD51D, RB1, RECQL, RET,  SDHA, SDHAF2, SDHB, SDHC, SDHD, SMAD4, SMARCA4, SMARCB1, SMARCE1, STK11, SUFU, TMEM127, TP53, TSC1, TSC2, VHL and XRCC2 (sequencing and deletion/duplication); EGFR, EGLN1, HOXB13, KIT, MITF, PDGFRA, POLD1, and POLE  (sequencing only); EPCAM and GREM1 (deletion/duplication only).   The test report has been scanned into EPIC and is located under the Molecular Pathology section of the Results Review tab.  A portion of the result report is included below for reference. Genetic testing reported out on 10/06/2021.       Even though a pathogenic variant was not identified, possible explanations for the cancer in the family may include: There may be no hereditary risk for cancer in the family. The cancers in Katelyn Hobbs and/or her family may be sporadic/familial or due to other genetic and environmental factors. There may be a gene mutation in one of these genes that current testing methods cannot detect but that chance is small. There could be another gene that has not yet been discovered, or that we have not yet tested, that is responsible for the cancer diagnoses in the family.  It is also possible there is a hereditary cause for the cancer in the family that Katelyn Hobbs did not inherit.   Therefore, it is important to remain in touch with cancer genetics in the future so that we can continue to offer Katelyn Hobbs the most up to date genetic testing.   ADDITIONAL GENETIC TESTING:  We discussed with Katelyn Hobbs that her genetic testing was fairly extensive.  If there are genes identified to increase cancer risk that can be analyzed in the future, we would be happy to discuss and coordinate this testing at that time.    CANCER SCREENING RECOMMENDATIONS:  Katelyn Hobbs's test result is considered negative (normal).  This means that we have not identified a hereditary cause for her personal history of triple negative breast cancer at this time.   An individual's cancer risk and medical management are not determined by genetic test results alone. Overall cancer risk assessment incorporates additional factors, including personal medical history, family history, and any available genetic information that may result  in a personalized plan for cancer prevention and surveillance. Therefore, it is recommended she continue to follow the cancer management and screening guidelines provided by her oncology and primary healthcare provider.  RECOMMENDATIONS FOR FAMILY MEMBERS:   Individuals in this family might be at some increased risk of developing cancer, over the general population risk, due to the family history of cancer.  Individuals in the family should notify their providers of the family history of cancer. We recommend women in this family have a yearly mammogram beginning at age 62, or 41 years younger than the earliest onset of cancer, an annual clinical breast exam, and perform monthly breast self-exams.  Family members should consider skin exams by dermatology given the family history of skin cancer.    FOLLOW-UP:  Lastly, we discussed with Katelyn Hobbs that cancer genetics is a rapidly advancing field and it is possible that new genetic tests will be appropriate for her and/or her family members in the future. We encouraged her to remain in contact with cancer genetics on an annual basis so we can update her personal and family histories and let her know of advances in cancer genetics that may benefit this family.   Our contact number was provided. Katelyn Hobbs's questions were answered to her satisfaction, and she knows she is welcome to call us at anytime with additional questions or concerns.   Teyton Pattillo  Aurora Mask, MS, [facility] Genetic Counselor [email or web address] (P) [phone]

## 2021-10-14 ENCOUNTER — Telehealth: Payer: Self-pay | Admitting: *Deleted

## 2021-10-14 NOTE — Telephone Encounter (Signed)
Spoke with patient regarding her appts.  She was scheduled for cycle 3 A/C on 12/23 which would be to soon from her last chemo on 12/13. Patient is going out of town 12/29. Was able to get chemo appt moved to 12/27 with injection 12/28. Patient is aware of these appts and times.

## 2021-10-17 ENCOUNTER — Ambulatory Visit: Payer: BC Managed Care – PPO

## 2021-10-17 ENCOUNTER — Inpatient Hospital Stay: Payer: BC Managed Care – PPO

## 2021-10-17 ENCOUNTER — Other Ambulatory Visit: Payer: BC Managed Care – PPO

## 2021-10-18 ENCOUNTER — Ambulatory Visit: Payer: BC Managed Care – PPO

## 2021-10-21 ENCOUNTER — Inpatient Hospital Stay: Payer: BC Managed Care – PPO

## 2021-10-21 ENCOUNTER — Other Ambulatory Visit: Payer: Self-pay

## 2021-10-21 VITALS — BP 115/55 | HR 76 | Temp 98.4°F | Resp 18 | Wt 179.5 lb

## 2021-10-21 DIAGNOSIS — C50411 Malignant neoplasm of upper-outer quadrant of right female breast: Secondary | ICD-10-CM

## 2021-10-21 DIAGNOSIS — Z171 Estrogen receptor negative status [ER-]: Secondary | ICD-10-CM

## 2021-10-21 DIAGNOSIS — Z95828 Presence of other vascular implants and grafts: Secondary | ICD-10-CM | POA: Insufficient documentation

## 2021-10-21 DIAGNOSIS — Z9013 Acquired absence of bilateral breasts and nipples: Secondary | ICD-10-CM | POA: Diagnosis not present

## 2021-10-21 DIAGNOSIS — Z79899 Other long term (current) drug therapy: Secondary | ICD-10-CM | POA: Diagnosis not present

## 2021-10-21 DIAGNOSIS — Z9221 Personal history of antineoplastic chemotherapy: Secondary | ICD-10-CM | POA: Diagnosis not present

## 2021-10-21 DIAGNOSIS — Z5111 Encounter for antineoplastic chemotherapy: Secondary | ICD-10-CM | POA: Diagnosis not present

## 2021-10-21 LAB — CMP (CANCER CENTER ONLY)
ALT: 13 U/L (ref 0–44)
AST: 12 U/L — ABNORMAL LOW (ref 15–41)
Albumin: 4.5 g/dL (ref 3.5–5.0)
Alkaline Phosphatase: 78 U/L (ref 38–126)
Anion gap: 6 (ref 5–15)
BUN: 8 mg/dL (ref 6–20)
CO2: 29 mmol/L (ref 22–32)
Calcium: 9.1 mg/dL (ref 8.9–10.3)
Chloride: 101 mmol/L (ref 98–111)
Creatinine: 0.61 mg/dL (ref 0.44–1.00)
GFR, Estimated: >60 mL/min (ref >60)
Glucose, Bld: 95 mg/dL (ref 70–99)
Potassium: 3.8 mmol/L (ref 3.5–5.1)
Sodium: 136 mmol/L (ref 135–145)
Total Bilirubin: 0.3 mg/dL (ref 0.3–1.2)
Total Protein: 7.3 g/dL (ref 6.5–8.1)

## 2021-10-21 LAB — CBC WITH DIFFERENTIAL (CANCER CENTER ONLY)
Abs Immature Granulocytes: 1.89 10*3/uL — ABNORMAL HIGH (ref 0.00–0.07)
Basophils Absolute: 0.2 10*3/uL — ABNORMAL HIGH (ref 0.0–0.1)
Basophils Relative: 1 %
Eosinophils Absolute: 0.2 10*3/uL (ref 0.0–0.5)
Eosinophils Relative: 1 %
HCT: 34.5 % — ABNORMAL LOW (ref 36.0–46.0)
Hemoglobin: 11.7 g/dL — ABNORMAL LOW (ref 12.0–15.0)
Immature Granulocytes: 11 %
Lymphocytes Relative: 12 %
Lymphs Abs: 2.1 10*3/uL (ref 0.7–4.0)
MCH: 31.2 pg (ref 26.0–34.0)
MCHC: 33.9 g/dL (ref 30.0–36.0)
MCV: 92 fL (ref 80.0–100.0)
Monocytes Absolute: 1.3 10*3/uL — ABNORMAL HIGH (ref 0.1–1.0)
Monocytes Relative: 7 %
Neutro Abs: 11.9 10*3/uL — ABNORMAL HIGH (ref 1.7–7.7)
Neutrophils Relative %: 68 %
Platelet Count: 137 10*3/uL — ABNORMAL LOW (ref 150–400)
RBC: 3.75 MIL/uL — ABNORMAL LOW (ref 3.87–5.11)
RDW: 12.9 % (ref 11.5–15.5)
WBC Count: 17.5 10*3/uL — ABNORMAL HIGH (ref 4.0–10.5)
nRBC: 0.1 % (ref 0.0–0.2)

## 2021-10-21 MED ORDER — SODIUM CHLORIDE 0.9 % IV SOLN: Freq: Once | INTRAVENOUS | Status: AC

## 2021-10-21 MED ORDER — SODIUM CHLORIDE 0.9 % IV SOLN
150.0000 mg | Freq: Once | INTRAVENOUS | Status: AC
  Administered 2021-10-21: 09:00:00 150 mg via INTRAVENOUS
  Filled 2021-10-21: qty 150

## 2021-10-21 MED ORDER — DOXORUBICIN HCL CHEMO IV INJECTION 2 MG/ML
60.0000 mg/m2 | Freq: Once | INTRAVENOUS | Status: AC
  Administered 2021-10-21: 10:00:00 114 mg via INTRAVENOUS
  Filled 2021-10-21: qty 57

## 2021-10-21 MED ORDER — SODIUM CHLORIDE 0.9% FLUSH
10.0000 mL | Freq: Once | INTRAVENOUS | Status: AC
  Administered 2021-10-21: 08:00:00 10 mL

## 2021-10-21 MED ORDER — SODIUM CHLORIDE 0.9 % IV SOLN
600.0000 mg/m2 | Freq: Once | INTRAVENOUS | Status: AC
  Administered 2021-10-21: 10:00:00 1140 mg via INTRAVENOUS
  Filled 2021-10-21: qty 57

## 2021-10-21 MED ORDER — HEPARIN SOD (PORK) LOCK FLUSH 100 UNIT/ML IV SOLN
500.0000 [IU] | Freq: Once | INTRAVENOUS | Status: AC | PRN
  Administered 2021-10-21: 11:00:00 500 [IU]

## 2021-10-21 MED ORDER — SODIUM CHLORIDE 0.9 % IV SOLN
10.0000 mg | Freq: Once | INTRAVENOUS | Status: AC
  Administered 2021-10-21: 09:00:00 10 mg via INTRAVENOUS
  Filled 2021-10-21: qty 10

## 2021-10-21 MED ORDER — PALONOSETRON HCL INJECTION 0.25 MG/5ML
0.2500 mg | Freq: Once | INTRAVENOUS | Status: AC
  Administered 2021-10-21: 09:00:00 0.25 mg via INTRAVENOUS
  Filled 2021-10-21: qty 5

## 2021-10-21 MED ORDER — SODIUM CHLORIDE 0.9% FLUSH
10.0000 mL | INTRAVENOUS | Status: DC | PRN
  Administered 2021-10-21: 11:00:00 10 mL

## 2021-10-21 NOTE — Patient Instructions (Signed)
[facility] ONCOLOGY  Discharge Instructions: Thank you for choosing [facility] to provide your oncology and hematology care.   If you have a lab appointment with the [facility], please go directly to the [facility] and check in at the registration area.   Wear comfortable clothing and clothing appropriate for easy access to any Portacath or PICC line.   We strive to give you quality time with your provider. You may need to reschedule your appointment if you arrive late (15 or more minutes).  Arriving late affects you and other patients whose appointments are after yours.  Also, if you miss three or more appointments without notifying the office, you may be dismissed from the clinic at the providers discretion.      For prescription refill requests, have your pharmacy contact our office and allow 72 hours for refills to be completed.    Today you received the following chemotherapy and/or immunotherapy agents: doxorubicin and cytoxan      To help prevent nausea and vomiting after your treatment, we encourage you to take your nausea medication as directed.  BELOW ARE SYMPTOMS THAT SHOULD BE REPORTED IMMEDIATELY: *FEVER GREATER THAN 100.4 F (38 C) OR HIGHER *CHILLS OR SWEATING *NAUSEA AND VOMITING THAT IS NOT CONTROLLED WITH YOUR NAUSEA MEDICATION *UNUSUAL SHORTNESS OF BREATH *UNUSUAL BRUISING OR BLEEDING *URINARY PROBLEMS (pain or burning when urinating, or frequent urination) *BOWEL PROBLEMS (unusual diarrhea, constipation, pain near the anus) TENDERNESS IN MOUTH AND THROAT WITH OR WITHOUT PRESENCE OF ULCERS (sore throat, sores in mouth, or a toothache) UNUSUAL RASH, SWELLING OR PAIN  UNUSUAL VAGINAL DISCHARGE OR ITCHING   Items with * indicate a potential emergency and should be followed up as soon as possible or go to the Emergency Department if any problems should occur.  Please show the CHEMOTHERAPY ALERT CARD or IMMUNOTHERAPY ALERT CARD  at check-in to the Emergency Department and triage nurse.  Should you have questions after your visit or need to cancel or reschedule your appointment, please contact [facility]  Dept: [phone]  and follow the prompts.  Office hours are 8:00 a.m. to 4:30 p.m. Monday - Friday. Please note that voicemails left after 4:00 p.m. may not be returned until the following business day.  We are closed weekends and major holidays. You have access to a nurse at all times for urgent questions. Please call the main number to the clinic Dept: [phone] and follow the prompts.   For any non-urgent questions, you may also contact your provider using MyChart. We now offer e-Visits for anyone 33 and older to request care online for non-urgent symptoms. For details visit [email or web address].   Also download the MyChart app! Go to the app store, search "MyChart", open the app, select [facility], and log in with your MyChart username and password.  Due to Covid, a mask is required upon entering the hospital/clinic. If you do not have a mask, one will be given to you upon arrival. For doctor visits, patients may have 1 support person aged 20 or older with them. For treatment visits, patients cannot have anyone with them due to current Covid guidelines and our immunocompromised population.

## 2021-10-22 ENCOUNTER — Ambulatory Visit: Payer: BC Managed Care – PPO

## 2021-10-22 ENCOUNTER — Inpatient Hospital Stay: Payer: BC Managed Care – PPO

## 2021-10-22 ENCOUNTER — Other Ambulatory Visit: Payer: BC Managed Care – PPO

## 2021-10-22 ENCOUNTER — Other Ambulatory Visit: Payer: Self-pay | Admitting: Internal Medicine

## 2021-10-22 VITALS — BP 109/58 | Temp 97.5°F | Resp 18

## 2021-10-22 DIAGNOSIS — Z171 Estrogen receptor negative status [ER-]: Secondary | ICD-10-CM | POA: Diagnosis not present

## 2021-10-22 DIAGNOSIS — Z79899 Other long term (current) drug therapy: Secondary | ICD-10-CM | POA: Diagnosis not present

## 2021-10-22 DIAGNOSIS — Z5111 Encounter for antineoplastic chemotherapy: Secondary | ICD-10-CM | POA: Diagnosis not present

## 2021-10-22 DIAGNOSIS — Z9013 Acquired absence of bilateral breasts and nipples: Secondary | ICD-10-CM | POA: Diagnosis not present

## 2021-10-22 DIAGNOSIS — C50411 Malignant neoplasm of upper-outer quadrant of right female breast: Secondary | ICD-10-CM | POA: Diagnosis not present

## 2021-10-22 DIAGNOSIS — Z9221 Personal history of antineoplastic chemotherapy: Secondary | ICD-10-CM | POA: Diagnosis not present

## 2021-10-22 MED ORDER — PEGFILGRASTIM-CBQV 6 MG/0.6ML ~~LOC~~ SOSY
6.0000 mg | PREFILLED_SYRINGE | Freq: Once | SUBCUTANEOUS | Status: AC
  Administered 2021-10-22: 14:00:00 6 mg via SUBCUTANEOUS
  Filled 2021-10-22: qty 0.6

## 2021-10-23 ENCOUNTER — Ambulatory Visit: Payer: BC Managed Care – PPO

## 2021-10-23 DIAGNOSIS — C50911 Malignant neoplasm of unspecified site of right female breast: Secondary | ICD-10-CM | POA: Diagnosis not present

## 2021-10-23 DIAGNOSIS — Z9012 Acquired absence of left breast and nipple: Secondary | ICD-10-CM | POA: Diagnosis not present

## 2021-10-28 DIAGNOSIS — M79672 Pain in left foot: Secondary | ICD-10-CM | POA: Diagnosis not present

## 2021-10-28 DIAGNOSIS — S93601A Unspecified sprain of right foot, initial encounter: Secondary | ICD-10-CM | POA: Diagnosis not present

## 2021-10-28 DIAGNOSIS — M79671 Pain in right foot: Secondary | ICD-10-CM | POA: Diagnosis not present

## 2021-10-28 DIAGNOSIS — S93602A Unspecified sprain of left foot, initial encounter: Secondary | ICD-10-CM | POA: Diagnosis not present

## 2021-10-30 ENCOUNTER — Telehealth: Payer: Self-pay | Admitting: *Deleted

## 2021-10-30 DIAGNOSIS — S93601A Unspecified sprain of right foot, initial encounter: Secondary | ICD-10-CM | POA: Diagnosis not present

## 2021-10-30 DIAGNOSIS — M79672 Pain in left foot: Secondary | ICD-10-CM | POA: Diagnosis not present

## 2021-10-30 DIAGNOSIS — S93602A Unspecified sprain of left foot, initial encounter: Secondary | ICD-10-CM | POA: Diagnosis not present

## 2021-10-30 DIAGNOSIS — M79671 Pain in right foot: Secondary | ICD-10-CM | POA: Diagnosis not present

## 2021-10-30 NOTE — Telephone Encounter (Signed)
Received call from patient stating that she missed a step at her house and fell.  She has seen ortho and had xrays. No fractures. She called to ask if it was ok to take Tylenol for some pain. Inform her that it was ok for her take Tylenol as well as she can also take Aleve. Patient verbalized understanding.

## 2021-11-03 DIAGNOSIS — S93602A Unspecified sprain of left foot, initial encounter: Secondary | ICD-10-CM | POA: Diagnosis not present

## 2021-11-03 DIAGNOSIS — S93601A Unspecified sprain of right foot, initial encounter: Secondary | ICD-10-CM | POA: Diagnosis not present

## 2021-11-03 MED FILL — Fosaprepitant Dimeglumine For IV Infusion 150 MG (Base Eq): INTRAVENOUS | Qty: 5 | Status: AC

## 2021-11-03 MED FILL — Dexamethasone Sodium Phosphate Inj 100 MG/10ML: INTRAMUSCULAR | Qty: 1 | Status: AC

## 2021-11-03 NOTE — Assessment & Plan Note (Signed)
07/08/2021:Palpable right breast lump. Diagnostic mammogram: showed highly suspicious right breast mass and an indeterminate single right axillary lymph node with up to 5 mm diffuse cortical thickening. Biopsy: grade 2-3 invasive ductal carcinoma, DCIS, and right axillary lymph node negative for metastatic carcinoma; ER+(80%)/PR-/Her2-.  On the final pathology her estrogen receptor is negative and therefore patient has triple negative breast cancer  Recommendations: 1.Right mastectomy: Grade 3 IDC 2.8 cm with high-grade DCIS, margins negative, 0/3 lymph nodes negative, ER 0%, PR 0%, HER2 negative, Ki-[id number]%(final pathology is triple negative disease) 2.adjuvant chemotherapy with dose dense Adriamycin and Cytoxan followed by Taxol and carboplatin 3. Genetic testing -------------------------------------------------------------------------------------------------- Current treatment: Cycle 3 dose dense Adriamycin and Cytoxan Chemo Toxicities: Denies any nausea or vomiting. She had fatigue for a few days and then got better. Reviewed her blood counts.  Echocardiogram 09/15/2021: EF 60 to 65%  Return to clinic in 2 weeks forcycle 4

## 2021-11-04 ENCOUNTER — Encounter: Payer: Self-pay | Admitting: *Deleted

## 2021-11-04 ENCOUNTER — Inpatient Hospital Stay: Payer: BC Managed Care – PPO | Attending: Hematology and Oncology

## 2021-11-04 ENCOUNTER — Inpatient Hospital Stay (HOSPITAL_BASED_OUTPATIENT_CLINIC_OR_DEPARTMENT_OTHER): Payer: BC Managed Care – PPO | Admitting: Hematology and Oncology

## 2021-11-04 ENCOUNTER — Other Ambulatory Visit: Payer: Self-pay

## 2021-11-04 ENCOUNTER — Inpatient Hospital Stay: Payer: BC Managed Care – PPO

## 2021-11-04 DIAGNOSIS — Z171 Estrogen receptor negative status [ER-]: Secondary | ICD-10-CM

## 2021-11-04 DIAGNOSIS — T451X5A Adverse effect of antineoplastic and immunosuppressive drugs, initial encounter: Secondary | ICD-10-CM | POA: Insufficient documentation

## 2021-11-04 DIAGNOSIS — Z79899 Other long term (current) drug therapy: Secondary | ICD-10-CM | POA: Diagnosis not present

## 2021-11-04 DIAGNOSIS — Z5189 Encounter for other specified aftercare: Secondary | ICD-10-CM | POA: Insufficient documentation

## 2021-11-04 DIAGNOSIS — D6481 Anemia due to antineoplastic chemotherapy: Secondary | ICD-10-CM | POA: Insufficient documentation

## 2021-11-04 DIAGNOSIS — Z95828 Presence of other vascular implants and grafts: Secondary | ICD-10-CM

## 2021-11-04 DIAGNOSIS — C50411 Malignant neoplasm of upper-outer quadrant of right female breast: Secondary | ICD-10-CM | POA: Diagnosis not present

## 2021-11-04 DIAGNOSIS — Z5111 Encounter for antineoplastic chemotherapy: Secondary | ICD-10-CM | POA: Diagnosis not present

## 2021-11-04 DIAGNOSIS — Z9011 Acquired absence of right breast and nipple: Secondary | ICD-10-CM | POA: Diagnosis not present

## 2021-11-04 DIAGNOSIS — Z170 Estrogen receptor positive status [ER+]: Secondary | ICD-10-CM

## 2021-11-04 LAB — CMP (CANCER CENTER ONLY)
ALT: 15 U/L (ref 0–44)
AST: 16 U/L (ref 15–41)
Albumin: 4.4 g/dL (ref 3.5–5.0)
Alkaline Phosphatase: 75 U/L (ref 38–126)
Anion gap: 7 (ref 5–15)
BUN: 10 mg/dL (ref 6–20)
CO2: 29 mmol/L (ref 22–32)
Calcium: 9.3 mg/dL (ref 8.9–10.3)
Chloride: 103 mmol/L (ref 98–111)
Creatinine: 0.61 mg/dL (ref 0.44–1.00)
GFR, Estimated: >60 mL/min (ref >60)
Glucose, Bld: 84 mg/dL (ref 70–99)
Potassium: 4 mmol/L (ref 3.5–5.1)
Sodium: 139 mmol/L (ref 135–145)
Total Bilirubin: 0.3 mg/dL (ref 0.3–1.2)
Total Protein: 7.4 g/dL (ref 6.5–8.1)

## 2021-11-04 LAB — CBC WITH DIFFERENTIAL (CANCER CENTER ONLY)
Abs Immature Granulocytes: 2.74 10*3/uL — ABNORMAL HIGH (ref 0.00–0.07)
Basophils Absolute: 0.2 10*3/uL — ABNORMAL HIGH (ref 0.0–0.1)
Basophils Relative: 1 %
Eosinophils Absolute: 0.1 10*3/uL (ref 0.0–0.5)
Eosinophils Relative: 1 %
HCT: 32 % — ABNORMAL LOW (ref 36.0–46.0)
Hemoglobin: 10.8 g/dL — ABNORMAL LOW (ref 12.0–15.0)
Immature Granulocytes: 14 %
Lymphocytes Relative: 9 %
Lymphs Abs: 1.9 10*3/uL (ref 0.7–4.0)
MCH: 31.8 pg (ref 26.0–34.0)
MCHC: 33.8 g/dL (ref 30.0–36.0)
MCV: 94.1 fL (ref 80.0–100.0)
Monocytes Absolute: 1.2 10*3/uL — ABNORMAL HIGH (ref 0.1–1.0)
Monocytes Relative: 6 %
Neutro Abs: 13.8 10*3/uL — ABNORMAL HIGH (ref 1.7–7.7)
Neutrophils Relative %: 69 %
Platelet Count: 200 10*3/uL (ref 150–400)
RBC: 3.4 MIL/uL — ABNORMAL LOW (ref 3.87–5.11)
RDW: 14.3 % (ref 11.5–15.5)
Smear Review: NORMAL
WBC Count: 19.9 10*3/uL — ABNORMAL HIGH (ref 4.0–10.5)
nRBC: 0.3 % — ABNORMAL HIGH (ref 0.0–0.2)

## 2021-11-04 MED ORDER — HEPARIN SOD (PORK) LOCK FLUSH 100 UNIT/ML IV SOLN: 500.0000 [IU] | Freq: Once | INTRAVENOUS | Status: DC | PRN

## 2021-11-04 MED ORDER — SODIUM CHLORIDE 0.9 % IV SOLN
600.0000 mg/m2 | Freq: Once | INTRAVENOUS | Status: AC
  Administered 2021-11-04: 1140 mg via INTRAVENOUS
  Filled 2021-11-04: qty 57

## 2021-11-04 MED ORDER — SODIUM CHLORIDE 0.9 % IV SOLN
150.0000 mg | Freq: Once | INTRAVENOUS | Status: AC
  Administered 2021-11-04: 150 mg via INTRAVENOUS
  Filled 2021-11-04: qty 150

## 2021-11-04 MED ORDER — PALONOSETRON HCL INJECTION 0.25 MG/5ML
0.2500 mg | Freq: Once | INTRAVENOUS | Status: AC
  Administered 2021-11-04: 0.25 mg via INTRAVENOUS
  Filled 2021-11-04: qty 5

## 2021-11-04 MED ORDER — DOXORUBICIN HCL CHEMO IV INJECTION 2 MG/ML
60.0000 mg/m2 | Freq: Once | INTRAVENOUS | Status: AC
  Administered 2021-11-04: 114 mg via INTRAVENOUS
  Filled 2021-11-04: qty 57

## 2021-11-04 MED ORDER — SODIUM CHLORIDE 0.9% FLUSH: 10.0000 mL | INTRAVENOUS | Status: DC | PRN

## 2021-11-04 MED ORDER — SODIUM CHLORIDE 0.9% FLUSH
10.0000 mL | Freq: Once | INTRAVENOUS | Status: AC
  Administered 2021-11-04: 10 mL

## 2021-11-04 MED ORDER — SODIUM CHLORIDE 0.9 % IV SOLN
10.0000 mg | Freq: Once | INTRAVENOUS | Status: AC
  Administered 2021-11-04: 10 mg via INTRAVENOUS
  Filled 2021-11-04: qty 10

## 2021-11-04 MED ORDER — SODIUM CHLORIDE 0.9 % IV SOLN: Freq: Once | INTRAVENOUS | Status: AC

## 2021-11-04 NOTE — Progress Notes (Signed)
Patient Care Team: Crecencio Mc, MD as PCP - General (Internal Medicine) Mauro Kaufmann, RN as Oncology Nurse Navigator Rockwell Germany, RN as Oncology Nurse Navigator Jovita Kussmaul, MD as Consulting Physician (General Surgery) Nicholas Lose, MD as Consulting Physician (Hematology and Oncology) Eppie Gibson, MD as Attending Physician (Radiation Oncology)  DIAGNOSIS:  Encounter Diagnosis  Name Primary?   Malignant neoplasm of upper-outer quadrant of right breast in female, estrogen receptor negative ([place])     SUMMARY OF ONCOLOGIC HISTORY: Oncology History  Malignant neoplasm of upper-outer quadrant of right breast in female, estrogen receptor negative ([place])  07/08/2021 Initial Diagnosis   Palpable right breast lump. Diagnostic mammogram: showed highly suspicious right breast mass and an indeterminate single right axillary lymph node with up to 5 mm diffuse cortical thickening. Biopsy: grade 2-3 invasive ductal carcinoma, DCIS, and right axillary lymph node negative for metastatic carcinoma; ER+(80%)/PR-/Her2-.   07/16/2021 Cancer Staging   Staging form: Breast, AJCC 8th Edition - Clinical stage from 07/16/2021: Stage IIB (cT2, cN0, cM0, G3, ER+, PR-, HER2-) - Signed by Nicholas Lose, MD on 07/16/2021 Stage prefix: Initial diagnosis Histologic grading system: 3 grade system    08/18/2021 Surgery   Right mastectomy: Grade 3 IDC 2.8 cm with high-grade DCIS, margins negative, 0/3 lymph nodes negative, ER 0%, PR 0%, HER2 negative, Ki-67 60% (triple negative)   09/22/2021 -  Chemotherapy   Patient is on Treatment Plan : BREAST Dose Dense AC q14d / CARBOplatin D1 + PACLitaxel D1,8,15 q21d     10/06/2021 Genetic Testing   Negative hereditary cancer genetic testing: no pathogenic variants detected in [facility] CancerNext-Expanded +RNAinsight Panel.  The report date is 10/06/2021.   The CancerNext-Expanded gene panel offered by [facility] and includes sequencing, rearrangement, and  RNA analysis for the following 77 genes: AIP, ALK, APC, ATM, AXIN2, BAP1, BARD1, BLM, BMPR1A, BRCA1, BRCA2, BRIP1, CDC73, CDH1, CDK4, CDKN1B, CDKN2A, CHEK2, CTNNA1, DICER1, FANCC, FH, FLCN, GALNT12, KIF1B, LZTR1, MAX, MEN1, MET, MLH1, MSH2, MSH3, MSH6, MUTYH, NBN, NF1, NF2, NTHL1, PALB2, PHOX2B, PMS2, POT1, PRKAR1A, PTCH1, PTEN, RAD51C, RAD51D, RB1, RECQL, RET, SDHA, SDHAF2, SDHB, SDHC, SDHD, SMAD4, SMARCA4, SMARCB1, SMARCE1, STK11, SUFU, TMEM127, TP53, TSC1, TSC2, VHL and XRCC2 (sequencing and deletion/duplication); EGFR, EGLN1, HOXB13, KIT, MITF, PDGFRA, POLD1, and POLE (sequencing only); EPCAM and GREM1 (deletion/duplication only).      CHIEF COMPLIANT: Cycle 4 Adriamycin and Cytoxan  INTERVAL HISTORY: Katelyn Hobbs is a 46 year old above-mentioned history of triple negative breast cancer who is currently on adjuvant treatment and today is cycle 4 of Adriamycin and Cytoxan.  Overall she tolerated cycle 3 fairly well.  She did develop redness below the breast for which she took doxycycline and it did resolve the infection/cellulitis/redness in couple of days.  Other than that she has no other major troubles.  She has plenty of energy.  Denies any nausea or vomiting.   ALLERGIES:  is allergic to benadryl [diphenhydramine], latex, and phenergan [promethazine hcl].  MEDICATIONS:  Current Outpatient Medications  Medication Sig Dispense Refill   acetaminophen (TYLENOL) 500 MG tablet Take 1,000 mg by mouth every 8 (eight) hours as needed for moderate pain.     ALPRAZolam (XANAX XR) 2 MG 24 hr tablet TAKE 1 TABLET BY MOUTH EVERYDAY AT BEDTIME 30 tablet 2   cetirizine (ZYRTEC) 10 MG tablet Take 10 mg by mouth at bedtime.     citalopram (CELEXA) 40 MG tablet Take 1 tablet (40 mg total) by mouth daily. 90 tablet 1  ergocalciferol (DRISDOL) 1.25 MG (50000 UT) capsule Take 1 capsule (50,000 Units total) by mouth once a week. 12 capsule 0   ibuprofen (ADVIL) 200 MG tablet Take 800 mg by mouth every 8  (eight) hours as needed for moderate pain.     lidocaine-prilocaine (EMLA) cream Apply to affected area once 30 g 3   LORazepam (ATIVAN) 0.5 MG tablet Take 1 tablet (0.5 mg total) by mouth at bedtime as needed for sleep. 30 tablet 0   ondansetron (ZOFRAN) 8 MG tablet Take 1 tablet (8 mg total) by mouth 2 (two) times daily as needed. Start on the third day after chemotherapy. 30 tablet 1   prochlorperazine (COMPAZINE) 10 MG tablet Take 1 tablet (10 mg total) by mouth every 6 (six) hours as needed (Nausea or vomiting). 30 tablet 1   No current facility-administered medications for this visit.    PHYSICAL EXAMINATION: ECOG PERFORMANCE STATUS: 1 - Symptomatic but completely ambulatory  Vitals:   11/04/21 0943  BP: (!) 106/55  Pulse: 87  Resp: 18  Temp: (!) 97.3 F (36.3 C)  SpO2: 98%   Filed Weights   11/04/21 0943  Weight: 179 lb 14.4 oz (81.6 kg)      LABORATORY DATA:  I have reviewed the data as listed CMP Latest Ref Rng & Units 10/21/2021 10/07/2021 09/29/2021  Glucose 70 - 99 mg/dL 95 96 77  BUN 6 - 20 mg/dL '8 7 8  ' Creatinine 0.44 - 1.00 mg/dL 0.61 0.65 0.69  Sodium 135 - 145 mmol/L 136 141 141  Potassium 3.5 - 5.1 mmol/L 3.8 3.9 3.9  Chloride 98 - 111 mmol/L 101 107 105  CO2 22 - 32 mmol/L '29 27 28  ' Calcium 8.9 - 10.3 mg/dL 9.1 8.6(L) 9.6  Total Protein 6.5 - 8.1 g/dL 7.3 7.0 7.3  Total Bilirubin 0.3 - 1.2 mg/dL 0.3 0.3 0.3  Alkaline Phos 38 - 126 U/L 78 68 80  AST 15 - 41 U/L 12(L) 14(L) 15  ALT 0 - 44 U/L '13 16 14    ' Lab Results  Component Value Date   WBC 19.9 (H) 11/04/2021   HGB 10.8 (L) 11/04/2021   HCT 32.0 (L) 11/04/2021   MCV 94.1 11/04/2021   PLT 200 11/04/2021   NEUTROABS PENDING 11/04/2021    ASSESSMENT & PLAN:  Malignant neoplasm of upper-outer quadrant of right breast in female, estrogen receptor negative ([place]) 07/08/2021: Palpable right breast lump. Diagnostic mammogram: showed highly suspicious right breast mass and an indeterminate single  right axillary lymph node with up to 5 mm diffuse cortical thickening. Biopsy: grade 2-3 invasive ductal carcinoma, DCIS, and right axillary lymph node negative for metastatic carcinoma; ER+(80%)/PR-/Her2-.   On the final pathology her estrogen receptor is negative and therefore patient has triple negative breast cancer   Recommendations: 1. Right mastectomy: Grade 3 IDC 2.8 cm with high-grade DCIS, margins negative, 0/3 lymph nodes negative, ER 0%, PR 0%, HER2 negative, Ki-67 40% (final pathology is triple negative disease) 2. adjuvant chemotherapy with dose dense Adriamycin and Cytoxan followed by Taxol and carboplatin 3. Genetic testing -------------------------------------------------------------------------------------------------- Current treatment: Cycle 4 dose dense Adriamycin and Cytoxan Chemo Toxicities: Denies any nausea or vomiting. She had fatigue for a few days and then got better. Chemo induced anemia: Today's hemoglobin is 10.8 Redness below the breasts: Improved with doxycycline   Echocardiogram 09/15/2021: EF 60 to 65%   Return to clinic in 2 weeks for cycle 1 Taxol Carboplatin   No orders of the defined types  were placed in this encounter.  The patient has a good understanding of the overall plan. she agrees with it. she will call with any problems that may develop before the next visit here. Total time spent: 30 mins including face to face time and time spent for planning, charting and co-ordination of care   Harriette Ohara, MD 11/04/21

## 2021-11-04 NOTE — Patient Instructions (Signed)
[facility] ONCOLOGY  Discharge Instructions: Thank you for choosing [facility] to provide your oncology and hematology care.   If you have a lab appointment with the [facility], please go directly to the [facility] and check in at the registration area.   Wear comfortable clothing and clothing appropriate for easy access to any Portacath or PICC line.   We strive to give you quality time with your provider. You may need to reschedule your appointment if you arrive late (15 or more minutes).  Arriving late affects you and other patients whose appointments are after yours.  Also, if you miss three or more appointments without notifying the office, you may be dismissed from the clinic at the providers discretion.      For prescription refill requests, have your pharmacy contact our office and allow 72 hours for refills to be completed.    Today you received the following chemotherapy and/or immunotherapy agents: doxorubicin and cytoxan      To help prevent nausea and vomiting after your treatment, we encourage you to take your nausea medication as directed.  BELOW ARE SYMPTOMS THAT SHOULD BE REPORTED IMMEDIATELY: *FEVER GREATER THAN 100.4 F (38 C) OR HIGHER *CHILLS OR SWEATING *NAUSEA AND VOMITING THAT IS NOT CONTROLLED WITH YOUR NAUSEA MEDICATION *UNUSUAL SHORTNESS OF BREATH *UNUSUAL BRUISING OR BLEEDING *URINARY PROBLEMS (pain or burning when urinating, or frequent urination) *BOWEL PROBLEMS (unusual diarrhea, constipation, pain near the anus) TENDERNESS IN MOUTH AND THROAT WITH OR WITHOUT PRESENCE OF ULCERS (sore throat, sores in mouth, or a toothache) UNUSUAL RASH, SWELLING OR PAIN  UNUSUAL VAGINAL DISCHARGE OR ITCHING   Items with * indicate a potential emergency and should be followed up as soon as possible or go to the Emergency Department if any problems should occur.  Please show the CHEMOTHERAPY ALERT CARD or IMMUNOTHERAPY ALERT CARD  at check-in to the Emergency Department and triage nurse.  Should you have questions after your visit or need to cancel or reschedule your appointment, please contact [facility]  Dept: [phone]  and follow the prompts.  Office hours are 8:00 a.m. to 4:30 p.m. Monday - Friday. Please note that voicemails left after 4:00 p.m. may not be returned until the following business day.  We are closed weekends and major holidays. You have access to a nurse at all times for urgent questions. Please call the main number to the clinic Dept: [phone] and follow the prompts.   For any non-urgent questions, you may also contact your provider using MyChart. We now offer e-Visits for anyone 3 and older to request care online for non-urgent symptoms. For details visit [email or web address].   Also download the MyChart app! Go to the app store, search "MyChart", open the app, select [facility], and log in with your MyChart username and password.  Due to Covid, a mask is required upon entering the hospital/clinic. If you do not have a mask, one will be given to you upon arrival. For doctor visits, patients may have 1 support person aged 55 or older with them. For treatment visits, patients cannot have anyone with them due to current Covid guidelines and our immunocompromised population.

## 2021-11-05 ENCOUNTER — Telehealth: Payer: Self-pay | Admitting: Hematology and Oncology

## 2021-11-05 DIAGNOSIS — F419 Anxiety disorder, unspecified: Secondary | ICD-10-CM | POA: Diagnosis not present

## 2021-11-05 DIAGNOSIS — G479 Sleep disorder, unspecified: Secondary | ICD-10-CM | POA: Diagnosis not present

## 2021-11-05 DIAGNOSIS — C44501 Unspecified malignant neoplasm of skin of breast: Secondary | ICD-10-CM | POA: Diagnosis not present

## 2021-11-05 NOTE — Telephone Encounter (Signed)
Scheduled appointments per 01/10 los. Patient aware. Patient prefers Tuesdays; if needing to reschedule.

## 2021-11-06 ENCOUNTER — Other Ambulatory Visit: Payer: Self-pay

## 2021-11-06 ENCOUNTER — Inpatient Hospital Stay: Payer: BC Managed Care – PPO

## 2021-11-06 VITALS — BP 106/56 | HR 74 | Temp 98.8°F | Resp 17

## 2021-11-06 DIAGNOSIS — C50411 Malignant neoplasm of upper-outer quadrant of right female breast: Secondary | ICD-10-CM

## 2021-11-06 DIAGNOSIS — Z171 Estrogen receptor negative status [ER-]: Secondary | ICD-10-CM

## 2021-11-06 DIAGNOSIS — Z5189 Encounter for other specified aftercare: Secondary | ICD-10-CM | POA: Diagnosis not present

## 2021-11-06 DIAGNOSIS — Z79899 Other long term (current) drug therapy: Secondary | ICD-10-CM | POA: Diagnosis not present

## 2021-11-06 DIAGNOSIS — D6481 Anemia due to antineoplastic chemotherapy: Secondary | ICD-10-CM | POA: Diagnosis not present

## 2021-11-06 DIAGNOSIS — Z9011 Acquired absence of right breast and nipple: Secondary | ICD-10-CM | POA: Diagnosis not present

## 2021-11-06 DIAGNOSIS — T451X5A Adverse effect of antineoplastic and immunosuppressive drugs, initial encounter: Secondary | ICD-10-CM | POA: Diagnosis not present

## 2021-11-06 DIAGNOSIS — Z5111 Encounter for antineoplastic chemotherapy: Secondary | ICD-10-CM | POA: Diagnosis not present

## 2021-11-06 MED ORDER — PEGFILGRASTIM-CBQV 6 MG/0.6ML ~~LOC~~ SOSY
6.0000 mg | PREFILLED_SYRINGE | Freq: Once | SUBCUTANEOUS | Status: AC
  Administered 2021-11-06: 6 mg via SUBCUTANEOUS
  Filled 2021-11-06: qty 0.6

## 2021-11-06 NOTE — Patient Instructions (Signed)

## 2021-11-10 ENCOUNTER — Encounter: Payer: Self-pay | Admitting: *Deleted

## 2021-11-10 DIAGNOSIS — Z9012 Acquired absence of left breast and nipple: Secondary | ICD-10-CM | POA: Diagnosis not present

## 2021-11-10 DIAGNOSIS — C50911 Malignant neoplasm of unspecified site of right female breast: Secondary | ICD-10-CM | POA: Diagnosis not present

## 2021-11-12 DIAGNOSIS — Z9012 Acquired absence of left breast and nipple: Secondary | ICD-10-CM | POA: Diagnosis not present

## 2021-11-12 DIAGNOSIS — C50911 Malignant neoplasm of unspecified site of right female breast: Secondary | ICD-10-CM | POA: Diagnosis not present

## 2021-11-13 ENCOUNTER — Other Ambulatory Visit: Payer: Self-pay | Admitting: *Deleted

## 2021-11-13 DIAGNOSIS — Z5181 Encounter for therapeutic drug level monitoring: Secondary | ICD-10-CM

## 2021-11-13 DIAGNOSIS — I471 Supraventricular tachycardia: Secondary | ICD-10-CM

## 2021-11-13 DIAGNOSIS — C50911 Malignant neoplasm of unspecified site of right female breast: Secondary | ICD-10-CM | POA: Diagnosis not present

## 2021-11-14 MED FILL — Dexamethasone Sodium Phosphate Inj 100 MG/10ML: INTRAMUSCULAR | Qty: 1 | Status: AC

## 2021-11-14 MED FILL — Fosaprepitant Dimeglumine For IV Infusion 150 MG (Base Eq): INTRAVENOUS | Qty: 5 | Status: AC

## 2021-11-15 NOTE — Progress Notes (Signed)
Patient Care Team: Crecencio Mc, MD as PCP - General (Internal Medicine) Mauro Kaufmann, RN as Oncology Nurse Navigator Rockwell Germany, RN as Oncology Nurse Navigator Jovita Kussmaul, MD as Consulting Physician (General Surgery) Nicholas Lose, MD as Consulting Physician (Hematology and Oncology) Eppie Gibson, MD as Attending Physician (Radiation Oncology)  DIAGNOSIS:    ICD-10-CM   1. Malignant neoplasm of upper-outer quadrant of right breast in female, estrogen receptor negative ([place])  C50.411    Z17.1       SUMMARY OF ONCOLOGIC HISTORY: Oncology History  Malignant neoplasm of upper-outer quadrant of right breast in female, estrogen receptor negative ([place])  07/08/2021 Initial Diagnosis   Palpable right breast lump. Diagnostic mammogram: showed highly suspicious right breast mass and an indeterminate single right axillary lymph node with up to 5 mm diffuse cortical thickening. Biopsy: grade 2-3 invasive ductal carcinoma, DCIS, and right axillary lymph node negative for metastatic carcinoma; ER+(80%)/PR-/Her2-.   07/16/2021 Cancer Staging   Staging form: Breast, AJCC 8th Edition - Clinical stage from 07/16/2021: Stage IIB (cT2, cN0, cM0, G3, ER+, PR-, HER2-) - Signed by Nicholas Lose, MD on 07/16/2021 Stage prefix: Initial diagnosis Histologic grading system: 3 grade system    08/18/2021 Surgery   Right mastectomy: Grade 3 IDC 2.8 cm with high-grade DCIS, margins negative, 0/3 lymph nodes negative, ER 0%, PR 0%, HER2 negative, Ki-67 60% (triple negative)   09/22/2021 -  Chemotherapy   Patient is on Treatment Plan : BREAST Dose Dense AC q14d / CARBOplatin D1 + PACLitaxel D1,8,15 q21d     10/06/2021 Genetic Testing   Negative hereditary cancer genetic testing: no pathogenic variants detected in [facility] CancerNext-Expanded +RNAinsight Panel.  The report date is 10/06/2021.   The CancerNext-Expanded gene panel offered by [facility] and includes sequencing, rearrangement, and  RNA analysis for the following 77 genes: AIP, ALK, APC, ATM, AXIN2, BAP1, BARD1, BLM, BMPR1A, BRCA1, BRCA2, BRIP1, CDC73, CDH1, CDK4, CDKN1B, CDKN2A, CHEK2, CTNNA1, DICER1, FANCC, FH, FLCN, GALNT12, KIF1B, LZTR1, MAX, MEN1, MET, MLH1, MSH2, MSH3, MSH6, MUTYH, NBN, NF1, NF2, NTHL1, PALB2, PHOX2B, PMS2, POT1, PRKAR1A, PTCH1, PTEN, RAD51C, RAD51D, RB1, RECQL, RET, SDHA, SDHAF2, SDHB, SDHC, SDHD, SMAD4, SMARCA4, SMARCB1, SMARCE1, STK11, SUFU, TMEM127, TP53, TSC1, TSC2, VHL and XRCC2 (sequencing and deletion/duplication); EGFR, EGLN1, HOXB13, KIT, MITF, PDGFRA, POLD1, and POLE (sequencing only); EPCAM and GREM1 (deletion/duplication only).      CHIEF COMPLIANT: Cycle Taxol and carboplatin  INTERVAL HISTORY: Katelyn Hobbs is a 46 y.o. with above-mentioned history of triple negative breast cancer who is currently on adjuvant treatment and today is cycle 1 of Taxol and carboplatin. She presents to the clinic today for treatment.  She is here for cycle 1 of Taxol and carboplatin.  She reports that after the last cycle of chemo with Adriamycin and Cytoxan and the Neulasta that she got diffuse body aches and pains which have finally subsided.  ALLERGIES:  is allergic to benadryl [diphenhydramine], latex, and phenergan [promethazine hcl].  MEDICATIONS:  Current Outpatient Medications  Medication Sig Dispense Refill   acetaminophen (TYLENOL) 500 MG tablet Take 1,000 mg by mouth every 8 (eight) hours as needed for moderate pain.     ALPRAZolam (XANAX XR) 2 MG 24 hr tablet TAKE 1 TABLET BY MOUTH EVERYDAY AT BEDTIME 30 tablet 2   cetirizine (ZYRTEC) 10 MG tablet Take 10 mg by mouth at bedtime.     citalopram (CELEXA) 40 MG tablet Take 1 tablet (40 mg total) by mouth daily. 90 tablet  1   ergocalciferol (DRISDOL) 1.25 MG (50000 UT) capsule Take 1 capsule (50,000 Units total) by mouth once a week. 12 capsule 0   ibuprofen (ADVIL) 200 MG tablet Take 800 mg by mouth every 8 (eight) hours as needed for moderate  pain.     lidocaine-prilocaine (EMLA) cream Apply to affected area once 30 g 3   LORazepam (ATIVAN) 0.5 MG tablet Take 1 tablet (0.5 mg total) by mouth at bedtime as needed for sleep. 30 tablet 0   ondansetron (ZOFRAN) 8 MG tablet Take 1 tablet (8 mg total) by mouth 2 (two) times daily as needed. Start on the third day after chemotherapy. 30 tablet 1   prochlorperazine (COMPAZINE) 10 MG tablet Take 1 tablet (10 mg total) by mouth every 6 (six) hours as needed (Nausea or vomiting). 30 tablet 1   No current facility-administered medications for this visit.    PHYSICAL EXAMINATION: ECOG PERFORMANCE STATUS: 1 - Symptomatic but completely ambulatory  Vitals:   11/17/21 1001  BP: 113/61  Pulse: 96  Resp: 17  Temp: 97.7 F (36.5 C)  SpO2: 98%   Filed Weights   11/17/21 1001  Weight: 181 lb 6.4 oz (82.3 kg)    BREAST: No palpable masses or nodules in either right or left breasts. No palpable axillary supraclavicular or infraclavicular adenopathy no breast tenderness or nipple discharge. (exam performed in the presence of a chaperone)  LABORATORY DATA:  I have reviewed the data as listed CMP Latest Ref Rng & Units 11/04/2021 10/21/2021 10/07/2021  Glucose 70 - 99 mg/dL 84 95 96  BUN 6 - 20 mg/dL _0 Creatinine 0.44 - 1.00 mg/dL 0.61 0.61 0.65  Sodium 135 - 145 mmol/L 139 136 141  Potassium 3.5 - 5.1 mmol/L 4.0 3.8 3.9  Chloride 98 - 111 mmol/L 103 101 107  CO2 22 - 32 mmol/L _1 Calcium 8.9 - 10.3 mg/dL 9.3 9.1 8.6(L)  Total Protein 6.5 - 8.1 g/dL 7.4 7.3 7.0  Total Bilirubin 0.3 - 1.2 mg/dL 0.3 0.3 0.3  Alkaline Phos 38 - 126 U/L 75 78 68  AST 15 - 41 U/L 16 12(L) 14(L)  ALT 0 - 44 U/L _2 Lab Results  Component Value Date   WBC 23.6 (H) 11/17/2021   HGB 10.3 (L) 11/17/2021   HCT 31.0 (L) 11/17/2021   MCV 95.7 11/17/2021   PLT 156 11/17/2021   NEUTROABS PENDING 11/17/2021    ASSESSMENT & PLAN:  Malignant neoplasm of upper-outer quadrant of right  breast in female, estrogen receptor negative ([place]) 07/08/2021: Palpable right breast lump. Diagnostic mammogram: showed highly suspicious right breast mass and an indeterminate single right axillary lymph node with up to 5 mm diffuse cortical thickening. Biopsy: grade 2-3 invasive ductal carcinoma, DCIS, and right axillary lymph node negative for metastatic carcinoma; ER+(80%)/PR-/Her2-.   On the final pathology her estrogen receptor is negative and therefore patient has triple negative breast cancer   Recommendations: 1. Right mastectomy: Grade 3 IDC 2.8 cm with high-grade DCIS, margins negative, 0/3 lymph nodes negative, ER 0%, PR 0%, HER2 negative, Ki-67 40% (final pathology is triple negative disease) 2. adjuvant chemotherapy with dose dense Adriamycin and Cytoxan followed by Taxol and carboplatin 3. Genetic testing -------------------------------------------------------------------------------------------------- Current treatment: Completed 4 cycles of dose dense Adriamycin and Cytoxan, today cycle 1 Taxol with carboplatin Chemo Toxicities: Diffuse body aches and pains after Neulasta injection Chemo induced anemia: Today's hemoglobin is 10.3  EKG: QT interval 420 ms, QTC 485 ms Echocardiogram 09/15/2021: EF 60 to 65%   She is currently on short-term disability and tells me that her employer is asking her to get her own medical insurance. Return to clinic in 1 week for cycle 2 Taxol and toxicity check    No orders of the defined types were placed in this encounter.  The patient has a good understanding of the overall plan. she agrees with it. she will call with any problems that may develop before the next visit here.  Total time spent: 30 mins including face to face time and time spent for planning, charting and coordination of care  Rulon Eisenmenger, MD, MPH 11/17/2021  I, Thana Ates, am acting as scribe for Dr. Nicholas Lose.  I have reviewed the above documentation for  accuracy and completeness, and I agree with the above.

## 2021-11-17 ENCOUNTER — Other Ambulatory Visit: Payer: Self-pay

## 2021-11-17 ENCOUNTER — Inpatient Hospital Stay: Payer: BC Managed Care – PPO

## 2021-11-17 ENCOUNTER — Inpatient Hospital Stay (HOSPITAL_BASED_OUTPATIENT_CLINIC_OR_DEPARTMENT_OTHER): Payer: BC Managed Care – PPO | Admitting: Hematology and Oncology

## 2021-11-17 VITALS — BP 112/68 | HR 81 | Resp 17

## 2021-11-17 DIAGNOSIS — D6481 Anemia due to antineoplastic chemotherapy: Secondary | ICD-10-CM | POA: Diagnosis not present

## 2021-11-17 DIAGNOSIS — Z95828 Presence of other vascular implants and grafts: Secondary | ICD-10-CM

## 2021-11-17 DIAGNOSIS — C50411 Malignant neoplasm of upper-outer quadrant of right female breast: Secondary | ICD-10-CM

## 2021-11-17 DIAGNOSIS — Z171 Estrogen receptor negative status [ER-]: Secondary | ICD-10-CM | POA: Diagnosis not present

## 2021-11-17 DIAGNOSIS — T451X5A Adverse effect of antineoplastic and immunosuppressive drugs, initial encounter: Secondary | ICD-10-CM | POA: Diagnosis not present

## 2021-11-17 DIAGNOSIS — Z9011 Acquired absence of right breast and nipple: Secondary | ICD-10-CM | POA: Diagnosis not present

## 2021-11-17 DIAGNOSIS — Z5189 Encounter for other specified aftercare: Secondary | ICD-10-CM | POA: Diagnosis not present

## 2021-11-17 DIAGNOSIS — Z5111 Encounter for antineoplastic chemotherapy: Secondary | ICD-10-CM | POA: Diagnosis not present

## 2021-11-17 DIAGNOSIS — Z170 Estrogen receptor positive status [ER+]: Secondary | ICD-10-CM

## 2021-11-17 DIAGNOSIS — Z79899 Other long term (current) drug therapy: Secondary | ICD-10-CM | POA: Diagnosis not present

## 2021-11-17 LAB — CMP (CANCER CENTER ONLY)
ALT: 16 U/L (ref 0–44)
AST: 16 U/L (ref 15–41)
Albumin: 4.4 g/dL (ref 3.5–5.0)
Alkaline Phosphatase: 86 U/L (ref 38–126)
Anion gap: 7 (ref 5–15)
BUN: 7 mg/dL (ref 6–20)
CO2: 28 mmol/L (ref 22–32)
Calcium: 8.9 mg/dL (ref 8.9–10.3)
Chloride: 104 mmol/L (ref 98–111)
Creatinine: 0.57 mg/dL (ref 0.44–1.00)
GFR, Estimated: >60 mL/min (ref >60)
Glucose, Bld: 98 mg/dL (ref 70–99)
Potassium: 4 mmol/L (ref 3.5–5.1)
Sodium: 139 mmol/L (ref 135–145)
Total Bilirubin: 0.3 mg/dL (ref 0.3–1.2)
Total Protein: 7.5 g/dL (ref 6.5–8.1)

## 2021-11-17 LAB — CBC WITH DIFFERENTIAL (CANCER CENTER ONLY)
Abs Immature Granulocytes: 1.2 10*3/uL — ABNORMAL HIGH (ref 0.00–0.07)
Band Neutrophils: 16 %
Basophils Absolute: 0 10*3/uL (ref 0.0–0.1)
Basophils Relative: 0 %
Eosinophils Absolute: 0 10*3/uL (ref 0.0–0.5)
Eosinophils Relative: 0 %
HCT: 31 % — ABNORMAL LOW (ref 36.0–46.0)
Hemoglobin: 10.3 g/dL — ABNORMAL LOW (ref 12.0–15.0)
Lymphocytes Relative: 7 %
Lymphs Abs: 1.7 10*3/uL (ref 0.7–4.0)
MCH: 31.8 pg (ref 26.0–34.0)
MCHC: 33.2 g/dL (ref 30.0–36.0)
MCV: 95.7 fL (ref 80.0–100.0)
Metamyelocytes Relative: 5 %
Monocytes Absolute: 1.2 10*3/uL — ABNORMAL HIGH (ref 0.1–1.0)
Monocytes Relative: 5 %
Neutro Abs: 19.6 10*3/uL — ABNORMAL HIGH (ref 1.7–7.7)
Neutrophils Relative %: 67 %
Platelet Count: 156 10*3/uL (ref 150–400)
RBC: 3.24 MIL/uL — ABNORMAL LOW (ref 3.87–5.11)
RDW: 15.8 % — ABNORMAL HIGH (ref 11.5–15.5)
Smear Review: NORMAL
WBC Count: 23.6 10*3/uL — ABNORMAL HIGH (ref 4.0–10.5)
nRBC: 0.3 % — ABNORMAL HIGH (ref 0.0–0.2)

## 2021-11-17 MED ORDER — DIPHENHYDRAMINE HCL 50 MG/ML IJ SOLN
12.5000 mg | Freq: Once | INTRAMUSCULAR | Status: AC
  Administered 2021-11-17: 12.5 mg via INTRAVENOUS
  Filled 2021-11-17: qty 1

## 2021-11-17 MED ORDER — HEPARIN SOD (PORK) LOCK FLUSH 100 UNIT/ML IV SOLN
500.0000 [IU] | Freq: Once | INTRAVENOUS | Status: AC | PRN
  Administered 2021-11-17: 500 [IU]

## 2021-11-17 MED ORDER — SODIUM CHLORIDE 0.9 % IV SOLN: Freq: Once | INTRAVENOUS | Status: AC

## 2021-11-17 MED ORDER — SODIUM CHLORIDE 0.9 % IV SOLN
10.0000 mg | Freq: Once | INTRAVENOUS | Status: AC
  Administered 2021-11-17: 10 mg via INTRAVENOUS
  Filled 2021-11-17: qty 10

## 2021-11-17 MED ORDER — SODIUM CHLORIDE 0.9% FLUSH
10.0000 mL | INTRAVENOUS | Status: DC | PRN
  Administered 2021-11-17: 10 mL

## 2021-11-17 MED ORDER — PALONOSETRON HCL INJECTION 0.25 MG/5ML
0.2500 mg | Freq: Once | INTRAVENOUS | Status: AC
  Administered 2021-11-17: 0.25 mg via INTRAVENOUS
  Filled 2021-11-17: qty 5

## 2021-11-17 MED ORDER — SODIUM CHLORIDE 0.9 % IV SOLN
150.0000 mg | Freq: Once | INTRAVENOUS | Status: AC
  Administered 2021-11-17: 150 mg via INTRAVENOUS
  Filled 2021-11-17: qty 150

## 2021-11-17 MED ORDER — SODIUM CHLORIDE 0.9% FLUSH
10.0000 mL | Freq: Once | INTRAVENOUS | Status: AC
  Administered 2021-11-17: 10 mL

## 2021-11-17 MED ORDER — SODIUM CHLORIDE 0.9 % IV SOLN
80.0000 mg/m2 | Freq: Once | INTRAVENOUS | Status: AC
  Administered 2021-11-17: 150 mg via INTRAVENOUS
  Filled 2021-11-17: qty 25

## 2021-11-17 MED ORDER — SODIUM CHLORIDE 0.9 % IV SOLN
700.0000 mg | Freq: Once | INTRAVENOUS | Status: AC
  Administered 2021-11-17: 700 mg via INTRAVENOUS
  Filled 2021-11-17: qty 70

## 2021-11-17 MED ORDER — FAMOTIDINE 20 MG IN NS 100 ML IVPB
20.0000 mg | Freq: Once | INTRAVENOUS | Status: AC
  Administered 2021-11-17: 20 mg via INTRAVENOUS
  Filled 2021-11-17: qty 100

## 2021-11-17 NOTE — Assessment & Plan Note (Signed)
07/08/2021:Palpable right breast lump. Diagnostic mammogram: showed highly suspicious right breast mass and an indeterminate single right axillary lymph node with up to 5 mm diffuse cortical thickening. Biopsy: grade 2-3 invasive ductal carcinoma, DCIS, and right axillary lymph node negative for metastatic carcinoma; ER+(80%)/PR-/Her2-.  On the final pathology her estrogen receptor is negative and therefore patient has triple negative breast cancer  Recommendations: 1.Right mastectomy: Grade 3 IDC 2.8 cm with high-grade DCIS, margins negative, 0/3 lymph nodes negative, ER 0%, PR 0%, HER2 negative, [id number]%(final pathology is triple negative disease) 2.adjuvant chemotherapy with dose dense Adriamycin and Cytoxan followed by Taxol and carboplatin 3. Genetic testing -------------------------------------------------------------------------------------------------- Current treatment: Completed 4 cycles ofdose dense Adriamycin and Cytoxan, today cycle 1 Taxol with carboplatin Chemo Toxicities: Denies any nausea or vomiting. She had fatigue for a few days and then got better. Chemo induced anemia: Today's hemoglobin is 10.8 Redness below the breasts: Improved with doxycycline  Echocardiogram 09/15/2021: EF 60 to 65%  Return to clinic in1 week for cycle 2 Taxol and toxicity check

## 2021-11-17 NOTE — Patient Instructions (Signed)
[facility] MEDICAL [facility]COLOG[facility]  Discharge Instructions: Thank you for choosing [facility] to provide your oncology and hematology care.   If you have a lab appointment with the Cancer Center, please go directly to the Cancer Center and check in at the registration area.   Wear comfortable clothing and clothing appropriate for easy access to any Portacath or PICC line.   We strive to give you quality time with your provider. You may need to reschedule your appointment if you arrive late (15 or more minutes).  Arriving late affects you and other patients whose appointments are after yours.  Also, if you miss three or more appointments without notifying the office, you may be dismissed from the clinic at the provider's discretion.      For prescription refill requests, have your pharmacy contact our office and allow 72 hours for refills to be completed.    Today you received the following chemotherapy and/or immunotherapy agents: Paclitaxel, Carboplatin.      To help prevent nausea and vomiting after your treatment, we encourage you to take your nausea medication as directed.  BELOW ARE SYMPTOMS THAT SHOULD BE REPORTED IMMEDIATELY: *FEVER GREATER THAN 100.4 F (38 C) OR HIGHER *CHILLS OR SWEATING *NAUSEA AND VOMITING THAT IS NOT CONTROLLED WITH YOUR NAUSEA MEDICATION *UNUSUAL SHORTNESS OF BREATH *UNUSUAL BRUISING OR BLEEDING *URINARY PROBLEMS (pain or burning when urinating, or frequent urination) *BOWEL PROBLEMS (unusual diarrhea, constipation, pain near the anus) TENDERNESS IN MOUTH AND THROAT WITH OR WITHOUT PRESENCE OF ULCERS (sore throat, sores in mouth, or a toothache) UNUSUAL RASH, SWELLING OR PAIN  UNUSUAL VAGINAL DISCHARGE OR ITCHING   Items with * indicate a potential emergency and should be followed up as soon as possible or go to the Emergency Department if any problems should occur.  Please show the CHEMOTHERAPY ALERT CARD or IMMUNOTHERAPY ALERT CARD  at check-in to the Emergency Department and triage nurse.  Should you have questions after your visit or need to cancel or reschedule your appointment, please contact [facility]  Dept: [phone]  and follow the prompts.  Office hours are 8:00 a.m. to 4:30 p.m. Monday - Friday. Please note that voicemails left after 4:00 p.m. may not be returned until the following business day.  We are closed weekends and major holidays. You have access to a nurse at all times for urgent questions. Please call the main number to the clinic Dept: [phone] and follow the prompts.   For any non-urgent questions, you may also contact your provider using MyChart. We now offer e-Visits for anyone 18 and older to request care online for non-urgent symptoms. For details visit [email or web address].   Also download the MyChart app! Go to the app store, search "MyChart", open the app, select [facility], and log in with your MyChart username and password.  Due to Covid, a mask is required upon entering the hospital/clinic. If you do not have a mask, one will be given to you upon arrival. For doctor visits, patients may have 1 support person aged 18 or older with them. For treatment visits, patients cannot have anyone with them due to current Covid guidelines and our immunocompromised population.     Paclitaxel injection What is this medication? PACLITAXEL (PAK li TAX el) is a chemotherapy drug. It targets fast dividing cells, like cancer cells, and causes these cells to die. This medicine is used to treat ovarian cancer, breast cancer, lung cancer, Kaposi's sarcoma, and other cancers.   This medicine may be used for other purposes; ask your health care provider or pharmacist if you have questions. COMMON BRAND NAME(S): Onxol, Taxol What should I tell my care team before I take this medication? They need to know if you have any of these conditions: history of irregular heartbeat liver  disease low blood counts, like low white cell, platelet, or red cell counts lung or breathing disease, like asthma tingling of the fingers or toes, or other nerve disorder an unusual or allergic reaction to paclitaxel, alcohol, polyoxyethylated castor oil, other chemotherapy, other medicines, foods, dyes, or preservatives pregnant or trying to get pregnant breast-feeding How should I use this medication? This drug is given as an infusion into a vein. It is administered in a hospital or clinic by a specially trained health care professional. Talk to your pediatrician regarding the use of this medicine in children. Special care may be needed. Overdosage: If you think you have taken too much of this medicine contact a poison control center or emergency room at once. NOTE: This medicine is only for you. Do not share this medicine with others. What if I miss a dose? It is important not to miss your dose. Call your doctor or health care professional if you are unable to keep an appointment. What may interact with this medication? Do not take this medicine with any of the following medications: live virus vaccines This medicine may also interact with the following medications: antiviral medicines for hepatitis, HIV or AIDS certain antibiotics like erythromycin and clarithromycin certain medicines for fungal infections like ketoconazole and itraconazole certain medicines for seizures like carbamazepine, phenobarbital, phenytoin gemfibrozil nefazodone rifampin St. John's wort This list may not describe all possible interactions. Give your health care provider a list of all the medicines, herbs, non-prescription drugs, or dietary supplements you use. Also tell them if you smoke, drink alcohol, or use illegal drugs. Some items may interact with your medicine. What should I watch for while using this medication? Your condition will be monitored carefully while you are receiving this medicine. You  will need important blood work done while you are taking this medicine. This medicine can cause serious allergic reactions. To reduce your risk you will need to take other medicine(s) before treatment with this medicine. If you experience allergic reactions like skin rash, itching or hives, swelling of the face, lips, or tongue, tell your doctor or health care professional right away. In some cases, you may be given additional medicines to help with side effects. Follow all directions for their use. This drug may make you feel generally unwell. This is not uncommon, as chemotherapy can affect healthy cells as well as cancer cells. Report any side effects. Continue your course of treatment even though you feel ill unless your doctor tells you to stop. Call your doctor or health care professional for advice if you get a fever, chills or sore throat, or other symptoms of a cold or flu. Do not treat yourself. This drug decreases your body's ability to fight infections. Try to avoid being around people who are sick. This medicine may increase your risk to bruise or bleed. Call your doctor or health care professional if you notice any unusual bleeding. Be careful brushing and flossing your teeth or using a toothpick because you may get an infection or bleed more easily. If you have any dental work done, tell your dentist you are receiving this medicine. Avoid taking products that contain aspirin, acetaminophen, ibuprofen, naproxen, or ketoprofen unless   instructed by your doctor. These medicines may hide a fever. Do not become pregnant while taking this medicine. Women should inform their doctor if they wish to become pregnant or think they might be pregnant. There is a potential for serious side effects to an unborn child. Talk to your health care professional or pharmacist for more information. Do not breast-feed an infant while taking this medicine. Men are advised not to father a child while receiving this  medicine. This product may contain alcohol. Ask your pharmacist or healthcare provider if this medicine contains alcohol. Be sure to tell all healthcare providers you are taking this medicine. Certain medicines, like metronidazole and disulfiram, can cause an unpleasant reaction when taken with alcohol. The reaction includes flushing, headache, nausea, vomiting, sweating, and increased thirst. The reaction can last from 30 minutes to several hours. What side effects may I notice from receiving this medication? Side effects that you should report to your doctor or health care professional as soon as possible: allergic reactions like skin rash, itching or hives, swelling of the face, lips, or tongue breathing problems changes in vision fast, irregular heartbeat high or low blood pressure mouth sores pain, tingling, numbness in the hands or feet signs of decreased platelets or bleeding - bruising, pinpoint red spots on the skin, black, tarry stools, blood in the urine signs of decreased red blood cells - unusually weak or tired, feeling faint or lightheaded, falls signs of infection - fever or chills, cough, sore throat, pain or difficulty passing urine signs and symptoms of liver injury like dark yellow or brown urine; general ill feeling or flu-like symptoms; light-colored stools; loss of appetite; nausea; right upper belly pain; unusually weak or tired; yellowing of the eyes or skin swelling of the ankles, feet, hands unusually slow heartbeat Side effects that usually do not require medical attention (report to your doctor or health care professional if they continue or are bothersome): diarrhea hair loss loss of appetite muscle or joint pain nausea, vomiting pain, redness, or irritation at site where injected tiredness This list may not describe all possible side effects. Call your doctor for medical advice about side effects. You may report side effects to FDA at 1-800-FDA-1088. Where  should I keep my medication? This drug is given in a hospital or clinic and will not be stored at home. NOTE: This sheet is a summary. It may not cover all possible information. If you have questions about this medicine, talk to your doctor, pharmacist, or health care provider.  2022 Elsevier/Gold Standard (2021-07-01 00:00:00)  Carboplatin injection What is this medication? CARBOPLATIN (KAR boe pla tin) is a chemotherapy drug. It targets fast dividing cells, like cancer cells, and causes these cells to die. This medicine is used to treat ovarian cancer and many other cancers. This medicine may be used for other purposes; ask your health care provider or pharmacist if you have questions. COMMON BRAND NAME(S): Paraplatin What should I tell my care team before I take this medication? They need to know if you have any of these conditions: blood disorders hearing problems kidney disease recent or ongoing radiation therapy an unusual or allergic reaction to carboplatin, cisplatin, other chemotherapy, other medicines, foods, dyes, or preservatives pregnant or trying to get pregnant breast-feeding How should I use this medication? This drug is usually given as an infusion into a vein. It is administered in a hospital or clinic by a specially trained health care professional. Talk to your pediatrician regarding the use of this   medicine in children. Special care may be needed. Overdosage: If you think you have taken too much of this medicine contact a poison control center or emergency room at once. NOTE: This medicine is only for you. Do not share this medicine with others. What if I miss a dose? It is important not to miss a dose. Call your doctor or health care professional if you are unable to keep an appointment. What may interact with this medication? medicines for seizures medicines to increase blood counts like filgrastim, pegfilgrastim, sargramostim some antibiotics like amikacin,  gentamicin, neomycin, streptomycin, tobramycin vaccines Talk to your doctor or health care professional before taking any of these medicines: acetaminophen aspirin ibuprofen ketoprofen naproxen This list may not describe all possible interactions. Give your health care provider a list of all the medicines, herbs, non-prescription drugs, or dietary supplements you use. Also tell them if you smoke, drink alcohol, or use illegal drugs. Some items may interact with your medicine. What should I watch for while using this medication? Your condition will be monitored carefully while you are receiving this medicine. You will need important blood work done while you are taking this medicine. This drug may make you feel generally unwell. This is not uncommon, as chemotherapy can affect healthy cells as well as cancer cells. Report any side effects. Continue your course of treatment even though you feel ill unless your doctor tells you to stop. In some cases, you may be given additional medicines to help with side effects. Follow all directions for their use. Call your doctor or health care professional for advice if you get a fever, chills or sore throat, or other symptoms of a cold or flu. Do not treat yourself. This drug decreases your body's ability to fight infections. Try to avoid being around people who are sick. This medicine may increase your risk to bruise or bleed. Call your doctor or health care professional if you notice any unusual bleeding. Be careful brushing and flossing your teeth or using a toothpick because you may get an infection or bleed more easily. If you have any dental work done, tell your dentist you are receiving this medicine. Avoid taking products that contain aspirin, acetaminophen, ibuprofen, naproxen, or ketoprofen unless instructed by your doctor. These medicines may hide a fever. Do not become pregnant while taking this medicine. Women should inform their doctor if they wish  to become pregnant or think they might be pregnant. There is a potential for serious side effects to an unborn child. Talk to your health care professional or pharmacist for more information. Do not breast-feed an infant while taking this medicine. What side effects may I notice from receiving this medication? Side effects that you should report to your doctor or health care professional as soon as possible: allergic reactions like skin rash, itching or hives, swelling of the face, lips, or tongue signs of infection - fever or chills, cough, sore throat, pain or difficulty passing urine signs of decreased platelets or bleeding - bruising, pinpoint red spots on the skin, black, tarry stools, nosebleeds signs of decreased red blood cells - unusually weak or tired, fainting spells, lightheadedness breathing problems changes in hearing changes in vision chest pain high blood pressure low blood counts - This drug may decrease the number of white blood cells, red blood cells and platelets. You may be at increased risk for infections and bleeding. nausea and vomiting pain, swelling, redness or irritation at the injection site pain, tingling, numbness in the hands   or feet problems with balance, talking, walking trouble passing urine or change in the amount of urine Side effects that usually do not require medical attention (report to your doctor or health care professional if they continue or are bothersome): hair loss loss of appetite metallic taste in the mouth or changes in taste This list may not describe all possible side effects. Call your doctor for medical advice about side effects. You may report side effects to FDA at 1-800-FDA-1088. Where should I keep my medication? This drug is given in a hospital or clinic and will not be stored at home. NOTE: This sheet is a summary. It may not cover all possible information. If you have questions about this medicine, talk to your doctor, pharmacist,  or health care provider.  2022 Elsevier/Gold Standard (2008-03-21 00:00:00)  

## 2021-11-17 NOTE — Progress Notes (Signed)
Okay to proceed with treatment while CMP results pending per Dr. Lindi Adie

## 2021-11-17 NOTE — Progress Notes (Signed)
Confirmed Carbo dose is capped @ 700 mg w/ Dr. Lindi Adie.  Kennith [place], Pharm.D., CPP 11/17/2021@11 :14 AM

## 2021-11-18 ENCOUNTER — Encounter: Payer: Self-pay | Admitting: Licensed Clinical Social Worker

## 2021-11-18 NOTE — Progress Notes (Signed)
[facility] Work  Clinical Social Work received call from patient with questions about insurance and assistance.  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 She is currently on short-term disability (no FMLA due to size of company). Per pt, her employer may be trying to make her pay her full insurance premium (her portion plus employer's part) while on STD which she cannot afford. She has spoken with the equal employment commission already.  CSW reviewed other options for assistance, including Triage Cancer navigation program for legal viewpoint on the above, [employer], and Medicaid.  Also sent information on foundations that may offer financial assistance.     [place], [place] Worker [employer]

## 2021-11-20 ENCOUNTER — Other Ambulatory Visit: Payer: Self-pay

## 2021-11-20 ENCOUNTER — Ambulatory Visit: Payer: BC Managed Care – PPO | Attending: General Surgery | Admitting: Physical Therapy

## 2021-11-20 DIAGNOSIS — Z483 Aftercare following surgery for neoplasm: Secondary | ICD-10-CM

## 2021-11-20 NOTE — Therapy (Addendum)
[facility] @ [address] [place], [place], [address] Phone: [phone]   Fax:  [phone]  Physical Therapy Treatment  Patient Details  Name: Katelyn Hobbs MRN: [id number] Date of Birth: 06-10-1976 Referring Provider (PT): Dr. Autumn Messing   Encounter Date: 11/20/2021   PT End of Session - 11/20/21 1133     Visit Number 2    Number of Visits 2    PT Start Time 1053    PT Stop Time 1100    PT Time Calculation (min) 7 min    Activity Tolerance Patient tolerated treatment well    Behavior During Therapy [facility] for tasks assessed/performed             Past Medical History:  Diagnosis Date   Abnormal uterine bleeding    ADHD (attention deficit hyperactivity disorder)    Anemia    Anxiety    Bacterial vaginosis    Cancer ([place]) 06/2021   breast cancer   Complication of anesthesia    Depression    Endometriosis    Family history of breast cancer 07/17/2021   Family history of melanoma 07/17/2021   Family history of prostate cancer 07/17/2021   Family history of uterine cancer 07/17/2021   Hyperlipidemia    Migraine    PCOS (polycystic ovarian syndrome)    PONV (postoperative nausea and vomiting)     Past Surgical History:  Procedure Laterality Date   IR IMAGING GUIDED PORT INSERTION  09/09/2021   LAPAROSCOPY  1993   MASTECTOMY W/ SENTINEL NODE BIOPSY Right 08/18/2021   Procedure: RIGHT MASTECTOMY WITH RIGHT SENTINEL LYMPH NODE BIOPSY;  Surgeon: Jovita Kussmaul, MD;  Location: [facility];  Service: General;  Laterality: Right;   SALPINGECTOMY Bilateral 2015   TOTAL MASTECTOMY Left 08/18/2021   Procedure: LEFT TOTAL MASTECTOMY;  Surgeon: Jovita Kussmaul, MD;  Location: [facility];  Service: General;  Laterality: Left;   VAGINAL HYSTERECTOMY  2017    There were no vitals filed for this visit.   Subjective Assessment - 11/20/21 1134     Subjective Pt here for L-Dex screen                    L-DEX  FLOWSHEETS - 11/20/21 1100       L-DEX LYMPHEDEMA SCREENING   Measurement Type Unilateral    L-DEX MEASUREMENT EXTREMITY Upper Extremity    POSITION  Standing    DOMINANT SIDE Right    At Risk Side Right    BASELINE SCORE (UNILATERAL) 1.1    L-DEX SCORE (UNILATERAL) 4.2    VALUE CHANGE (UNILAT) 3.1                                     PT Long Term Goals - 09/11/21 1337       PT LONG TERM GOAL #1   Title Patient will demonstrate she has regained full shoulder ROM and function post operatively compared to baselines.    Time 8    Period Weeks    Status Partially Met                   Plan - 11/20/21 1135     Clinical Impression Statement Continue L-Dex screens every 3 months. Today she was within recommended range based on her baseline.    PT Next Visit Plan Continue L-Dex screens every 3 months  Consulted and Agree with Plan of Care Patient             Patient will benefit from skilled therapeutic intervention in order to improve the following deficits and impairments:     Visit Diagnosis: Aftercare following surgery for neoplasm     Problem List Patient Active Problem List   Diagnosis Date Noted   Port-A-Cath in place 10/21/2021   Cancer of right female breast ([facility]) 08/18/2021   Family history of uterine cancer 07/17/2021   Family history of breast cancer 07/17/2021   Family history of prostate cancer 07/17/2021   Family history of melanoma 07/17/2021   Genetic testing 07/17/2021   Malignant neoplasm of upper-outer quadrant of right breast in female, estrogen receptor negative ([place]) 07/14/2021   Otitis media 06/11/2021   Mass of breast, right 06/11/2021   Breast calcifications on mammogram 03/16/2021   Encounter for preventive health examination 01/08/2021   Primary insomnia 09/24/2020   PCOS (polycystic ovarian syndrome) 04/16/2020   Paroxysmal SVT (supraventricular tachycardia) ([place]) 04/16/2020   Screen for STD  (sexually transmitted disease) 04/16/2020   S/P total hysterectomy 04/16/2020   GAD (generalized anxiety disorder) 11/11/2019   Vitamin D deficiency 09/21/2015   Elevated glucose level 09/21/2015   Obesity (BMI 30.0-34.9) 09/17/2015   Annia Friendly, PT 11/20/21 11:41 AM   [facility] @ [address] [place], [place], [address] Phone: [phone]   Fax:  [phone]  Name: Katelyn Hobbs MRN: [id number] Date of Birth: 1976-05-16

## 2021-11-24 MED FILL — Dexamethasone Sodium Phosphate Inj 100 MG/10ML: INTRAMUSCULAR | Qty: 1 | Status: AC

## 2021-11-24 NOTE — Progress Notes (Signed)
Patient Care Team: Crecencio Mc, MD as PCP - General (Internal Medicine) Mauro Kaufmann, RN as Oncology Nurse Navigator Rockwell Germany, RN as Oncology Nurse Navigator Jovita Kussmaul, MD as Consulting Physician (General Surgery) Nicholas Lose, MD as Consulting Physician (Hematology and Oncology) Eppie Gibson, MD as Attending Physician (Radiation Oncology)  DIAGNOSIS:    ICD-10-CM   1. Malignant neoplasm of upper-outer quadrant of right breast in female, estrogen receptor negative ([place])  C50.411    Z17.1       SUMMARY OF ONCOLOGIC HISTORY: Oncology History  Malignant neoplasm of upper-outer quadrant of right breast in female, estrogen receptor negative ([place])  07/08/2021 Initial Diagnosis   Palpable right breast lump. Diagnostic mammogram: showed highly suspicious right breast mass and an indeterminate single right axillary lymph node with up to 5 mm diffuse cortical thickening. Biopsy: grade 2-3 invasive ductal carcinoma, DCIS, and right axillary lymph node negative for metastatic carcinoma; ER+(80%)/PR-/Her2-.   07/16/2021 Cancer Staging   Staging form: Breast, AJCC 8th Edition - Clinical stage from 07/16/2021: Stage IIB (cT2, cN0, cM0, G3, ER+, PR-, HER2-) - Signed by Nicholas Lose, MD on 07/16/2021 Stage prefix: Initial diagnosis Histologic grading system: 3 grade system    08/18/2021 Surgery   Right mastectomy: Grade 3 IDC 2.8 cm with high-grade DCIS, margins negative, 0/3 lymph nodes negative, ER 0%, PR 0%, HER2 negative, Ki-67 60% (triple negative)   09/22/2021 -  Chemotherapy   Patient is on Treatment Plan : BREAST Dose Dense AC q14d / CARBOplatin D1 + PACLitaxel D1,8,15 q21d     10/06/2021 Genetic Testing   Negative hereditary cancer genetic testing: no pathogenic variants detected in [facility] CancerNext-Expanded +RNAinsight Panel.  The report date is 10/06/2021.   The CancerNext-Expanded gene panel offered by [facility] and includes sequencing, rearrangement, and  RNA analysis for the following 77 genes: AIP, ALK, APC, ATM, AXIN2, BAP1, BARD1, BLM, BMPR1A, BRCA1, BRCA2, BRIP1, CDC73, CDH1, CDK4, CDKN1B, CDKN2A, CHEK2, CTNNA1, DICER1, FANCC, FH, FLCN, GALNT12, KIF1B, LZTR1, MAX, MEN1, MET, MLH1, MSH2, MSH3, MSH6, MUTYH, NBN, NF1, NF2, NTHL1, PALB2, PHOX2B, PMS2, POT1, PRKAR1A, PTCH1, PTEN, RAD51C, RAD51D, RB1, RECQL, RET, SDHA, SDHAF2, SDHB, SDHC, SDHD, SMAD4, SMARCA4, SMARCB1, SMARCE1, STK11, SUFU, TMEM127, TP53, TSC1, TSC2, VHL and XRCC2 (sequencing and deletion/duplication); EGFR, EGLN1, HOXB13, KIT, MITF, PDGFRA, POLD1, and POLE (sequencing only); EPCAM and GREM1 (deletion/duplication only).      CHIEF COMPLIANT: Cycle 2 Taxol (carboplatin every 3 weeks)  INTERVAL HISTORY: NALEIGHA Katelyn [facility] is a 46 y.o. with above-mentioned history of triple negative breast cancer who is currently on adjuvant treatment with taxol and carboplatin. She presents to the clinic today for treatment.  She tolerated cycle 1 of Taxol and carboplatin extremely well.  Did not have any nausea or vomiting.  She is here for week 2 of Taxol.     ALLERGIES:  is allergic to benadryl [diphenhydramine], latex, and phenergan [promethazine hcl].  MEDICATIONS:  Current Outpatient Medications  Medication Sig Dispense Refill   acetaminophen (TYLENOL) 500 MG tablet Take 1,000 mg by mouth every 8 (eight) hours as needed for moderate pain.     ALPRAZolam (XANAX XR) 2 MG 24 hr tablet TAKE 1 TABLET BY MOUTH EVERYDAY AT BEDTIME 30 tablet 2   cetirizine (ZYRTEC) 10 MG tablet Take 10 mg by mouth at bedtime.     citalopram (CELEXA) 40 MG tablet Take 1 tablet (40 mg total) by mouth daily. 90 tablet 1   ergocalciferol (DRISDOL) 1.25 MG (50000 UT) capsule Take  1 capsule (50,000 Units total) by mouth once a week. 12 capsule 0   ibuprofen (ADVIL) 200 MG tablet Take 800 mg by mouth every 8 (eight) hours as needed for moderate pain.     lidocaine-prilocaine (EMLA) cream Apply to affected area once 30 g 3    LORazepam (ATIVAN) 0.5 MG tablet Take 1 tablet (0.5 mg total) by mouth at bedtime as needed for sleep. 30 tablet 0   ondansetron (ZOFRAN) 8 MG tablet Take 1 tablet (8 mg total) by mouth 2 (two) times daily as needed. Start on the third day after chemotherapy. 30 tablet 1   prochlorperazine (COMPAZINE) 10 MG tablet Take 1 tablet (10 mg total) by mouth every 6 (six) hours as needed (Nausea or vomiting). 30 tablet 1   No current facility-administered medications for this visit.    PHYSICAL EXAMINATION: ECOG PERFORMANCE STATUS: 1 - Symptomatic but completely ambulatory  Vitals:   11/25/21 0812  BP: (!) 112/56  Pulse: (!) 103  Resp: 18  Temp: (!) 97.3 F (36.3 C)  SpO2: 99%   Filed Weights   11/25/21 0812  Weight: 183 lb 9.6 oz (83.3 kg)    LABORATORY DATA:  I have reviewed the data as listed CMP Latest Ref Rng & Units 11/17/2021 11/04/2021 10/21/2021  Glucose 70 - 99 mg/dL 98 84 95  BUN 6 - 20 mg/dL _0 Creatinine 0.44 - 1.00 mg/dL 0.57 0.61 0.61  Sodium 135 - 145 mmol/L 139 139 136  Potassium 3.5 - 5.1 mmol/L 4.0 4.0 3.8  Chloride 98 - 111 mmol/L 104 103 101  CO2 22 - 32 mmol/L _1 Calcium 8.9 - 10.3 mg/dL 8.9 9.3 9.1  Total Protein 6.5 - 8.1 g/dL 7.5 7.4 7.3  Total Bilirubin 0.3 - 1.2 mg/dL 0.3 0.3 0.3  Alkaline Phos 38 - 126 U/L 86 75 78  AST 15 - 41 U/L 16 16 12(L)  ALT 0 - 44 U/L _2 Lab Results  Component Value Date   WBC 3.4 (L) 11/25/2021   HGB 9.8 (L) 11/25/2021   HCT 29.0 (L) 11/25/2021   MCV 93.9 11/25/2021   PLT 219 11/25/2021   NEUTROABS 2.1 11/25/2021    ASSESSMENT & PLAN:  Malignant neoplasm of upper-outer quadrant of right breast in female, estrogen receptor negative ([place]) 07/08/2021: Palpable right breast lump. Diagnostic mammogram: showed highly suspicious right breast mass and an indeterminate single right axillary lymph node with up to 5 mm diffuse cortical thickening. Biopsy: grade 2-3 invasive ductal carcinoma, DCIS, and right  axillary lymph node negative for metastatic carcinoma; ER+(80%)/PR-/Her2-.   On the final pathology her estrogen receptor is negative and therefore patient has triple negative breast cancer   Recommendations: 1. Right mastectomy: Grade 3 IDC 2.8 cm with high-grade DCIS, margins negative, 0/3 lymph nodes negative, ER 0%, PR 0%, HER2 negative, Ki-67 40% (final pathology is triple negative disease) 2. adjuvant chemotherapy with dose dense Adriamycin and Cytoxan followed by Taxol and carboplatin 3. Genetic testing -------------------------------------------------------------------------------------------------- Current treatment: Completed 4 cycles of dose dense Adriamycin and Cytoxan, today is cycle 2 Taxol with carboplatin (every 3 weeks)  Chemo Toxicities: 1. Chemo induced anemia: Today's hemoglobin is 9.8   2. leukopenia: Monitoring closely.  Today ANC is 2.1.  I discussed with her that we may have to reduce the dosage or give her Granix injections if necessary.  Return to clinic weekly for Taxol and every other week for follow-up with me.  No orders of the defined types were placed in this encounter.  The patient has a good understanding of the overall plan. she agrees with it. she will call with any problems that may develop before the next visit here.  Total time spent: 30 mins including face to face time and time spent for planning, charting and coordination of care  Rulon Eisenmenger, MD, MPH 11/25/2021  I, Thana Ates, am acting as scribe for Dr. Nicholas Lose.  I have reviewed the above documentation for accuracy and completeness, and I agree with the above.

## 2021-11-25 ENCOUNTER — Inpatient Hospital Stay (HOSPITAL_BASED_OUTPATIENT_CLINIC_OR_DEPARTMENT_OTHER): Payer: BC Managed Care – PPO | Admitting: Hematology and Oncology

## 2021-11-25 ENCOUNTER — Inpatient Hospital Stay: Payer: BC Managed Care – PPO

## 2021-11-25 ENCOUNTER — Other Ambulatory Visit: Payer: Self-pay

## 2021-11-25 VITALS — HR 91

## 2021-11-25 DIAGNOSIS — Z171 Estrogen receptor negative status [ER-]: Secondary | ICD-10-CM

## 2021-11-25 DIAGNOSIS — Z95828 Presence of other vascular implants and grafts: Secondary | ICD-10-CM

## 2021-11-25 DIAGNOSIS — Z170 Estrogen receptor positive status [ER+]: Secondary | ICD-10-CM

## 2021-11-25 DIAGNOSIS — D6481 Anemia due to antineoplastic chemotherapy: Secondary | ICD-10-CM | POA: Diagnosis not present

## 2021-11-25 DIAGNOSIS — C50411 Malignant neoplasm of upper-outer quadrant of right female breast: Secondary | ICD-10-CM

## 2021-11-25 DIAGNOSIS — Z79899 Other long term (current) drug therapy: Secondary | ICD-10-CM | POA: Diagnosis not present

## 2021-11-25 DIAGNOSIS — Z9011 Acquired absence of right breast and nipple: Secondary | ICD-10-CM | POA: Diagnosis not present

## 2021-11-25 DIAGNOSIS — T451X5A Adverse effect of antineoplastic and immunosuppressive drugs, initial encounter: Secondary | ICD-10-CM | POA: Diagnosis not present

## 2021-11-25 DIAGNOSIS — Z5189 Encounter for other specified aftercare: Secondary | ICD-10-CM | POA: Diagnosis not present

## 2021-11-25 DIAGNOSIS — Z5111 Encounter for antineoplastic chemotherapy: Secondary | ICD-10-CM | POA: Diagnosis not present

## 2021-11-25 LAB — CBC WITH DIFFERENTIAL (CANCER CENTER ONLY)
Abs Immature Granulocytes: 0.02 10*3/uL (ref 0.00–0.07)
Basophils Absolute: 0.1 10*3/uL (ref 0.0–0.1)
Basophils Relative: 2 %
Eosinophils Absolute: 0 10*3/uL (ref 0.0–0.5)
Eosinophils Relative: 1 %
HCT: 29 % — ABNORMAL LOW (ref 36.0–46.0)
Hemoglobin: 9.8 g/dL — ABNORMAL LOW (ref 12.0–15.0)
Immature Granulocytes: 1 %
Lymphocytes Relative: 18 %
Lymphs Abs: 0.6 10*3/uL — ABNORMAL LOW (ref 0.7–4.0)
MCH: 31.7 pg (ref 26.0–34.0)
MCHC: 33.8 g/dL (ref 30.0–36.0)
MCV: 93.9 fL (ref 80.0–100.0)
Monocytes Absolute: 0.6 10*3/uL (ref 0.1–1.0)
Monocytes Relative: 16 %
Neutro Abs: 2.1 10*3/uL (ref 1.7–7.7)
Neutrophils Relative %: 62 %
Platelet Count: 219 10*3/uL (ref 150–400)
RBC: 3.09 MIL/uL — ABNORMAL LOW (ref 3.87–5.11)
RDW: 14.6 % (ref 11.5–15.5)
WBC Count: 3.4 10*3/uL — ABNORMAL LOW (ref 4.0–10.5)
nRBC: 0 % (ref 0.0–0.2)

## 2021-11-25 LAB — CMP (CANCER CENTER ONLY)
ALT: 19 U/L (ref 0–44)
AST: 15 U/L (ref 15–41)
Albumin: 4.3 g/dL (ref 3.5–5.0)
Alkaline Phosphatase: 53 U/L (ref 38–126)
Anion gap: 8 (ref 5–15)
BUN: 8 mg/dL (ref 6–20)
CO2: 29 mmol/L (ref 22–32)
Calcium: 9.5 mg/dL (ref 8.9–10.3)
Chloride: 103 mmol/L (ref 98–111)
Creatinine: 0.61 mg/dL (ref 0.44–1.00)
GFR, Estimated: >60 mL/min (ref >60)
Glucose, Bld: 110 mg/dL — ABNORMAL HIGH (ref 70–99)
Potassium: 3.8 mmol/L (ref 3.5–5.1)
Sodium: 140 mmol/L (ref 135–145)
Total Bilirubin: 0.3 mg/dL (ref 0.3–1.2)
Total Protein: 7 g/dL (ref 6.5–8.1)

## 2021-11-25 MED ORDER — SODIUM CHLORIDE 0.9% FLUSH
10.0000 mL | INTRAVENOUS | Status: DC | PRN
  Administered 2021-11-25: 10 mL

## 2021-11-25 MED ORDER — FAMOTIDINE IN NACL 20-0.9 MG/50ML-% IV SOLN
20.0000 mg | Freq: Once | INTRAVENOUS | Status: AC
  Administered 2021-11-25: 20 mg via INTRAVENOUS
  Filled 2021-11-25: qty 50

## 2021-11-25 MED ORDER — DIPHENHYDRAMINE HCL 50 MG/ML IJ SOLN
12.5000 mg | Freq: Once | INTRAMUSCULAR | Status: AC
  Administered 2021-11-25: 12.5 mg via INTRAVENOUS
  Filled 2021-11-25: qty 1

## 2021-11-25 MED ORDER — HEPARIN SOD (PORK) LOCK FLUSH 100 UNIT/ML IV SOLN
500.0000 [IU] | Freq: Once | INTRAVENOUS | Status: AC | PRN
  Administered 2021-11-25: 500 [IU]

## 2021-11-25 MED ORDER — SODIUM CHLORIDE 0.9% FLUSH
10.0000 mL | Freq: Once | INTRAVENOUS | Status: AC
  Administered 2021-11-25: 10 mL

## 2021-11-25 MED ORDER — SODIUM CHLORIDE 0.9 % IV SOLN
80.0000 mg/m2 | Freq: Once | INTRAVENOUS | Status: AC
  Administered 2021-11-25: 150 mg via INTRAVENOUS
  Filled 2021-11-25: qty 25

## 2021-11-25 MED ORDER — SODIUM CHLORIDE 0.9 % IV SOLN
10.0000 mg | Freq: Once | INTRAVENOUS | Status: AC
  Administered 2021-11-25: 10 mg via INTRAVENOUS
  Filled 2021-11-25: qty 10

## 2021-11-25 MED ORDER — SODIUM CHLORIDE 0.9 % IV SOLN: Freq: Once | INTRAVENOUS | Status: AC

## 2021-11-25 NOTE — Patient Instructions (Signed)
[facility]  Discharge Instructions: Thank you for choosing [facility] to provide your oncology and hematology care.   If you have a lab appointment with the Cancer Center, please go directly to the Cancer Center and check in at the registration area.   Wear comfortable clothing and clothing appropriate for easy access to any Portacath or PICC line.   We strive to give you quality time with your provider. You may need to reschedule your appointment if you arrive late (15 or more minutes).  Arriving late affects you and other patients whose appointments are after yours.  Also, if you miss three or more appointments without notifying the office, you may be dismissed from the clinic at the provider's discretion.      For prescription refill requests, have your pharmacy contact our office and allow 72 hours for refills to be completed.    Today you received the following chemotherapy and/or immunotherapy agent: Paclitaxel (Taxol)   To help prevent nausea and vomiting after your treatment, we encourage you to take your nausea medication as directed.  BELOW ARE SYMPTOMS THAT SHOULD BE REPORTED IMMEDIATELY: *FEVER GREATER THAN 100.4 F (38 C) OR HIGHER *CHILLS OR SWEATING *NAUSEA AND VOMITING THAT IS NOT CONTROLLED WITH YOUR NAUSEA MEDICATION *UNUSUAL SHORTNESS OF BREATH *UNUSUAL BRUISING OR BLEEDING *URINARY PROBLEMS (pain or burning when urinating, or frequent urination) *BOWEL PROBLEMS (unusual diarrhea, constipation, pain near the anus) TENDERNESS IN MOUTH AND THROAT WITH OR WITHOUT PRESENCE OF ULCERS (sore throat, sores in mouth, or a toothache) UNUSUAL RASH, SWELLING OR PAIN  UNUSUAL VAGINAL DISCHARGE OR ITCHING   Items with * indicate a potential emergency and should be followed up as soon as possible or go to the Emergency Department if any problems should occur.  Please show the CHEMOTHERAPY ALERT CARD or IMMUNOTHERAPY ALERT CARD at  check-in to the Emergency Department and triage nurse.  Should you have questions after your visit or need to cancel or reschedule your appointment, please contact [facility]  Dept: [phone]  and follow the prompts.  Office hours are 8:00 a.m. to 4:30 p.m. Monday - Friday. Please note that voicemails left after 4:00 p.m. may not be returned until the following business day.  We are closed weekends and major holidays. You have access to a nurse at all times for urgent questions. Please call the main number to the clinic Dept: [phone] and follow the prompts.   For any non-urgent questions, you may also contact your provider using MyChart. We now offer e-Visits for anyone 18 and older to request care online for non-urgent symptoms. For details visit [email or web address].   Also download the MyChart app! Go to the app store, search "MyChart", open the app, select [facility], and log in with your MyChart username and password.  Due to Covid, a mask is required upon entering the hospital/clinic. If you do not have a mask, one will be given to you upon arrival. For doctor visits, patients may have 1 support person aged 18 or older with them. For treatment visits, patients cannot have anyone with them due to current Covid guidelines and our immunocompromised population.  

## 2021-11-25 NOTE — Assessment & Plan Note (Addendum)
07/08/2021:Palpable right breast lump. Diagnostic mammogram: showed highly suspicious right breast mass and an indeterminate single right axillary lymph node with up to 5 mm diffuse cortical thickening. Biopsy: grade 2-3 invasive ductal carcinoma, DCIS, and right axillary lymph node negative for metastatic carcinoma; ER+(80%)/PR-/Her2-.  On the final pathology her estrogen receptor is negative and therefore patient has triple negative breast cancer  Recommendations: 1.Right mastectomy: Grade 3 IDC 2.8 cm with high-grade DCIS, margins negative, 0/3 lymph nodes negative, ER 0%, PR 0%, HER2 negative, Ki-[id number]%(final pathology is triple negative disease) 2.adjuvant chemotherapy with dose dense Adriamycin and Cytoxan followed by Taxol and carboplatin 3. Genetic testing -------------------------------------------------------------------------------------------------- Current treatment: Completed 4 cycles ofdose dense Adriamycin and Cytoxan, today cycle 2 Taxol with carboplatin (every 3 weeks)  Chemo Toxicities: 1. Chemo induced anemia: Today's hemoglobin is 10.3   Return to clinic weekly for Taxol and every other week for follow-up with me.

## 2021-12-01 ENCOUNTER — Other Ambulatory Visit: Payer: Self-pay

## 2021-12-01 MED FILL — Dexamethasone Sodium Phosphate Inj 100 MG/10ML: INTRAMUSCULAR | Qty: 1 | Status: AC

## 2021-12-01 NOTE — Progress Notes (Signed)
Patient has been experiencing nausea on paclitaxel only days (D8 and 15). Aloxi added to premedications for these days per request.  Benn Moulder, PharmD Pharmacy Resident  12/01/2021 10:22 AM

## 2021-12-02 ENCOUNTER — Other Ambulatory Visit: Payer: Self-pay

## 2021-12-02 ENCOUNTER — Inpatient Hospital Stay: Payer: BC Managed Care – PPO

## 2021-12-02 ENCOUNTER — Inpatient Hospital Stay: Payer: BC Managed Care – PPO | Attending: Hematology and Oncology

## 2021-12-02 VITALS — BP 104/62 | HR 81 | Temp 98.0°F | Resp 18 | Ht 64.0 in | Wt 188.0 lb

## 2021-12-02 DIAGNOSIS — Z9221 Personal history of antineoplastic chemotherapy: Secondary | ICD-10-CM | POA: Diagnosis not present

## 2021-12-02 DIAGNOSIS — Z9011 Acquired absence of right breast and nipple: Secondary | ICD-10-CM | POA: Insufficient documentation

## 2021-12-02 DIAGNOSIS — Z171 Estrogen receptor negative status [ER-]: Secondary | ICD-10-CM

## 2021-12-02 DIAGNOSIS — D6481 Anemia due to antineoplastic chemotherapy: Secondary | ICD-10-CM | POA: Diagnosis not present

## 2021-12-02 DIAGNOSIS — R110 Nausea: Secondary | ICD-10-CM | POA: Insufficient documentation

## 2021-12-02 DIAGNOSIS — Z95828 Presence of other vascular implants and grafts: Secondary | ICD-10-CM

## 2021-12-02 DIAGNOSIS — Z170 Estrogen receptor positive status [ER+]: Secondary | ICD-10-CM

## 2021-12-02 DIAGNOSIS — Z79899 Other long term (current) drug therapy: Secondary | ICD-10-CM | POA: Diagnosis not present

## 2021-12-02 DIAGNOSIS — C50411 Malignant neoplasm of upper-outer quadrant of right female breast: Secondary | ICD-10-CM | POA: Insufficient documentation

## 2021-12-02 DIAGNOSIS — D72819 Decreased white blood cell count, unspecified: Secondary | ICD-10-CM | POA: Insufficient documentation

## 2021-12-02 DIAGNOSIS — Z5111 Encounter for antineoplastic chemotherapy: Secondary | ICD-10-CM | POA: Insufficient documentation

## 2021-12-02 LAB — CMP (CANCER CENTER ONLY)
ALT: 20 U/L (ref 0–44)
AST: 16 U/L (ref 15–41)
Albumin: 4.2 g/dL (ref 3.5–5.0)
Alkaline Phosphatase: 56 U/L (ref 38–126)
Anion gap: 6 (ref 5–15)
BUN: 9 mg/dL (ref 6–20)
CO2: 28 mmol/L (ref 22–32)
Calcium: 9.3 mg/dL (ref 8.9–10.3)
Chloride: 102 mmol/L (ref 98–111)
Creatinine: 0.57 mg/dL (ref 0.44–1.00)
GFR, Estimated: >60 mL/min (ref >60)
Glucose, Bld: 89 mg/dL (ref 70–99)
Potassium: 4 mmol/L (ref 3.5–5.1)
Sodium: 136 mmol/L (ref 135–145)
Total Bilirubin: 0.4 mg/dL (ref 0.3–1.2)
Total Protein: 6.9 g/dL (ref 6.5–8.1)

## 2021-12-02 LAB — CBC WITH DIFFERENTIAL (CANCER CENTER ONLY)
Abs Immature Granulocytes: 0.05 10*3/uL (ref 0.00–0.07)
Basophils Absolute: 0 10*3/uL (ref 0.0–0.1)
Basophils Relative: 1 %
Eosinophils Absolute: 0.1 10*3/uL (ref 0.0–0.5)
Eosinophils Relative: 2 %
HCT: 28.8 % — ABNORMAL LOW (ref 36.0–46.0)
Hemoglobin: 10 g/dL — ABNORMAL LOW (ref 12.0–15.0)
Immature Granulocytes: 1 %
Lymphocytes Relative: 26 %
Lymphs Abs: 1 10*3/uL (ref 0.7–4.0)
MCH: 32.8 pg (ref 26.0–34.0)
MCHC: 34.7 g/dL (ref 30.0–36.0)
MCV: 94.4 fL (ref 80.0–100.0)
Monocytes Absolute: 0.6 10*3/uL (ref 0.1–1.0)
Monocytes Relative: 15 %
Neutro Abs: 2.2 10*3/uL (ref 1.7–7.7)
Neutrophils Relative %: 55 %
Platelet Count: 158 10*3/uL (ref 150–400)
RBC: 3.05 MIL/uL — ABNORMAL LOW (ref 3.87–5.11)
RDW: 15.9 % — ABNORMAL HIGH (ref 11.5–15.5)
WBC Count: 3.9 10*3/uL — ABNORMAL LOW (ref 4.0–10.5)
nRBC: 0 % (ref 0.0–0.2)

## 2021-12-02 MED ORDER — FAMOTIDINE IN NACL 20-0.9 MG/50ML-% IV SOLN
20.0000 mg | Freq: Once | INTRAVENOUS | Status: AC
  Administered 2021-12-02: 20 mg via INTRAVENOUS
  Filled 2021-12-02: qty 50

## 2021-12-02 MED ORDER — SODIUM CHLORIDE 0.9% FLUSH
10.0000 mL | Freq: Once | INTRAVENOUS | Status: AC
  Administered 2021-12-02: 10 mL

## 2021-12-02 MED ORDER — SODIUM CHLORIDE 0.9 % IV SOLN: Freq: Once | INTRAVENOUS | Status: AC

## 2021-12-02 MED ORDER — DIPHENHYDRAMINE HCL 50 MG/ML IJ SOLN
12.5000 mg | Freq: Once | INTRAMUSCULAR | Status: AC
  Administered 2021-12-02: 12.5 mg via INTRAVENOUS
  Filled 2021-12-02: qty 1

## 2021-12-02 MED ORDER — SODIUM CHLORIDE 0.9 % IV SOLN
80.0000 mg/m2 | Freq: Once | INTRAVENOUS | Status: AC
  Administered 2021-12-02: 150 mg via INTRAVENOUS
  Filled 2021-12-02: qty 25

## 2021-12-02 MED ORDER — HEPARIN SOD (PORK) LOCK FLUSH 100 UNIT/ML IV SOLN
500.0000 [IU] | Freq: Once | INTRAVENOUS | Status: AC | PRN
  Administered 2021-12-02: 500 [IU]

## 2021-12-02 MED ORDER — SODIUM CHLORIDE 0.9% FLUSH
10.0000 mL | INTRAVENOUS | Status: DC | PRN
  Administered 2021-12-02: 10 mL

## 2021-12-02 MED ORDER — PALONOSETRON HCL INJECTION 0.25 MG/5ML
0.2500 mg | Freq: Once | INTRAVENOUS | Status: AC
  Administered 2021-12-02: 0.25 mg via INTRAVENOUS
  Filled 2021-12-02: qty 5

## 2021-12-02 MED ORDER — SODIUM CHLORIDE 0.9 % IV SOLN
10.0000 mg | Freq: Once | INTRAVENOUS | Status: AC
  Administered 2021-12-02: 10 mg via INTRAVENOUS
  Filled 2021-12-02: qty 10

## 2021-12-02 NOTE — Patient Instructions (Signed)
[facility]  Discharge Instructions: Thank you for choosing [facility] to provide your oncology and hematology care.   If you have a lab appointment with the Cancer Center, please go directly to the Cancer Center and check in at the registration area.   Wear comfortable clothing and clothing appropriate for easy access to any Portacath or PICC line.   We strive to give you quality time with your provider. You may need to reschedule your appointment if you arrive late (15 or more minutes).  Arriving late affects you and other patients whose appointments are after yours.  Also, if you miss three or more appointments without notifying the office, you may be dismissed from the clinic at the provider's discretion.      For prescription refill requests, have your pharmacy contact our office and allow 72 hours for refills to be completed.    Today you received the following chemotherapy and/or immunotherapy agent: Paclitaxel (Taxol)   To help prevent nausea and vomiting after your treatment, we encourage you to take your nausea medication as directed.  BELOW ARE SYMPTOMS THAT SHOULD BE REPORTED IMMEDIATELY: *FEVER GREATER THAN 100.4 F (38 C) OR HIGHER *CHILLS OR SWEATING *NAUSEA AND VOMITING THAT IS NOT CONTROLLED WITH YOUR NAUSEA MEDICATION *UNUSUAL SHORTNESS OF BREATH *UNUSUAL BRUISING OR BLEEDING *URINARY PROBLEMS (pain or burning when urinating, or frequent urination) *BOWEL PROBLEMS (unusual diarrhea, constipation, pain near the anus) TENDERNESS IN MOUTH AND THROAT WITH OR WITHOUT PRESENCE OF ULCERS (sore throat, sores in mouth, or a toothache) UNUSUAL RASH, SWELLING OR PAIN  UNUSUAL VAGINAL DISCHARGE OR ITCHING   Items with * indicate a potential emergency and should be followed up as soon as possible or go to the Emergency Department if any problems should occur.  Please show the CHEMOTHERAPY ALERT CARD or IMMUNOTHERAPY ALERT CARD at  check-in to the Emergency Department and triage nurse.  Should you have questions after your visit or need to cancel or reschedule your appointment, please contact [facility]  Dept: [phone]  and follow the prompts.  Office hours are 8:00 a.m. to 4:30 p.m. Monday - Friday. Please note that voicemails left after 4:00 p.m. may not be returned until the following business day.  We are closed weekends and major holidays. You have access to a nurse at all times for urgent questions. Please call the main number to the clinic Dept: [phone] and follow the prompts.   For any non-urgent questions, you may also contact your provider using MyChart. We now offer e-Visits for anyone 18 and older to request care online for non-urgent symptoms. For details visit [email or web address].   Also download the MyChart app! Go to the app store, search "MyChart", open the app, select [facility], and log in with your MyChart username and password.  Due to Covid, a mask is required upon entering the hospital/clinic. If you do not have a mask, one will be given to you upon arrival. For doctor visits, patients may have 1 support person aged 18 or older with them. For treatment visits, patients cannot have anyone with them due to current Covid guidelines and our immunocompromised population.  

## 2021-12-03 DIAGNOSIS — F419 Anxiety disorder, unspecified: Secondary | ICD-10-CM | POA: Diagnosis not present

## 2021-12-03 DIAGNOSIS — G479 Sleep disorder, unspecified: Secondary | ICD-10-CM | POA: Diagnosis not present

## 2021-12-03 DIAGNOSIS — C44501 Unspecified malignant neoplasm of skin of breast: Secondary | ICD-10-CM | POA: Diagnosis not present

## 2021-12-08 MED FILL — Fosaprepitant Dimeglumine For IV Infusion 150 MG (Base Eq): INTRAVENOUS | Qty: 5 | Status: AC

## 2021-12-08 MED FILL — Dexamethasone Sodium Phosphate Inj 100 MG/10ML: INTRAMUSCULAR | Qty: 1 | Status: AC

## 2021-12-08 NOTE — Progress Notes (Signed)
Patient Care Team: Crecencio Mc, MD as PCP - General (Internal Medicine) Mauro Kaufmann, RN as Oncology Nurse Navigator Rockwell Germany, RN as Oncology Nurse Navigator Jovita Kussmaul, MD as Consulting Physician (General Surgery) Nicholas Lose, MD as Consulting Physician (Hematology and Oncology) Eppie Gibson, MD as Attending Physician (Radiation Oncology)  DIAGNOSIS:    ICD-10-CM   1. Malignant neoplasm of upper-outer quadrant of right breast in female, estrogen receptor negative ([place])  C50.411    Z17.1       SUMMARY OF ONCOLOGIC HISTORY: Oncology History  Malignant neoplasm of upper-outer quadrant of right breast in female, estrogen receptor negative ([place])  07/08/2021 Initial Diagnosis   Palpable right breast lump. Diagnostic mammogram: showed highly suspicious right breast mass and an indeterminate single right axillary lymph node with up to 5 mm diffuse cortical thickening. Biopsy: grade 2-3 invasive ductal carcinoma, DCIS, and right axillary lymph node negative for metastatic carcinoma; ER+(80%)/PR-/Her2-.   07/16/2021 Cancer Staging   Staging form: Breast, AJCC 8th Edition - Clinical stage from 07/16/2021: Stage IIB (cT2, cN0, cM0, G3, ER+, PR-, HER2-) - Signed by Nicholas Lose, MD on 07/16/2021 Stage prefix: Initial diagnosis Histologic grading system: 3 grade system    08/18/2021 Surgery   Right mastectomy: Grade 3 IDC 2.8 cm with high-grade DCIS, margins negative, 0/3 lymph nodes negative, ER 0%, PR 0%, HER2 negative, Ki-67 60% (triple negative)   09/22/2021 -  Chemotherapy   Patient is on Treatment Plan : BREAST Dose Dense AC q14d / CARBOplatin D1 + PACLitaxel D1,8,15 q21d     10/06/2021 Genetic Testing   Negative hereditary cancer genetic testing: no pathogenic variants detected in [facility] CancerNext-Expanded +RNAinsight Panel.  The report date is 10/06/2021.   The CancerNext-Expanded gene panel offered by [facility] and includes sequencing, rearrangement, and  RNA analysis for the following 77 genes: AIP, ALK, APC, ATM, AXIN2, BAP1, BARD1, BLM, BMPR1A, BRCA1, BRCA2, BRIP1, CDC73, CDH1, CDK4, CDKN1B, CDKN2A, CHEK2, CTNNA1, DICER1, FANCC, FH, FLCN, GALNT12, KIF1B, LZTR1, MAX, MEN1, MET, MLH1, MSH2, MSH3, MSH6, MUTYH, NBN, NF1, NF2, NTHL1, PALB2, PHOX2B, PMS2, POT1, PRKAR1A, PTCH1, PTEN, RAD51C, RAD51D, RB1, RECQL, RET, SDHA, SDHAF2, SDHB, SDHC, SDHD, SMAD4, SMARCA4, SMARCB1, SMARCE1, STK11, SUFU, TMEM127, TP53, TSC1, TSC2, VHL and XRCC2 (sequencing and deletion/duplication); EGFR, EGLN1, HOXB13, KIT, MITF, PDGFRA, POLD1, and POLE (sequencing only); EPCAM and GREM1 (deletion/duplication only).      CHIEF COMPLIANT: Cycle 4 Taxol (carboplatin every 3 weeks)  INTERVAL HISTORY: Katelyn [facility] is a 46 y.o. with above-mentioned history of triple negative breast cancer who is currently on adjuvant treatment with taxol and carboplatin. She presents to the clinic today for treatment.  She experienced developed nausea after chemotherapy and Aloxi has completely prevented that.  She has been gaining some weight because of decreased physical activity and increased carbohydrate intake.  She is worried about.  ALLERGIES:  is allergic to benadryl [diphenhydramine], latex, and phenergan [promethazine hcl].  MEDICATIONS:  Current Outpatient Medications  Medication Sig Dispense Refill   acetaminophen (TYLENOL) 500 MG tablet Take 1,000 mg by mouth every 8 (eight) hours as needed for moderate pain.     ALPRAZolam (XANAX XR) 2 MG 24 hr tablet TAKE 1 TABLET BY MOUTH EVERYDAY AT BEDTIME 30 tablet 2   cetirizine (ZYRTEC) 10 MG tablet Take 10 mg by mouth at bedtime.     citalopram (CELEXA) 40 MG tablet Take 1 tablet (40 mg total) by mouth daily. 90 tablet 1   ergocalciferol (DRISDOL) 1.25 MG (50000  UT) capsule Take 1 capsule (50,000 Units total) by mouth once a week. 12 capsule 0   ibuprofen (ADVIL) 200 MG tablet Take 800 mg by mouth every 8 (eight) hours as needed for moderate  pain.     lidocaine-prilocaine (EMLA) cream Apply to affected area once 30 g 3   LORazepam (ATIVAN) 0.5 MG tablet Take 1 tablet (0.5 mg total) by mouth at bedtime as needed for sleep. 30 tablet 0   ondansetron (ZOFRAN) 8 MG tablet Take 1 tablet (8 mg total) by mouth 2 (two) times daily as needed. Start on the third day after chemotherapy. 30 tablet 1   prochlorperazine (COMPAZINE) 10 MG tablet Take 1 tablet (10 mg total) by mouth every 6 (six) hours as needed (Nausea or vomiting). 30 tablet 1   No current facility-administered medications for this visit.    PHYSICAL EXAMINATION: ECOG PERFORMANCE STATUS: 1 - Symptomatic but completely ambulatory  Vitals:   12/09/21 0932  BP: (!) 123/50  Pulse: 87  Resp: 18  Temp: (!) 97.2 F (36.2 C)  SpO2: 100%   Filed Weights   12/09/21 0932  Weight: 188 lb 12.8 oz (85.6 kg)    LABORATORY DATA:  I have reviewed the data as listed CMP Latest Ref Rng & Units 12/02/2021 11/25/2021 11/17/2021  Glucose 70 - 99 mg/dL 89 110(H) 98  BUN 6 - 20 mg/dL _0 Creatinine 0.44 - 1.00 mg/dL 0.57 0.61 0.57  Sodium 135 - 145 mmol/L 136 140 139  Potassium 3.5 - 5.1 mmol/L 4.0 3.8 4.0  Chloride 98 - 111 mmol/L 102 103 104  CO2 22 - 32 mmol/L _1 Calcium 8.9 - 10.3 mg/dL 9.3 9.5 8.9  Total Protein 6.5 - 8.1 g/dL 6.9 7.0 7.5  Total Bilirubin 0.3 - 1.2 mg/dL 0.4 0.3 0.3  Alkaline Phos 38 - 126 U/L 56 53 86  AST 15 - 41 U/L _2 ALT 0 - 44 U/L _3 Lab Results  Component Value Date   WBC 4.4 12/09/2021   HGB 10.3 (L) 12/09/2021   HCT 30.0 (L) 12/09/2021   MCV 96.2 12/09/2021   PLT 209 12/09/2021   NEUTROABS 2.8 12/09/2021    ASSESSMENT & PLAN:  Malignant neoplasm of upper-outer quadrant of right breast in female, estrogen receptor negative ([place]) 07/08/2021: Palpable right breast lump. Diagnostic mammogram: showed highly suspicious right breast mass and an indeterminate single right axillary lymph node with up to 5 mm diffuse cortical  thickening. Biopsy: grade 2-3 invasive ductal carcinoma, DCIS, and right axillary lymph node negative for metastatic carcinoma; ER+(80%)/PR-/Her2-.   On the final pathology her estrogen receptor is negative and therefore patient has triple negative breast cancer   Recommendations: 1. Right mastectomy: Grade 3 IDC 2.8 cm with high-grade DCIS, margins negative, 0/3 lymph nodes negative, ER 0%, PR 0%, HER2 negative, Ki-67 40% (final pathology is triple negative disease) 2. adjuvant chemotherapy with dose dense Adriamycin and Cytoxan followed by Taxol and carboplatin 3. Genetic testing -------------------------------------------------------------------------------------------------- Current treatment: Completed 4 cycles of dose dense Adriamycin and Cytoxan, today is cycle 4 Taxol with carboplatin (every 3 weeks)   Chemo Toxicities: 1. Chemo induced anemia: Today's hemoglobin is 10.3 2. leukopenia: Monitoring closely.  Today ANC is 2.8.  3.  Mild nausea resolved with Aloxi  Monitoring for toxicity especially peripheral neuropathy.   Return to clinic weekly for Taxol and every other week for follow-up with me.    No orders  of the defined types were placed in this encounter.  The patient has a good understanding of the overall plan. she agrees with it. she will call with any problems that may develop before the next visit here.  Total time spent: 30 mins including face to face time and time spent for planning, charting and coordination of care  Rulon Eisenmenger, MD, MPH 12/09/2021  I, Thana Ates, am acting as scribe for Dr. Nicholas Lose.  I have reviewed the above documentation for accuracy and completeness, and I agree with the above.

## 2021-12-09 ENCOUNTER — Inpatient Hospital Stay: Payer: BC Managed Care – PPO

## 2021-12-09 ENCOUNTER — Inpatient Hospital Stay (HOSPITAL_BASED_OUTPATIENT_CLINIC_OR_DEPARTMENT_OTHER): Payer: BC Managed Care – PPO | Admitting: Hematology and Oncology

## 2021-12-09 ENCOUNTER — Other Ambulatory Visit: Payer: Self-pay

## 2021-12-09 DIAGNOSIS — R110 Nausea: Secondary | ICD-10-CM | POA: Diagnosis not present

## 2021-12-09 DIAGNOSIS — Z171 Estrogen receptor negative status [ER-]: Secondary | ICD-10-CM | POA: Diagnosis not present

## 2021-12-09 DIAGNOSIS — Z79899 Other long term (current) drug therapy: Secondary | ICD-10-CM | POA: Diagnosis not present

## 2021-12-09 DIAGNOSIS — Z95828 Presence of other vascular implants and grafts: Secondary | ICD-10-CM

## 2021-12-09 DIAGNOSIS — Z9011 Acquired absence of right breast and nipple: Secondary | ICD-10-CM | POA: Diagnosis not present

## 2021-12-09 DIAGNOSIS — D6481 Anemia due to antineoplastic chemotherapy: Secondary | ICD-10-CM | POA: Diagnosis not present

## 2021-12-09 DIAGNOSIS — Z170 Estrogen receptor positive status [ER+]: Secondary | ICD-10-CM

## 2021-12-09 DIAGNOSIS — Z5111 Encounter for antineoplastic chemotherapy: Secondary | ICD-10-CM | POA: Diagnosis not present

## 2021-12-09 DIAGNOSIS — D72819 Decreased white blood cell count, unspecified: Secondary | ICD-10-CM | POA: Diagnosis not present

## 2021-12-09 DIAGNOSIS — Z9221 Personal history of antineoplastic chemotherapy: Secondary | ICD-10-CM | POA: Diagnosis not present

## 2021-12-09 DIAGNOSIS — C50411 Malignant neoplasm of upper-outer quadrant of right female breast: Secondary | ICD-10-CM | POA: Diagnosis not present

## 2021-12-09 LAB — CBC WITH DIFFERENTIAL (CANCER CENTER ONLY)
Abs Immature Granulocytes: 0.02 10*3/uL (ref 0.00–0.07)
Basophils Absolute: 0 10*3/uL (ref 0.0–0.1)
Basophils Relative: 1 %
Eosinophils Absolute: 0.1 10*3/uL (ref 0.0–0.5)
Eosinophils Relative: 3 %
HCT: 30 % — ABNORMAL LOW (ref 36.0–46.0)
Hemoglobin: 10.3 g/dL — ABNORMAL LOW (ref 12.0–15.0)
Immature Granulocytes: 1 %
Lymphocytes Relative: 22 %
Lymphs Abs: 1 10*3/uL (ref 0.7–4.0)
MCH: 33 pg (ref 26.0–34.0)
MCHC: 34.3 g/dL (ref 30.0–36.0)
MCV: 96.2 fL (ref 80.0–100.0)
Monocytes Absolute: 0.4 10*3/uL (ref 0.1–1.0)
Monocytes Relative: 10 %
Neutro Abs: 2.8 10*3/uL (ref 1.7–7.7)
Neutrophils Relative %: 63 %
Platelet Count: 209 10*3/uL (ref 150–400)
RBC: 3.12 MIL/uL — ABNORMAL LOW (ref 3.87–5.11)
RDW: 16.6 % — ABNORMAL HIGH (ref 11.5–15.5)
WBC Count: 4.4 10*3/uL (ref 4.0–10.5)
nRBC: 0 % (ref 0.0–0.2)

## 2021-12-09 LAB — CMP (CANCER CENTER ONLY)
ALT: 22 U/L (ref 0–44)
AST: 19 U/L (ref 15–41)
Albumin: 4.2 g/dL (ref 3.5–5.0)
Alkaline Phosphatase: 53 U/L (ref 38–126)
Anion gap: 6 (ref 5–15)
BUN: 8 mg/dL (ref 6–20)
CO2: 28 mmol/L (ref 22–32)
Calcium: 9.1 mg/dL (ref 8.9–10.3)
Chloride: 103 mmol/L (ref 98–111)
Creatinine: 0.57 mg/dL (ref 0.44–1.00)
GFR, Estimated: >60 mL/min (ref >60)
Glucose, Bld: 93 mg/dL (ref 70–99)
Potassium: 3.8 mmol/L (ref 3.5–5.1)
Sodium: 137 mmol/L (ref 135–145)
Total Bilirubin: 0.4 mg/dL (ref 0.3–1.2)
Total Protein: 6.9 g/dL (ref 6.5–8.1)

## 2021-12-09 MED ORDER — SODIUM CHLORIDE 0.9 % IV SOLN: Freq: Once | INTRAVENOUS | Status: AC

## 2021-12-09 MED ORDER — FAMOTIDINE IN NACL 20-0.9 MG/50ML-% IV SOLN
20.0000 mg | Freq: Once | INTRAVENOUS | Status: AC
  Administered 2021-12-09: 20 mg via INTRAVENOUS
  Filled 2021-12-09: qty 50

## 2021-12-09 MED ORDER — DIPHENHYDRAMINE HCL 50 MG/ML IJ SOLN
12.5000 mg | Freq: Once | INTRAMUSCULAR | Status: AC
  Administered 2021-12-09: 12.5 mg via INTRAVENOUS
  Filled 2021-12-09: qty 1

## 2021-12-09 MED ORDER — SODIUM CHLORIDE 0.9 % IV SOLN
80.0000 mg/m2 | Freq: Once | INTRAVENOUS | Status: AC
  Administered 2021-12-09: 150 mg via INTRAVENOUS
  Filled 2021-12-09: qty 25

## 2021-12-09 MED ORDER — SODIUM CHLORIDE 0.9% FLUSH: 10.0000 mL | Freq: Once | INTRAVENOUS | Status: DC

## 2021-12-09 MED ORDER — PALONOSETRON HCL INJECTION 0.25 MG/5ML
0.2500 mg | Freq: Once | INTRAVENOUS | Status: AC
  Administered 2021-12-09: 0.25 mg via INTRAVENOUS
  Filled 2021-12-09: qty 5

## 2021-12-09 MED ORDER — SODIUM CHLORIDE 0.9% FLUSH
10.0000 mL | INTRAVENOUS | Status: DC | PRN
  Administered 2021-12-09: 10 mL

## 2021-12-09 MED ORDER — HEPARIN SOD (PORK) LOCK FLUSH 100 UNIT/ML IV SOLN
500.0000 [IU] | Freq: Once | INTRAVENOUS | Status: AC | PRN
  Administered 2021-12-09: 500 [IU]

## 2021-12-09 MED ORDER — SODIUM CHLORIDE 0.9 % IV SOLN
150.0000 mg | Freq: Once | INTRAVENOUS | Status: AC
  Administered 2021-12-09: 150 mg via INTRAVENOUS
  Filled 2021-12-09: qty 150

## 2021-12-09 MED ORDER — SODIUM CHLORIDE 0.9 % IV SOLN
10.0000 mg | Freq: Once | INTRAVENOUS | Status: AC
  Administered 2021-12-09: 10 mg via INTRAVENOUS
  Filled 2021-12-09: qty 10

## 2021-12-09 MED ORDER — SODIUM CHLORIDE 0.9 % IV SOLN
700.0000 mg | Freq: Once | INTRAVENOUS | Status: AC
  Administered 2021-12-09: 700 mg via INTRAVENOUS
  Filled 2021-12-09: qty 70

## 2021-12-09 NOTE — Assessment & Plan Note (Signed)
07/08/2021:Palpable right breast lump. Diagnostic mammogram: showed highly suspicious right breast mass and an indeterminate single right axillary lymph node with up to 5 mm diffuse cortical thickening. Biopsy: grade 2-3 invasive ductal carcinoma, DCIS, and right axillary lymph node negative for metastatic carcinoma; ER+(80%)/PR-/Her2-.  On the final pathology her estrogen receptor is negative and therefore patient has triple negative breast cancer  Recommendations: 1.Right mastectomy: Grade 3 IDC 2.8 cm with high-grade DCIS, margins negative, 0/3 lymph nodes negative, ER 0%, PR 0%, HER2 negative, Ki-[id number]%(final pathology is triple negative disease) 2.adjuvant chemotherapy with dose dense Adriamycin and Cytoxan followed by Taxol and carboplatin 3. Genetic testing -------------------------------------------------------------------------------------------------- Current treatment:Completed 4 cycles ofdose dense Adriamycin and Cytoxan, today is cycle 4 Taxol with carboplatin (every 3 weeks)  Chemo Toxicities: 1. Chemo induced anemia: Today's hemoglobin is 9.8   2. leukopenia: Monitoring closely.  Today ANC is 2.1.  I discussed with her that we may have to reduce the dosage or give her Granix injections if necessary.  Return to clinic weekly for Taxol and every other week for follow-up with me.

## 2021-12-09 NOTE — Patient Instructions (Signed)
[facility]  Discharge Instructions: Thank you for choosing [facility] to provide your oncology and hematology care.   If you have a lab appointment with the [facility], please go directly to the [facility] and check in at the registration area.   Wear comfortable clothing and clothing appropriate for easy access to any Portacath or PICC line.   We strive to give you quality time with your provider. You may need to reschedule your appointment if you arrive late (15 or more minutes).  Arriving late affects you and other patients whose appointments are after yours.  Also, if you miss three or more appointments without notifying the office, you may be dismissed from the clinic at the providers discretion.      For prescription refill requests, have your pharmacy contact our office and allow 72 hours for refills to be completed.    Today you received the following chemotherapy and/or immunotherapy agents: Paclitaxol, Carboplatin.      To help prevent nausea and vomiting after your treatment, we encourage you to take your nausea medication as directed.  BELOW ARE SYMPTOMS THAT SHOULD BE REPORTED IMMEDIATELY: *FEVER GREATER THAN 100.4 F (38 C) OR HIGHER *CHILLS OR SWEATING *NAUSEA AND VOMITING THAT IS NOT CONTROLLED WITH YOUR NAUSEA MEDICATION *UNUSUAL SHORTNESS OF BREATH *UNUSUAL BRUISING OR BLEEDING *URINARY PROBLEMS (pain or burning when urinating, or frequent urination) *BOWEL PROBLEMS (unusual diarrhea, constipation, pain near the anus) TENDERNESS IN MOUTH AND THROAT WITH OR WITHOUT PRESENCE OF ULCERS (sore throat, sores in mouth, or a toothache) UNUSUAL RASH, SWELLING OR PAIN  UNUSUAL VAGINAL DISCHARGE OR ITCHING   Items with * indicate a potential emergency and should be followed up as soon as possible or go to the Emergency Department if any problems should occur.  Please show the CHEMOTHERAPY ALERT CARD or IMMUNOTHERAPY ALERT CARD  at check-in to the Emergency Department and triage nurse.  Should you have questions after your visit or need to cancel or reschedule your appointment, please contact [facility]  Dept: [phone]  and follow the prompts.  Office hours are 8:00 a.m. to 4:30 p.m. Monday - Friday. Please note that voicemails left after 4:00 p.m. may not be returned until the following business day.  We are closed weekends and major holidays. You have access to a nurse at all times for urgent questions. Please call the main number to the clinic Dept: [phone] and follow the prompts.   For any non-urgent questions, you may also contact your provider using MyChart. We now offer e-Visits for anyone 15 and older to request care online for non-urgent symptoms. For details visit [email or web address].   Also download the MyChart app! Go to the app store, search "MyChart", open the app, select [facility], and log in with your MyChart username and password.  Due to Covid, a mask is required upon entering the hospital/clinic. If you do not have a mask, one will be given to you upon arrival. For doctor visits, patients may have 1 support person aged 66 or older with them. For treatment visits, patients cannot have anyone with them due to current Covid guidelines and our immunocompromised population.

## 2021-12-10 ENCOUNTER — Other Ambulatory Visit: Payer: Self-pay | Admitting: *Deleted

## 2021-12-10 MED ORDER — AMOXICILLIN 500 MG PO CAPS: 500.0000 mg | ORAL_CAPSULE | Freq: Two times a day (BID) | ORAL | 0 refills | Status: DC

## 2021-12-10 MED ORDER — AMOXICILLIN 500 MG PO CAPS: 500.0000 mg | ORAL_CAPSULE | Freq: Three times a day (TID) | ORAL | 0 refills | Status: DC

## 2021-12-10 MED ORDER — MAGIC MOUTHWASH W/LIDOCAINE: 5.0000 mL | Freq: Three times a day (TID) | ORAL | 1 refills | Status: DC | PRN

## 2021-12-10 NOTE — Progress Notes (Signed)
Received call from pt with complaint of sore throat and red raised bumps on the roof of her mouth x2 days.  Pt states her spouse was diagnosed today with strep throat.  Per MD pt needing to be prescribed amoxicillin 500 mg BID x1 week as well as magic mouth wash.  Pt educated and verbalized understanding. Prescription sent to pharmacy on file.

## 2021-12-11 ENCOUNTER — Ambulatory Visit: Payer: Self-pay | Admitting: General Surgery

## 2021-12-11 DIAGNOSIS — Z170 Estrogen receptor positive status [ER+]: Secondary | ICD-10-CM | POA: Diagnosis not present

## 2021-12-11 DIAGNOSIS — C50411 Malignant neoplasm of upper-outer quadrant of right female breast: Secondary | ICD-10-CM | POA: Diagnosis not present

## 2021-12-11 DIAGNOSIS — C50911 Malignant neoplasm of unspecified site of right female breast: Secondary | ICD-10-CM | POA: Diagnosis not present

## 2021-12-11 DIAGNOSIS — Z9012 Acquired absence of left breast and nipple: Secondary | ICD-10-CM | POA: Diagnosis not present

## 2021-12-12 DIAGNOSIS — Z9012 Acquired absence of left breast and nipple: Secondary | ICD-10-CM | POA: Diagnosis not present

## 2021-12-12 DIAGNOSIS — C50911 Malignant neoplasm of unspecified site of right female breast: Secondary | ICD-10-CM | POA: Diagnosis not present

## 2021-12-15 ENCOUNTER — Encounter: Payer: Self-pay | Admitting: Hematology and Oncology

## 2021-12-15 ENCOUNTER — Other Ambulatory Visit: Payer: Self-pay | Admitting: Hematology and Oncology

## 2021-12-15 DIAGNOSIS — Z170 Estrogen receptor positive status [ER+]: Secondary | ICD-10-CM

## 2021-12-15 DIAGNOSIS — C50411 Malignant neoplasm of upper-outer quadrant of right female breast: Secondary | ICD-10-CM

## 2021-12-15 MED FILL — Dexamethasone Sodium Phosphate Inj 100 MG/10ML: INTRAMUSCULAR | Qty: 1 | Status: AC

## 2021-12-16 ENCOUNTER — Other Ambulatory Visit: Payer: Self-pay

## 2021-12-16 ENCOUNTER — Inpatient Hospital Stay (HOSPITAL_BASED_OUTPATIENT_CLINIC_OR_DEPARTMENT_OTHER): Payer: BC Managed Care – PPO

## 2021-12-16 ENCOUNTER — Encounter: Payer: Self-pay | Admitting: Hematology and Oncology

## 2021-12-16 ENCOUNTER — Inpatient Hospital Stay: Payer: BC Managed Care – PPO

## 2021-12-16 VITALS — BP 103/54 | HR 82 | Temp 98.3°F | Resp 18 | Wt 188.2 lb

## 2021-12-16 DIAGNOSIS — Z79899 Other long term (current) drug therapy: Secondary | ICD-10-CM | POA: Diagnosis not present

## 2021-12-16 DIAGNOSIS — D72819 Decreased white blood cell count, unspecified: Secondary | ICD-10-CM | POA: Diagnosis not present

## 2021-12-16 DIAGNOSIS — Z171 Estrogen receptor negative status [ER-]: Secondary | ICD-10-CM

## 2021-12-16 DIAGNOSIS — C50411 Malignant neoplasm of upper-outer quadrant of right female breast: Secondary | ICD-10-CM

## 2021-12-16 DIAGNOSIS — Z170 Estrogen receptor positive status [ER+]: Secondary | ICD-10-CM

## 2021-12-16 DIAGNOSIS — Z9221 Personal history of antineoplastic chemotherapy: Secondary | ICD-10-CM | POA: Diagnosis not present

## 2021-12-16 DIAGNOSIS — Z95828 Presence of other vascular implants and grafts: Secondary | ICD-10-CM

## 2021-12-16 DIAGNOSIS — D6481 Anemia due to antineoplastic chemotherapy: Secondary | ICD-10-CM | POA: Diagnosis not present

## 2021-12-16 DIAGNOSIS — Z9011 Acquired absence of right breast and nipple: Secondary | ICD-10-CM | POA: Diagnosis not present

## 2021-12-16 DIAGNOSIS — R110 Nausea: Secondary | ICD-10-CM | POA: Diagnosis not present

## 2021-12-16 DIAGNOSIS — Z5111 Encounter for antineoplastic chemotherapy: Secondary | ICD-10-CM | POA: Diagnosis not present

## 2021-12-16 LAB — CMP (CANCER CENTER ONLY)
ALT: 23 U/L (ref 0–44)
AST: 17 U/L (ref 15–41)
Albumin: 4.5 g/dL (ref 3.5–5.0)
Alkaline Phosphatase: 57 U/L (ref 38–126)
Anion gap: 4 — ABNORMAL LOW (ref 5–15)
BUN: 7 mg/dL (ref 6–20)
CO2: 32 mmol/L (ref 22–32)
Calcium: 9.3 mg/dL (ref 8.9–10.3)
Chloride: 102 mmol/L (ref 98–111)
Creatinine: 0.59 mg/dL (ref 0.44–1.00)
GFR, Estimated: >60 mL/min (ref >60)
Glucose, Bld: 96 mg/dL (ref 70–99)
Potassium: 4 mmol/L (ref 3.5–5.1)
Sodium: 138 mmol/L (ref 135–145)
Total Bilirubin: 0.3 mg/dL (ref 0.3–1.2)
Total Protein: 7.3 g/dL (ref 6.5–8.1)

## 2021-12-16 LAB — CBC WITH DIFFERENTIAL (CANCER CENTER ONLY)
Abs Immature Granulocytes: 0.02 10*3/uL (ref 0.00–0.07)
Basophils Absolute: 0 10*3/uL (ref 0.0–0.1)
Basophils Relative: 1 %
Eosinophils Absolute: 0.1 10*3/uL (ref 0.0–0.5)
Eosinophils Relative: 2 %
HCT: 30.2 % — ABNORMAL LOW (ref 36.0–46.0)
Hemoglobin: 10.6 g/dL — ABNORMAL LOW (ref 12.0–15.0)
Immature Granulocytes: 1 %
Lymphocytes Relative: 25 %
Lymphs Abs: 1 10*3/uL (ref 0.7–4.0)
MCH: 32.9 pg (ref 26.0–34.0)
MCHC: 35.1 g/dL (ref 30.0–36.0)
MCV: 93.8 fL (ref 80.0–100.0)
Monocytes Absolute: 0.4 10*3/uL (ref 0.1–1.0)
Monocytes Relative: 11 %
Neutro Abs: 2.4 10*3/uL (ref 1.7–7.7)
Neutrophils Relative %: 60 %
Platelet Count: 183 10*3/uL (ref 150–400)
RBC: 3.22 MIL/uL — ABNORMAL LOW (ref 3.87–5.11)
RDW: 15.2 % (ref 11.5–15.5)
WBC Count: 3.9 10*3/uL — ABNORMAL LOW (ref 4.0–10.5)
nRBC: 0 % (ref 0.0–0.2)

## 2021-12-16 MED ORDER — SODIUM CHLORIDE 0.9% FLUSH
10.0000 mL | Freq: Once | INTRAVENOUS | Status: AC
  Administered 2021-12-16: 10 mL

## 2021-12-16 MED ORDER — FAMOTIDINE IN NACL 20-0.9 MG/50ML-% IV SOLN
20.0000 mg | Freq: Once | INTRAVENOUS | Status: AC
  Administered 2021-12-16: 20 mg via INTRAVENOUS
  Filled 2021-12-16: qty 50

## 2021-12-16 MED ORDER — SODIUM CHLORIDE 0.9 % IV SOLN: Freq: Once | INTRAVENOUS | Status: AC

## 2021-12-16 MED ORDER — SODIUM CHLORIDE 0.9 % IV SOLN
10.0000 mg | Freq: Once | INTRAVENOUS | Status: AC
  Administered 2021-12-16: 10 mg via INTRAVENOUS
  Filled 2021-12-16: qty 10

## 2021-12-16 MED ORDER — DIPHENHYDRAMINE HCL 50 MG/ML IJ SOLN
12.5000 mg | Freq: Once | INTRAMUSCULAR | Status: AC
  Administered 2021-12-16: 12.5 mg via INTRAVENOUS
  Filled 2021-12-16: qty 1

## 2021-12-16 MED ORDER — HEPARIN SOD (PORK) LOCK FLUSH 100 UNIT/ML IV SOLN
500.0000 [IU] | Freq: Once | INTRAVENOUS | Status: AC | PRN
  Administered 2021-12-16: 500 [IU]

## 2021-12-16 MED ORDER — SODIUM CHLORIDE 0.9 % IV SOLN
80.0000 mg/m2 | Freq: Once | INTRAVENOUS | Status: AC
  Administered 2021-12-16: 150 mg via INTRAVENOUS
  Filled 2021-12-16: qty 25

## 2021-12-16 MED ORDER — SODIUM CHLORIDE 0.9% FLUSH
10.0000 mL | INTRAVENOUS | Status: DC | PRN
  Administered 2021-12-16: 10 mL

## 2021-12-16 MED ORDER — PALONOSETRON HCL INJECTION 0.25 MG/5ML
0.2500 mg | Freq: Once | INTRAVENOUS | Status: AC
  Administered 2021-12-16: 0.25 mg via INTRAVENOUS
  Filled 2021-12-16: qty 5

## 2021-12-16 NOTE — Patient Instructions (Signed)
[facility]   Discharge Instructions: Thank you for choosing [facility] to provide your oncology and hematology care.   If you have a lab appointment with the Cancer Center, please go directly to the Cancer Center and check in at the registration area.   Wear comfortable clothing and clothing appropriate for easy access to any Portacath or PICC line.   We strive to give you quality time with your provider. You may need to reschedule your appointment if you arrive late (15 or more minutes).  Arriving late affects you and other patients whose appointments are after yours.  Also, if you miss three or more appointments without notifying the office, you may be dismissed from the clinic at the provider's discretion.      For prescription refill requests, have your pharmacy contact our office and allow 72 hours for refills to be completed.    Today you received the following chemotherapy and/or immunotherapy agents: paclitaxel.      To help prevent nausea and vomiting after your treatment, we encourage you to take your nausea medication as directed.  BELOW ARE SYMPTOMS THAT SHOULD BE REPORTED IMMEDIATELY: *FEVER GREATER THAN 100.4 F (38 C) OR HIGHER *CHILLS OR SWEATING *NAUSEA AND VOMITING THAT IS NOT CONTROLLED WITH YOUR NAUSEA MEDICATION *UNUSUAL SHORTNESS OF BREATH *UNUSUAL BRUISING OR BLEEDING *URINARY PROBLEMS (pain or burning when urinating, or frequent urination) *BOWEL PROBLEMS (unusual diarrhea, constipation, pain near the anus) TENDERNESS IN MOUTH AND THROAT WITH OR WITHOUT PRESENCE OF ULCERS (sore throat, sores in mouth, or a toothache) UNUSUAL RASH, SWELLING OR PAIN  UNUSUAL VAGINAL DISCHARGE OR ITCHING   Items with * indicate a potential emergency and should be followed up as soon as possible or go to the Emergency Department if any problems should occur.  Please show the CHEMOTHERAPY ALERT CARD or IMMUNOTHERAPY ALERT CARD at check-in  to the Emergency Department and triage nurse.  Should you have questions after your visit or need to cancel or reschedule your appointment, please contact [facility]  Dept: [phone]  and follow the prompts.  Office hours are 8:00 a.m. to 4:30 p.m. Monday - Friday. Please note that voicemails left after 4:00 p.m. may not be returned until the following business day.  We are closed weekends and major holidays. You have access to a nurse at all times for urgent questions. Please call the main number to the clinic Dept: [phone] and follow the prompts.   For any non-urgent questions, you may also contact your provider using MyChart. We now offer e-Visits for anyone 18 and older to request care online for non-urgent symptoms. For details visit [email or web address].   Also download the MyChart app! Go to the app store, search "MyChart", open the app, select [facility], and log in with your MyChart username and password.  Due to Covid, a mask is required upon entering the hospital/clinic. If you do not have a mask, one will be given to you upon arrival. For doctor visits, patients may have 1 support person aged 18 or older with them. For treatment visits, patients cannot have anyone with them due to current Covid guidelines and our immunocompromised population.   

## 2021-12-22 MED FILL — Dexamethasone Sodium Phosphate Inj 100 MG/10ML: INTRAMUSCULAR | Qty: 1 | Status: AC

## 2021-12-22 NOTE — Progress Notes (Signed)
Patient Care Team: Crecencio Mc, MD as PCP - General (Internal Medicine) Mauro Kaufmann, RN as Oncology Nurse Navigator Rockwell Germany, RN as Oncology Nurse Navigator Jovita Kussmaul, MD as Consulting Physician (General Surgery) Nicholas Lose, MD as Consulting Physician (Hematology and Oncology) Eppie Gibson, MD as Attending Physician (Radiation Oncology)  DIAGNOSIS:    ICD-10-CM   1. Malignant neoplasm of upper-outer quadrant of right breast in female, estrogen receptor negative ([place])  C50.411    Z17.1       SUMMARY OF ONCOLOGIC HISTORY: Oncology History  Malignant neoplasm of upper-outer quadrant of right breast in female, estrogen receptor negative ([place])  07/08/2021 Initial Diagnosis   Palpable right breast lump. Diagnostic mammogram: showed highly suspicious right breast mass and an indeterminate single right axillary lymph node with up to 5 mm diffuse cortical thickening. Biopsy: grade 2-3 invasive ductal carcinoma, DCIS, and right axillary lymph node negative for metastatic carcinoma; ER+(80%)/PR-/Her2-.   07/16/2021 Cancer Staging   Staging form: Breast, AJCC 8th Edition - Clinical stage from 07/16/2021: Stage IIB (cT2, cN0, cM0, G3, ER+, PR-, HER2-) - Signed by Nicholas Lose, MD on 07/16/2021 Stage prefix: Initial diagnosis Histologic grading system: 3 grade system    08/18/2021 Surgery   Right mastectomy: Grade 3 IDC 2.8 cm with high-grade DCIS, margins negative, 0/3 lymph nodes negative, ER 0%, PR 0%, HER2 negative, Ki-67 60% (triple negative)   09/22/2021 -  Chemotherapy   Patient is on Treatment Plan : BREAST Dose Dense AC q14d / CARBOplatin D1 + PACLitaxel D1,8,15 q21d     10/06/2021 Genetic Testing   Negative hereditary cancer genetic testing: no pathogenic variants detected in [facility] CancerNext-Expanded +RNAinsight Panel.  The report date is 10/06/2021.   The CancerNext-Expanded gene panel offered by [facility] and includes sequencing, rearrangement, and  RNA analysis for the following 77 genes: AIP, ALK, APC, ATM, AXIN2, BAP1, BARD1, BLM, BMPR1A, BRCA1, BRCA2, BRIP1, CDC73, CDH1, CDK4, CDKN1B, CDKN2A, CHEK2, CTNNA1, DICER1, FANCC, FH, FLCN, GALNT12, KIF1B, LZTR1, MAX, MEN1, MET, MLH1, MSH2, MSH3, MSH6, MUTYH, NBN, NF1, NF2, NTHL1, PALB2, PHOX2B, PMS2, POT1, PRKAR1A, PTCH1, PTEN, RAD51C, RAD51D, RB1, RECQL, RET, SDHA, SDHAF2, SDHB, SDHC, SDHD, SMAD4, SMARCA4, SMARCB1, SMARCE1, STK11, SUFU, TMEM127, TP53, TSC1, TSC2, VHL and XRCC2 (sequencing and deletion/duplication); EGFR, EGLN1, HOXB13, KIT, MITF, PDGFRA, POLD1, and POLE (sequencing only); EPCAM and GREM1 (deletion/duplication only).      CHIEF COMPLIANT: Cycle 6 Taxol  INTERVAL HISTORY: Katelyn Hobbs is a 46 y.o. with above-mentioned history of triple negative breast cancer who is currently on adjuvant treatment with taxol and carboplatin. She presents to the clinic today for treatment.  Other than fatigue from going through treatment she is holding up fairly well.  She is tired of coming back and forth every week for treatments.  Denies any peripheral neuropathy.  Denies any other new symptoms or concerns.  ALLERGIES:  is allergic to benadryl [diphenhydramine], latex, and phenergan [promethazine hcl].  MEDICATIONS:  Current Outpatient Medications  Medication Sig Dispense Refill   acetaminophen (TYLENOL) 500 MG tablet Take 1,000 mg by mouth every 8 (eight) hours as needed for moderate pain.     ALPRAZolam (XANAX XR) 2 MG 24 hr tablet TAKE 1 TABLET BY MOUTH EVERYDAY AT BEDTIME 30 tablet 2   amoxicillin (AMOXIL) 500 MG capsule Take 1 capsule (500 mg total) by mouth 2 (two) times daily. 14 capsule 0   cetirizine (ZYRTEC) 10 MG tablet Take 10 mg by mouth at bedtime.  citalopram (CELEXA) 40 MG tablet Take 1 tablet (40 mg total) by mouth daily. 90 tablet 1   ergocalciferol (DRISDOL) 1.25 MG (50000 UT) capsule Take 1 capsule (50,000 Units total) by mouth once a week. 12 capsule 0   ibuprofen  (ADVIL) 200 MG tablet Take 800 mg by mouth every 8 (eight) hours as needed for moderate pain.     lidocaine-prilocaine (EMLA) cream Apply to affected area once 30 g 3   LORazepam (ATIVAN) 0.5 MG tablet Take 1 tablet (0.5 mg total) by mouth at bedtime as needed for sleep. 30 tablet 0   magic mouthwash w/lidocaine SOLN Take 5 mLs by mouth 3 (three) times daily as needed for mouth pain. 240 mL 1   ondansetron (ZOFRAN) 8 MG tablet TAKE 1 TABLET BY MOUTH 2 (TWO) TIMES DAILY AS NEEDED. START ON THE THIRD DAY AFTER CHEMOTHERAPY. 30 tablet 1   prochlorperazine (COMPAZINE) 10 MG tablet TAKE 1 TABLET (10 MG TOTAL) BY MOUTH EVERY 6 (SIX) HOURS AS NEEDED (NAUSEA OR VOMITING). 30 tablet 1   No current facility-administered medications for this visit.    PHYSICAL EXAMINATION: ECOG PERFORMANCE STATUS: 1 - Symptomatic but completely ambulatory  Vitals:   12/23/21 0819  BP: 112/67  Pulse: 93  Resp: 16  Temp: 97.6 F (36.4 C)  SpO2: 99%   Filed Weights   12/23/21 0819  Weight: 191 lb 12.8 oz (87 kg)      LABORATORY DATA:  I have reviewed the data as listed CMP Latest Ref Rng & Units 12/16/2021 12/09/2021 12/02/2021  Glucose 70 - 99 mg/dL 96 93 89  BUN 6 - 20 mg/dL _0 Creatinine 0.44 - 1.00 mg/dL 0.59 0.57 0.57  Sodium 135 - 145 mmol/L 138 137 136  Potassium 3.5 - 5.1 mmol/L 4.0 3.8 4.0  Chloride 98 - 111 mmol/L 102 103 102  CO2 22 - 32 mmol/L 32 28 28  Calcium 8.9 - 10.3 mg/dL 9.3 9.1 9.3  Total Protein 6.5 - 8.1 g/dL 7.3 6.9 6.9  Total Bilirubin 0.3 - 1.2 mg/dL 0.3 0.4 0.4  Alkaline Phos 38 - 126 U/L 57 53 56  AST 15 - 41 U/L _1 ALT 0 - 44 U/L _2 Lab Results  Component Value Date   WBC 3.6 (L) 12/23/2021   HGB 10.4 (L) 12/23/2021   HCT 30.0 (L) 12/23/2021   MCV 96.8 12/23/2021   PLT 218 12/23/2021   NEUTROABS 2.0 12/23/2021    ASSESSMENT & PLAN:  Malignant neoplasm of upper-outer quadrant of right breast in female, estrogen receptor negative ([place]) 07/08/2021:  Palpable right breast lump. Diagnostic mammogram: showed highly suspicious right breast mass and an indeterminate single right axillary lymph node with up to 5 mm diffuse cortical thickening. Biopsy: grade 2-3 invasive ductal carcinoma, DCIS, and right axillary lymph node negative for metastatic carcinoma; ER+(80%)/PR-/Her2-.   On the final pathology her estrogen receptor is negative and therefore patient has triple negative breast cancer   Recommendations: 1. Right mastectomy: Grade 3 IDC 2.8 cm with high-grade DCIS, margins negative, 0/3 lymph nodes negative, ER 0%, PR 0%, HER2 negative, Ki-67 40% (final pathology is triple negative disease) 2. adjuvant chemotherapy with dose dense Adriamycin and Cytoxan followed by Taxol and carboplatin 3. Genetic testing -------------------------------------------------------------------------------------------------- Current treatment: Completed 4 cycles of dose dense Adriamycin and Cytoxan, today is cycle 6 Taxol with carboplatin (every 3 weeks)   Chemo Toxicities: 1. Chemo induced anemia: Today's hemoglobin is 10.4  2. leukopenia: Monitoring closely.  Today ANC is 2 3.  Mild nausea resolved with Aloxi   Monitoring for toxicity especially peripheral neuropathy. Once chemotherapy is finished we discussed about obtaining CT chest abdomen pelvis, bone scan and a brain MRI in June 2023   Return to clinic weekly for Taxol and every other week for follow-up with me.    No orders of the defined types were placed in this encounter.  The patient has a good understanding of the overall plan. she agrees with it. she will call with any problems that may develop before the next visit here.  Total time spent: 30 mins including face to face time and time spent for planning, charting and coordination of care  Rulon Eisenmenger, MD, MPH 12/23/2021  I, Thana Ates, am acting as scribe for Dr. Nicholas Lose.  I have reviewed the above documentation for accuracy and  completeness, and I agree with the above.

## 2021-12-23 ENCOUNTER — Inpatient Hospital Stay: Payer: BC Managed Care – PPO

## 2021-12-23 ENCOUNTER — Inpatient Hospital Stay (HOSPITAL_BASED_OUTPATIENT_CLINIC_OR_DEPARTMENT_OTHER): Payer: BC Managed Care – PPO | Admitting: Hematology and Oncology

## 2021-12-23 ENCOUNTER — Other Ambulatory Visit: Payer: Self-pay | Admitting: *Deleted

## 2021-12-23 ENCOUNTER — Other Ambulatory Visit: Payer: Self-pay

## 2021-12-23 DIAGNOSIS — R110 Nausea: Secondary | ICD-10-CM | POA: Diagnosis not present

## 2021-12-23 DIAGNOSIS — Z171 Estrogen receptor negative status [ER-]: Secondary | ICD-10-CM

## 2021-12-23 DIAGNOSIS — D72819 Decreased white blood cell count, unspecified: Secondary | ICD-10-CM | POA: Diagnosis not present

## 2021-12-23 DIAGNOSIS — C50411 Malignant neoplasm of upper-outer quadrant of right female breast: Secondary | ICD-10-CM | POA: Diagnosis not present

## 2021-12-23 DIAGNOSIS — Z9221 Personal history of antineoplastic chemotherapy: Secondary | ICD-10-CM | POA: Diagnosis not present

## 2021-12-23 DIAGNOSIS — D6481 Anemia due to antineoplastic chemotherapy: Secondary | ICD-10-CM | POA: Diagnosis not present

## 2021-12-23 DIAGNOSIS — Z95828 Presence of other vascular implants and grafts: Secondary | ICD-10-CM

## 2021-12-23 DIAGNOSIS — Z79899 Other long term (current) drug therapy: Secondary | ICD-10-CM | POA: Diagnosis not present

## 2021-12-23 DIAGNOSIS — Z9011 Acquired absence of right breast and nipple: Secondary | ICD-10-CM | POA: Diagnosis not present

## 2021-12-23 DIAGNOSIS — Z170 Estrogen receptor positive status [ER+]: Secondary | ICD-10-CM

## 2021-12-23 DIAGNOSIS — Z5111 Encounter for antineoplastic chemotherapy: Secondary | ICD-10-CM | POA: Diagnosis not present

## 2021-12-23 LAB — CBC WITH DIFFERENTIAL (CANCER CENTER ONLY)
Abs Immature Granulocytes: 0.03 10*3/uL (ref 0.00–0.07)
Basophils Absolute: 0 10*3/uL (ref 0.0–0.1)
Basophils Relative: 1 %
Eosinophils Absolute: 0.1 10*3/uL (ref 0.0–0.5)
Eosinophils Relative: 2 %
HCT: 30 % — ABNORMAL LOW (ref 36.0–46.0)
Hemoglobin: 10.4 g/dL — ABNORMAL LOW (ref 12.0–15.0)
Immature Granulocytes: 1 %
Lymphocytes Relative: 26 %
Lymphs Abs: 0.9 10*3/uL (ref 0.7–4.0)
MCH: 33.5 pg (ref 26.0–34.0)
MCHC: 34.7 g/dL (ref 30.0–36.0)
MCV: 96.8 fL (ref 80.0–100.0)
Monocytes Absolute: 0.5 10*3/uL (ref 0.1–1.0)
Monocytes Relative: 14 %
Neutro Abs: 2 10*3/uL (ref 1.7–7.7)
Neutrophils Relative %: 56 %
Platelet Count: 218 10*3/uL (ref 150–400)
RBC: 3.1 MIL/uL — ABNORMAL LOW (ref 3.87–5.11)
RDW: 15.6 % — ABNORMAL HIGH (ref 11.5–15.5)
WBC Count: 3.6 10*3/uL — ABNORMAL LOW (ref 4.0–10.5)
nRBC: 0 % (ref 0.0–0.2)

## 2021-12-23 LAB — CMP (CANCER CENTER ONLY)
ALT: 21 U/L (ref 0–44)
AST: 17 U/L (ref 15–41)
Albumin: 4.2 g/dL (ref 3.5–5.0)
Alkaline Phosphatase: 54 U/L (ref 38–126)
Anion gap: 6 (ref 5–15)
BUN: 7 mg/dL (ref 6–20)
CO2: 28 mmol/L (ref 22–32)
Calcium: 8.9 mg/dL (ref 8.9–10.3)
Chloride: 102 mmol/L (ref 98–111)
Creatinine: 0.6 mg/dL (ref 0.44–1.00)
GFR, Estimated: >60 mL/min (ref >60)
Glucose, Bld: 99 mg/dL (ref 70–99)
Potassium: 3.8 mmol/L (ref 3.5–5.1)
Sodium: 136 mmol/L (ref 135–145)
Total Bilirubin: 0.4 mg/dL (ref 0.3–1.2)
Total Protein: 7 g/dL (ref 6.5–8.1)

## 2021-12-23 MED ORDER — FAMOTIDINE IN NACL 20-0.9 MG/50ML-% IV SOLN
20.0000 mg | Freq: Once | INTRAVENOUS | Status: AC
  Administered 2021-12-23: 20 mg via INTRAVENOUS

## 2021-12-23 MED ORDER — SODIUM CHLORIDE 0.9% FLUSH
10.0000 mL | Freq: Once | INTRAVENOUS | Status: AC
  Administered 2021-12-23: 10 mL

## 2021-12-23 MED ORDER — DIPHENHYDRAMINE HCL 50 MG/ML IJ SOLN
12.5000 mg | Freq: Once | INTRAMUSCULAR | Status: AC
  Administered 2021-12-23: 12.5 mg via INTRAVENOUS
  Filled 2021-12-23: qty 1

## 2021-12-23 MED ORDER — PALONOSETRON HCL INJECTION 0.25 MG/5ML
0.2500 mg | Freq: Once | INTRAVENOUS | Status: AC
  Administered 2021-12-23: 0.25 mg via INTRAVENOUS
  Filled 2021-12-23: qty 5

## 2021-12-23 MED ORDER — SODIUM CHLORIDE 0.9 % IV SOLN: Freq: Once | INTRAVENOUS | Status: AC

## 2021-12-23 MED ORDER — HEPARIN SOD (PORK) LOCK FLUSH 100 UNIT/ML IV SOLN
500.0000 [IU] | Freq: Once | INTRAVENOUS | Status: AC | PRN
  Administered 2021-12-23: 500 [IU]

## 2021-12-23 MED ORDER — SODIUM CHLORIDE 0.9 % IV SOLN
10.0000 mg | Freq: Once | INTRAVENOUS | Status: AC
  Administered 2021-12-23: 10 mg via INTRAVENOUS
  Filled 2021-12-23: qty 10

## 2021-12-23 MED ORDER — SODIUM CHLORIDE 0.9 % IV SOLN
80.0000 mg/m2 | Freq: Once | INTRAVENOUS | Status: AC
  Administered 2021-12-23: 150 mg via INTRAVENOUS
  Filled 2021-12-23: qty 25

## 2021-12-23 MED ORDER — SODIUM CHLORIDE 0.9% FLUSH
10.0000 mL | INTRAVENOUS | Status: DC | PRN
  Administered 2021-12-23: 10 mL

## 2021-12-23 NOTE — Assessment & Plan Note (Signed)
07/08/2021:Palpable right breast lump. Diagnostic mammogram: showed highly suspicious right breast mass and an indeterminate single right axillary lymph node with up to 5 mm diffuse cortical thickening. Biopsy: grade 2-3 invasive ductal carcinoma, DCIS, and right axillary lymph node negative for metastatic carcinoma; ER+(80%)/PR-/Her2-.  On the final pathology her estrogen receptor is negative and therefore patient has triple negative breast cancer  Recommendations: 1.Right mastectomy: Grade 3 IDC 2.8 cm with high-grade DCIS, margins negative, 0/3 lymph nodes negative, ER 0%, PR 0%, HER2 negative, Ki-6740%(final pathology is triple negative disease) 2.adjuvant chemotherapy with dose dense Adriamycin and Cytoxan followed by Taxol and carboplatin 3. Genetic testing -------------------------------------------------------------------------------------------------- Current treatment:Completed 4 cycles ofdose dense Adriamycin and Cytoxan, todayiscycle6Taxol with carboplatin (every 3 weeks)  Chemo Toxicities: 1.Chemo induced anemia: Today's hemoglobin is10.3 2.leukopenia: Monitoring closely. Today ANC is 2.8.  3.  Mild nausea resolved with Aloxi  Monitoring for toxicity especially peripheral neuropathy.  Return to clinicweekly for Taxol and every other week for follow-up with me.

## 2021-12-23 NOTE — Patient Instructions (Signed)
[facility]   Discharge Instructions: Thank you for choosing [facility] to provide your oncology and hematology care.   If you have a lab appointment with the Cancer Center, please go directly to the Cancer Center and check in at the registration area.   Wear comfortable clothing and clothing appropriate for easy access to any Portacath or PICC line.   We strive to give you quality time with your provider. You may need to reschedule your appointment if you arrive late (15 or more minutes).  Arriving late affects you and other patients whose appointments are after yours.  Also, if you miss three or more appointments without notifying the office, you may be dismissed from the clinic at the provider's discretion.      For prescription refill requests, have your pharmacy contact our office and allow 72 hours for refills to be completed.    Today you received the following chemotherapy and/or immunotherapy agents: paclitaxel.      To help prevent nausea and vomiting after your treatment, we encourage you to take your nausea medication as directed.  BELOW ARE SYMPTOMS THAT SHOULD BE REPORTED IMMEDIATELY: *FEVER GREATER THAN 100.4 F (38 C) OR HIGHER *CHILLS OR SWEATING *NAUSEA AND VOMITING THAT IS NOT CONTROLLED WITH YOUR NAUSEA MEDICATION *UNUSUAL SHORTNESS OF BREATH *UNUSUAL BRUISING OR BLEEDING *URINARY PROBLEMS (pain or burning when urinating, or frequent urination) *BOWEL PROBLEMS (unusual diarrhea, constipation, pain near the anus) TENDERNESS IN MOUTH AND THROAT WITH OR WITHOUT PRESENCE OF ULCERS (sore throat, sores in mouth, or a toothache) UNUSUAL RASH, SWELLING OR PAIN  UNUSUAL VAGINAL DISCHARGE OR ITCHING   Items with * indicate a potential emergency and should be followed up as soon as possible or go to the Emergency Department if any problems should occur.  Please show the CHEMOTHERAPY ALERT CARD or IMMUNOTHERAPY ALERT CARD at check-in  to the Emergency Department and triage nurse.  Should you have questions after your visit or need to cancel or reschedule your appointment, please contact [facility]  Dept: [phone]  and follow the prompts.  Office hours are 8:00 a.m. to 4:30 p.m. Monday - Friday. Please note that voicemails left after 4:00 p.m. may not be returned until the following business day.  We are closed weekends and major holidays. You have access to a nurse at all times for urgent questions. Please call the main number to the clinic Dept: [phone] and follow the prompts.   For any non-urgent questions, you may also contact your provider using MyChart. We now offer e-Visits for anyone 18 and older to request care online for non-urgent symptoms. For details visit [email or web address].   Also download the MyChart app! Go to the app store, search "MyChart", open the app, select [facility], and log in with your MyChart username and password.  Due to Covid, a mask is required upon entering the hospital/clinic. If you do not have a mask, one will be given to you upon arrival. For doctor visits, patients may have 1 support Edwing Figley aged 47 or older with them. For treatment visits, patients cannot have anyone with them due to current Covid guidelines and our immunocompromised population.   

## 2021-12-24 ENCOUNTER — Ambulatory Visit: Payer: BC Managed Care – PPO | Attending: Hematology and Oncology | Admitting: Rehabilitation

## 2021-12-24 ENCOUNTER — Encounter: Payer: Self-pay | Admitting: Rehabilitation

## 2021-12-24 DIAGNOSIS — Z171 Estrogen receptor negative status [ER-]: Secondary | ICD-10-CM | POA: Diagnosis not present

## 2021-12-24 DIAGNOSIS — R293 Abnormal posture: Secondary | ICD-10-CM | POA: Insufficient documentation

## 2021-12-24 DIAGNOSIS — R600 Localized edema: Secondary | ICD-10-CM | POA: Diagnosis not present

## 2021-12-24 DIAGNOSIS — M25612 Stiffness of left shoulder, not elsewhere classified: Secondary | ICD-10-CM | POA: Insufficient documentation

## 2021-12-24 DIAGNOSIS — C50411 Malignant neoplasm of upper-outer quadrant of right female breast: Secondary | ICD-10-CM | POA: Insufficient documentation

## 2021-12-24 DIAGNOSIS — Z170 Estrogen receptor positive status [ER+]: Secondary | ICD-10-CM | POA: Insufficient documentation

## 2021-12-24 DIAGNOSIS — Z483 Aftercare following surgery for neoplasm: Secondary | ICD-10-CM | POA: Insufficient documentation

## 2021-12-24 DIAGNOSIS — M25611 Stiffness of right shoulder, not elsewhere classified: Secondary | ICD-10-CM | POA: Diagnosis not present

## 2021-12-24 NOTE — Patient Instructions (Signed)

## 2021-12-24 NOTE — Therapy (Signed)
[name] @ [address] [place], [place], [address] Phone: [phone]   Fax:  [phone]  Physical Therapy Evaluation  Patient Details  Name: Katelyn Hobbs MRN: [id number] Date of Birth: 10-Apr-1976 Referring Provider (PT): Dr. Lindi Adie   Encounter Date: 12/24/2021   PT End of Session - 12/24/21 0902     Visit Number 3    Number of Visits 5    Date for PT Re-Evaluation 02/18/22    PT Start Time 0803    PT Stop Time 0853    PT Time Calculation (min) 50 min    Activity Tolerance Patient tolerated treatment well    Behavior During Therapy [facility] for tasks assessed/performed             Past Medical History:  Diagnosis Date   Abnormal uterine bleeding    ADHD (attention deficit hyperactivity disorder)    Anemia    Anxiety    Bacterial vaginosis    Cancer ([place]) 06/2021   breast cancer   Complication of anesthesia    Depression    Endometriosis    Family history of breast cancer 07/17/2021   Family history of melanoma 07/17/2021   Family history of prostate cancer 07/17/2021   Family history of uterine cancer 07/17/2021   Hyperlipidemia    Migraine    PCOS (polycystic ovarian syndrome)    PONV (postoperative nausea and vomiting)     Past Surgical History:  Procedure Laterality Date   IR IMAGING GUIDED PORT INSERTION  09/09/2021   LAPAROSCOPY  1993   MASTECTOMY W/ SENTINEL NODE BIOPSY Right 08/18/2021   Procedure: RIGHT MASTECTOMY WITH RIGHT SENTINEL LYMPH NODE BIOPSY;  Surgeon: Jovita Kussmaul, MD;  Location: [place];  Service: General;  Laterality: Right;   SALPINGECTOMY Bilateral 2015   TOTAL MASTECTOMY Left 08/18/2021   Procedure: LEFT TOTAL MASTECTOMY;  Surgeon: Jovita Kussmaul, MD;  Location: [place];  Service: General;  Laterality: Left;   VAGINAL HYSTERECTOMY  2017    There were no vitals filed for this visit.    Subjective Assessment - 12/24/21 0803     Subjective I noticed some cording.  Pulling  in the medial forearm last week.  It may be swelling.    Pertinent History Rt mastectomy 08/18/21 due to Grade 3 triple negative breast cancer.  0/3LN negative.  Currently on carboplatin/paclitaxel.    Currently in Pain? No/denies                [facility] PT Assessment - 12/24/21 0001       Assessment   Medical Diagnosis Right breast cancer    Referring Provider (PT) Dr. Lindi Adie    Onset Date/Surgical Date 07/01/21    Hand Dominance Right    Prior Therapy none      Precautions   Precaution Comments lymphedema risk Rt UE      Balance Screen   Has the patient fallen in the past 6 months No    Has the patient had a decrease in activity level because of a fear of falling?  No    Is the patient reluctant to leave their home because of a fear of falling?  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  No cording visible with AROM      AROM   Right Shoulder Flexion 170 Degrees   pull in arm   Right Shoulder ABduction 170 Degrees   axilla pull   Right Shoulder External Rotation 90 Degrees    Right Shoulder Horizontal ABduction 50 Degrees      Palpation   Palpation comment cording hard to palpate but noticed in the axilla, upper arm, and elbow crease with overhead flexion end range.  1-2 small thin cords                L-DEX FLOWSHEETS - 12/24/21 0800       L-DEX LYMPHEDEMA SCREENING   Measurement Type Unilateral    L-DEX MEASUREMENT  EXTREMITY Upper Extremity    POSITION  Standing    DOMINANT SIDE Right    At Risk Side Right    BASELINE SCORE (UNILATERAL) 1.1    L-DEX SCORE (UNILATERAL) 6.4    VALUE CHANGE (UNILAT) 5.3    Comment gave pt tg soft size small for home use and discussed repeating sozo in 1 month just to stay on top of increase                    Objective measurements completed on examination: See above findings.       [facility] Adult PT Treatment/Exercise - 12/24/21 0001       Self-Care   Self-Care Other Self-Care Comments    Other Self-Care Comments  reviewed current stretches, anatomy and physiology of cording. Pt is current doing excellent stretches and only childs post on counter was added.      Manual Therapy   Manual Therapy Myofascial release;Soft tissue mobilization    Soft tissue mobilization Along cording path with cocoa butter at end range PROM flexion and D2 flexion    Myofascial Release to Rt upper arm from axilla to wrist following cording using S technique and flat hand opposing pull.                     PT Education - 12/24/21 0859     Education Details see self care section    Person(s) Educated Patient    Methods Explanation;Demonstration    Comprehension Verbalized understanding;Returned demonstration              PT Short Term Goals - 12/24/21 0901       PT SHORT TERM GOAL #1   Title Pt will be educated on axillary web syndrome, POC, and self stretches    Time 1    Period Days    Status Achieved               PT Long Term Goals - 12/24/21 0901       PT LONG TERM GOAL #1   Title Patient will demonstrate she has regained full shoulder ROM and function post operatively compared to baselines.    Status Achieved      PT LONG TERM GOAL #2   Title Pt will be ind with final HEP for cording    Time 8    Period Weeks  Status New                    Plan - 12/24/21 7846     Clinical Impression Statement Pt returns with  new sensation of cording that has greatly improved since dex infusion yesterday with chemotherapy.  1-2 small thin cords are noted but no loss of AROM.  Pt is very knowledgeable about stretches and these were reviewed along with self MFR to the arm.  Pt lives far from the clinic so she will return in 4 weeks to recheck SOZO and reassess cording.    Personal Factors and Comorbidities Comorbidity 2    Comorbidities chemotherapy, SLNB    Stability/Clinical Decision Making Stable/Uncomplicated    Clinical Decision Making Low    Rehab Potential Excellent    PT Frequency Monthy    PT Duration 8 weeks    PT Treatment/Interventions ADLs/Self Care Home Management;Therapeutic exercise;Patient/family education;Manual lymph drainage;Manual techniques;Taping    PT Next Visit Plan recheck SOZO due to increase, how is cording? any needs? continue SOZO 3 month screens    Consulted and Agree with Plan of Care Patient             Patient will benefit from skilled therapeutic intervention in order to improve the following deficits and impairments:  Increased fascial restricitons  Visit Diagnosis: Aftercare following surgery for neoplasm  Malignant neoplasm of upper-outer quadrant of right breast in female, estrogen receptor positive ([place])  Abnormal posture     Problem List Patient Active Problem List   Diagnosis Date Noted   Port-A-Cath in place 10/21/2021   Cancer of right female breast ([place]) 08/18/2021   Family history of uterine cancer 07/17/2021   Family history of breast cancer 07/17/2021   Family history of prostate cancer 07/17/2021   Family history of melanoma 07/17/2021   Genetic testing 07/17/2021   Malignant neoplasm of upper-outer quadrant of right breast in female, estrogen receptor negative ([place]) 07/14/2021   Otitis media 06/11/2021   Mass of breast, right 06/11/2021   Breast calcifications on mammogram 03/16/2021   Encounter for preventive health examination 01/08/2021    Primary insomnia 09/24/2020   PCOS (polycystic ovarian syndrome) 04/16/2020   Paroxysmal SVT (supraventricular tachycardia) ([place]) 04/16/2020   Screen for STD (sexually transmitted disease) 04/16/2020   S/P total hysterectomy 04/16/2020   GAD (generalized anxiety disorder) 11/11/2019   Vitamin D deficiency 09/21/2015   Elevated glucose level 09/21/2015   Obesity (BMI 30.0-34.9) 09/17/2015    Stark Bray, PT 12/24/2021, 9:03 AM  [facility] @ [place] [place], [place], [address] Phone: [phone]   Fax:  [phone]  Name: Katelyn Hobbs MRN: [id number] Date of Birth: 10-09-1976

## 2021-12-29 MED FILL — Dexamethasone Sodium Phosphate Inj 100 MG/10ML: INTRAMUSCULAR | Qty: 1 | Status: AC

## 2021-12-29 MED FILL — Fosaprepitant Dimeglumine For IV Infusion 150 MG (Base Eq): INTRAVENOUS | Qty: 5 | Status: AC

## 2021-12-30 ENCOUNTER — Inpatient Hospital Stay: Payer: BC Managed Care – PPO | Attending: Hematology and Oncology

## 2021-12-30 ENCOUNTER — Inpatient Hospital Stay: Payer: BC Managed Care – PPO

## 2021-12-30 ENCOUNTER — Other Ambulatory Visit: Payer: Self-pay

## 2021-12-30 VITALS — BP 100/85 | HR 99 | Temp 98.9°F | Resp 16 | Wt 192.0 lb

## 2021-12-30 DIAGNOSIS — Z9011 Acquired absence of right breast and nipple: Secondary | ICD-10-CM | POA: Insufficient documentation

## 2021-12-30 DIAGNOSIS — R110 Nausea: Secondary | ICD-10-CM | POA: Insufficient documentation

## 2021-12-30 DIAGNOSIS — D72819 Decreased white blood cell count, unspecified: Secondary | ICD-10-CM | POA: Insufficient documentation

## 2021-12-30 DIAGNOSIS — Z171 Estrogen receptor negative status [ER-]: Secondary | ICD-10-CM | POA: Diagnosis not present

## 2021-12-30 DIAGNOSIS — Z95828 Presence of other vascular implants and grafts: Secondary | ICD-10-CM

## 2021-12-30 DIAGNOSIS — Z5111 Encounter for antineoplastic chemotherapy: Secondary | ICD-10-CM | POA: Insufficient documentation

## 2021-12-30 DIAGNOSIS — T451X5A Adverse effect of antineoplastic and immunosuppressive drugs, initial encounter: Secondary | ICD-10-CM | POA: Insufficient documentation

## 2021-12-30 DIAGNOSIS — D6481 Anemia due to antineoplastic chemotherapy: Secondary | ICD-10-CM | POA: Diagnosis not present

## 2021-12-30 DIAGNOSIS — G44209 Tension-type headache, unspecified, not intractable: Secondary | ICD-10-CM | POA: Diagnosis not present

## 2021-12-30 DIAGNOSIS — Z79899 Other long term (current) drug therapy: Secondary | ICD-10-CM | POA: Insufficient documentation

## 2021-12-30 DIAGNOSIS — C50411 Malignant neoplasm of upper-outer quadrant of right female breast: Secondary | ICD-10-CM | POA: Diagnosis not present

## 2021-12-30 DIAGNOSIS — Z170 Estrogen receptor positive status [ER+]: Secondary | ICD-10-CM

## 2021-12-30 LAB — CMP (CANCER CENTER ONLY)
ALT: 22 U/L (ref 0–44)
AST: 19 U/L (ref 15–41)
Albumin: 4.4 g/dL (ref 3.5–5.0)
Alkaline Phosphatase: 52 U/L (ref 38–126)
Anion gap: 5 (ref 5–15)
BUN: 8 mg/dL (ref 6–20)
CO2: 30 mmol/L (ref 22–32)
Calcium: 9.6 mg/dL (ref 8.9–10.3)
Chloride: 104 mmol/L (ref 98–111)
Creatinine: 0.58 mg/dL (ref 0.44–1.00)
GFR, Estimated: >60 mL/min (ref >60)
Glucose, Bld: 86 mg/dL (ref 70–99)
Potassium: 4.1 mmol/L (ref 3.5–5.1)
Sodium: 139 mmol/L (ref 135–145)
Total Bilirubin: 0.4 mg/dL (ref 0.3–1.2)
Total Protein: 7.4 g/dL (ref 6.5–8.1)

## 2021-12-30 LAB — CBC WITH DIFFERENTIAL (CANCER CENTER ONLY)
Abs Immature Granulocytes: 0.02 10*3/uL (ref 0.00–0.07)
Basophils Absolute: 0.1 10*3/uL (ref 0.0–0.1)
Basophils Relative: 1 %
Eosinophils Absolute: 0.1 10*3/uL (ref 0.0–0.5)
Eosinophils Relative: 2 %
HCT: 31 % — ABNORMAL LOW (ref 36.0–46.0)
Hemoglobin: 10.7 g/dL — ABNORMAL LOW (ref 12.0–15.0)
Immature Granulocytes: 0 %
Lymphocytes Relative: 27 %
Lymphs Abs: 1.2 10*3/uL (ref 0.7–4.0)
MCH: 33.6 pg (ref 26.0–34.0)
MCHC: 34.5 g/dL (ref 30.0–36.0)
MCV: 97.5 fL (ref 80.0–100.0)
Monocytes Absolute: 0.4 10*3/uL (ref 0.1–1.0)
Monocytes Relative: 9 %
Neutro Abs: 2.8 10*3/uL (ref 1.7–7.7)
Neutrophils Relative %: 61 %
Platelet Count: 188 10*3/uL (ref 150–400)
RBC: 3.18 MIL/uL — ABNORMAL LOW (ref 3.87–5.11)
RDW: 15.4 % (ref 11.5–15.5)
WBC Count: 4.5 10*3/uL (ref 4.0–10.5)
nRBC: 0 % (ref 0.0–0.2)

## 2021-12-30 MED ORDER — FAMOTIDINE IN NACL 20-0.9 MG/50ML-% IV SOLN
20.0000 mg | Freq: Once | INTRAVENOUS | Status: AC
  Administered 2021-12-30: 20 mg via INTRAVENOUS
  Filled 2021-12-30: qty 50

## 2021-12-30 MED ORDER — SODIUM CHLORIDE 0.9 % IV SOLN
700.0000 mg | Freq: Once | INTRAVENOUS | Status: AC
  Administered 2021-12-30: 700 mg via INTRAVENOUS
  Filled 2021-12-30: qty 70

## 2021-12-30 MED ORDER — SODIUM CHLORIDE 0.9 % IV SOLN
150.0000 mg | Freq: Once | INTRAVENOUS | Status: AC
  Administered 2021-12-30: 150 mg via INTRAVENOUS
  Filled 2021-12-30: qty 150

## 2021-12-30 MED ORDER — DIPHENHYDRAMINE HCL 50 MG/ML IJ SOLN
12.5000 mg | Freq: Once | INTRAMUSCULAR | Status: AC
  Administered 2021-12-30: 12.5 mg via INTRAVENOUS
  Filled 2021-12-30: qty 1

## 2021-12-30 MED ORDER — SODIUM CHLORIDE 0.9 % IV SOLN
10.0000 mg | Freq: Once | INTRAVENOUS | Status: AC
  Administered 2021-12-30: 10 mg via INTRAVENOUS
  Filled 2021-12-30: qty 10

## 2021-12-30 MED ORDER — SODIUM CHLORIDE 0.9 % IV SOLN: Freq: Once | INTRAVENOUS | Status: AC

## 2021-12-30 MED ORDER — SODIUM CHLORIDE 0.9% FLUSH
10.0000 mL | Freq: Once | INTRAVENOUS | Status: AC
  Administered 2021-12-30: 10 mL

## 2021-12-30 MED ORDER — SODIUM CHLORIDE 0.9 % IV SOLN
80.0000 mg/m2 | Freq: Once | INTRAVENOUS | Status: AC
  Administered 2021-12-30: 150 mg via INTRAVENOUS
  Filled 2021-12-30: qty 25

## 2021-12-30 MED ORDER — SODIUM CHLORIDE 0.9% FLUSH
10.0000 mL | INTRAVENOUS | Status: DC | PRN
  Administered 2021-12-30: 10 mL

## 2021-12-30 MED ORDER — PALONOSETRON HCL INJECTION 0.25 MG/5ML
0.2500 mg | Freq: Once | INTRAVENOUS | Status: AC
  Administered 2021-12-30: 0.25 mg via INTRAVENOUS
  Filled 2021-12-30: qty 5

## 2021-12-30 MED ORDER — HEPARIN SOD (PORK) LOCK FLUSH 100 UNIT/ML IV SOLN
500.0000 [IU] | Freq: Once | INTRAVENOUS | Status: AC | PRN
  Administered 2021-12-30: 500 [IU]

## 2021-12-30 NOTE — Patient Instructions (Signed)
[facility]ICAL [facility]COLOG[facility]   Discharge Instructions: Thank you for choosing [facility] to provide your oncology and hematology care.   If you have a lab appointment with the Cancer Center, please go directly to the Cancer Center and check in at the registration area.   Wear comfortable clothing and clothing appropriate for easy access to any Portacath or PICC line.   We strive to give you quality time with your provider. You may need to reschedule your appointment if you arrive late (15 or more minutes).  Arriving late affects you and other patients whose appointments are after yours.  Also, if you miss three or more appointments without notifying the office, you may be dismissed from the clinic at the provider's discretion.      For prescription refill requests, have your pharmacy contact our office and allow 72 hours for refills to be completed.    Today you received the following chemotherapy and/or immunotherapy agents: paclitaxel.      To help prevent nausea and vomiting after your treatment, we encourage you to take your nausea medication as directed.  BELOW ARE SYMPTOMS THAT SHOULD BE REPORTED IMMEDIATELY: *FEVER GREATER THAN 100.4 F (38 C) OR HIGHER *CHILLS OR SWEATING *NAUSEA AND VOMITING THAT IS NOT CONTROLLED WITH YOUR NAUSEA MEDICATION *UNUSUAL SHORTNESS OF BREATH *UNUSUAL BRUISING OR BLEEDING *URINARY PROBLEMS (pain or burning when urinating, or frequent urination) *BOWEL PROBLEMS (unusual diarrhea, constipation, pain near the anus) TENDERNESS IN MOUTH AND THROAT WITH OR WITHOUT PRESENCE OF ULCERS (sore throat, sores in mouth, or a toothache) UNUSUAL RASH, SWELLING OR PAIN  UNUSUAL VAGINAL DISCHARGE OR ITCHING   Items with * indicate a potential emergency and should be followed up as soon as possible or go to the Emergency Department if any problems should occur.  Please show the CHEMOTHERAPY ALERT CARD or IMMUNOTHERAPY ALERT CARD at check-in  to the Emergency Department and triage nurse.  Should you have questions after your visit or need to cancel or reschedule your appointment, please contact [facility]  Dept: [phone]  and follow the prompts.  Office hours are 8:00 a.m. to 4:30 p.m. Monday - Friday. Please note that voicemails left after 4:00 p.m. may not be returned until the following business day.  We are closed weekends and major holidays. You have access to a nurse at all times for urgent questions. Please call the main number to the clinic Dept: [phone] and follow the prompts.   For any non-urgent questions, you may also contact your provider using MyChart. We now offer e-Visits for anyone 18 and older to request care online for non-urgent symptoms. For details visit [email or web address].   Also download the MyChart app! Go to the app store, search "MyChart", open the app, select [facility], and log in with your MyChart username and password.  Due to Covid, a mask is required upon entering the hospital/clinic. If you do not have a mask, one will be given to you upon arrival. For doctor visits, patients may have 1 support person aged 18 or older with them. For treatment visits, patients cannot have anyone with them due to current Covid guidelines and our immunocompromised population.   

## 2021-12-31 DIAGNOSIS — F419 Anxiety disorder, unspecified: Secondary | ICD-10-CM | POA: Diagnosis not present

## 2021-12-31 DIAGNOSIS — C44501 Unspecified malignant neoplasm of skin of breast: Secondary | ICD-10-CM | POA: Diagnosis not present

## 2021-12-31 DIAGNOSIS — G479 Sleep disorder, unspecified: Secondary | ICD-10-CM | POA: Diagnosis not present

## 2022-01-01 ENCOUNTER — Other Ambulatory Visit: Payer: Self-pay | Admitting: Hematology and Oncology

## 2022-01-01 DIAGNOSIS — Z170 Estrogen receptor positive status [ER+]: Secondary | ICD-10-CM

## 2022-01-02 NOTE — Progress Notes (Incomplete)
Patient Care Team: Crecencio Mc, MD as PCP - General (Internal Medicine) Mauro Kaufmann, RN as Oncology Nurse Navigator Rockwell Germany, RN as Oncology Nurse Navigator Jovita Kussmaul, MD as Consulting Physician (General Surgery) Nicholas Lose, MD as Consulting Physician (Hematology and Oncology) Eppie Gibson, MD as Attending Physician (Radiation Oncology)  DIAGNOSIS: No diagnosis found.  SUMMARY OF ONCOLOGIC HISTORY: Oncology History  Malignant neoplasm of upper-outer quadrant of right breast in female, estrogen receptor negative ([place])  07/08/2021 Initial Diagnosis   Palpable right breast lump. Diagnostic mammogram: showed highly suspicious right breast mass and an indeterminate single right axillary lymph node with up to 5 mm diffuse cortical thickening. Biopsy: grade 2-3 invasive ductal carcinoma, DCIS, and right axillary lymph node negative for metastatic carcinoma; ER+(80%)/PR-/Her2-.   07/16/2021 Cancer Staging   Staging form: Breast, AJCC 8th Edition - Clinical stage from 07/16/2021: Stage IIB (cT2, cN0, cM0, G3, ER+, PR-, HER2-) - Signed by Nicholas Lose, MD on 07/16/2021 Stage prefix: Initial diagnosis Histologic grading system: 3 grade system    08/18/2021 Surgery   Right mastectomy: Grade 3 IDC 2.8 cm with high-grade DCIS, margins negative, 0/3 lymph nodes negative, ER 0%, PR 0%, HER2 negative, Ki-67 60% (triple negative)   09/22/2021 -  Chemotherapy   Patient is on Treatment Plan : BREAST Dose Dense AC q14d / CARBOplatin D1 + PACLitaxel D1,8,15 q21d     10/06/2021 Genetic Testing   Negative hereditary cancer genetic testing: no pathogenic variants detected in [facility] CancerNext-Expanded +RNAinsight Panel.  The report date is 10/06/2021.   The CancerNext-Expanded gene panel offered by [facility] and includes sequencing, rearrangement, and RNA analysis for the following 77 genes: AIP, ALK, APC, ATM, AXIN2, BAP1, BARD1, BLM, BMPR1A, BRCA1, BRCA2, BRIP1, CDC73, CDH1,  CDK4, CDKN1B, CDKN2A, CHEK2, CTNNA1, DICER1, FANCC, FH, FLCN, GALNT12, KIF1B, LZTR1, MAX, MEN1, MET, MLH1, MSH2, MSH3, MSH6, MUTYH, NBN, NF1, NF2, NTHL1, PALB2, PHOX2B, PMS2, POT1, PRKAR1A, PTCH1, PTEN, RAD51C, RAD51D, RB1, RECQL, RET, SDHA, SDHAF2, SDHB, SDHC, SDHD, SMAD4, SMARCA4, SMARCB1, SMARCE1, STK11, SUFU, TMEM127, TP53, TSC1, TSC2, VHL and XRCC2 (sequencing and deletion/duplication); EGFR, EGLN1, HOXB13, KIT, MITF, PDGFRA, POLD1, and POLE (sequencing only); EPCAM and GREM1 (deletion/duplication only).      CHIEF COMPLIANT: Cycle 4 Taxol (carboplatin every 3 weeks)      INTERVAL HISTORY: Katelyn Hobbs is a y.o. with above-mentioned history of triple negative breast cancer who is currently on adjuvant treatment with taxol and carboplatin.  ALLERGIES:  is allergic to benadryl [diphenhydramine], latex, and phenergan [promethazine hcl].  MEDICATIONS:  Current Outpatient Medications  Medication Sig Dispense Refill   acetaminophen (TYLENOL) 500 MG tablet Take 1,000 mg by mouth every 8 (eight) hours as needed for moderate pain.     ALPRAZolam (XANAX XR) 2 MG 24 hr tablet TAKE 1 TABLET BY MOUTH EVERYDAY AT BEDTIME 30 tablet 2   amoxicillin (AMOXIL) 500 MG capsule Take 1 capsule (500 mg total) by mouth 2 (two) times daily. 14 capsule 0   cetirizine (ZYRTEC) 10 MG tablet Take 10 mg by mouth at bedtime.     citalopram (CELEXA) 40 MG tablet Take 1 tablet (40 mg total) by mouth daily. 90 tablet 1   ergocalciferol (DRISDOL) 1.25 MG (50000 UT) capsule Take 1 capsule (50,000 Units total) by mouth once a week. 12 capsule 0   ibuprofen (ADVIL) 200 MG tablet Take 800 mg by mouth every 8 (eight) hours as needed for moderate pain.     lidocaine-prilocaine (EMLA) cream Apply to  affected area once 30 g 3   LORazepam (ATIVAN) 0.5 MG tablet TAKE 1 TABLET (0.5 MG TOTAL) BY MOUTH AT BEDTIME AS NEEDED FOR SLEEP. 30 tablet 0   magic mouthwash w/lidocaine SOLN Take 5 mLs by mouth 3 (three) times daily as  needed for mouth pain. 240 mL 1   ondansetron (ZOFRAN) 8 MG tablet TAKE 1 TABLET BY MOUTH 2 (TWO) TIMES DAILY AS NEEDED. START ON THE THIRD DAY AFTER CHEMOTHERAPY. 30 tablet 1   prochlorperazine (COMPAZINE) 10 MG tablet TAKE 1 TABLET (10 MG TOTAL) BY MOUTH EVERY 6 (SIX) HOURS AS NEEDED (NAUSEA OR VOMITING). 30 tablet 1   No current facility-administered medications for this visit.    PHYSICAL EXAMINATION: ECOG PERFORMANCE STATUS: {CHL ONC ECOG PS:[phone]}  There were no vitals filed for this visit. There were no vitals filed for this visit.  BREAST:*** No palpable masses or nodules in either right or left breasts. No palpable axillary supraclavicular or infraclavicular adenopathy no breast tenderness or nipple discharge. (exam performed in the presence of a chaperone)  LABORATORY DATA:  I have reviewed the data as listed CMP Latest Ref Rng & Units 12/30/2021 12/23/2021 12/16/2021  Glucose 70 - 99 mg/dL 86 99 96  BUN 6 - 20 mg/dL '8 7 7  ' Creatinine 0.44 - 1.00 mg/dL 0.58 0.60 0.59  Sodium 135 - 145 mmol/L 139 136 138  Potassium 3.5 - 5.1 mmol/L 4.1 3.8 4.0  Chloride 98 - 111 mmol/L 104 102 102  CO2 22 - 32 mmol/L 30 28 32  Calcium 8.9 - 10.3 mg/dL 9.6 8.9 9.3  Total Protein 6.5 - 8.1 g/dL 7.4 7.0 7.3  Total Bilirubin 0.3 - 1.2 mg/dL 0.4 0.4 0.3  Alkaline Phos 38 - 126 U/L 52 54 57  AST 15 - 41 U/L '19 17 17  ' ALT 0 - 44 U/L '22 21 23    ' Lab Results  Component Value Date   WBC 4.5 12/30/2021   HGB 10.7 (L) 12/30/2021   HCT 31.0 (L) 12/30/2021   MCV 97.5 12/30/2021   PLT 188 12/30/2021   NEUTROABS 2.8 12/30/2021    ASSESSMENT & PLAN:  No problem-specific Assessment & Plan notes found for this encounter.    No orders of the defined types were placed in this encounter.  The patient has a good understanding of the overall plan. she agrees with it. she will call with any problems that may develop before the next visit here. Total time spent: 30 mins including face to face time  and time spent for planning, charting and co-ordination of care   Suzzette Righter, [place] 01/02/22    I, Gardiner Coins, am acting as a Education administrator for Dr. Lindi Adie

## 2022-01-06 ENCOUNTER — Inpatient Hospital Stay: Payer: BC Managed Care – PPO

## 2022-01-06 ENCOUNTER — Other Ambulatory Visit: Payer: Self-pay

## 2022-01-06 ENCOUNTER — Inpatient Hospital Stay (HOSPITAL_BASED_OUTPATIENT_CLINIC_OR_DEPARTMENT_OTHER): Payer: BC Managed Care – PPO | Admitting: Hematology and Oncology

## 2022-01-06 VITALS — HR 95

## 2022-01-06 DIAGNOSIS — Z5111 Encounter for antineoplastic chemotherapy: Secondary | ICD-10-CM | POA: Diagnosis not present

## 2022-01-06 DIAGNOSIS — Z171 Estrogen receptor negative status [ER-]: Secondary | ICD-10-CM | POA: Diagnosis not present

## 2022-01-06 DIAGNOSIS — Z95828 Presence of other vascular implants and grafts: Secondary | ICD-10-CM

## 2022-01-06 DIAGNOSIS — Z79899 Other long term (current) drug therapy: Secondary | ICD-10-CM | POA: Diagnosis not present

## 2022-01-06 DIAGNOSIS — C50411 Malignant neoplasm of upper-outer quadrant of right female breast: Secondary | ICD-10-CM | POA: Diagnosis not present

## 2022-01-06 DIAGNOSIS — G44209 Tension-type headache, unspecified, not intractable: Secondary | ICD-10-CM | POA: Diagnosis not present

## 2022-01-06 DIAGNOSIS — D6481 Anemia due to antineoplastic chemotherapy: Secondary | ICD-10-CM | POA: Diagnosis not present

## 2022-01-06 DIAGNOSIS — Z9011 Acquired absence of right breast and nipple: Secondary | ICD-10-CM | POA: Diagnosis not present

## 2022-01-06 DIAGNOSIS — D72819 Decreased white blood cell count, unspecified: Secondary | ICD-10-CM | POA: Diagnosis not present

## 2022-01-06 DIAGNOSIS — R110 Nausea: Secondary | ICD-10-CM | POA: Diagnosis not present

## 2022-01-06 DIAGNOSIS — T451X5A Adverse effect of antineoplastic and immunosuppressive drugs, initial encounter: Secondary | ICD-10-CM | POA: Diagnosis not present

## 2022-01-06 LAB — CMP (CANCER CENTER ONLY)
ALT: 17 U/L (ref 0–44)
AST: 16 U/L (ref 15–41)
Albumin: 4.4 g/dL (ref 3.5–5.0)
Alkaline Phosphatase: 51 U/L (ref 38–126)
Anion gap: 7 (ref 5–15)
BUN: 10 mg/dL (ref 6–20)
CO2: 29 mmol/L (ref 22–32)
Calcium: 9.4 mg/dL (ref 8.9–10.3)
Chloride: 101 mmol/L (ref 98–111)
Creatinine: 0.53 mg/dL (ref 0.44–1.00)
GFR, Estimated: >60 mL/min (ref >60)
Glucose, Bld: 90 mg/dL (ref 70–99)
Potassium: 3.9 mmol/L (ref 3.5–5.1)
Sodium: 137 mmol/L (ref 135–145)
Total Bilirubin: 0.4 mg/dL (ref 0.3–1.2)
Total Protein: 7 g/dL (ref 6.5–8.1)

## 2022-01-06 LAB — CBC WITH DIFFERENTIAL (CANCER CENTER ONLY)
Abs Immature Granulocytes: 0.01 10*3/uL (ref 0.00–0.07)
Basophils Absolute: 0 10*3/uL (ref 0.0–0.1)
Basophils Relative: 1 %
Eosinophils Absolute: 0.1 10*3/uL (ref 0.0–0.5)
Eosinophils Relative: 2 %
HCT: 28.5 % — ABNORMAL LOW (ref 36.0–46.0)
Hemoglobin: 10.1 g/dL — ABNORMAL LOW (ref 12.0–15.0)
Immature Granulocytes: 0 %
Lymphocytes Relative: 28 %
Lymphs Abs: 0.9 10*3/uL (ref 0.7–4.0)
MCH: 34.4 pg — ABNORMAL HIGH (ref 26.0–34.0)
MCHC: 35.4 g/dL (ref 30.0–36.0)
MCV: 96.9 fL (ref 80.0–100.0)
Monocytes Absolute: 0.4 10*3/uL (ref 0.1–1.0)
Monocytes Relative: 11 %
Neutro Abs: 1.9 10*3/uL (ref 1.7–7.7)
Neutrophils Relative %: 58 %
Platelet Count: 196 10*3/uL (ref 150–400)
RBC: 2.94 MIL/uL — ABNORMAL LOW (ref 3.87–5.11)
RDW: 14.6 % (ref 11.5–15.5)
WBC Count: 3.3 10*3/uL — ABNORMAL LOW (ref 4.0–10.5)
nRBC: 0 % (ref 0.0–0.2)

## 2022-01-06 MED ORDER — SODIUM CHLORIDE 0.9 % IV SOLN: Freq: Once | INTRAVENOUS | Status: AC

## 2022-01-06 MED ORDER — SODIUM CHLORIDE 0.9 % IV SOLN
80.0000 mg/m2 | Freq: Once | INTRAVENOUS | Status: AC
  Administered 2022-01-06: 150 mg via INTRAVENOUS
  Filled 2022-01-06: qty 25

## 2022-01-06 MED ORDER — SODIUM CHLORIDE 0.9% FLUSH
10.0000 mL | Freq: Once | INTRAVENOUS | Status: AC
  Administered 2022-01-06: 10 mL

## 2022-01-06 MED ORDER — HEPARIN SOD (PORK) LOCK FLUSH 100 UNIT/ML IV SOLN
500.0000 [IU] | Freq: Once | INTRAVENOUS | Status: AC | PRN
  Administered 2022-01-06: 500 [IU]

## 2022-01-06 MED ORDER — SODIUM CHLORIDE 0.9 % IV SOLN
10.0000 mg | Freq: Once | INTRAVENOUS | Status: AC
  Administered 2022-01-06: 10 mg via INTRAVENOUS
  Filled 2022-01-06: qty 10

## 2022-01-06 MED ORDER — SODIUM CHLORIDE 0.9% FLUSH
10.0000 mL | INTRAVENOUS | Status: DC | PRN
  Administered 2022-01-06: 10 mL

## 2022-01-06 MED ORDER — FAMOTIDINE IN NACL 20-0.9 MG/50ML-% IV SOLN
20.0000 mg | Freq: Once | INTRAVENOUS | Status: AC
  Administered 2022-01-06: 20 mg via INTRAVENOUS
  Filled 2022-01-06: qty 50

## 2022-01-06 MED ORDER — PALONOSETRON HCL INJECTION 0.25 MG/5ML
0.2500 mg | Freq: Once | INTRAVENOUS | Status: AC
  Administered 2022-01-06: 0.25 mg via INTRAVENOUS
  Filled 2022-01-06: qty 5

## 2022-01-06 MED ORDER — DIPHENHYDRAMINE HCL 50 MG/ML IJ SOLN
12.5000 mg | Freq: Once | INTRAMUSCULAR | Status: AC
  Administered 2022-01-06: 12.5 mg via INTRAVENOUS
  Filled 2022-01-06: qty 1

## 2022-01-06 NOTE — Patient Instructions (Signed)
[facility]  Discharge Instructions: ?Thank you for choosing [facility] to provide your oncology and hematology care.  ? ?If you have a lab appointment with the [facility], please go directly to the [facility] and check in at the registration area. ?  ?Wear comfortable clothing and clothing appropriate for easy access to any Portacath or PICC line.  ? ?We strive to give you quality time with your provider. You may need to reschedule your appointment if you arrive late (15 or more minutes).  Arriving late affects you and other patients whose appointments are after yours.  Also, if you miss three or more appointments without notifying the office, you may be dismissed from the clinic at the provider?s discretion.    ?  ?For prescription refill requests, have your pharmacy contact our office and allow 72 hours for refills to be completed.   ? ?Today you received the following chemotherapy and/or immunotherapy agents: Taxol.    ?  ?To help prevent nausea and vomiting after your treatment, we encourage you to take your nausea medication as directed. ? ?BELOW ARE SYMPTOMS THAT SHOULD BE REPORTED IMMEDIATELY: ?*FEVER GREATER THAN 100.4 F (38 ?C) OR HIGHER ?*CHILLS OR SWEATING ?*NAUSEA AND VOMITING THAT IS NOT CONTROLLED WITH YOUR NAUSEA MEDICATION ?*UNUSUAL SHORTNESS OF BREATH ?*UNUSUAL BRUISING OR BLEEDING ?*URINARY PROBLEMS (pain or burning when urinating, or frequent urination) ?*BOWEL PROBLEMS (unusual diarrhea, constipation, pain near the anus) ?TENDERNESS IN MOUTH AND THROAT WITH OR WITHOUT PRESENCE OF ULCERS (sore throat, sores in mouth, or a toothache) ?UNUSUAL RASH, SWELLING OR PAIN  ?UNUSUAL VAGINAL DISCHARGE OR ITCHING  ? ?Items with * indicate a potential emergency and should be followed up as soon as possible or go to the Emergency Department if any problems should occur. ? ?Please show the CHEMOTHERAPY ALERT CARD or IMMUNOTHERAPY ALERT CARD at check-in to the  Emergency Department and triage nurse. ? ?Should you have questions after your visit or need to cancel or reschedule your appointment, please contact [facility]  Dept: [phone]  and follow the prompts.  Office hours are 8:00 a.m. to 4:30 p.m. Monday - Friday. Please note that voicemails left after 4:00 p.m. may not be returned until the following business day.  We are closed weekends and major holidays. You have access to a nurse at all times for urgent questions. Please call the main number to the clinic Dept: [phone] and follow the prompts. ? ? ?For any non-urgent questions, you may also contact your provider using MyChart. We now offer e-Visits for anyone 28 and older to request care online for non-urgent symptoms. For details visit [email or web address]. ?  ?Also download the MyChart app! Go to the app store, search "MyChart", open the app, select [facility], and log in with your MyChart username and password. ? ?Due to Covid, a mask is required upon entering the hospital/clinic. If you do not have a mask, one will be given to you upon arrival. For doctor visits, patients may have 1 support person aged 69 or older with them. For treatment visits, patients cannot have anyone with them due to current Covid guidelines and our immunocompromised population.  ? ?

## 2022-01-06 NOTE — Assessment & Plan Note (Signed)
07/08/2021:?Palpable right breast lump. Diagnostic mammogram: showed highly suspicious right breast mass and an indeterminate single right axillary lymph node with up to 5 mm diffuse cortical thickening. Biopsy: grade 2-3 invasive ductal carcinoma, DCIS, and right axillary lymph node negative for metastatic carcinoma; ER+(80%)/PR-/Her2-. ?? ?On the final pathology her estrogen receptor is negative and therefore patient has triple negative breast cancer ?? ?Recommendations: ?1.?Right mastectomy: Grade 3 IDC 2.8 cm with high-grade DCIS, margins negative, 0/3 lymph nodes negative, ER 0%, PR 0%, HER2 negative, Ki-67?40%?(final pathology is triple negative disease) ?2.?adjuvant chemotherapy with dose dense Adriamycin and Cytoxan followed by Taxol and carboplatin ?3. Genetic testing ?-------------------------------------------------------------------------------------------------- ?Current treatment:?Completed 4 cycles of?dose dense Adriamycin and Cytoxan, today?is?cycle?7?Taxol with carboplatin (every 3 weeks) ?? ?Chemo Toxicities: ?1.?Chemo induced anemia: Today's hemoglobin is?10.4 ?2.?leukopenia: Monitoring closely. ?Today ANC is 2 ?3.??Mild nausea resolved with Aloxi ?? ?Monitoring for toxicity especially peripheral neuropathy. ?Once chemotherapy is finished we discussed about obtaining CT chest abdomen pelvis, bone scan and a brain MRI in June 2023 ?? ?Return to clinic?weekly for Taxol and every other week for follow-up with me. ?

## 2022-01-12 ENCOUNTER — Encounter: Payer: Self-pay | Admitting: Hematology and Oncology

## 2022-01-13 ENCOUNTER — Inpatient Hospital Stay: Payer: BC Managed Care – PPO

## 2022-01-13 ENCOUNTER — Encounter: Payer: Self-pay | Admitting: *Deleted

## 2022-01-13 ENCOUNTER — Other Ambulatory Visit: Payer: Self-pay

## 2022-01-13 VITALS — BP 107/64 | HR 98 | Temp 98.4°F | Resp 20 | Wt 198.8 lb

## 2022-01-13 DIAGNOSIS — D6481 Anemia due to antineoplastic chemotherapy: Secondary | ICD-10-CM | POA: Diagnosis not present

## 2022-01-13 DIAGNOSIS — Z5111 Encounter for antineoplastic chemotherapy: Secondary | ICD-10-CM | POA: Diagnosis not present

## 2022-01-13 DIAGNOSIS — Z171 Estrogen receptor negative status [ER-]: Secondary | ICD-10-CM | POA: Diagnosis not present

## 2022-01-13 DIAGNOSIS — T451X5A Adverse effect of antineoplastic and immunosuppressive drugs, initial encounter: Secondary | ICD-10-CM | POA: Diagnosis not present

## 2022-01-13 DIAGNOSIS — Z95828 Presence of other vascular implants and grafts: Secondary | ICD-10-CM

## 2022-01-13 DIAGNOSIS — Z79899 Other long term (current) drug therapy: Secondary | ICD-10-CM | POA: Diagnosis not present

## 2022-01-13 DIAGNOSIS — C50411 Malignant neoplasm of upper-outer quadrant of right female breast: Secondary | ICD-10-CM

## 2022-01-13 DIAGNOSIS — R110 Nausea: Secondary | ICD-10-CM | POA: Diagnosis not present

## 2022-01-13 DIAGNOSIS — D72819 Decreased white blood cell count, unspecified: Secondary | ICD-10-CM | POA: Diagnosis not present

## 2022-01-13 DIAGNOSIS — Z9011 Acquired absence of right breast and nipple: Secondary | ICD-10-CM | POA: Diagnosis not present

## 2022-01-13 DIAGNOSIS — G44209 Tension-type headache, unspecified, not intractable: Secondary | ICD-10-CM | POA: Diagnosis not present

## 2022-01-13 LAB — CBC WITH DIFFERENTIAL (CANCER CENTER ONLY)
Abs Immature Granulocytes: 0.04 10*3/uL (ref 0.00–0.07)
Basophils Absolute: 0 10*3/uL (ref 0.0–0.1)
Basophils Relative: 1 %
Eosinophils Absolute: 0.1 10*3/uL (ref 0.0–0.5)
Eosinophils Relative: 2 %
HCT: 28.9 % — ABNORMAL LOW (ref 36.0–46.0)
Hemoglobin: 10.2 g/dL — ABNORMAL LOW (ref 12.0–15.0)
Immature Granulocytes: 1 %
Lymphocytes Relative: 28 %
Lymphs Abs: 0.9 10*3/uL (ref 0.7–4.0)
MCH: 35.1 pg — ABNORMAL HIGH (ref 26.0–34.0)
MCHC: 35.3 g/dL (ref 30.0–36.0)
MCV: 99.3 fL (ref 80.0–100.0)
Monocytes Absolute: 0.5 10*3/uL (ref 0.1–1.0)
Monocytes Relative: 15 %
Neutro Abs: 1.8 10*3/uL (ref 1.7–7.7)
Neutrophils Relative %: 53 %
Platelet Count: 220 10*3/uL (ref 150–400)
RBC: 2.91 MIL/uL — ABNORMAL LOW (ref 3.87–5.11)
RDW: 14.8 % (ref 11.5–15.5)
WBC Count: 3.4 10*3/uL — ABNORMAL LOW (ref 4.0–10.5)
nRBC: 0.6 % — ABNORMAL HIGH (ref 0.0–0.2)

## 2022-01-13 LAB — CMP (CANCER CENTER ONLY)
ALT: 17 U/L (ref 0–44)
AST: 14 U/L — ABNORMAL LOW (ref 15–41)
Albumin: 4.2 g/dL (ref 3.5–5.0)
Alkaline Phosphatase: 53 U/L (ref 38–126)
Anion gap: 5 (ref 5–15)
BUN: 17 mg/dL (ref 6–20)
CO2: 29 mmol/L (ref 22–32)
Calcium: 9.4 mg/dL (ref 8.9–10.3)
Chloride: 102 mmol/L (ref 98–111)
Creatinine: 0.59 mg/dL (ref 0.44–1.00)
GFR, Estimated: >60 mL/min (ref >60)
Glucose, Bld: 103 mg/dL — ABNORMAL HIGH (ref 70–99)
Potassium: 4 mmol/L (ref 3.5–5.1)
Sodium: 136 mmol/L (ref 135–145)
Total Bilirubin: 0.3 mg/dL (ref 0.3–1.2)
Total Protein: 7 g/dL (ref 6.5–8.1)

## 2022-01-13 MED ORDER — SODIUM CHLORIDE 0.9 % IV SOLN: Freq: Once | INTRAVENOUS | Status: AC

## 2022-01-13 MED ORDER — SODIUM CHLORIDE 0.9 % IV SOLN
10.0000 mg | Freq: Once | INTRAVENOUS | Status: AC
  Administered 2022-01-13: 10 mg via INTRAVENOUS
  Filled 2022-01-13: qty 10

## 2022-01-13 MED ORDER — SODIUM CHLORIDE 0.9 % IV SOLN
80.0000 mg/m2 | Freq: Once | INTRAVENOUS | Status: AC
  Administered 2022-01-13: 150 mg via INTRAVENOUS
  Filled 2022-01-13: qty 25

## 2022-01-13 MED ORDER — SODIUM CHLORIDE 0.9% FLUSH
10.0000 mL | Freq: Once | INTRAVENOUS | Status: AC
  Administered 2022-01-13: 10 mL

## 2022-01-13 MED ORDER — SODIUM CHLORIDE 0.9% FLUSH
10.0000 mL | INTRAVENOUS | Status: DC | PRN
  Administered 2022-01-13: 10 mL

## 2022-01-13 MED ORDER — PALONOSETRON HCL INJECTION 0.25 MG/5ML
0.2500 mg | Freq: Once | INTRAVENOUS | Status: AC
  Administered 2022-01-13: 0.25 mg via INTRAVENOUS
  Filled 2022-01-13: qty 5

## 2022-01-13 MED ORDER — DIPHENHYDRAMINE HCL 50 MG/ML IJ SOLN
12.5000 mg | Freq: Once | INTRAMUSCULAR | Status: AC
  Administered 2022-01-13: 12.5 mg via INTRAVENOUS
  Filled 2022-01-13: qty 1

## 2022-01-13 MED ORDER — HEPARIN SOD (PORK) LOCK FLUSH 100 UNIT/ML IV SOLN
500.0000 [IU] | Freq: Once | INTRAVENOUS | Status: AC | PRN
  Administered 2022-01-13: 500 [IU]

## 2022-01-13 MED ORDER — FAMOTIDINE IN NACL 20-0.9 MG/50ML-% IV SOLN
20.0000 mg | Freq: Once | INTRAVENOUS | Status: AC
  Administered 2022-01-13: 20 mg via INTRAVENOUS
  Filled 2022-01-13: qty 50

## 2022-01-13 NOTE — Patient Instructions (Signed)
[facility]ICAL [facility]COLOG[facility]   Discharge Instructions: Thank you for choosing [facility] to provide your oncology and hematology care.   If you have a lab appointment with the Cancer Center, please go directly to the Cancer Center and check in at the registration area.   Wear comfortable clothing and clothing appropriate for easy access to any Portacath or PICC line.   We strive to give you quality time with your provider. You may need to reschedule your appointment if you arrive late (15 or more minutes).  Arriving late affects you and other patients whose appointments are after yours.  Also, if you miss three or more appointments without notifying the office, you may be dismissed from the clinic at the provider's discretion.      For prescription refill requests, have your pharmacy contact our office and allow 72 hours for refills to be completed.    Today you received the following chemotherapy and/or immunotherapy agents: paclitaxel.      To help prevent nausea and vomiting after your treatment, we encourage you to take your nausea medication as directed.  BELOW ARE SYMPTOMS THAT SHOULD BE REPORTED IMMEDIATELY: *FEVER GREATER THAN 100.4 F (38 C) OR HIGHER *CHILLS OR SWEATING *NAUSEA AND VOMITING THAT IS NOT CONTROLLED WITH YOUR NAUSEA MEDICATION *UNUSUAL SHORTNESS OF BREATH *UNUSUAL BRUISING OR BLEEDING *URINARY PROBLEMS (pain or burning when urinating, or frequent urination) *BOWEL PROBLEMS (unusual diarrhea, constipation, pain near the anus) TENDERNESS IN MOUTH AND THROAT WITH OR WITHOUT PRESENCE OF ULCERS (sore throat, sores in mouth, or a toothache) UNUSUAL RASH, SWELLING OR PAIN  UNUSUAL VAGINAL DISCHARGE OR ITCHING   Items with * indicate a potential emergency and should be followed up as soon as possible or go to the Emergency Department if any problems should occur.  Please show the CHEMOTHERAPY ALERT CARD or IMMUNOTHERAPY ALERT CARD at check-in  to the Emergency Department and triage nurse.  Should you have questions after your visit or need to cancel or reschedule your appointment, please contact [facility]  Dept: [phone]  and follow the prompts.  Office hours are 8:00 a.m. to 4:30 p.m. Monday - Friday. Please note that voicemails left after 4:00 p.m. may not be returned until the following business day.  We are closed weekends and major holidays. You have access to a nurse at all times for urgent questions. Please call the main number to the clinic Dept: [phone] and follow the prompts.   For any non-urgent questions, you may also contact your provider using MyChart. We now offer e-Visits for anyone 18 and older to request care online for non-urgent symptoms. For details visit [email or web address].   Also download the MyChart app! Go to the app store, search "MyChart", open the app, select [facility], and log in with your MyChart username and password.  Due to Covid, a mask is required upon entering the hospital/clinic. If you do not have a mask, one will be given to you upon arrival. For doctor visits, patients may have 1 support person aged 18 or older with them. For treatment visits, patients cannot have anyone with them due to current Covid guidelines and our immunocompromised population.   

## 2022-01-14 ENCOUNTER — Telehealth: Payer: Self-pay | Admitting: *Deleted

## 2022-01-14 NOTE — Telephone Encounter (Signed)
[employer] Emergency planning/management officer faxed request to obtain "medical records from 11/26/2021 through present for claim no.#: [id number] for [employer]GE[employer]UE ID No.#: [id number], to properly evaluate eligibility for Long Term Disability (LTD) benefits".   ?No form included with this request.   ?Record request for this claim placed in [facility] [employer] release of information/record request bin near [place] area for this staff to forward to [place] [employer] (SW) to process.   ?No forms activity needed or required by this or any form staff for this request.    ? ?  ?

## 2022-01-19 NOTE — Progress Notes (Signed)
? ?Patient Care Team: ?Crecencio Mc, MD as PCP - General (Internal Medicine) ?Mauro Kaufmann, RN as Oncology Nurse Navigator ?Rockwell Germany, RN as Oncology Nurse Navigator ?Jovita Kussmaul, MD as Consulting Physician (General Surgery) ?Nicholas Lose, MD as Consulting Physician (Hematology and Oncology) ?Eppie Gibson, MD as Attending Physician (Radiation Oncology) ? ?DIAGNOSIS:  ?Encounter Diagnoses  ?Name Primary?  ? Malignant neoplasm of upper-outer quadrant of right breast in female, estrogen receptor negative ([place])   ? Acute non intractable tension-type headache Yes  ? ? ?SUMMARY OF ONCOLOGIC HISTORY: ?Oncology History  ?Malignant neoplasm of upper-outer quadrant of right breast in female, estrogen receptor negative ([place])  ?07/08/2021 Initial Diagnosis  ? Palpable right breast lump. Diagnostic mammogram: showed highly suspicious right breast mass and an indeterminate single right axillary lymph node with up to 5 mm diffuse cortical thickening. Biopsy: grade 2-3 invasive ductal carcinoma, DCIS, and right axillary lymph node negative for metastatic carcinoma; ER+(80%)/PR-/Her2-. ?  ?07/16/2021 Cancer Staging  ? Staging form: Breast, AJCC 8th Edition ?- Clinical stage from 07/16/2021: Stage IIB (cT2, cN0, cM0, G3, ER+, PR-, HER2-) - Signed by Nicholas Lose, MD on 07/16/2021 ?Stage prefix: Initial diagnosis ?Histologic grading system: 3 grade system ? ?  ?08/18/2021 Surgery  ? Right mastectomy: Grade 3 IDC 2.8 cm with high-grade DCIS, margins negative, 0/3 lymph nodes negative, ER 0%, PR 0%, HER2 negative, Ki-67 60% (triple negative) ?  ?09/22/2021 -  Chemotherapy  ? Patient is on Treatment Plan : BREAST Dose Dense AC q14d / CARBOplatin D1 + PACLitaxel D1,8,15 q21d  ?   ?10/06/2021 Genetic Testing  ? Negative hereditary cancer genetic testing: no pathogenic variants detected in [facility] CancerNext-Expanded +RNAinsight Panel.  The report date is 10/06/2021.  ? ?The CancerNext-Expanded gene panel offered by [facility] and includes sequencing, rearrangement, and RNA analysis for the following 77 genes: AIP, ALK, APC, ATM, AXIN2, BAP1, BARD1, BLM, BMPR1A, BRCA1, BRCA2, BRIP1, CDC73, CDH1, CDK4, CDKN1B, CDKN2A, CHEK2, CTNNA1, DICER1, FANCC, FH, FLCN, GALNT12, KIF1B, LZTR1, MAX, MEN1, MET, MLH1, MSH2, MSH3, MSH6, MUTYH, NBN, NF1, NF2, NTHL1, PALB2, PHOX2B, PMS2, POT1, PRKAR1A, PTCH1, PTEN, RAD51C, RAD51D, RB1, RECQL, RET, SDHA, SDHAF2, SDHB, SDHC, SDHD, SMAD4, SMARCA4, SMARCB1, SMARCE1, STK11, SUFU, TMEM127, TP53, TSC1, TSC2, VHL and XRCC2 (sequencing and deletion/duplication); EGFR, EGLN1, HOXB13, KIT, MITF, PDGFRA, POLD1, and POLE (sequencing only); EPCAM and GREM1 (deletion/duplication only).  ?  ? ? ?CHIEF COMPLIANT:  triple negative breast cancer  ?Cycle 10 Taxol (carboplatin every 3 weeks) ? ?INTERVAL HISTORY: Katelyn Hobbs is a 46 y.o. with above mention  triple negative breast cancer. She presents to the clinic today for Cycle 10 taxol with [place]. She complains that she doesn't do well with the [place]. She states that she is tolerating the the Taxol.  She reports no new problems or concerns.  She continues to have intermittent headaches.  Therefore we will obtain a brain MRI in the next week or so.  Because of her abdominal bloating discomfort and pain as well as low back pain we will obtain a CT and a bone scan.  She would like to do this only after she completes her chemotherapy and finishes with an additional surgery. ? ? ?ALLERGIES:  is allergic to benadryl [diphenhydramine], latex, and phenergan [promethazine hcl]. ? ?MEDICATIONS:  ?Current Outpatient Medications  ?Medication Sig Dispense Refill  ? acetaminophen (TYLENOL) 500 MG tablet Take 1,000 mg by mouth every 8 (eight) hours as needed for moderate pain.    ? ALPRAZolam Duanne Moron  XR) 2 MG 24 hr tablet TAKE 1 TABLET BY MOUTH EVERYDAY AT BEDTIME 30 tablet 2  ? amoxicillin (AMOXIL) 500 MG capsule Take 1 capsule (500 mg total) by mouth 2 (two) times daily. 14  capsule 0  ? cetirizine (ZYRTEC) 10 MG tablet Take 10 mg by mouth at bedtime.    ? citalopram (CELEXA) 40 MG tablet Take 1 tablet (40 mg total) by mouth daily. 90 tablet 1  ? ergocalciferol (DRISDOL) 1.25 MG (50000 UT) capsule Take 1 capsule (50,000 Units total) by mouth once a week. 12 capsule 0  ? ibuprofen (ADVIL) 200 MG tablet Take 800 mg by mouth every 8 (eight) hours as needed for moderate pain.    ? lidocaine-prilocaine (EMLA) cream Apply to affected area once 30 g 3  ? LORazepam (ATIVAN) 0.5 MG tablet TAKE 1 TABLET (0.5 MG TOTAL) BY MOUTH AT BEDTIME AS NEEDED FOR SLEEP. 30 tablet 0  ? magic mouthwash w/lidocaine SOLN Take 5 mLs by mouth 3 (three) times daily as needed for mouth pain. 240 mL 1  ? ondansetron (ZOFRAN) 8 MG tablet TAKE 1 TABLET BY MOUTH 2 (TWO) TIMES DAILY AS NEEDED. START ON THE THIRD DAY AFTER CHEMOTHERAPY. 30 tablet 1  ? prochlorperazine (COMPAZINE) 10 MG tablet TAKE 1 TABLET (10 MG TOTAL) BY MOUTH EVERY 6 (SIX) HOURS AS NEEDED (NAUSEA OR VOMITING). 30 tablet 1  ? ?No current facility-administered medications for this visit.  ? ? ?PHYSICAL EXAMINATION: ?ECOG PERFORMANCE STATUS: 1 - Symptomatic but completely ambulatory ? ?Vitals:  ? 01/20/22 0942  ?BP: 121/70  ?Pulse: 71  ?Resp: 18  ?Temp: 97.8 ?F (36.6 ?C)  ?SpO2: 100%  ? ?Filed Weights  ? 01/20/22 0942  ?Weight: 197 lb 9.6 oz (89.6 kg)  ? ?  ? ?LABORATORY DATA:  ?I have reviewed the data as listed ? ?  Latest Ref Rng & Units 01/20/2022  ?  9:35 AM 01/13/2022  ?  9:23 AM 01/06/2022  ? 11:45 AM  ?CMP  ?Glucose 70 - 99 mg/dL 102   103   90    ?BUN 6 - 20 mg/dL '12   17   10    ' ?Creatinine 0.44 - 1.00 mg/dL 0.64   0.59   0.53    ?Sodium 135 - 145 mmol/L 139   136   137    ?Potassium 3.5 - 5.1 mmol/L 3.7   4.0   3.9    ?Chloride 98 - 111 mmol/L 104   102   101    ?CO2 22 - 32 mmol/L '29   29   29    ' ?Calcium 8.9 - 10.3 mg/dL 9.4   9.4   9.4    ?Total Protein 6.5 - 8.1 g/dL 6.9   7.0   7.0    ?Total Bilirubin 0.3 - 1.2 mg/dL 0.3   0.3   0.4     ?Alkaline Phos 38 - 126 U/L 58   53   51    ?AST 15 - 41 U/L '16   14   16    ' ?ALT 0 - 44 U/L '20   17   17    ' ? ? ?Lab Results  ?Component Value Date  ? WBC 3.5 (L) 01/20/2022  ? HGB 10.1 (L) 01/20/2022  ? HCT 28.8 (L) 01/20/2022  ? MCV 100.3 (H) 01/20/2022  ? PLT 150 01/20/2022  ? NEUTROABS 2.0 01/20/2022  ? ? ?ASSESSMENT & PLAN:  ?Malignant neoplasm of upper-outer quadrant of right breast in female,  estrogen receptor negative ([place]) ?07/08/2021: Palpable right breast lump. Diagnostic mammogram: showed highly suspicious right breast mass and an indeterminate single right axillary lymph node with up to 5 mm diffuse cortical thickening. Biopsy: grade 2-3 invasive ductal carcinoma, DCIS, and right axillary lymph node negative for metastatic carcinoma; ER+(80%)/PR-/Her2-. ?  ?On the final pathology her estrogen receptor is negative and therefore patient has triple negative breast cancer ?  ?Recommendations: ?1. Right mastectomy: Grade 3 IDC 2.8 cm with high-grade DCIS, margins negative, 0/3 lymph nodes negative, ER 0%, PR 0%, HER2 negative, Ki-67 40% (final pathology is triple negative disease) ?2. adjuvant chemotherapy with dose dense Adriamycin and Cytoxan followed by Taxol and carboplatin ?3. Genetic testing ?-------------------------------------------------------------------------------------------------- ?Current treatment: Completed 4 cycles of dose dense Adriamycin and Cytoxan, today is cycle 10 Taxol with carboplatin (every 3 weeks) ?  ?Chemo Toxicities: ?1. Chemo induced anemia: Today's hemoglobin is 10.1 ?2. leukopenia: Monitoring closely.  Today ANC is 1.9 ?3.  Mild nausea resolved with Aloxi ?  ?Monitoring for toxicity especially peripheral neuropathy. ?For work-up of mild abdominal discomfort and back pain we will obtain CT chest abdomen pelvis, bone scan.  She [facility] to wait to [facility].  ?Recurrent headaches: Brain MRI will be done in a week. ? ?We also discussed about the role of minoxidil to  help with the scalp hair growth as well as Latisse for the eyelashes. ? ?She is going to have another surgery in May to remove the extra tissue that was left after the mastectomy in the lower right axillary area. ?R

## 2022-01-20 ENCOUNTER — Telehealth: Payer: Self-pay | Admitting: Hematology and Oncology

## 2022-01-20 ENCOUNTER — Inpatient Hospital Stay: Payer: BC Managed Care – PPO

## 2022-01-20 ENCOUNTER — Other Ambulatory Visit: Payer: Self-pay

## 2022-01-20 ENCOUNTER — Inpatient Hospital Stay (HOSPITAL_BASED_OUTPATIENT_CLINIC_OR_DEPARTMENT_OTHER): Payer: BC Managed Care – PPO | Admitting: Hematology and Oncology

## 2022-01-20 VITALS — BP 121/70 | HR 71 | Temp 97.8°F | Resp 18 | Ht 64.0 in | Wt 197.6 lb

## 2022-01-20 DIAGNOSIS — Z171 Estrogen receptor negative status [ER-]: Secondary | ICD-10-CM

## 2022-01-20 DIAGNOSIS — R110 Nausea: Secondary | ICD-10-CM | POA: Diagnosis not present

## 2022-01-20 DIAGNOSIS — Z9011 Acquired absence of right breast and nipple: Secondary | ICD-10-CM | POA: Diagnosis not present

## 2022-01-20 DIAGNOSIS — D72819 Decreased white blood cell count, unspecified: Secondary | ICD-10-CM | POA: Diagnosis not present

## 2022-01-20 DIAGNOSIS — C50411 Malignant neoplasm of upper-outer quadrant of right female breast: Secondary | ICD-10-CM

## 2022-01-20 DIAGNOSIS — Z79899 Other long term (current) drug therapy: Secondary | ICD-10-CM | POA: Diagnosis not present

## 2022-01-20 DIAGNOSIS — Z5111 Encounter for antineoplastic chemotherapy: Secondary | ICD-10-CM | POA: Diagnosis not present

## 2022-01-20 DIAGNOSIS — T451X5A Adverse effect of antineoplastic and immunosuppressive drugs, initial encounter: Secondary | ICD-10-CM | POA: Diagnosis not present

## 2022-01-20 DIAGNOSIS — D6481 Anemia due to antineoplastic chemotherapy: Secondary | ICD-10-CM | POA: Diagnosis not present

## 2022-01-20 DIAGNOSIS — Z95828 Presence of other vascular implants and grafts: Secondary | ICD-10-CM

## 2022-01-20 DIAGNOSIS — G44209 Tension-type headache, unspecified, not intractable: Secondary | ICD-10-CM

## 2022-01-20 DIAGNOSIS — Z170 Estrogen receptor positive status [ER+]: Secondary | ICD-10-CM

## 2022-01-20 LAB — CBC WITH DIFFERENTIAL (CANCER CENTER ONLY)
Abs Immature Granulocytes: 0.08 10*3/uL — ABNORMAL HIGH (ref 0.00–0.07)
Basophils Absolute: 0 10*3/uL (ref 0.0–0.1)
Basophils Relative: 1 %
Eosinophils Absolute: 0.1 10*3/uL (ref 0.0–0.5)
Eosinophils Relative: 2 %
HCT: 28.8 % — ABNORMAL LOW (ref 36.0–46.0)
Hemoglobin: 10.1 g/dL — ABNORMAL LOW (ref 12.0–15.0)
Immature Granulocytes: 2 %
Lymphocytes Relative: 27 %
Lymphs Abs: 0.9 10*3/uL (ref 0.7–4.0)
MCH: 35.2 pg — ABNORMAL HIGH (ref 26.0–34.0)
MCHC: 35.1 g/dL (ref 30.0–36.0)
MCV: 100.3 fL — ABNORMAL HIGH (ref 80.0–100.0)
Monocytes Absolute: 0.4 10*3/uL (ref 0.1–1.0)
Monocytes Relative: 10 %
Neutro Abs: 2 10*3/uL (ref 1.7–7.7)
Neutrophils Relative %: 58 %
Platelet Count: 150 10*3/uL (ref 150–400)
RBC: 2.87 MIL/uL — ABNORMAL LOW (ref 3.87–5.11)
RDW: 14.6 % (ref 11.5–15.5)
WBC Count: 3.5 10*3/uL — ABNORMAL LOW (ref 4.0–10.5)
nRBC: 0.6 % — ABNORMAL HIGH (ref 0.0–0.2)

## 2022-01-20 LAB — CMP (CANCER CENTER ONLY)
ALT: 20 U/L (ref 0–44)
AST: 16 U/L (ref 15–41)
Albumin: 4.1 g/dL (ref 3.5–5.0)
Alkaline Phosphatase: 58 U/L (ref 38–126)
Anion gap: 6 (ref 5–15)
BUN: 12 mg/dL (ref 6–20)
CO2: 29 mmol/L (ref 22–32)
Calcium: 9.4 mg/dL (ref 8.9–10.3)
Chloride: 104 mmol/L (ref 98–111)
Creatinine: 0.64 mg/dL (ref 0.44–1.00)
GFR, Estimated: >60 mL/min (ref >60)
Glucose, Bld: 102 mg/dL — ABNORMAL HIGH (ref 70–99)
Potassium: 3.7 mmol/L (ref 3.5–5.1)
Sodium: 139 mmol/L (ref 135–145)
Total Bilirubin: 0.3 mg/dL (ref 0.3–1.2)
Total Protein: 6.9 g/dL (ref 6.5–8.1)

## 2022-01-20 MED ORDER — SODIUM CHLORIDE 0.9 % IV SOLN
700.0000 mg | Freq: Once | INTRAVENOUS | Status: AC
  Administered 2022-01-20: 700 mg via INTRAVENOUS
  Filled 2022-01-20: qty 70

## 2022-01-20 MED ORDER — SODIUM CHLORIDE 0.9 % IV SOLN
80.0000 mg/m2 | Freq: Once | INTRAVENOUS | Status: AC
  Administered 2022-01-20: 150 mg via INTRAVENOUS
  Filled 2022-01-20: qty 25

## 2022-01-20 MED ORDER — SODIUM CHLORIDE 0.9% FLUSH
10.0000 mL | INTRAVENOUS | Status: DC | PRN
  Administered 2022-01-20: 10 mL

## 2022-01-20 MED ORDER — SODIUM CHLORIDE 0.9 % IV SOLN
10.0000 mg | Freq: Once | INTRAVENOUS | Status: AC
  Administered 2022-01-20: 10 mg via INTRAVENOUS
  Filled 2022-01-20: qty 10

## 2022-01-20 MED ORDER — SODIUM CHLORIDE 0.9% FLUSH
10.0000 mL | Freq: Once | INTRAVENOUS | Status: AC
  Administered 2022-01-20: 10 mL

## 2022-01-20 MED ORDER — SODIUM CHLORIDE 0.9 % IV SOLN: Freq: Once | INTRAVENOUS | Status: AC

## 2022-01-20 MED ORDER — FAMOTIDINE IN NACL 20-0.9 MG/50ML-% IV SOLN
20.0000 mg | Freq: Once | INTRAVENOUS | Status: AC
  Administered 2022-01-20: 20 mg via INTRAVENOUS
  Filled 2022-01-20: qty 50

## 2022-01-20 MED ORDER — SODIUM CHLORIDE 0.9 % IV SOLN
150.0000 mg | Freq: Once | INTRAVENOUS | Status: AC
  Administered 2022-01-20: 150 mg via INTRAVENOUS
  Filled 2022-01-20: qty 150

## 2022-01-20 MED ORDER — DIPHENHYDRAMINE HCL 50 MG/ML IJ SOLN
12.5000 mg | Freq: Once | INTRAMUSCULAR | Status: AC
  Administered 2022-01-20: 12.5 mg via INTRAVENOUS
  Filled 2022-01-20: qty 1

## 2022-01-20 MED ORDER — PALONOSETRON HCL INJECTION 0.25 MG/5ML
0.2500 mg | Freq: Once | INTRAVENOUS | Status: AC
  Administered 2022-01-20: 0.25 mg via INTRAVENOUS
  Filled 2022-01-20: qty 5

## 2022-01-20 MED ORDER — HEPARIN SOD (PORK) LOCK FLUSH 100 UNIT/ML IV SOLN
500.0000 [IU] | Freq: Once | INTRAVENOUS | Status: AC | PRN
  Administered 2022-01-20: 500 [IU]

## 2022-01-20 NOTE — Patient Instructions (Signed)
[facility]   ?Discharge Instructions: ?Thank you for choosing [facility] to provide your oncology and hematology care.  ? ?If you have a lab appointment with the [facility], please go directly to the [facility] and check in at the registration area. ?  ?Wear comfortable clothing and clothing appropriate for easy access to any Portacath or PICC line.  ? ?We strive to give you quality time with your provider. You may need to reschedule your appointment if you arrive late (15 or more minutes).  Arriving late affects you and other patients whose appointments are after yours.  Also, if you miss three or more appointments without notifying the office, you may be dismissed from the clinic at the provider?s discretion.    ?  ?For prescription refill requests, have your pharmacy contact our office and allow 72 hours for refills to be completed.   ? ?Today you received the following chemotherapy and/or immunotherapy agents: paclitaxel & Car[place]  ?  ?To help prevent nausea and vomiting after your treatment, we encourage you to take your nausea medication as directed. ? ?BELOW ARE SYMPTOMS THAT SHOULD BE REPORTED IMMEDIATELY: ?*FEVER GREATER THAN 100.4 F (38 ?C) OR HIGHER ?*CHILLS OR SWEATING ?*NAUSEA AND VOMITING THAT IS NOT CONTROLLED WITH YOUR NAUSEA MEDICATION ?*UNUSUAL SHORTNESS OF BREATH ?*UNUSUAL BRUISING OR BLEEDING ?*URINARY PROBLEMS (pain or burning when urinating, or frequent urination) ?*BOWEL PROBLEMS (unusual diarrhea, constipation, pain near the anus) ?TENDERNESS IN MOUTH AND THROAT WITH OR WITHOUT PRESENCE OF ULCERS (sore throat, sores in mouth, or a toothache) ?UNUSUAL RASH, SWELLING OR PAIN  ?UNUSUAL VAGINAL DISCHARGE OR ITCHING  ? ?Items with * indicate a potential emergency and should be followed up as soon as possible or go to the Emergency Department if any problems should occur. ? ?Please show the CHEMOTHERAPY ALERT CARD or IMMUNOTHERAPY ALERT CARD at  check-in to the Emergency Department and triage nurse. ? ?Should you have questions after your visit or need to cancel or reschedule your appointment, please contact [facility]  Dept: [phone]  and follow the prompts.  Office hours are 8:00 a.m. to 4:30 p.m. Monday - Friday. Please note that voicemails left after 4:00 p.m. may not be returned until the following business day.  We are closed weekends and major holidays. You have access to a nurse at all times for urgent questions. Please call the main number to the clinic Dept: [phone] and follow the prompts. ? ? ?For any non-urgent questions, you may also contact your provider using MyChart. We now offer e-Visits for anyone 30 and older to request care online for non-urgent symptoms. For details visit [email or web address]. ?  ?Also download the MyChart app! Go to the app store, search "MyChart", open the app, select [facility], and log in with your MyChart username and password. ? ?Due to Covid, a mask is required upon entering the hospital/clinic. If you do not have a mask, one will be given to you upon arrival. For doctor visits, patients may have 1 support [place] aged 70 or older with them. For treatment visits, patients cannot have anyone with them due to current Covid guidelines and our immunocompromised population.  ? ?

## 2022-01-20 NOTE — Telephone Encounter (Signed)
Scheduled appointment per 3/27 los. Left message. ?

## 2022-01-20 NOTE — Assessment & Plan Note (Addendum)
07/08/2021:?Palpable right breast lump. Diagnostic mammogram: showed highly suspicious right breast mass and an indeterminate single right axillary lymph node with up to 5 mm diffuse cortical thickening. Biopsy: grade 2-3 invasive ductal carcinoma, DCIS, and right axillary lymph node negative for metastatic carcinoma; ER+(80%)/PR-/Her2-. ?? ?On the final pathology her estrogen receptor is negative and therefore patient has triple negative breast cancer ?? ?Recommendations: ?1.?Right mastectomy: Grade 3 IDC 2.8 cm with high-grade DCIS, margins negative, 0/3 lymph nodes negative, ER 0%, PR 0%, HER2 negative, Ki-67?40%?(final pathology is triple negative disease) ?2.?adjuvant chemotherapy with dose dense Adriamycin and Cytoxan followed by Taxol and carboplatin ?3. Genetic testing ?-------------------------------------------------------------------------------------------------- ?Current treatment:?Completed 4 cycles of?dose dense Adriamycin and Cytoxan, today?is?cycle?10?Taxol with carboplatin (every 3 weeks) ?? ?Chemo Toxicities: ?1.?Chemo induced anemia: Today's hemoglobin is?10.1 ?2.?leukopenia: Monitoring closely. ?Today ANC is 1.9 ?3.??Mild nausea resolved with Aloxi ?? ?Monitoring for toxicity especially peripheral neuropathy. ?For work-up of mild abdominal discomfort and back pain we will obtain CT chest abdomen pelvis, bone scan.  She wants to wait to the [facility] for this.  ?Recurrent headaches: Brain MRI will be done in a week. ? ?We also discussed about the role of minoxidil to help with the scalp hair growth as well as Latisse for the eyelashes. ? ?She is going to have another surgery in May to remove the extra tissue that was left after the mastectomy in the lower right axillary area. ?Return to clinic?weekly for Taxol and every other week for follow-up with [place]. ?I will be seeing her back after the scans in May. ? ?

## 2022-01-21 ENCOUNTER — Encounter: Payer: Self-pay | Admitting: Rehabilitation

## 2022-01-21 ENCOUNTER — Ambulatory Visit: Payer: BC Managed Care – PPO | Admitting: Rehabilitation

## 2022-01-21 DIAGNOSIS — R600 Localized edema: Secondary | ICD-10-CM | POA: Diagnosis not present

## 2022-01-21 DIAGNOSIS — Z171 Estrogen receptor negative status [ER-]: Secondary | ICD-10-CM | POA: Diagnosis not present

## 2022-01-21 DIAGNOSIS — M25611 Stiffness of right shoulder, not elsewhere classified: Secondary | ICD-10-CM

## 2022-01-21 DIAGNOSIS — Z170 Estrogen receptor positive status [ER+]: Secondary | ICD-10-CM

## 2022-01-21 DIAGNOSIS — M25612 Stiffness of left shoulder, not elsewhere classified: Secondary | ICD-10-CM

## 2022-01-21 DIAGNOSIS — C50411 Malignant neoplasm of upper-outer quadrant of right female breast: Secondary | ICD-10-CM | POA: Diagnosis not present

## 2022-01-21 DIAGNOSIS — R293 Abnormal posture: Secondary | ICD-10-CM | POA: Diagnosis not present

## 2022-01-21 DIAGNOSIS — Z483 Aftercare following surgery for neoplasm: Secondary | ICD-10-CM

## 2022-01-21 NOTE — Therapy (Deleted)
?[facility] ?[place], [place], [address] ?Phone: [phone]   Fax:  [phone] ? ?Physical Therapy Evaluation ? ?Patient Details  ?Name: Katelyn Hobbs ?MRN: [id number] ?Date of Birth: October 15, 1976 ?Referring Provider (PT): Dr. Lindi Adie ? ? ?Encounter Date: 01/21/2022 ? ? ? ? ?Past Medical History:  ?Diagnosis Date  ? Abnormal uterine bleeding   ? ADHD (attention deficit hyperactivity disorder)   ? Anemia   ? Anxiety   ? Bacterial vaginosis   ? Cancer ([place]) 06/2021  ? breast cancer  ? Complication of anesthesia   ? Depression   ? Endometriosis   ? Family history of breast cancer 07/17/2021  ? Family history of melanoma 07/17/2021  ? Family history of prostate cancer 07/17/2021  ? Family history of uterine cancer 07/17/2021  ? Hyperlipidemia   ? Migraine   ? PCOS (polycystic ovarian syndrome)   ? PONV (postoperative nausea and vomiting)   ? ? ?Past Surgical History:  ?Procedure Laterality Date  ? IR IMAGING GUIDED PORT INSERTION  09/09/2021  ? LAPAROSCOPY  1993  ? MASTECTOMY W/ SENTINEL NODE BIOPSY Right 08/18/2021  ? Procedure: RIGHT MASTECTOMY WITH RIGHT SENTINEL LYMPH NODE BIOPSY;  Surgeon: Jovita Kussmaul, MD;  Location: [facility];  Service: General;  Laterality: Right;  ? SALPINGECTOMY Bilateral 2015  ? TOTAL MASTECTOMY Left 08/18/2021  ? Procedure: LEFT TOTAL MASTECTOMY;  Surgeon: Jovita Kussmaul, MD;  Location: [facility];  Service: General;  Laterality: Left;  ? VAGINAL HYSTERECTOMY  2017  ? ? ?There were no vitals filed for this visit. ? ? ? ? ? ? ? ? ? ? ? ? ? ? ? ? ? ? ? ? ? ? ?Objective measurements completed on examination: See above findings.  ? ? ? ? ? ? ? ? ? ? ? ? ? ? ? ? ? ? PT Short Term Goals - 12/24/21 0901   ? ?  ? PT SHORT TERM GOAL #1  ? 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 Time 1   ? Period Days   ? Status Achieved   ? ?  ?  ? ?  ? ? ? ? PT Long Term Goals - 12/24/21 0901   ? ?  ? PT LONG TERM  GOAL #1  ? Title Patient will demonstrate she has regained full shoulder ROM and function post operatively compared to baselines.   ? Status Achieved   ?  ? PT LONG TERM GOAL #2  ? Title Pt will be ind with final HEP for cording   ? Time 8   ? Period Weeks   ? Status New   ? ?  ?  ? ?  ? ? ? ? ? ? ? ? ? ? ? ?Patient will benefit from skilled therapeutic intervention in order to improve the following deficits and impairments:    ? ?Visit Diagnosis: ?Aftercare following surgery for neoplasm ? ?Malignant neoplasm of upper-outer quadrant of right breast in female, estrogen receptor positive ([place]) ? ?Abnormal posture ? ?Stiffness of left shoulder, not elsewhere classified ? ?Stiffness of right shoulder, not elsewhere classified ? ?Localized edema ? ? ? ? ?Problem List ?Patient Active Problem List  ? Diagnosis Date Noted  ? Port-A-Cath in place 10/21/2021  ? Cancer of right female breast ([place]) 08/18/2021  ? Family history of uterine cancer 07/17/2021  ? Family history of breast cancer 07/17/2021  ? Family  history of prostate cancer 07/17/2021  ? Family history of melanoma 07/17/2021  ? Genetic testing 07/17/2021  ? Malignant neoplasm of upper-outer quadrant of right breast in female, estrogen receptor negative ([place]) 07/14/2021  ? Otitis media 06/11/2021  ? Mass of breast, right 06/11/2021  ? Breast calcifications on mammogram 03/16/2021  ? Encounter for preventive health examination 01/08/2021  ? Primary insomnia 09/24/2020  ? PCOS (polycystic ovarian syndrome) 04/16/2020  ? Paroxysmal SVT (supraventricular tachycardia) ([place]) 04/16/2020  ? Screen for STD (sexually transmitted disease) 04/16/2020  ? S/P total hysterectomy 04/16/2020  ? GAD (generalized anxiety disorder) 11/11/2019  ? Vitamin D deficiency 09/21/2015  ? Elevated glucose level 09/21/2015  ? Obesity (BMI 30.0-34.9) 09/17/2015  ? ? ?Stark Bray, PT ?01/21/2022, 8:07 AM ? ?[facility] ?[place], [place], [address] ?Phone: [phone]   Fax:  [phone] ? ?Name: Katelyn Hobbs ?MRN: [id number] ?Date of Birth: [birth year] ? ? ?

## 2022-01-21 NOTE — Therapy (Signed)
[facility] ?[facility] ?[place], [place], [address] ?Phone: [phone]   Fax:  [phone] ? ?Physical Therapy Treatment ? ?Patient Details  ?Name: Katelyn Hobbs ?MRN: [id number] ?Date of Birth: 1975/12/13 ?Referring Provider (PT): Dr. Lindi Adie ? ? ?Encounter Date: 01/21/2022 ? ? PT End of Session - 01/21/22 0951   ? ? Visit Number 4   ? Number of Visits 5   ? Date for PT Re-Evaluation 02/18/22   ? PT Start Time [phone]   ? PT Stop Time 0857   ? PT Time Calculation (min) 49 min   ? Activity Tolerance Patient tolerated treatment well   ? Behavior During Therapy [facility] for tasks assessed/performed   ? ?  ?  ? ?  ? ? ?Past Medical History:  ?Diagnosis Date  ? Abnormal uterine bleeding   ? ADHD (attention deficit hyperactivity disorder)   ? Anemia   ? Anxiety   ? Bacterial vaginosis   ? Cancer ([facility]) 06/2021  ? breast cancer  ? Complication of anesthesia   ? Depression   ? Endometriosis   ? Family history of breast cancer 07/17/2021  ? Family history of melanoma 07/17/2021  ? Family history of prostate cancer 07/17/2021  ? Family history of uterine cancer 07/17/2021  ? Hyperlipidemia   ? Migraine   ? PCOS (polycystic ovarian syndrome)   ? PONV (postoperative nausea and vomiting)   ? ? ?Past Surgical History:  ?Procedure Laterality Date  ? IR IMAGING GUIDED PORT INSERTION  09/09/2021  ? LAPAROSCOPY  1993  ? MASTECTOMY W/ SENTINEL NODE BIOPSY Right 08/18/2021  ? Procedure: RIGHT MASTECTOMY WITH RIGHT SENTINEL LYMPH NODE BIOPSY;  Surgeon: Jovita Kussmaul, MD;  Location: [facility];  Service: General;  Laterality: Right;  ? SALPINGECTOMY Bilateral 2015  ? TOTAL MASTECTOMY Left 08/18/2021  ? Procedure: LEFT TOTAL MASTECTOMY;  Surgeon: Jovita Kussmaul, MD;  Location: [facility];  Service: General;  Laterality: Left;  ? VAGINAL HYSTERECTOMY  2017  ? ? ?There were no vitals filed for this visit. ? ? Subjective Assessment - 01/21/22 0807   ? ? Subjective I think I saw some more cording.   I am massaging it and I can pop it. Its better today because I had steroid infusion yesterday.   ? Pertinent History Rt mastectomy 08/18/21 due to Grade 3 triple negative breast cancer.  0/3LN negative.  Currently on carboplatin/paclitaxel.   ? Currently in Pain? No/denies   ? ?  ?  ? ?  ? ? ? ? ? ? ? ? ? L-DEX FLOWSHEETS - 01/21/22 0900   ? ?  ? L-DEX LYMPHEDEMA SCREENING  ? Measurement Type Unilateral   ? L-DEX MEASUREMENT EXTREMITY Upper Extremity   ? POSITION  Standing   ? DOMINANT SIDE Right   ? At Risk Side Right   ? BASELINE SCORE (UNILATERAL) 1.1   ? L-DEX SCORE (UNILATERAL) 7.7   ? VALUE CHANGE (UNILAT) 6.6   ? Comment gave pt medi harmony size 3 sleeve and size 2 gauntlet as pt is undergoing chemo and may have more hand swelling - instruction on wearing sleeve daytime only 12 hours per day x 1 month and then rechecking and how to slowly build into wearing if necessary   ? ?  ?  ? ?  ? ? ? ? ? ? ? ? ? ? ? ? [facility] Adult PT Treatment/Exercise - 01/21/22 0001   ? ?  ? Manual Therapy  ?  Soft tissue mobilization Along cording path with cocoa butter at end range PROM flexion and D2 flexion   ? Myofascial Release to Rt upper arm from axilla to wrist following cording using S technique and flat hand opposing pull.- 2 pops noted   ? ?  ?  ? ?  ? ? ? ? ? ? ? ? ? ? ? ? PT Short Term Goals - 12/24/21 0901   ? ?  ? PT SHORT TERM GOAL #1  ? 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 Time 1   ? Period Days   ? Status Achieved   ? ?  ?  ? ?  ? ? ? ? PT Long Term Goals - 12/24/21 0901   ? ?  ? PT LONG TERM GOAL #1  ? Title Patient will demonstrate she has regained full shoulder ROM and function post operatively compared to baselines.   ? Status Achieved   ?  ? PT LONG TERM GOAL #2  ? Title Pt will be ind with final HEP for cording   ? Time 8   ? Period Weeks   ? Status New   ? ?  ?  ? ?  ? ? ? ? ? ? ? ? Plan - 01/21/22 0949   ? ? Clinical Impression Statement Pt continues with same  cording but is doing well with self care including MFR and stretches.  Pt had increased SOZO from last time and now over 6.5 so she was issued a sleeve to wear x 70month  Pt had a chemotherapy infusion yesterday and has inflammation from cording which may be the reason for a temporary increase.  Pt will continue with 1 month follow ups.   ? PT Next Visit Plan recheck SOZO due to increase, how is cording? any needs? continue SOZO 3 month screens   ? Consulted and Agree with Plan of Care Patient   ? ?  ?  ? ?  ? ? ?Patient will benefit from skilled therapeutic intervention in order to improve the following deficits and impairments:    ? ?Visit Diagnosis: ?Aftercare following surgery for neoplasm ? ?Malignant neoplasm of upper-outer quadrant of right breast in female, estrogen receptor positive ([facility] ? ?Abnormal posture ? ?Stiffness of left shoulder, not elsewhere classified ? ?Stiffness of right shoulder, not elsewhere classified ? ?Localized edema ? ? ? ? ?Problem List ?Patient Active Problem List  ? Diagnosis Date Noted  ? Port-A-Cath in place 10/21/2021  ? Cancer of right female breast ([facility] 08/18/2021  ? Family history of uterine cancer 07/17/2021  ? Family history of breast cancer 07/17/2021  ? Family history of prostate cancer 07/17/2021  ? Family history of melanoma 07/17/2021  ? Genetic testing 07/17/2021  ? Malignant neoplasm of upper-outer quadrant of right breast in female, estrogen receptor negative ([facility] 07/14/2021  ? Otitis media 06/11/2021  ? Mass of breast, right 06/11/2021  ? Breast calcifications on mammogram 03/16/2021  ? Encounter for preventive health examination 01/08/2021  ? Primary insomnia 09/24/2020  ? PCOS (polycystic ovarian syndrome) 04/16/2020  ? Paroxysmal SVT (supraventricular tachycardia) ([facility] 04/16/2020  ? Screen for STD (sexually transmitted disease) 04/16/2020  ? S/P total hysterectomy 04/16/2020  ? GAD (generalized anxiety disorder) 11/11/2019  ? Vitamin D deficiency 09/21/2015  ?  Elevated glucose level 09/21/2015  ? Obesity (BMI 30.0-34.9) 09/17/2015  ? ? ?TStark Bray PT ?01/21/2022, 9:52 AM ? ?[facility] ?[place], [place], [address] ?Phone: [phone]   Fax:  [phone] ? ?Name: Katelyn Hobbs ?MRN: [id number] ?Date of Birth: Jun 04, 1976 ? ? ? ?

## 2022-01-22 ENCOUNTER — Encounter: Payer: Self-pay | Admitting: Hematology and Oncology

## 2022-01-22 ENCOUNTER — Telehealth: Payer: Self-pay | Admitting: *Deleted

## 2022-01-22 NOTE — Telephone Encounter (Signed)
"  Katelyn Hobbs [phone]).  Trying to get my short-term disability changed to long-term disability.  Previously, I spoke with Con Memos, who said this has been done but company has not received records.  Have given me specific details, instructions and claim number to write.  Call me to let me know if I can pick up my records and fax them myself." ? ?This forms nurse advised [name]: ?Unable to pick up records from this office, we do not manage medical record requests. ?Connect with the [facility] [employer] office about medical records.   ?Upon our receipt, this office sent request to ([facility]) [employer] on 01/14/2022.  (SW) [employer] noted their receipt as 01/16/2022.  Effective 01/22/2022, request on hold, in queue per [employer].   ?Call (SW) [facility].I[employer] [phone] opt 2) for further information, status check or if you prefer to request to receive your records by e-mail, mail or other available means you would like as records may not be picked up directly from this office.   ?Currently denies further concerns, needs or questions.   ?

## 2022-01-27 ENCOUNTER — Inpatient Hospital Stay: Payer: BC Managed Care – PPO | Attending: Hematology and Oncology

## 2022-01-27 ENCOUNTER — Inpatient Hospital Stay: Payer: BC Managed Care – PPO

## 2022-01-27 ENCOUNTER — Other Ambulatory Visit: Payer: Self-pay

## 2022-01-27 VITALS — BP 108/62 | HR 92 | Temp 98.9°F | Resp 17 | Wt 195.8 lb

## 2022-01-27 DIAGNOSIS — Z808 Family history of malignant neoplasm of other organs or systems: Secondary | ICD-10-CM | POA: Diagnosis not present

## 2022-01-27 DIAGNOSIS — Z79899 Other long term (current) drug therapy: Secondary | ICD-10-CM | POA: Diagnosis not present

## 2022-01-27 DIAGNOSIS — C50411 Malignant neoplasm of upper-outer quadrant of right female breast: Secondary | ICD-10-CM | POA: Insufficient documentation

## 2022-01-27 DIAGNOSIS — Z803 Family history of malignant neoplasm of breast: Secondary | ICD-10-CM | POA: Diagnosis not present

## 2022-01-27 DIAGNOSIS — Z9011 Acquired absence of right breast and nipple: Secondary | ICD-10-CM | POA: Diagnosis not present

## 2022-01-27 DIAGNOSIS — Z8042 Family history of malignant neoplasm of prostate: Secondary | ICD-10-CM | POA: Diagnosis not present

## 2022-01-27 DIAGNOSIS — Z95828 Presence of other vascular implants and grafts: Secondary | ICD-10-CM

## 2022-01-27 DIAGNOSIS — Z171 Estrogen receptor negative status [ER-]: Secondary | ICD-10-CM | POA: Insufficient documentation

## 2022-01-27 DIAGNOSIS — I639 Cerebral infarction, unspecified: Secondary | ICD-10-CM | POA: Diagnosis not present

## 2022-01-27 DIAGNOSIS — Z5111 Encounter for antineoplastic chemotherapy: Secondary | ICD-10-CM | POA: Insufficient documentation

## 2022-01-27 DIAGNOSIS — E785 Hyperlipidemia, unspecified: Secondary | ICD-10-CM | POA: Diagnosis not present

## 2022-01-27 LAB — CBC WITH DIFFERENTIAL (CANCER CENTER ONLY)
Abs Immature Granulocytes: 0.02 10*3/uL (ref 0.00–0.07)
Basophils Absolute: 0 10*3/uL (ref 0.0–0.1)
Basophils Relative: 1 %
Eosinophils Absolute: 0 10*3/uL (ref 0.0–0.5)
Eosinophils Relative: 1 %
HCT: 29.8 % — ABNORMAL LOW (ref 36.0–46.0)
Hemoglobin: 10.5 g/dL — ABNORMAL LOW (ref 12.0–15.0)
Immature Granulocytes: 1 %
Lymphocytes Relative: 29 %
Lymphs Abs: 0.9 10*3/uL (ref 0.7–4.0)
MCH: 35.1 pg — ABNORMAL HIGH (ref 26.0–34.0)
MCHC: 35.2 g/dL (ref 30.0–36.0)
MCV: 99.7 fL (ref 80.0–100.0)
Monocytes Absolute: 0.3 10*3/uL (ref 0.1–1.0)
Monocytes Relative: 10 %
Neutro Abs: 1.9 10*3/uL (ref 1.7–7.7)
Neutrophils Relative %: 58 %
Platelet Count: 178 10*3/uL (ref 150–400)
RBC: 2.99 MIL/uL — ABNORMAL LOW (ref 3.87–5.11)
RDW: 14 % (ref 11.5–15.5)
WBC Count: 3.2 10*3/uL — ABNORMAL LOW (ref 4.0–10.5)
nRBC: 0 % (ref 0.0–0.2)

## 2022-01-27 LAB — CMP (CANCER CENTER ONLY)
ALT: 22 U/L (ref 0–44)
AST: 17 U/L (ref 15–41)
Albumin: 4.3 g/dL (ref 3.5–5.0)
Alkaline Phosphatase: 48 U/L (ref 38–126)
Anion gap: 6 (ref 5–15)
BUN: 12 mg/dL (ref 6–20)
CO2: 30 mmol/L (ref 22–32)
Calcium: 9.5 mg/dL (ref 8.9–10.3)
Chloride: 101 mmol/L (ref 98–111)
Creatinine: 0.57 mg/dL (ref 0.44–1.00)
GFR, Estimated: >60 mL/min (ref >60)
Glucose, Bld: 94 mg/dL (ref 70–99)
Potassium: 4.1 mmol/L (ref 3.5–5.1)
Sodium: 137 mmol/L (ref 135–145)
Total Bilirubin: 0.4 mg/dL (ref 0.3–1.2)
Total Protein: 7.3 g/dL (ref 6.5–8.1)

## 2022-01-27 MED ORDER — HEPARIN SOD (PORK) LOCK FLUSH 100 UNIT/ML IV SOLN
500.0000 [IU] | Freq: Once | INTRAVENOUS | Status: AC | PRN
  Administered 2022-01-27: 500 [IU]

## 2022-01-27 MED ORDER — SODIUM CHLORIDE 0.9 % IV SOLN: Freq: Once | INTRAVENOUS | Status: AC

## 2022-01-27 MED ORDER — FAMOTIDINE IN NACL 20-0.9 MG/50ML-% IV SOLN
20.0000 mg | Freq: Once | INTRAVENOUS | Status: AC
  Administered 2022-01-27: 20 mg via INTRAVENOUS
  Filled 2022-01-27: qty 50

## 2022-01-27 MED ORDER — SODIUM CHLORIDE 0.9 % IV SOLN
10.0000 mg | Freq: Once | INTRAVENOUS | Status: AC
  Administered 2022-01-27: 10 mg via INTRAVENOUS
  Filled 2022-01-27: qty 10

## 2022-01-27 MED ORDER — SODIUM CHLORIDE 0.9% FLUSH
10.0000 mL | Freq: Once | INTRAVENOUS | Status: AC
  Administered 2022-01-27: 10 mL

## 2022-01-27 MED ORDER — SODIUM CHLORIDE 0.9 % IV SOLN
80.0000 mg/m2 | Freq: Once | INTRAVENOUS | Status: AC
  Administered 2022-01-27: 150 mg via INTRAVENOUS
  Filled 2022-01-27: qty 25

## 2022-01-27 MED ORDER — SODIUM CHLORIDE 0.9% FLUSH
10.0000 mL | INTRAVENOUS | Status: DC | PRN
  Administered 2022-01-27: 10 mL

## 2022-01-27 MED ORDER — DIPHENHYDRAMINE HCL 50 MG/ML IJ SOLN
12.5000 mg | Freq: Once | INTRAMUSCULAR | Status: AC
  Administered 2022-01-27: 12.5 mg via INTRAVENOUS
  Filled 2022-01-27: qty 1

## 2022-01-27 MED ORDER — PALONOSETRON HCL INJECTION 0.25 MG/5ML
0.2500 mg | Freq: Once | INTRAVENOUS | Status: AC
  Administered 2022-01-27: 0.25 mg via INTRAVENOUS
  Filled 2022-01-27: qty 5

## 2022-01-27 NOTE — Patient Instructions (Signed)
[facility]  Discharge Instructions: ?Thank you for choosing [facility] to provide your oncology and hematology care.  ? ?If you have a lab appointment with the [facility], please go directly to the [facility] and check in at the registration area. ?  ?Wear comfortable clothing and clothing appropriate for easy access to any Portacath or PICC line.  ? ?We strive to give you quality time with your provider. You may need to reschedule your appointment if you arrive late (15 or more minutes).  Arriving late affects you and other patients whose appointments are after yours.  Also, if you miss three or more appointments without notifying the office, you may be dismissed from the clinic at the provider?s discretion.    ?  ?For prescription refill requests, have your pharmacy contact our office and allow 72 hours for refills to be completed.   ? ?Today you received the following chemotherapy and/or immunotherapy agents: Paclitaxel    ?  ?To help prevent nausea and vomiting after your treatment, we encourage you to take your nausea medication as directed. ? ?BELOW ARE SYMPTOMS THAT SHOULD BE REPORTED IMMEDIATELY: ?*FEVER GREATER THAN 100.4 F (38 ?C) OR HIGHER ?*CHILLS OR SWEATING ?*NAUSEA AND VOMITING THAT IS NOT CONTROLLED WITH YOUR NAUSEA MEDICATION ?*UNUSUAL SHORTNESS OF BREATH ?*UNUSUAL BRUISING OR BLEEDING ?*URINARY PROBLEMS (pain or burning when urinating, or frequent urination) ?*BOWEL PROBLEMS (unusual diarrhea, constipation, pain near the anus) ?TENDERNESS IN MOUTH AND THROAT WITH OR WITHOUT PRESENCE OF ULCERS (sore throat, sores in mouth, or a toothache) ?UNUSUAL RASH, SWELLING OR PAIN  ?UNUSUAL VAGINAL DISCHARGE OR ITCHING  ? ?Items with * indicate a potential emergency and should be followed up as soon as possible or go to the Emergency Department if any problems should occur. ? ?Please show the CHEMOTHERAPY ALERT CARD or IMMUNOTHERAPY ALERT CARD at check-in to  the Emergency Department and triage nurse. ? ?Should you have questions after your visit or need to cancel or reschedule your appointment, please contact [facility]  Dept: [phone]  and follow the prompts.  Office hours are 8:00 a.m. to 4:30 p.m. Monday - Friday. Please note that voicemails left after 4:00 p.m. may not be returned until the following business day.  We are closed weekends and major holidays. You have access to a nurse at all times for urgent questions. Please call the main number to the clinic Dept: [phone] and follow the prompts. ? ? ?For any non-urgent questions, you may also contact your provider using MyChart. We now offer e-Visits for anyone 42 and older to request care online for non-urgent symptoms. For details visit [email or web address]. ?  ?Also download the MyChart app! Go to the app store, search "MyChart", open the app, select [facility], and log in with your MyChart username and password. ? ?Due to Covid, a mask is required upon entering the hospital/clinic. If you do not have a mask, one will be given to you upon arrival. For doctor visits, patients may have 1 support person aged 31 or older with them. For treatment visits, patients cannot have anyone with them due to current Covid guidelines and our immunocompromised population.  ? ?

## 2022-01-28 DIAGNOSIS — F419 Anxiety disorder, unspecified: Secondary | ICD-10-CM | POA: Diagnosis not present

## 2022-01-28 DIAGNOSIS — G479 Sleep disorder, unspecified: Secondary | ICD-10-CM | POA: Diagnosis not present

## 2022-01-29 ENCOUNTER — Ambulatory Visit
Admission: RE | Admit: 2022-01-29 | Discharge: 2022-01-29 | Disposition: A | Discharge status: Home / Self Care | Payer: BC Managed Care – PPO | Source: Ambulatory Visit | Attending: Hematology and Oncology | Admitting: Hematology and Oncology

## 2022-01-29 DIAGNOSIS — G44209 Tension-type headache, unspecified, not intractable: Secondary | ICD-10-CM

## 2022-01-29 DIAGNOSIS — Z853 Personal history of malignant neoplasm of breast: Secondary | ICD-10-CM | POA: Diagnosis not present

## 2022-01-29 DIAGNOSIS — I639 Cerebral infarction, unspecified: Secondary | ICD-10-CM | POA: Diagnosis not present

## 2022-01-29 IMAGING — MR MR HEAD WO/W CM
13 series · 48 of 48 positions shown · IV contrast (multihance)
Comparison: None.

CLINICAL DATA: Chronic headache with no new features. History of
breast cancer, rule out metastatic disease.

EXAM:
MRI HEAD WITHOUT AND WITH CONTRAST
TECHNIQUE: Multiplanar, multiecho pulse sequences of the brain and surrounding
structures were obtained without and with intravenous contrast.
CONTRAST:  18mL MULTIHANCE GADOBENATE DIMEGLUMINE 529 MG/ML IV SOLN

[Series 2: T1 · sagittal · 5.0mm · 0.47mm/px · 1 of 26 slices shown]
[im 1/26]
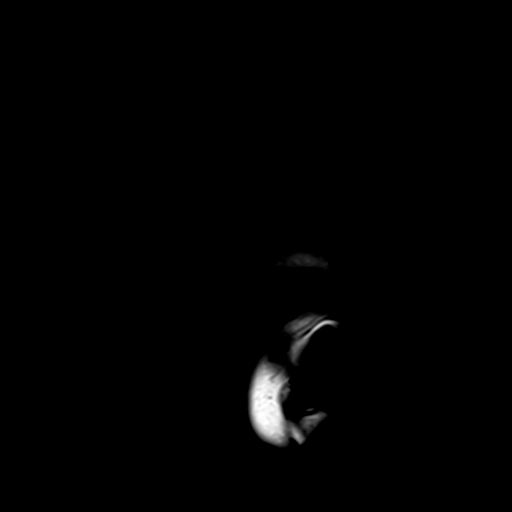

[Series 3: DWI · axial · 3.0mm · 1.80mm/px · z∈[-79,+72]mm · 6 of 102 slices shown (1 of 4)]
[im 1/102]
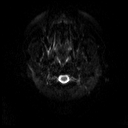
[im 21/102]
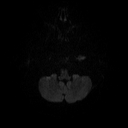
[im 41/102]
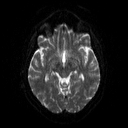
[im 61/102]
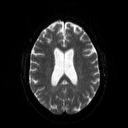
[im 81/102]
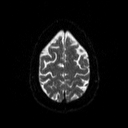
[im 102/102]
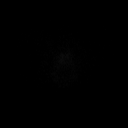

[Series 4: DWI · axial · 3.0mm · 1.80mm/px · z∈[-79,+72]mm · 3 of 52 slices shown (2 of 4)]
[im 1/52]
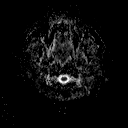
[im 26/52]
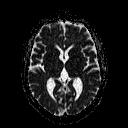
[im 52/52]
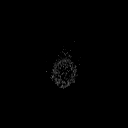

[Series 5: DWI · coronal · 5.0mm · 1.80mm/px · 5 of 78 slices shown (3 of 4)]
[im 1/78]
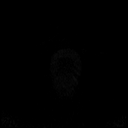
[im 20/78]
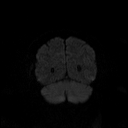
[im 39/78]
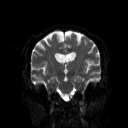
[im 58/78]
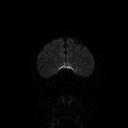
[im 78/78]
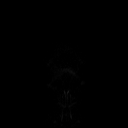

[Series 6: DWI · coronal · 5.0mm · 1.80mm/px · 2 of 39 slices shown (4 of 4)]
[im 1/39]
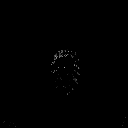
[im 39/39]
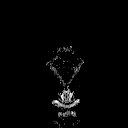

[Series 7: T2 · axial · 5.0mm · 0.60mm/px · 1 of 25 slices shown (1 of 2)]
[im 1/25]
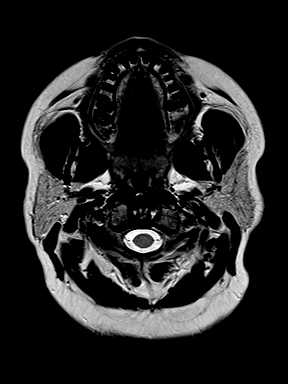

[Series 8: FLAIR · axial · 3.0mm · 0.49mm/px · z∈[-79,+77]mm · 2 of 35 slices shown]
[im 1/35]
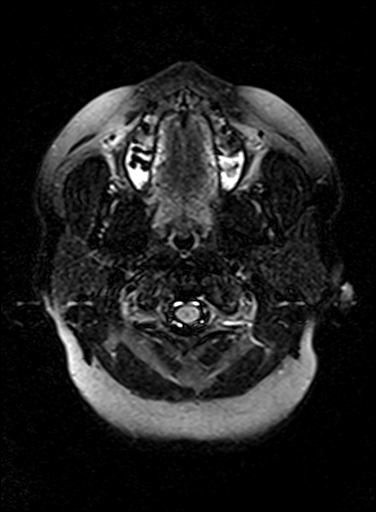
[im 35/35]
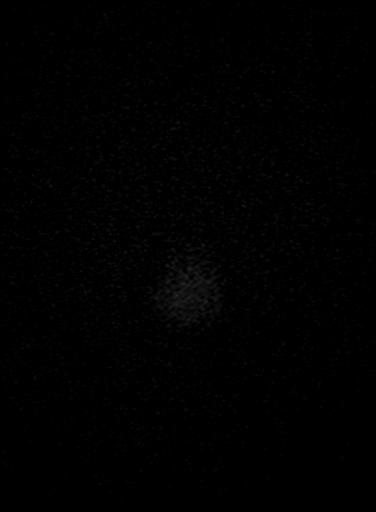

[Series 10: swi_images · axial · 4.0mm · 0.90mm/px · z∈[-79,+75]mm · 2 of 40 slices shown]
[im 1/40]
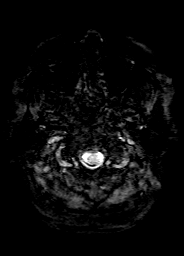
[im 40/40]
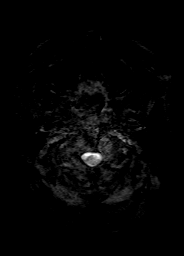

[Series 11: t1_mpr_tra · axial · 1.0mm · 0.78mm/px · z∈[-77,+80]mm · 10 of 160 slices shown]
[im 1/160]
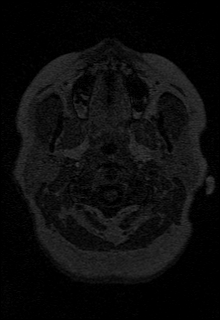
[im 18/160]
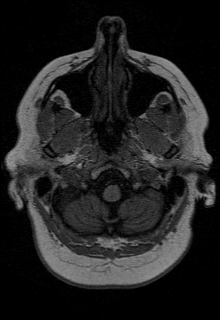
[im 36/160]
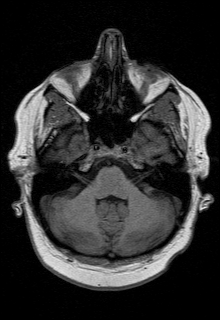
[im 54/160]
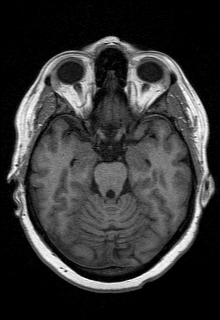
[im 71/160]
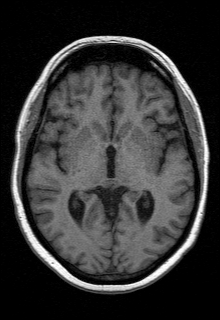
[im 89/160]
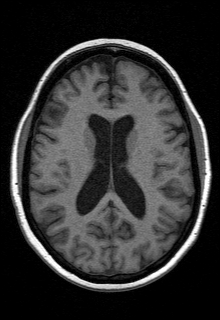
[im 107/160]
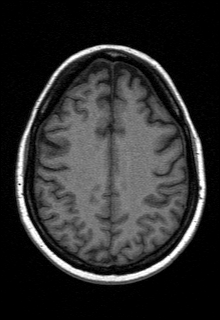
[im 124/160]
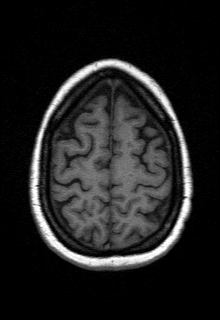
[im 142/160]
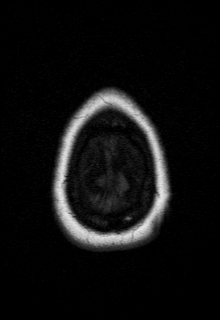
[im 160/160]
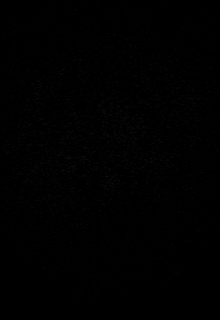

[Series 12: T2 · coronal · 5.0mm · 0.45mm/px · 2 of 32 slices shown (2 of 2)]
[im 1/32]
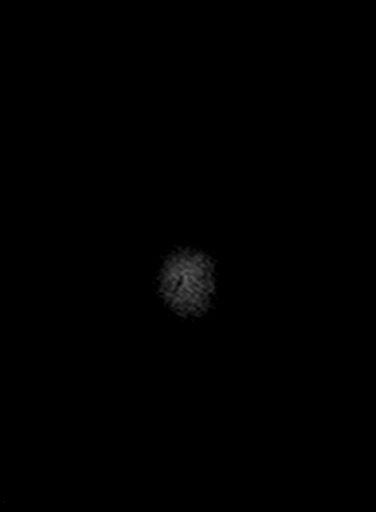
[im 32/32]
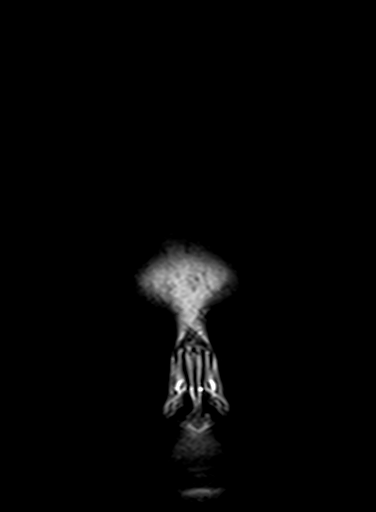

[Series 13: t1_mpr_tra post · axial · 1.0mm · 0.78mm/px · z∈[-77,+80]mm · 10 of 160 slices shown]
[im 1/160]
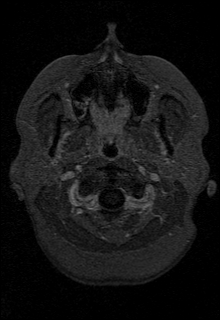
[im 18/160]
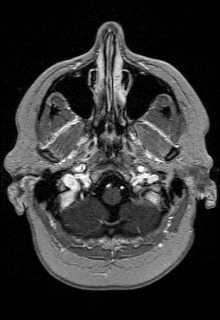
[im 36/160]
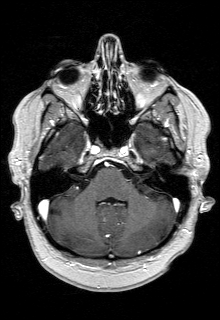
[im 54/160]
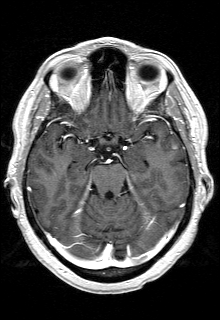
[im 71/160]
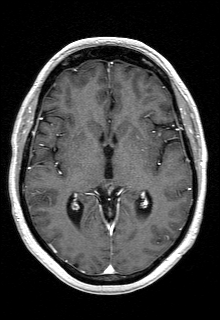
[im 89/160]
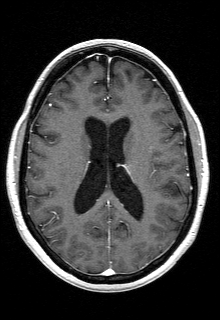
[im 107/160]
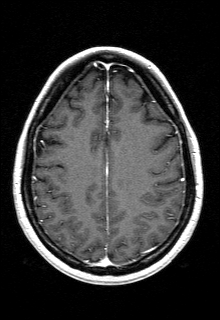
[im 124/160]
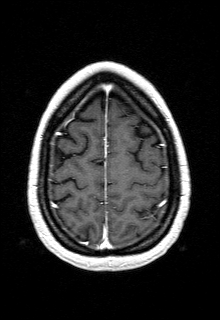
[im 142/160]
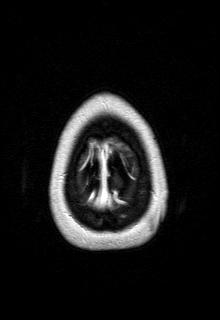
[im 160/160]
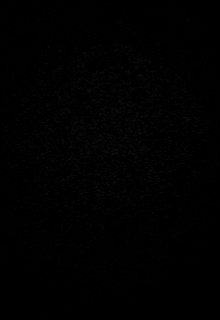

[Series 14: post cor · coronal · 5.0mm · 0.45mm/px · 2 of 32 slices shown]
[im 1/32]
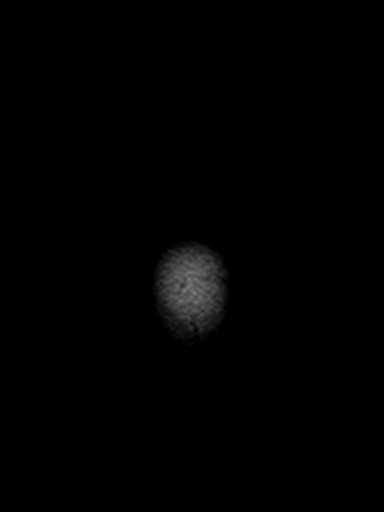
[im 32/32]
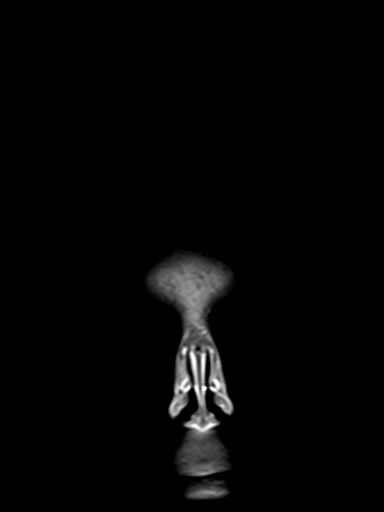

[Series 15: T1 post-contrast · sagittal · 5.0mm · 0.45mm/px · 2 of 27 slices shown]
[im 1/27]
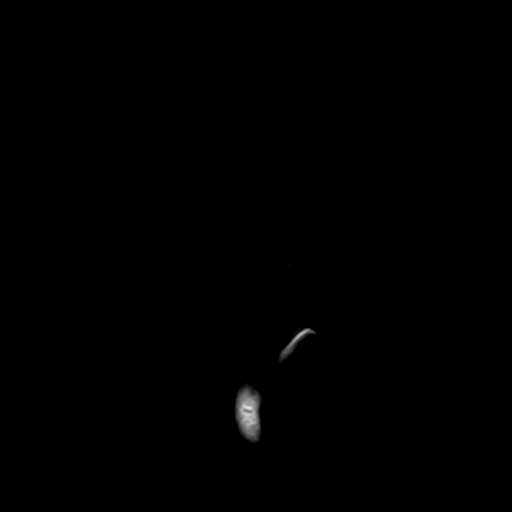
[im 27/27]
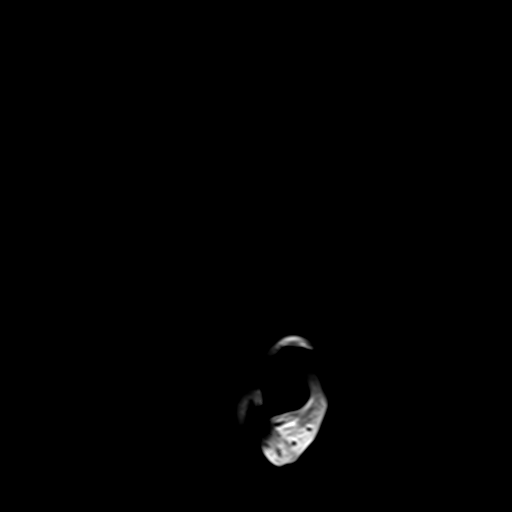

[48 of 48 positions shown; findings below may reference images not displayed]

FINDINGS: Brain: No acute infarction, hemorrhage, hydrocephalus, extra-axial
collection or mass lesion. No abnormal enhancement. Tiny remote
lateral right cerebellar infarct. Few FLAIR hyperintensities in the
cerebral white matter with nonspecific pattern, usually incidental
when seen to this limited degree.

Vascular: Major flow voids and vascular enhancements are preserved

Skull and upper cervical spine: Negative for marrow lesion.

Sinuses/Orbits: Negative
IMPRESSION: 1. Negative for metastatic disease.  No explanation for headache.
2. Small remote right cerebellar infarct.

## 2022-01-29 MED ORDER — GADOBENATE DIMEGLUMINE 529 MG/ML IV SOLN
18.0000 mL | Freq: Once | INTRAVENOUS | Status: AC | PRN
  Administered 2022-01-29: 18 mL via INTRAVENOUS

## 2022-02-04 ENCOUNTER — Inpatient Hospital Stay: Payer: BC Managed Care – PPO

## 2022-02-04 ENCOUNTER — Encounter: Payer: Self-pay | Admitting: Hematology and Oncology

## 2022-02-04 ENCOUNTER — Other Ambulatory Visit: Payer: Self-pay

## 2022-02-04 ENCOUNTER — Inpatient Hospital Stay (HOSPITAL_BASED_OUTPATIENT_CLINIC_OR_DEPARTMENT_OTHER): Payer: BC Managed Care – PPO | Admitting: Hematology and Oncology

## 2022-02-04 ENCOUNTER — Encounter: Payer: Self-pay | Admitting: *Deleted

## 2022-02-04 VITALS — BP 118/67 | HR 86 | Temp 98.8°F | Resp 16 | Ht 64.0 in | Wt 198.6 lb

## 2022-02-04 DIAGNOSIS — C50411 Malignant neoplasm of upper-outer quadrant of right female breast: Secondary | ICD-10-CM | POA: Diagnosis not present

## 2022-02-04 DIAGNOSIS — Z9011 Acquired absence of right breast and nipple: Secondary | ICD-10-CM | POA: Diagnosis not present

## 2022-02-04 DIAGNOSIS — Z803 Family history of malignant neoplasm of breast: Secondary | ICD-10-CM | POA: Diagnosis not present

## 2022-02-04 DIAGNOSIS — E785 Hyperlipidemia, unspecified: Secondary | ICD-10-CM | POA: Diagnosis not present

## 2022-02-04 DIAGNOSIS — Z5111 Encounter for antineoplastic chemotherapy: Secondary | ICD-10-CM | POA: Diagnosis not present

## 2022-02-04 DIAGNOSIS — Z170 Estrogen receptor positive status [ER+]: Secondary | ICD-10-CM

## 2022-02-04 DIAGNOSIS — Z79899 Other long term (current) drug therapy: Secondary | ICD-10-CM | POA: Diagnosis not present

## 2022-02-04 DIAGNOSIS — Z808 Family history of malignant neoplasm of other organs or systems: Secondary | ICD-10-CM | POA: Diagnosis not present

## 2022-02-04 DIAGNOSIS — Z171 Estrogen receptor negative status [ER-]: Secondary | ICD-10-CM | POA: Diagnosis not present

## 2022-02-04 DIAGNOSIS — I639 Cerebral infarction, unspecified: Secondary | ICD-10-CM | POA: Diagnosis not present

## 2022-02-04 DIAGNOSIS — Z95828 Presence of other vascular implants and grafts: Secondary | ICD-10-CM

## 2022-02-04 DIAGNOSIS — Z8042 Family history of malignant neoplasm of prostate: Secondary | ICD-10-CM | POA: Diagnosis not present

## 2022-02-04 LAB — CMP (CANCER CENTER ONLY)
ALT: 28 U/L (ref 0–44)
AST: 28 U/L (ref 15–41)
Albumin: 4.3 g/dL (ref 3.5–5.0)
Alkaline Phosphatase: 52 U/L (ref 38–126)
Anion gap: 7 (ref 5–15)
BUN: 9 mg/dL (ref 6–20)
CO2: 28 mmol/L (ref 22–32)
Calcium: 9.3 mg/dL (ref 8.9–10.3)
Chloride: 101 mmol/L (ref 98–111)
Creatinine: 0.7 mg/dL (ref 0.44–1.00)
GFR, Estimated: >60 mL/min (ref >60)
Glucose, Bld: 99 mg/dL (ref 70–99)
Potassium: 3.8 mmol/L (ref 3.5–5.1)
Sodium: 136 mmol/L (ref 135–145)
Total Bilirubin: 0.4 mg/dL (ref 0.3–1.2)
Total Protein: 7.4 g/dL (ref 6.5–8.1)

## 2022-02-04 LAB — CBC WITH DIFFERENTIAL (CANCER CENTER ONLY)
Abs Immature Granulocytes: 0.03 10*3/uL (ref 0.00–0.07)
Basophils Absolute: 0 10*3/uL (ref 0.0–0.1)
Basophils Relative: 1 %
Eosinophils Absolute: 0 10*3/uL (ref 0.0–0.5)
Eosinophils Relative: 1 %
HCT: 30.2 % — ABNORMAL LOW (ref 36.0–46.0)
Hemoglobin: 10.5 g/dL — ABNORMAL LOW (ref 12.0–15.0)
Immature Granulocytes: 1 %
Lymphocytes Relative: 32 %
Lymphs Abs: 1 10*3/uL (ref 0.7–4.0)
MCH: 35.2 pg — ABNORMAL HIGH (ref 26.0–34.0)
MCHC: 34.8 g/dL (ref 30.0–36.0)
MCV: 101.3 fL — ABNORMAL HIGH (ref 80.0–100.0)
Monocytes Absolute: 0.6 10*3/uL (ref 0.1–1.0)
Monocytes Relative: 20 %
Neutro Abs: 1.5 10*3/uL — ABNORMAL LOW (ref 1.7–7.7)
Neutrophils Relative %: 45 %
Platelet Count: 200 10*3/uL (ref 150–400)
RBC: 2.98 MIL/uL — ABNORMAL LOW (ref 3.87–5.11)
RDW: 14.7 % (ref 11.5–15.5)
WBC Count: 3.3 10*3/uL — ABNORMAL LOW (ref 4.0–10.5)
nRBC: 0.9 % — ABNORMAL HIGH (ref 0.0–0.2)

## 2022-02-04 MED ORDER — SODIUM CHLORIDE 0.9 % IV SOLN
10.0000 mg | Freq: Once | INTRAVENOUS | Status: AC
  Administered 2022-02-04: 10 mg via INTRAVENOUS
  Filled 2022-02-04: qty 10

## 2022-02-04 MED ORDER — SODIUM CHLORIDE 0.9 % IV SOLN
80.0000 mg/m2 | Freq: Once | INTRAVENOUS | Status: AC
  Administered 2022-02-04: 150 mg via INTRAVENOUS
  Filled 2022-02-04: qty 25

## 2022-02-04 MED ORDER — PALONOSETRON HCL INJECTION 0.25 MG/5ML
0.2500 mg | Freq: Once | INTRAVENOUS | Status: AC
  Administered 2022-02-04: 0.25 mg via INTRAVENOUS
  Filled 2022-02-04: qty 5

## 2022-02-04 MED ORDER — SODIUM CHLORIDE 0.9 % IV SOLN: Freq: Once | INTRAVENOUS | Status: AC

## 2022-02-04 MED ORDER — HEPARIN SOD (PORK) LOCK FLUSH 100 UNIT/ML IV SOLN
500.0000 [IU] | Freq: Once | INTRAVENOUS | Status: AC | PRN
  Administered 2022-02-04: 500 [IU]

## 2022-02-04 MED ORDER — SODIUM CHLORIDE 0.9% FLUSH
10.0000 mL | Freq: Once | INTRAVENOUS | Status: AC
  Administered 2022-02-04: 10 mL

## 2022-02-04 MED ORDER — SODIUM CHLORIDE 0.9% FLUSH
10.0000 mL | INTRAVENOUS | Status: DC | PRN
  Administered 2022-02-04: 10 mL

## 2022-02-04 MED ORDER — DIPHENHYDRAMINE HCL 50 MG/ML IJ SOLN
12.5000 mg | Freq: Once | INTRAMUSCULAR | Status: AC
  Administered 2022-02-04: 12.5 mg via INTRAVENOUS
  Filled 2022-02-04: qty 1

## 2022-02-04 MED ORDER — FAMOTIDINE IN NACL 20-0.9 MG/50ML-% IV SOLN
20.0000 mg | Freq: Once | INTRAVENOUS | Status: AC
  Administered 2022-02-04: 20 mg via INTRAVENOUS
  Filled 2022-02-04: qty 50

## 2022-02-04 NOTE — Patient Instructions (Signed)
[facility]  Discharge Instructions: ?Thank you for choosing [facility] to provide your oncology and hematology care.  ? ?If you have a lab appointment with the [facility], please go directly to the [facility] and check in at the registration area. ?  ?Wear comfortable clothing and clothing appropriate for easy access to any Portacath or PICC line.  ? ?We strive to give you quality time with your provider. You may need to reschedule your appointment if you arrive late (15 or more minutes).  Arriving late affects you and other patients whose appointments are after yours.  Also, if you miss three or more appointments without notifying the office, you may be dismissed from the clinic at the provider?s discretion.    ?  ?For prescription refill requests, have your pharmacy contact our office and allow 72 hours for refills to be completed.   ? ?Today you received the following chemotherapy and/or immunotherapy agents: Paclitaxel    ?  ?To help prevent nausea and vomiting after your treatment, we encourage you to take your nausea medication as directed. ? ?BELOW ARE SYMPTOMS THAT SHOULD BE REPORTED IMMEDIATELY: ?*FEVER GREATER THAN 100.4 F (38 ?C) OR HIGHER ?*CHILLS OR SWEATING ?*NAUSEA AND VOMITING THAT IS NOT CONTROLLED WITH YOUR NAUSEA MEDICATION ?*UNUSUAL SHORTNESS OF BREATH ?*UNUSUAL BRUISING OR BLEEDING ?*URINARY PROBLEMS (pain or burning when urinating, or frequent urination) ?*BOWEL PROBLEMS (unusual diarrhea, constipation, pain near the anus) ?TENDERNESS IN MOUTH AND THROAT WITH OR WITHOUT PRESENCE OF ULCERS (sore throat, sores in mouth, or a toothache) ?UNUSUAL RASH, SWELLING OR PAIN  ?UNUSUAL VAGINAL DISCHARGE OR ITCHING  ? ?Items with * indicate a potential emergency and should be followed up as soon as possible or go to the Emergency Department if any problems should occur. ? ?Please show the CHEMOTHERAPY ALERT CARD or IMMUNOTHERAPY ALERT CARD at check-in to  the Emergency Department and triage nurse. ? ?Should you have questions after your visit or need to cancel or reschedule your appointment, please contact [facility]  Dept: [phone]  and follow the prompts.  Office hours are 8:00 a.m. to 4:30 p.m. Monday - Friday. Please note that voicemails left after 4:00 p.m. may not be returned until the following business day.  We are closed weekends and major holidays. You have access to a nurse at all times for urgent questions. Please call the main number to the clinic Dept: [phone] and follow the prompts. ? ? ?For any non-urgent questions, you may also contact your provider using MyChart. We now offer e-Visits for anyone 33 and older to request care online for non-urgent symptoms. For details visit [email or web address]. ?  ?Also download the MyChart app! Go to the app store, search "MyChart", open the app, select [facility], and log in with your MyChart username and password. ? ?Due to Covid, a mask is required upon entering the hospital/clinic. If you do not have a mask, one will be given to you upon arrival. For doctor visits, patients may have 1 support person aged 28 or older with them. For treatment visits, patients cannot have anyone with them due to current Covid guidelines and our immunocompromised population.  ? ?

## 2022-02-04 NOTE — Progress Notes (Signed)
? ?Patient Care Team: ?Crecencio Mc, MD as PCP - General (Internal Medicine) ?Mauro Kaufmann, RN as Oncology Nurse Navigator ?Rockwell Germany, RN as Oncology Nurse Navigator ?Jovita Kussmaul, MD as Consulting Physician (General Surgery) ?Nicholas Lose, MD as Consulting Physician (Hematology and Oncology) ?Eppie Gibson, MD as Attending Physician (Radiation Oncology) ? ?DIAGNOSIS:  ?Encounter Diagnosis  ?Name Primary?  ? Malignant neoplasm of upper-outer quadrant of right breast in female, estrogen receptor positive ([place]) Yes  ? ? ? ?SUMMARY OF ONCOLOGIC HISTORY: ?Oncology History  ?Malignant neoplasm of upper-outer quadrant of right breast in female, estrogen receptor negative ([place])  ?07/08/2021 Initial Diagnosis  ? Palpable right breast lump. Diagnostic mammogram: showed highly suspicious right breast mass and an indeterminate single right axillary lymph node with up to 5 mm diffuse cortical thickening. Biopsy: grade 2-3 invasive ductal carcinoma, DCIS, and right axillary lymph node negative for metastatic carcinoma; ER+(80%)/PR-/Her2-. ?  ?07/16/2021 Cancer Staging  ? Staging form: Breast, AJCC 8th Edition ?- Clinical stage from 07/16/2021: Stage IIB (cT2, cN0, cM0, G3, ER+, PR-, HER2-) - Signed by Nicholas Lose, MD on 07/16/2021 ?Stage prefix: Initial diagnosis ?Histologic grading system: 3 grade system ? ?  ?08/18/2021 Surgery  ? Right mastectomy: Grade 3 IDC 2.8 cm with high-grade DCIS, margins negative, 0/3 lymph nodes negative, ER 0%, PR 0%, HER2 negative, Ki-67 60% (triple negative) ?  ?09/22/2021 -  Chemotherapy  ? Patient is on Treatment Plan : BREAST Dose Dense AC q14d / CARBOplatin D1 + PACLitaxel D1,8,15 q21d  ?   ?10/06/2021 Genetic Testing  ? Negative hereditary cancer genetic testing: no pathogenic variants detected in [facility] CancerNext-Expanded +RNAinsight Panel.  The report date is 10/06/2021.  ? ?The CancerNext-Expanded gene panel offered by [facility] and includes sequencing, rearrangement,  and RNA analysis for the following 77 genes: AIP, ALK, APC, ATM, AXIN2, BAP1, BARD1, BLM, BMPR1A, BRCA1, BRCA2, BRIP1, CDC73, CDH1, CDK4, CDKN1B, CDKN2A, CHEK2, CTNNA1, DICER1, FANCC, FH, FLCN, GALNT12, KIF1B, LZTR1, MAX, MEN1, MET, MLH1, MSH2, MSH3, MSH6, MUTYH, NBN, NF1, NF2, NTHL1, PALB2, PHOX2B, PMS2, POT1, PRKAR1A, PTCH1, PTEN, RAD51C, RAD51D, RB1, RECQL, RET, SDHA, SDHAF2, SDHB, SDHC, SDHD, SMAD4, SMARCA4, SMARCB1, SMARCE1, STK11, SUFU, TMEM127, TP53, TSC1, TSC2, VHL and XRCC2 (sequencing and deletion/duplication); EGFR, EGLN1, HOXB13, KIT, MITF, PDGFRA, POLD1, and POLE (sequencing only); EPCAM and GREM1 (deletion/duplication only).  ?  ? ? ?CHIEF COMPLIANT: Cycle 4 Taxol (carboplatin every 3 weeks) ?  ?  ? ?INTERVAL HISTORY: Katelyn Hobbs is a 35 ?y.o. with above-mentioned history of triple negative breast cancer who is currently on adjuvant treatment with taxol. ?She is excited about completing chemotherapy today.  She feels well to proceed with chemotherapy.  No new complaints.  She noticed intermittent constipation, resolved.  No neuropathy described today. ?Rest of the pertinent 10 point ROS reviewed and negative.   ?ALLERGIES:  is allergic to benadryl [diphenhydramine], latex, and phenergan [promethazine hcl]. ? ?MEDICATIONS:  ?Current Outpatient Medications  ?Medication Sig Dispense Refill  ? acetaminophen (TYLENOL) 500 MG tablet Take 1,000 mg by mouth every 8 (eight) hours as needed for moderate pain.    ? ALPRAZolam (XANAX XR) 2 MG 24 hr tablet TAKE 1 TABLET BY MOUTH EVERYDAY AT BEDTIME 30 tablet 2  ? amoxicillin (AMOXIL) 500 MG capsule Take 1 capsule (500 mg total) by mouth 2 (two) times daily. 14 capsule 0  ? cetirizine (ZYRTEC) 10 MG tablet Take 10 mg by mouth at bedtime.    ? citalopram (CELEXA) 40 MG tablet Take 1  tablet (40 mg total) by mouth daily. 90 tablet 1  ? ergocalciferol (DRISDOL) 1.25 MG (50000 UT) capsule Take 1 capsule (50,000 Units total) by mouth once a week. 12 capsule 0  ?  ibuprofen (ADVIL) 200 MG tablet Take 800 mg by mouth every 8 (eight) hours as needed for moderate pain.    ? lidocaine-prilocaine (EMLA) cream Apply to affected area once 30 g 3  ? LORazepam (ATIVAN) 0.5 MG tablet TAKE 1 TABLET (0.5 MG TOTAL) BY MOUTH AT BEDTIME AS NEEDED FOR SLEEP. 30 tablet 0  ? magic mouthwash w/lidocaine SOLN Take 5 mLs by mouth 3 (three) times daily as needed for mouth pain. 240 mL 1  ? ondansetron (ZOFRAN) 8 MG tablet TAKE 1 TABLET BY MOUTH 2 (TWO) TIMES DAILY AS NEEDED. START ON THE THIRD DAY AFTER CHEMOTHERAPY. 30 tablet 1  ? prochlorperazine (COMPAZINE) 10 MG tablet TAKE 1 TABLET (10 MG TOTAL) BY MOUTH EVERY 6 (SIX) HOURS AS NEEDED (NAUSEA OR VOMITING). 30 tablet 1  ? ?No current facility-administered medications for this visit.  ? ? ?PHYSICAL EXAMINATION: ?ECOG PERFORMANCE STATUS: 1 - Symptomatic but completely ambulatory ? ?Vitals:  ? 02/04/22 0855  ?BP: 118/67  ?Pulse: 86  ?Resp: 16  ?Temp: 98.8 ?F (37.1 ?C)  ?SpO2: 100%  ? ?Filed Weights  ? 02/04/22 0855  ?Weight: 198 lb 9.6 oz (90.1 kg)  ? ?  ?Physical Exam ?Constitutional:   ?   Appearance: Normal appearance.  ?Cardiovascular:  ?   Rate and Rhythm: Normal rate and regular rhythm.  ?   Pulses: Normal pulses.  ?   Heart sounds: Normal heart sounds.  ?Musculoskeletal:     ?   General: No swelling.  ?   Cervical back: Normal range of motion and neck supple. No rigidity.  ?Lymphadenopathy:  ?   Cervical: No cervical adenopathy.  ?Skin: ?   General: Skin is warm and dry.  ?Neurological:  ?   General: No focal deficit present.  ?   Mental Status: She is alert.  ?Psychiatric:     ?   Mood and Affect: Mood normal.  ? ? ?LABORATORY DATA:  ?I have reviewed the data as listed ? ?  Latest Ref Rng & Units 01/27/2022  ? 10:27 AM 01/20/2022  ?  9:35 AM 01/13/2022  ?  9:23 AM  ?CMP  ?Glucose 70 - 99 mg/dL 94   102   103    ?BUN 6 - 20 mg/dL '12   12   17    ' ?Creatinine 0.44 - 1.00 mg/dL 0.57   0.64   0.59    ?Sodium 135 - 145 mmol/L 137   139   136     ?Potassium 3.5 - 5.1 mmol/L 4.1   3.7   4.0    ?Chloride 98 - 111 mmol/L 101   104   102    ?CO2 22 - 32 mmol/L '30   29   29    ' ?Calcium 8.9 - 10.3 mg/dL 9.5   9.4   9.4    ?Total Protein 6.5 - 8.1 g/dL 7.3   6.9   7.0    ?Total Bilirubin 0.3 - 1.2 mg/dL 0.4   0.3   0.3    ?Alkaline Phos 38 - 126 U/L 48   58   53    ?AST 15 - 41 U/L '17   16   14    ' ?ALT 0 - 44 U/L '22   20   17    ' ? ? ?  Lab Results  ?Component Value Date  ? WBC 3.3 (L) 02/04/2022  ? HGB 10.5 (L) 02/04/2022  ? HCT 30.2 (L) 02/04/2022  ? MCV 101.3 (H) 02/04/2022  ? PLT 200 02/04/2022  ? NEUTROABS 1.5 (L) 02/04/2022  ? ? ?ASSESSMENT & PLAN:  ?Malignant neoplasm of upper-outer quadrant of right breast in female, estrogen receptor negative ([place]) ?07/08/2021: Palpable right breast lump. Diagnostic mammogram: showed highly suspicious right breast mass and an indeterminate single right axillary lymph node with up to 5 mm diffuse cortical thickening. Biopsy: grade 2-3 invasive ductal carcinoma, DCIS, and right axillary lymph node negative for metastatic carcinoma; ER+(80%)/PR-/Her2-. ?  ?On the final pathology her estrogen receptor is negative and therefore patient has triple negative breast cancer ?  ?Recommendations: ?1. Right mastectomy: Grade 3 IDC 2.8 cm with high-grade DCIS, margins negative, 0/3 lymph nodes negative, ER 0%, PR 0%, HER2 negative, Ki-67 40% (final pathology is triple negative disease) ?2. adjuvant chemotherapy with dose dense Adriamycin and Cytoxan followed by Taxol and carboplatin ?3. Genetic testing ?-------------------------------------------------------------------------------------------------- ?Current treatment: Completed 4 cycles of dose dense Adriamycin and Cytoxan, today is cycle 7 Taxol with carboplatin (every 3 weeks) ?  ?She denies any new health complaints.  Physical examination without any significant concerns.  Okay to proceed with chemotherapy if CMP is within parameters.  CBC reviewed and within parameters. ?She has  leukopenia secondary to chemotherapy, ANC of 1500, neutropenic precautions advised. ?Anemia secondary to chemotherapy, no transfusion indicated. ?With regards to the remote cerebellar infarct mentioned on the MRI

## 2022-02-11 ENCOUNTER — Encounter: Payer: Self-pay | Admitting: Hematology and Oncology

## 2022-02-11 ENCOUNTER — Telehealth: Payer: Self-pay

## 2022-02-11 ENCOUNTER — Other Ambulatory Visit: Payer: Self-pay | Admitting: *Deleted

## 2022-02-11 DIAGNOSIS — C50411 Malignant neoplasm of upper-outer quadrant of right female breast: Secondary | ICD-10-CM

## 2022-02-11 DIAGNOSIS — F5101 Primary insomnia: Secondary | ICD-10-CM

## 2022-02-11 MED ORDER — BIMATOPROST 0.03 % EX SOLN: 1.0000 "application " | Freq: Every day | CUTANEOUS | 12 refills | Status: AC

## 2022-02-11 NOTE — Telephone Encounter (Signed)
Spoke with pt regarding MRI brain per NP. Explained what impression 2 meant and that this does not indicate metastasis or malignancy. Asked pt if she is willing to consult with Dr Mickeal Skinner to have him review and speak with her. Pt is in agreement. Schedule message sent to scheduling and his nurse.  ?

## 2022-02-12 ENCOUNTER — Telehealth: Payer: Self-pay | Admitting: Internal Medicine

## 2022-02-12 NOTE — Telephone Encounter (Signed)
.  Called patient to schedule appointment per 4/19 inbasket, patient is aware of date and time.   ?

## 2022-02-16 ENCOUNTER — Other Ambulatory Visit: Payer: Self-pay

## 2022-02-16 ENCOUNTER — Ambulatory Visit: Payer: BC Managed Care – PPO | Admitting: Physical Therapy

## 2022-02-16 ENCOUNTER — Inpatient Hospital Stay (HOSPITAL_BASED_OUTPATIENT_CLINIC_OR_DEPARTMENT_OTHER): Payer: BC Managed Care – PPO | Admitting: Internal Medicine

## 2022-02-16 DIAGNOSIS — E785 Hyperlipidemia, unspecified: Secondary | ICD-10-CM | POA: Diagnosis not present

## 2022-02-16 DIAGNOSIS — C50411 Malignant neoplasm of upper-outer quadrant of right female breast: Secondary | ICD-10-CM | POA: Diagnosis not present

## 2022-02-16 DIAGNOSIS — Z5111 Encounter for antineoplastic chemotherapy: Secondary | ICD-10-CM | POA: Diagnosis not present

## 2022-02-16 DIAGNOSIS — Z8673 Personal history of transient ischemic attack (TIA), and cerebral infarction without residual deficits: Secondary | ICD-10-CM | POA: Insufficient documentation

## 2022-02-16 DIAGNOSIS — I639 Cerebral infarction, unspecified: Secondary | ICD-10-CM

## 2022-02-16 DIAGNOSIS — Z808 Family history of malignant neoplasm of other organs or systems: Secondary | ICD-10-CM | POA: Diagnosis not present

## 2022-02-16 DIAGNOSIS — Z79899 Other long term (current) drug therapy: Secondary | ICD-10-CM | POA: Diagnosis not present

## 2022-02-16 DIAGNOSIS — Z171 Estrogen receptor negative status [ER-]: Secondary | ICD-10-CM | POA: Diagnosis not present

## 2022-02-16 DIAGNOSIS — Z8042 Family history of malignant neoplasm of prostate: Secondary | ICD-10-CM | POA: Diagnosis not present

## 2022-02-16 DIAGNOSIS — Z9011 Acquired absence of right breast and nipple: Secondary | ICD-10-CM | POA: Diagnosis not present

## 2022-02-16 DIAGNOSIS — Z803 Family history of malignant neoplasm of breast: Secondary | ICD-10-CM | POA: Diagnosis not present

## 2022-02-16 NOTE — Progress Notes (Signed)
? ?[facility] [address]  ?[place], [place] [address] ?[phone] ? ? ?New Patient Evaluation ? ?Date of Service: 02/16/22 ?Patient Name: Katelyn Hobbs ?Patient MRN: [id number] ?Patient DOB: 1975/11/23 ?Provider: Ventura Sellers, MD ? ?Identifying Statement:  ?Katelyn Hobbs is a 46 y.o. female with Cerebrovascular accident (CVA), unspecified mechanism ([place]) who presents for initial consultation and evaluation regarding cancer associated neurologic deficits.   ? ?Referring Provider: ?Crecencio Mc, MD ?[address] ?[address] ?[place],   [id number] ? ?Primary Cancer: ? ?Oncologic History: ?Oncology History  ?Malignant neoplasm of upper-outer quadrant of right breast in female, estrogen receptor negative ([place])  ?07/08/2021 Initial Diagnosis  ? Palpable right breast lump. Diagnostic mammogram: showed highly suspicious right breast mass and an indeterminate single right axillary lymph node with up to 5 mm diffuse cortical thickening. Biopsy: grade 2-3 invasive ductal carcinoma, DCIS, and right axillary lymph node negative for metastatic carcinoma; ER+(80%)/PR-/Her2-. ?  ?07/16/2021 Cancer Staging  ? Staging form: Breast, AJCC 8th Edition ?- Clinical stage from 07/16/2021: Stage IIB (cT2, cN0, cM0, G3, ER+, PR-, HER2-) - Signed by Nicholas Lose, MD on 07/16/2021 ?Stage prefix: Initial diagnosis ?Histologic grading system: 3 grade system ? ?  ?08/18/2021 Surgery  ? Right mastectomy: Grade 3 IDC 2.8 cm with high-grade DCIS, margins negative, 0/3 lymph nodes negative, ER 0%, PR 0%, HER2 negative, Ki-67 60% (triple negative) ?  ?09/22/2021 -  Chemotherapy  ? Patient is on Treatment Plan : BREAST Dose Dense AC q14d / CARBOplatin D1 + PACLitaxel D1,8,15 q21d  ? ?  ?  ?10/06/2021 Genetic Testing  ? Negative hereditary cancer genetic testing: no pathogenic variants detected in [facility] CancerNext-Expanded +RNAinsight Panel.  The report date is 10/06/2021.  ? ?The  CancerNext-Expanded gene panel offered by [facility] and includes sequencing, rearrangement, and RNA analysis for the following 77 genes: AIP, ALK, APC, ATM, AXIN2, BAP1, BARD1, BLM, BMPR1A, BRCA1, BRCA2, BRIP1, CDC73, CDH1, CDK4, CDKN1B, CDKN2A, CHEK2, CTNNA1, DICER1, FANCC, FH, FLCN, GALNT12, KIF1B, LZTR1, MAX, MEN1, MET, MLH1, MSH2, MSH3, MSH6, MUTYH, NBN, NF1, NF2, NTHL1, PALB2, PHOX2B, PMS2, POT1, PRKAR1A, PTCH1, PTEN, RAD51C, RAD51D, RB1, RECQL, RET, SDHA, SDHAF2, SDHB, SDHC, SDHD, SMAD4, SMARCA4, SMARCB1, SMARCE1, STK11, SUFU, TMEM127, TP53, TSC1, TSC2, VHL and XRCC2 (sequencing and deletion/duplication); EGFR, EGLN1, HOXB13, KIT, MITF, PDGFRA, POLD1, and POLE (sequencing only); EPCAM and GREM1 (deletion/duplication only).  ?  ? ? ?History of Present Illness: ?The patient's records from the referring physician were obtained and reviewed and the patient interviewed to confirm this HPI.  SOLITA MACADAM presents today for evaluation after recent MRI findings.  She does acknowledge some history of headaches, which prompted the MRI study from Dr. Lindi Adie.  Currently these are not a significant problem for her.  She does describe very poor sleep, one episode of loss of consciousness 5 years ago. ? ?Medications: ?Current Outpatient Medications on File Prior to Visit  ?Medication Sig Dispense Refill  ? acetaminophen (TYLENOL) 500 MG tablet Take 1,000 mg by mouth every 8 (eight) hours as needed for moderate pain.    ? ALPRAZolam (XANAX XR) 2 MG 24 hr tablet TAKE 1 TABLET BY MOUTH EVERYDAY AT BEDTIME 30 tablet 2  ? bimatoprost (LATISSE) 0.03 % ophthalmic solution Place 1 application. into both eyes at bedtime. Apply evenly along the skin of the upper eyelid at base of eyelashes once daily at bedtime; repeat procedure for second eye (use a clean applicator). 3 mL 12  ?  cetirizine (ZYRTEC) 10 MG tablet Take 10 mg by mouth at bedtime.    ? ergocalciferol (DRISDOL) 1.25 MG (50000 UT) capsule Take 1 capsule (50,000  Units total) by mouth once a week. 12 capsule 0  ? ibuprofen (ADVIL) 200 MG tablet Take 800 mg by mouth every 8 (eight) hours as needed for moderate pain.    ? lidocaine-prilocaine (EMLA) cream Apply to affected area once 30 g 3  ? citalopram (CELEXA) 40 MG tablet Take 1 tablet (40 mg total) by mouth daily. 90 tablet 1  ? ?No current facility-administered medications on file prior to visit.  ? ? ?Allergies:  ?Allergies  ?Allergen Reactions  ? Benadryl [Diphenhydramine] Other (See Comments)  ?  Suspected elevated heart rate  ? Latex Itching  ? Phenergan [Promethazine Hcl] Other (See Comments)  ?  Suspected elevated heart rate after surgery  ? ?Past Medical History:  ?Past Medical History:  ?Diagnosis Date  ? Abnormal uterine bleeding   ? ADHD (attention deficit hyperactivity disorder)   ? Anemia   ? Anxiety   ? Bacterial vaginosis   ? Cancer ([place]) 06/2021  ? breast cancer  ? Complication of anesthesia   ? Depression   ? Endometriosis   ? Family history of breast cancer 07/17/2021  ? Family history of melanoma 07/17/2021  ? Family history of prostate cancer 07/17/2021  ? Family history of uterine cancer 07/17/2021  ? Hyperlipidemia   ? Migraine   ? PCOS (polycystic ovarian syndrome)   ? PONV (postoperative nausea and vomiting)   ? ?Past Surgical History:  ?Past Surgical History:  ?Procedure Laterality Date  ? IR IMAGING GUIDED PORT INSERTION  09/09/2021  ? LAPAROSCOPY  1993  ? MASTECTOMY W/ SENTINEL NODE BIOPSY Right 08/18/2021  ? Procedure: RIGHT MASTECTOMY WITH RIGHT SENTINEL LYMPH NODE BIOPSY;  Surgeon: Jovita Kussmaul, MD;  Location: [facility];  Service: General;  Laterality: Right;  ? SALPINGECTOMY Bilateral 2015  ? TOTAL MASTECTOMY Left 08/18/2021  ? Procedure: LEFT TOTAL MASTECTOMY;  Surgeon: Jovita Kussmaul, MD;  Location: [facility];  Service: General;  Laterality: Left;  ? VAGINAL HYSTERECTOMY  2017  ? ?Social History:  ?Social History  ? ?Socioeconomic History  ? Marital status: Divorced  ?  Spouse name: Not on file   ? Number of children: 2  ? Years of education: Not on file  ? Highest education level: Not on file  ?Occupational History  ? Occupation: Therapist, sports  ?Tobacco Use  ? Smoking status: Former  ?  Types: Cigarettes  ?  Quit date: 2018  ?  Years since quitting: 5.3  ? Smokeless tobacco: Never  ?Vaping Use  ? Vaping Use: Former  ?Substance and Sexual Activity  ? Alcohol use: Yes  ?  Comment: occasionally  ? Drug use: No  ? Sexual activity: Yes  ?  Birth control/protection: Surgical  ?Other Topics Concern  ? Not on file  ?Social History Narrative  ? 2 adopted children: 68 yo son, 23 yo daughter,   ? ?Social Determinants of Health  ? ?Financial Resource Strain: Not on file  ?Food Insecurity: Not on file  ?Transportation Needs: Not on file  ?Physical Activity: Not on file  ?Stress: Not on file  ?Social Connections: Not on file  ?Intimate Partner Violence: Not on file  ? ?Family History:  ?Family History  ?Problem Relation Age of Onset  ? Uterine cancer Mother 76  ? Diabetes Father   ? Hyperlipidemia Father   ? Hypertension Father   ?  Melanoma Sister 68  ?     T1a; on back  ? ADD / ADHD Sister   ? Diabetes Sister   ? Hypertension Sister   ? Heart disease Maternal Grandmother   ? Bipolar disorder Maternal Grandfather   ? Heart disease Paternal Grandmother   ? Prostate cancer Paternal Grandfather   ?     dx after 56  ? Diabetes Paternal Grandfather   ? Breast cancer Other 64  ?     MGM's mother  ? ? ?Review of Systems: ?Constitutional: Doesn't report fevers, chills or abnormal weight loss ?Eyes: Doesn't report blurriness of vision ?Ears, nose, mouth, throat, and face: Doesn't report sore throat ?Respiratory: Doesn't report cough, dyspnea or wheezes ?Cardiovascular: Doesn't report palpitation, chest discomfort  ?Gastrointestinal:  Doesn't report nausea, constipation, diarrhea ?GU: Doesn't report incontinence ?Skin: Doesn't report skin rashes ?Neurological: Per HPI ?Musculoskeletal: Doesn't report joint pain ?Behavioral/Psych: Doesn't  report anxiety ? ?Physical Exam: ?Vitals:  ? 02/16/22 1345  ?BP: (!) 94/59  ?Pulse: 97  ?Resp: 16  ?Temp: (!) 97.3 ?F (36.3 ?C)  ?SpO2: 100%  ? ?KPS: 100. ?General: Alert, cooperative, pleasant, in no acute distress ?[place]

## 2022-02-17 ENCOUNTER — Telehealth: Payer: Self-pay

## 2022-02-17 NOTE — Telephone Encounter (Signed)
Notified Patient of completion of Disability Form. Fax transmission confirmation received for Attending Physician's Statement of Functionality. Copy of Form mailed to Patient as requested. Request for medical records forwarded to [facility] Management Office with signed Release of Information Form. Request is for records from 11/26/2021 to discharge of current episode. ?

## 2022-02-18 ENCOUNTER — Encounter: Payer: Self-pay | Admitting: Rehabilitation

## 2022-02-18 ENCOUNTER — Ambulatory Visit: Payer: BC Managed Care – PPO | Attending: Hematology and Oncology | Admitting: Rehabilitation

## 2022-02-18 DIAGNOSIS — R600 Localized edema: Secondary | ICD-10-CM | POA: Insufficient documentation

## 2022-02-18 DIAGNOSIS — Z170 Estrogen receptor positive status [ER+]: Secondary | ICD-10-CM | POA: Insufficient documentation

## 2022-02-18 DIAGNOSIS — M25612 Stiffness of left shoulder, not elsewhere classified: Secondary | ICD-10-CM | POA: Insufficient documentation

## 2022-02-18 DIAGNOSIS — C50411 Malignant neoplasm of upper-outer quadrant of right female breast: Secondary | ICD-10-CM | POA: Insufficient documentation

## 2022-02-18 DIAGNOSIS — R293 Abnormal posture: Secondary | ICD-10-CM | POA: Insufficient documentation

## 2022-02-18 DIAGNOSIS — M25611 Stiffness of right shoulder, not elsewhere classified: Secondary | ICD-10-CM | POA: Insufficient documentation

## 2022-02-18 DIAGNOSIS — Z483 Aftercare following surgery for neoplasm: Secondary | ICD-10-CM | POA: Insufficient documentation

## 2022-02-18 NOTE — Progress Notes (Signed)
? ?Patient Care Team: ?Crecencio Mc, MD as PCP - General (Internal Medicine) ?Mauro Kaufmann, RN as Oncology Nurse Navigator ?Rockwell Germany, RN as Oncology Nurse Navigator ?Jovita Kussmaul, MD as Consulting Physician (General Surgery) ?Nicholas Lose, MD as Consulting Physician (Hematology and Oncology) ?Eppie Gibson, MD as Attending Physician (Radiation Oncology) ? ?DIAGNOSIS:  ?Encounter Diagnoses  ?Name Primary?  ? Malignant neoplasm of upper-outer quadrant of right breast in female, estrogen receptor negative ([place]) Yes  ? Sleep apnea, unspecified type   ? Encounter for preventive health examination   ? ? ?SUMMARY OF ONCOLOGIC HISTORY: ?Oncology History  ?Malignant neoplasm of upper-outer quadrant of right breast in female, estrogen receptor negative ([place])  ?07/08/2021 Initial Diagnosis  ? Palpable right breast lump. Diagnostic mammogram: showed highly suspicious right breast mass and an indeterminate single right axillary lymph node with up to 5 mm diffuse cortical thickening. Biopsy: grade 2-3 invasive ductal carcinoma, DCIS, and right axillary lymph node negative for metastatic carcinoma; ER+(80%)/PR-/Her2-. ?  ?07/16/2021 Cancer Staging  ? Staging form: Breast, AJCC 8th Edition ?- Clinical stage from 07/16/2021: Stage IIB (cT2, cN0, cM0, G3, ER+, PR-, HER2-) - Signed by Nicholas Lose, MD on 07/16/2021 ?Stage prefix: Initial diagnosis ?Histologic grading system: 3 grade system ? ?  ?08/18/2021 Surgery  ? Right mastectomy: Grade 3 IDC 2.8 cm with high-grade DCIS, margins negative, 0/3 lymph nodes negative, ER 0%, PR 0%, HER2 negative, Ki-67 60% (triple negative) ?  ?09/22/2021 -  Chemotherapy  ? Patient is on Treatment Plan : BREAST Dose Dense AC q14d / CARBOplatin D1 + PACLitaxel D1,8,15 q21d  ? ?  ?  ?10/06/2021 Genetic Testing  ? Negative hereditary cancer genetic testing: no pathogenic variants detected in [facility] CancerNext-Expanded +RNAinsight Panel.  The report date is 10/06/2021.  ? ?The  CancerNext-Expanded gene panel offered by [facility] and includes sequencing, rearrangement, and RNA analysis for the following 77 genes: AIP, ALK, APC, ATM, AXIN2, BAP1, BARD1, BLM, BMPR1A, BRCA1, BRCA2, BRIP1, CDC73, CDH1, CDK4, CDKN1B, CDKN2A, CHEK2, CTNNA1, DICER1, FANCC, FH, FLCN, GALNT12, KIF1B, LZTR1, MAX, MEN1, MET, MLH1, MSH2, MSH3, MSH6, MUTYH, NBN, NF1, NF2, NTHL1, PALB2, PHOX2B, PMS2, POT1, PRKAR1A, PTCH1, PTEN, RAD51C, RAD51D, RB1, RECQL, RET, SDHA, SDHAF2, SDHB, SDHC, SDHD, SMAD4, SMARCA4, SMARCB1, SMARCE1, STK11, SUFU, TMEM127, TP53, TSC1, TSC2, VHL and XRCC2 (sequencing and deletion/duplication); EGFR, EGLN1, HOXB13, KIT, MITF, PDGFRA, POLD1, and POLE (sequencing only); EPCAM and GREM1 (deletion/duplication only).  ?  ? ? ?CHIEF COMPLIANT:  Follow-up on recent scans ?  ? ?INTERVAL HISTORY: Katelyn Hobbs is a 46 y.o. with above mention  triple negative breast cancer. She presents to the clinic today for Cycle 12 taxol with [place]. She states the energy is up and down. She states that when she walks she has to take tylenol. She states that she gained unusual weight. She denies numbness and tingling. ?She had a recent stroke and was placed on aspirin.  She has recovered neurologically from the stroke. ? ? ?ALLERGIES:  is allergic to benadryl [diphenhydramine], latex, and phenergan [promethazine hcl]. ? ?MEDICATIONS:  ?Current Outpatient Medications  ?Medication Sig Dispense Refill  ? acetaminophen (TYLENOL) 500 MG tablet Take 1,000 mg by mouth every 8 (eight) hours as needed for moderate pain.    ? ALPRAZolam (XANAX XR) 2 MG 24 hr tablet TAKE 1 TABLET BY MOUTH EVERYDAY AT BEDTIME 30 tablet 2  ? bimatoprost (LATISSE) 0.03 % ophthalmic solution Place 1 application. into both eyes at bedtime. Apply evenly along the skin of  the upper eyelid at base of eyelashes once daily at bedtime; repeat procedure for second eye (use a clean applicator). 3 mL 12  ? citalopram (CELEXA) 40 MG tablet Take 1 tablet  (40 mg total) by mouth daily. 90 tablet 1  ? ergocalciferol (DRISDOL) 1.25 MG (50000 UT) capsule Take 1 capsule (50,000 Units total) by mouth once a week. 12 capsule 0  ? ibuprofen (ADVIL) 200 MG tablet Take 800 mg by mouth every 8 (eight) hours as needed for moderate pain.    ? loratadine (CLARITIN) 10 MG tablet Take 10 mg by mouth daily.    ? Multiple Vitamins-Minerals (MULTIVITAMIN WITH MINERALS) tablet Take 1 tablet by mouth daily.    ? vitamin B-12 (CYANOCOBALAMIN) 500 MCG tablet Take 500 mcg by mouth daily.    ? ?No current facility-administered medications for this visit.  ? ? ?PHYSICAL EXAMINATION: ?ECOG PERFORMANCE STATUS: 1 - Symptomatic but completely ambulatory ? ?Vitals:  ? 03/04/22 1020  ?BP: 119/61  ?Pulse: 88  ?Resp: 18  ?Temp: 97.8 ?F (36.6 ?C)  ?SpO2: 98%  ? ?Filed Weights  ? 03/04/22 1020  ?Weight: 200 lb 9.6 oz (91 kg)  ? ?  ? ?LABORATORY DATA:  ?I have reviewed the data as listed ? ?  Latest Ref Rng & Units 02/04/2022  ?  8:39 AM 01/27/2022  ? 10:27 AM 01/20/2022  ?  9:35 AM  ?CMP  ?Glucose 70 - 99 mg/dL 99   94   102    ?BUN 6 - 20 mg/dL '9   12   12    ' ?Creatinine 0.44 - 1.00 mg/dL 0.70   0.57   0.64    ?Sodium 135 - 145 mmol/L 136   137   139    ?Potassium 3.5 - 5.1 mmol/L 3.8   4.1   3.7    ?Chloride 98 - 111 mmol/L 101   101   104    ?CO2 22 - 32 mmol/L '28   30   29    ' ?Calcium 8.9 - 10.3 mg/dL 9.3   9.5   9.4    ?Total Protein 6.5 - 8.1 g/dL 7.4   7.3   6.9    ?Total Bilirubin 0.3 - 1.2 mg/dL 0.4   0.4   0.3    ?Alkaline Phos 38 - 126 U/L 52   48   58    ?AST 15 - 41 U/L '28   17   16    ' ?ALT 0 - 44 U/L '28   22   20    ' ? ? ?Lab Results  ?Component Value Date  ? WBC 3.3 (L) 02/04/2022  ? HGB 10.5 (L) 02/04/2022  ? HCT 30.2 (L) 02/04/2022  ? MCV 101.3 (H) 02/04/2022  ? PLT 200 02/04/2022  ? NEUTROABS 1.5 (L) 02/04/2022  ? ? ?ASSESSMENT & PLAN:  ?Malignant neoplasm of upper-outer quadrant of right breast in female, estrogen receptor negative ([place]) ?Malignant neoplasm of upper-outer quadrant of  right breast in female, estrogen receptor negative ([place]) ?07/08/2021: Palpable right breast lump. Diagnostic mammogram: showed highly suspicious right breast mass and an indeterminate single right axillary lymph node with up to 5 mm diffuse cortical thickening. Biopsy: grade 2-3 invasive ductal carcinoma, DCIS, and right axillary lymph node negative for metastatic carcinoma; ER+(80%)/PR-/Her2-. ?  ?On the final pathology her estrogen receptor is negative and therefore patient has triple negative breast cancer ?  ?Recommendations: ?1. Right mastectomy: Grade 3 IDC 2.8 cm with high-grade DCIS, margins negative,  0/3 lymph nodes negative, ER 0%, PR 0%, HER2 negative, Ki-67 40% (final pathology is triple negative disease) ?2. adjuvant chemotherapy with dose dense Adriamycin and Cytoxan followed by Taxol and carboplatin ?3. Genetic testing: No mutations ?-------------------------------------------------------------------------------------------------- ?Current treatment: Completed 4 cycles of dose dense Adriamycin and Cytoxan, and 12 cycles of Taxol with carboplatin (every 3 weeks) ?  ?Bone scan 03/02/2022: No evidence of metastatic disease involving the bones ?CT CAP 03/02/2022: No evidence of metastatic disease, prior bilateral axillary node dissections ? ?I sent a message to Dr. Marlou Starks to remove her port. ? ?Recent stroke: Currently on aspirin. ? ?Preventive screening: She wishes to undergo colonoscopy screening. I sent a referral to gastroenterology to Dr. Silverio Decamp. ?Sleep apnea: We will send her for sleep study. ? ?Return to clinic in 3 months for survivorship care plan visit ? ? ? ?Orders Placed This Encounter  ?Procedures  ? Ambulatory referral to Gastroenterology  ?  Referral Priority:   Routine  ?  Referral Type:   Consultation  ?  Referral Reason:   Specialty Services Required  ?  Referred to Provider:   Mauri Pole, MD  ?  Number of Visits Requested:   1  ? Polysomnography 4 or more parameters  ?  Standing  Status:   Future  ?  Standing Expiration Date:   03/05/2023  ?  Order Specific Question:   Where should this test be performed:  ?  Answer:   [facility]  ? ?The patient has a good understanding of the overall plan. she agrees

## 2022-02-18 NOTE — Therapy (Signed)
?OUTPATIENT PHYSICAL THERAPY SOZO SCREENING NOTE ? ? ?Patient Name: Katelyn Hobbs ?MRN: [id number] ?DOB:06/19/1976, 46 y.o., female ?Today's Date: 02/18/2022 ? ?PCP: Crecencio Mc, MD ?REFERRING PROVIDER: Nicholas Lose, MD ? ? PT End of Session - 02/18/22 8366   ? ? Visit Number 4   screen only  ? PT Start Time [phone]   ? PT Stop Time 0808   ? PT Time Calculation (min) 13 min   ? Activity Tolerance Patient tolerated treatment well   ? Behavior During Therapy [facility] for tasks assessed/performed   ? ?  ?  ? ?  ? ? ?Past Medical History:  ?Diagnosis Date  ? Abnormal uterine bleeding   ? ADHD (attention deficit hyperactivity disorder)   ? Anemia   ? Anxiety   ? Bacterial vaginosis   ? Cancer ([place]) 06/2021  ? breast cancer  ? Complication of anesthesia   ? Depression   ? Endometriosis   ? Family history of breast cancer 07/17/2021  ? Family history of melanoma 07/17/2021  ? Family history of prostate cancer 07/17/2021  ? Family history of uterine cancer 07/17/2021  ? Hyperlipidemia   ? Migraine   ? PCOS (polycystic ovarian syndrome)   ? PONV (postoperative nausea and vomiting)   ? ?Past Surgical History:  ?Procedure Laterality Date  ? IR IMAGING GUIDED PORT INSERTION  09/09/2021  ? LAPAROSCOPY  1993  ? MASTECTOMY W/ SENTINEL NODE BIOPSY Right 08/18/2021  ? Procedure: RIGHT MASTECTOMY WITH RIGHT SENTINEL LYMPH NODE BIOPSY;  Surgeon: Jovita Kussmaul, MD;  Location: [facility];  Service: General;  Laterality: Right;  ? SALPINGECTOMY Bilateral 2015  ? TOTAL MASTECTOMY Left 08/18/2021  ? Procedure: LEFT TOTAL MASTECTOMY;  Surgeon: Jovita Kussmaul, MD;  Location: [place];  Service: General;  Laterality: Left;  ? VAGINAL HYSTERECTOMY  2017  ? ?Patient Active Problem List  ? Diagnosis Date Noted  ? Stroke (cerebrum) ([place]) 02/16/2022  ? Port-A-Cath in place 10/21/2021  ? Cancer of right female breast ([place]) 08/18/2021  ? Family history of uterine cancer 07/17/2021  ? Family history of breast cancer 07/17/2021  ? Family history of prostate  cancer 07/17/2021  ? Family history of melanoma 07/17/2021  ? Genetic testing 07/17/2021  ? Malignant neoplasm of upper-outer quadrant of right breast in female, estrogen receptor negative ([place]) 07/14/2021  ? Otitis media 06/11/2021  ? Mass of breast, right 06/11/2021  ? Breast calcifications on mammogram 03/16/2021  ? Encounter for preventive health examination 01/08/2021  ? Primary insomnia 09/24/2020  ? PCOS (polycystic ovarian syndrome) 04/16/2020  ? Paroxysmal SVT (supraventricular tachycardia) ([place]) 04/16/2020  ? Screen for STD (sexually transmitted disease) 04/16/2020  ? S/P total hysterectomy 04/16/2020  ? GAD (generalized anxiety disorder) 11/11/2019  ? Vitamin D deficiency 09/21/2015  ? Elevated glucose level 09/21/2015  ? Obesity (BMI 30.0-34.9) 09/17/2015  ? ? ?REFERRING DIAG: right breast cancer at risk for lymphedema ? ?THERAPY DIAG:  ?Aftercare following surgery for neoplasm ? ?Malignant neoplasm of upper-outer quadrant of right breast in female, estrogen receptor positive ([place]) ? ?Abnormal posture ? ?Stiffness of left shoulder, not elsewhere classified ? ?Stiffness of right shoulder, not elsewhere classified ? ?Localized edema ? ?PERTINENT HISTORY: Rt mastectomy 08/18/21 due to Grade 3 triple negative breast cancer. 0/3LN negative. Chemo completed ? ?PRECAUTIONS: right UE Lymphedema risk,  ? ?SUBJECTIVE: I am feeling really good.  I haven't worn my sleeve the past 3 days ? ?PAIN:  ?Are you having pain? No ? ?SOZO SCREENING: ?Patient was  assessed today using the SOZO machine to determine the lymphedema index score. This was compared to her baseline score. It was determined that she is NOT within the recommended range when compared to her baseline and so she will continue wearing her compression sleeve and gaunlet x 6 more weeks due to upcoming surgery and then recheck SOZO ? ? ?Stark Bray, PT ?02/18/2022, 8:11 AM ? ?  ? ?

## 2022-02-25 DIAGNOSIS — C44501 Unspecified malignant neoplasm of skin of breast: Secondary | ICD-10-CM | POA: Diagnosis not present

## 2022-02-25 DIAGNOSIS — F419 Anxiety disorder, unspecified: Secondary | ICD-10-CM | POA: Diagnosis not present

## 2022-02-25 DIAGNOSIS — G479 Sleep disorder, unspecified: Secondary | ICD-10-CM | POA: Diagnosis not present

## 2022-02-27 ENCOUNTER — Other Ambulatory Visit: Payer: Self-pay

## 2022-02-27 ENCOUNTER — Encounter (HOSPITAL_BASED_OUTPATIENT_CLINIC_OR_DEPARTMENT_OTHER): Payer: Self-pay | Admitting: General Surgery

## 2022-03-02 ENCOUNTER — Encounter (HOSPITAL_COMMUNITY)
Admission: RE | Admit: 2022-03-02 | Discharge: 2022-03-02 | Disposition: A | Discharge status: Home / Self Care | Payer: BC Managed Care – PPO | Source: Ambulatory Visit | Attending: Hematology and Oncology | Admitting: Hematology and Oncology

## 2022-03-02 ENCOUNTER — Encounter (HOSPITAL_COMMUNITY): Payer: Self-pay

## 2022-03-02 ENCOUNTER — Ambulatory Visit (HOSPITAL_COMMUNITY)
Admission: RE | Admit: 2022-03-02 | Discharge: 2022-03-02 | Disposition: A | Discharge status: Home / Self Care | Payer: BC Managed Care – PPO | Source: Ambulatory Visit | Attending: Hematology and Oncology | Admitting: Hematology and Oncology

## 2022-03-02 DIAGNOSIS — G44209 Tension-type headache, unspecified, not intractable: Secondary | ICD-10-CM | POA: Diagnosis not present

## 2022-03-02 DIAGNOSIS — Z171 Estrogen receptor negative status [ER-]: Secondary | ICD-10-CM | POA: Diagnosis not present

## 2022-03-02 DIAGNOSIS — K449 Diaphragmatic hernia without obstruction or gangrene: Secondary | ICD-10-CM | POA: Diagnosis not present

## 2022-03-02 DIAGNOSIS — C50411 Malignant neoplasm of upper-outer quadrant of right female breast: Secondary | ICD-10-CM | POA: Diagnosis not present

## 2022-03-02 DIAGNOSIS — Z853 Personal history of malignant neoplasm of breast: Secondary | ICD-10-CM | POA: Diagnosis not present

## 2022-03-02 DIAGNOSIS — M545 Low back pain, unspecified: Secondary | ICD-10-CM | POA: Diagnosis not present

## 2022-03-02 DIAGNOSIS — M79604 Pain in right leg: Secondary | ICD-10-CM | POA: Diagnosis not present

## 2022-03-02 DIAGNOSIS — C50919 Malignant neoplasm of unspecified site of unspecified female breast: Secondary | ICD-10-CM | POA: Diagnosis not present

## 2022-03-02 DIAGNOSIS — R911 Solitary pulmonary nodule: Secondary | ICD-10-CM | POA: Diagnosis not present

## 2022-03-02 DIAGNOSIS — M79605 Pain in left leg: Secondary | ICD-10-CM | POA: Diagnosis not present

## 2022-03-02 IMAGING — CT CT CHEST-ABD-PELV W/ CM
3 of 5 series · 15 of 36 positions shown, 17 images · IV contrast (OMNIPAQUE)
Comparison: CT [DATE]

CLINICAL DATA: Breast cancer, invasive, initial workup. Bone pain
in bilateral legs and lower back.

* Tracking Code: BO *
EXAM:
CT CHEST, ABDOMEN, AND PELVIS WITH CONTRAST
TECHNIQUE: Multidetector CT imaging of the chest, abdomen and pelvis was
performed following the standard protocol during bolus
administration of intravenous contrast.

[Series 2: cap with · axial · 0.83mm/px · z∈[-639,-109]mm · 10 of 130 slices shown, 12 images]
[im 12/130  mediastinal]
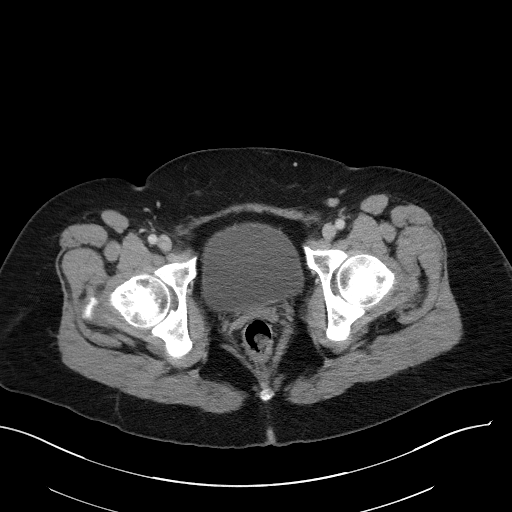
[im 12/130  bone]
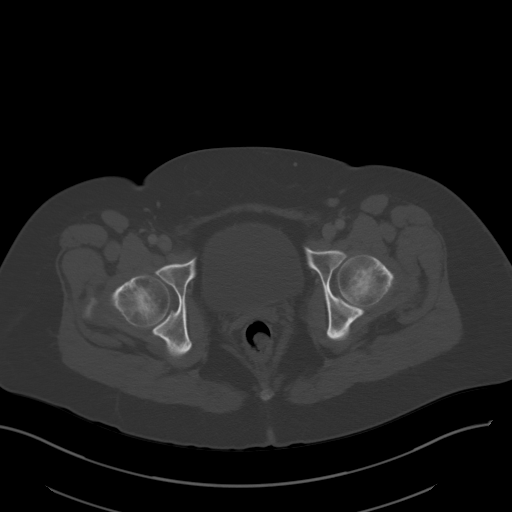
[im 24/130  mediastinal]
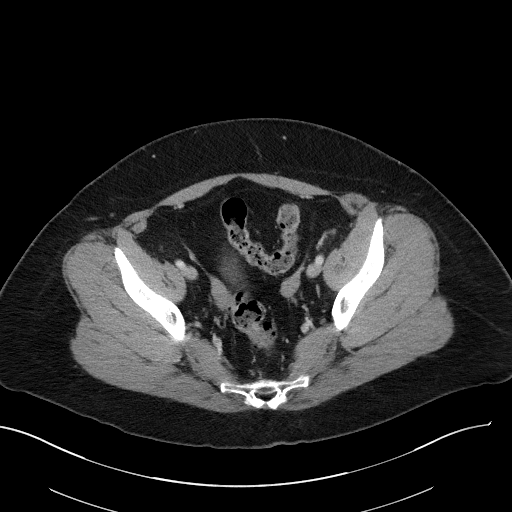
[im 36/130  mediastinal]
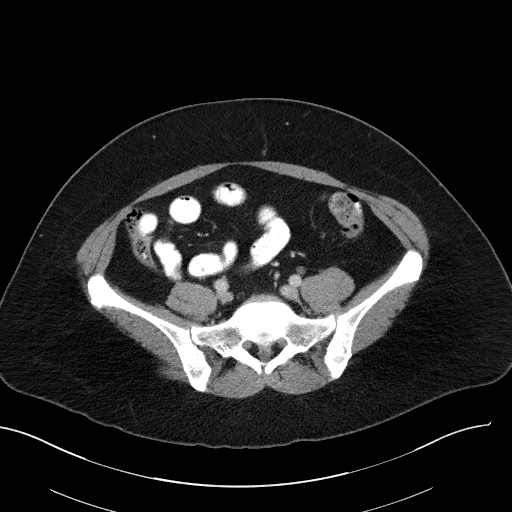
[im 47/130  mediastinal]
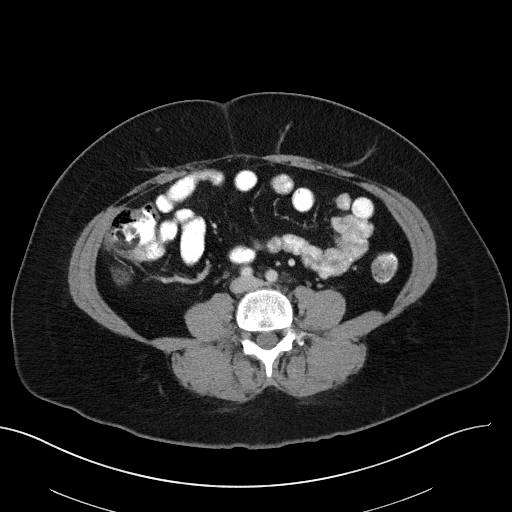
[im 59/130  mediastinal]
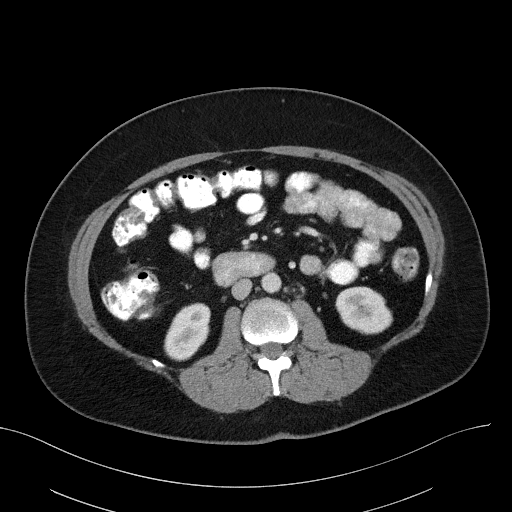
[im 71/130  mediastinal]
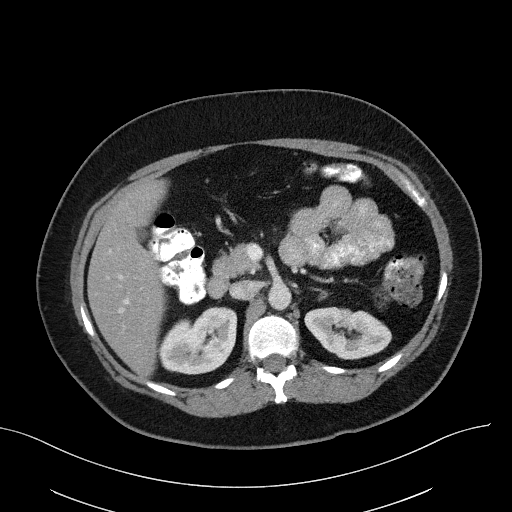
[im 83/130  mediastinal]
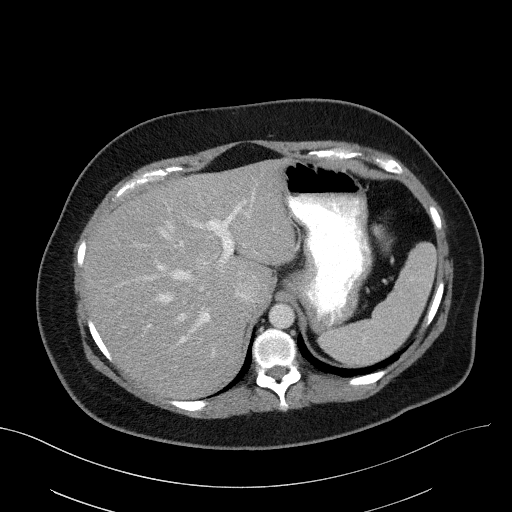
[im 94/130  mediastinal]
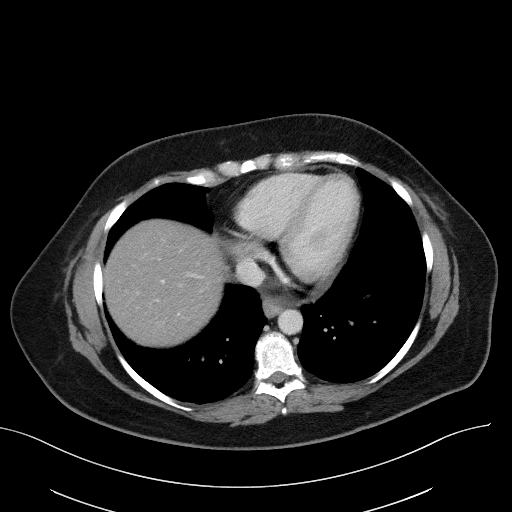
[im 106/130  mediastinal]
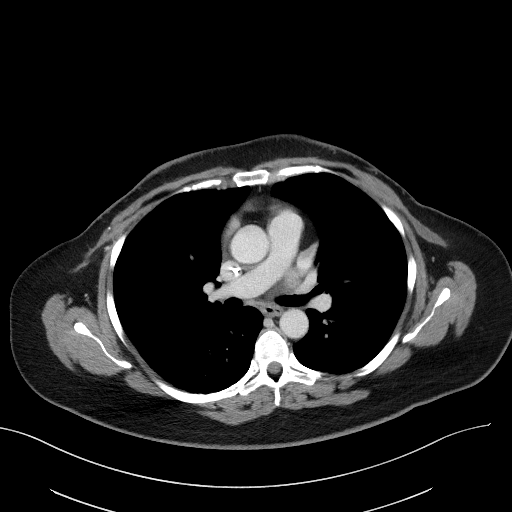
[im 106/130  bone]
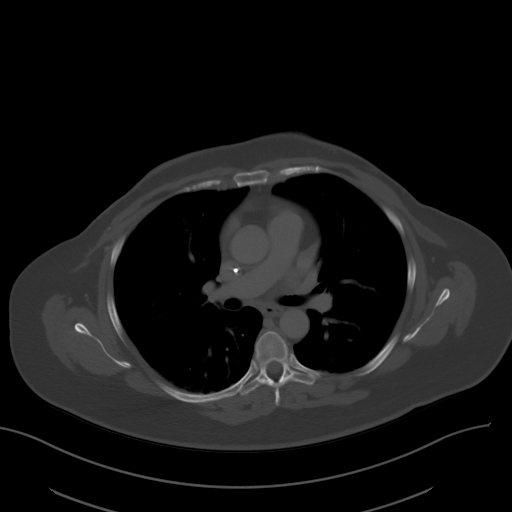
[im 118/130  mediastinal]
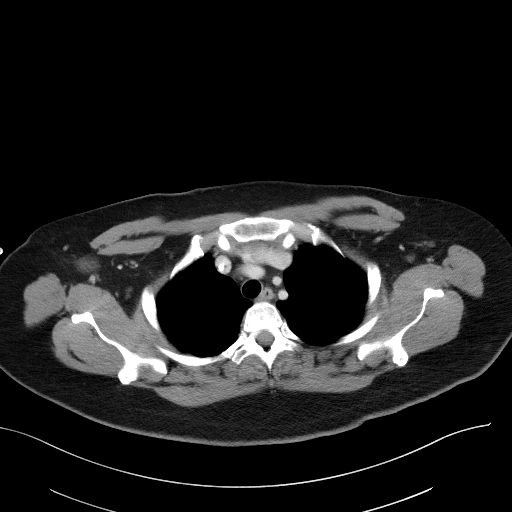

[Series 4: coronals · coronal · 1.04mm/px · 3 of 141 slices shown]
[im 29/141  mediastinal]
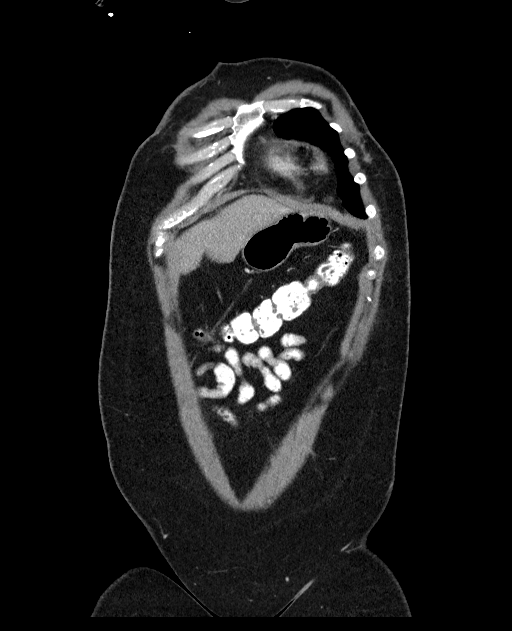
[im 57/141  mediastinal]
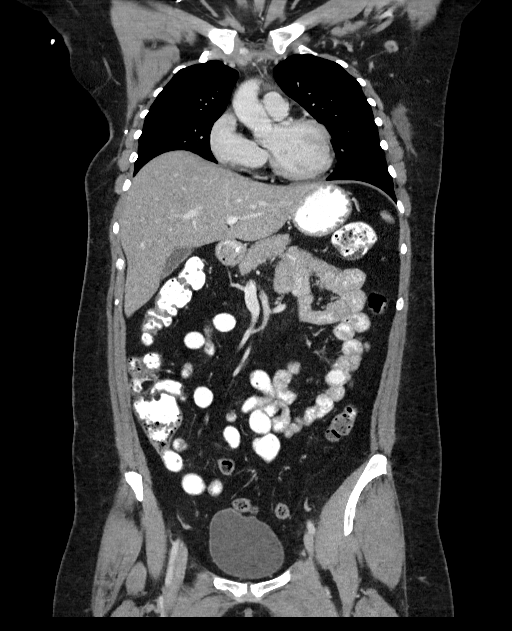
[im 85/141  mediastinal]
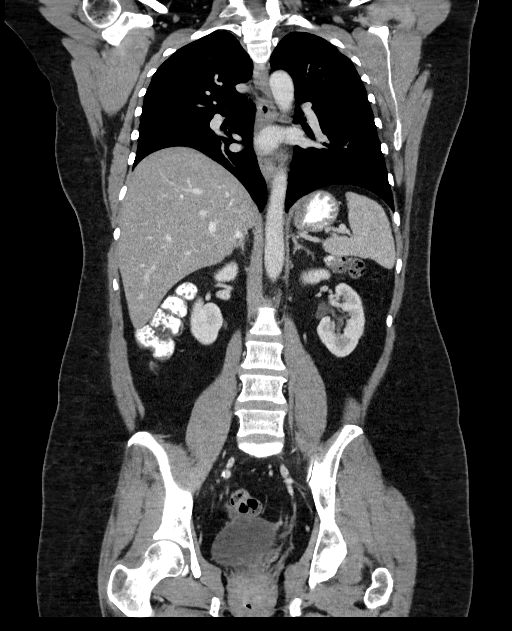

[Series 6: lung · axial · 0.83mm/px · z∈[-281,-239]mm · 2 of 127 slices shown]
[im 11/127  bone]
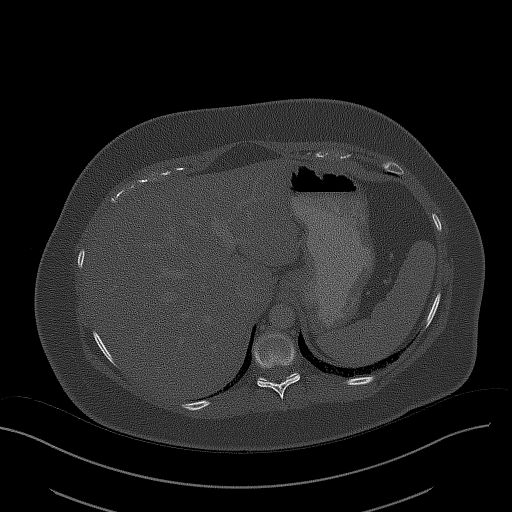
[im 32/127  bone]
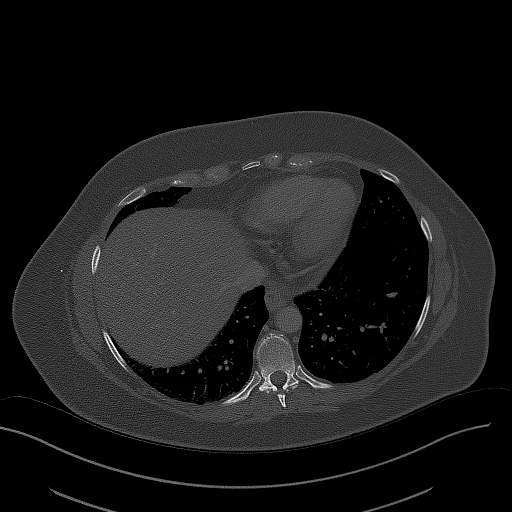

[15 of 36 positions shown; findings below may reference images not displayed]

RADIATION DOSE REDUCTION: This exam was performed according to the
departmental dose-optimization program which includes automated
exposure control, adjustment of the mA and/or kV according to
patient size and/or use of iterative reconstruction technique.

CONTRAST:  100mL OMNIPAQUE IOHEXOL 300 MG/ML  SOLN
FINDINGS: CT CHEST FINDINGS

Cardiovascular: Accessed right chest Port-A-Cath with tip at the
superior cavoatrial junction. Normal caliber thoracic aorta. No
central pulmonary embolus on this nondedicated study. Normal size
heart. Small pericardial effusion is within physiologic normal
limits.

Mediastinum/Nodes: No discrete thyroid nodule. No pathologically
enlarged mediastinal, hilar or axillary lymph nodes. Prior bilateral
axillary lymph node dissections with a fluid and stranding in the
left adnexa and a well-circumscribed low-density 3.4 cm fluid
collection in the right adnexa which is favored to reflect bilateral
postoperative change and a postoperative left-sided seroma. Small
hiatal hernia.

Lungs/Pleura: No suspicious pulmonary nodules or masses. 2 mm left
upper lobe pulmonary nodule on image 34/6 is stable dating back to
[DATE] consistent with a benign finding. No pleural
effusion. No pneumothorax.

Musculoskeletal: No suspicious lytic or blastic lesion of bone.

CT ABDOMEN PELVIS FINDINGS

Hepatobiliary: No suspicious hepatic lesion. Gallbladder is
unremarkable. No biliary ductal dilation.

Pancreas: No pancreatic ductal dilation or evidence of acute
inflammation.

Spleen: No splenomegaly or focal splenic lesion.

Adrenals/Urinary Tract: Bilateral adrenal glands appear normal. No
hydronephrosis. Kidneys demonstrate symmetric enhancement and
excretion of contrast material. Subcentimeter hypodense lesion in
the right upper pole kidney on image [DATE] is technically too small to
accurately characterize but statistically likely to reflect a cyst
which in the absence of clinically indicated signs/symptoms requires
no independent follow-up. Urinary bladder is unremarkable for degree
of distension.

Stomach/Bowel: Radiopaque enteric contrast material traverses the
descending colon. Small hiatal hernia. Stomach is otherwise
unremarkable for degree of distension. No pathologic dilation of
small or large bowel. The appendix and terminal ileum appear normal.
Moderate volume of formed stool throughout the colon.

Vascular/Lymphatic: Normal caliber abdominal aorta. No
pathologically enlarged abdominal or pelvic lymph nodes.

Reproductive: Status post hysterectomy. No adnexal masses.

Other: No significant abdominopelvic free fluid. No discrete
peritoneal or omental nodularity.

Musculoskeletal: No aggressive lytic or blastic lesion of bone.
IMPRESSION: 1. No evidence of metastatic disease within the chest, abdomen, or
pelvis.
2. Prior bilateral axillary lymph node dissections with a fluid and
stranding in the left adnexa and a well-circumscribed 3.4 cm fluid
collection in the right adnexa, favored to reflect bilateral
postoperative change and a postoperative left-sided seroma.
3. Small hiatal hernia.
4. Moderate volume of formed stool throughout the colon.

## 2022-03-02 IMAGING — NM NM BONE WHOLE BODY
2 series · 2 of 2 positions shown · non-contrast
Comparison: Same day CT of the chest abdomen and pelvis dated May

CLINICAL DATA: History of breast cancer, staging. Lower back and
bilateral lower extremity pain.

EXAM:
NUCLEAR MEDICINE WHOLE BODY BONE SCAN
TECHNIQUE: Whole body anterior and posterior images were obtained approximately
3 hours after intravenous injection of radiopharmaceutical.
RADIOPHARMACEUTICALS:  22.0 mCi [ML] MDP IV

[Series 1: whole body · 2.66mm/px · 1 of 1 slices shown (1 of 2)]
[im 1/1]
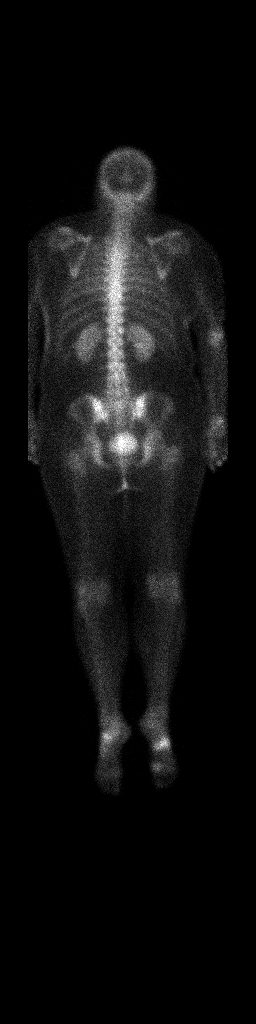

[Series 1: whole body · 2.66mm/px · 1 of 1 slices shown (2 of 2)]
[im 1/1]
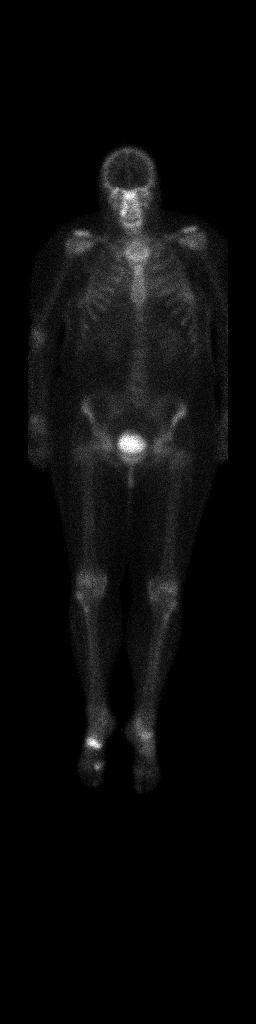

[2 of 2 positions shown; findings below may reference images not displayed]

FINDINGS: No convincing evidence of abnormal radiotracer uptake to suggest
osteoblastic metastatic disease.

Focus of radiotracer uptake right midfoot and proximal first digit
may reflect degenerative change or sequela of trauma.

Multifocal radiotracer uptake involving the spine, knees, right
elbow and ankles in a pattern most consistent with degenerative
arthropathy.

Focus of abnormal radiotracer uptake in right maxilla.

Otherwise physiologic distribution of radiotracer activity.
IMPRESSION: 1. No convincing scintigraphic evidence of osteoblastic metastatic
disease.

2. Focus of radiotracer uptake in the right mid foot and proximal
right first digit reflecting degenerative change or sequela of
trauma, consider more definitive characterization with dedicated
radiographs.

3. Focus of abnormal radiotracer uptake in the right maxilla,
reflect odontogenic disease.

## 2022-03-02 MED ORDER — HEPARIN SOD (PORK) LOCK FLUSH 100 UNIT/ML IV SOLN: 500.0000 [IU] | Freq: Once | INTRAVENOUS | Status: DC

## 2022-03-02 MED ORDER — IOHEXOL 300 MG/ML  SOLN
100.0000 mL | Freq: Once | INTRAMUSCULAR | Status: AC | PRN
  Administered 2022-03-02: 100 mL via INTRAVENOUS

## 2022-03-02 MED ORDER — SODIUM CHLORIDE (PF) 0.9 % IJ SOLN
INTRAMUSCULAR | Status: AC
  Filled 2022-03-02: qty 50

## 2022-03-02 MED ORDER — HEPARIN SOD (PORK) LOCK FLUSH 100 UNIT/ML IV SOLN
INTRAVENOUS | Status: AC
  Administered 2022-03-02: 500 [IU]
  Filled 2022-03-02: qty 5

## 2022-03-02 NOTE — Progress Notes (Signed)

## 2022-03-04 ENCOUNTER — Encounter: Payer: Self-pay | Admitting: *Deleted

## 2022-03-04 ENCOUNTER — Ambulatory Visit: Payer: Self-pay | Admitting: General Surgery

## 2022-03-04 ENCOUNTER — Other Ambulatory Visit: Payer: Self-pay

## 2022-03-04 ENCOUNTER — Inpatient Hospital Stay: Payer: BC Managed Care – PPO | Attending: Hematology and Oncology | Admitting: Hematology and Oncology

## 2022-03-04 VITALS — BP 119/61 | HR 88 | Temp 97.8°F | Resp 18 | Ht 64.0 in | Wt 200.6 lb

## 2022-03-04 DIAGNOSIS — Z79899 Other long term (current) drug therapy: Secondary | ICD-10-CM | POA: Diagnosis not present

## 2022-03-04 DIAGNOSIS — Z0000 Encounter for general adult medical examination without abnormal findings: Secondary | ICD-10-CM

## 2022-03-04 DIAGNOSIS — C50411 Malignant neoplasm of upper-outer quadrant of right female breast: Secondary | ICD-10-CM | POA: Diagnosis not present

## 2022-03-04 DIAGNOSIS — G4730 Sleep apnea, unspecified: Secondary | ICD-10-CM | POA: Insufficient documentation

## 2022-03-04 DIAGNOSIS — Z9221 Personal history of antineoplastic chemotherapy: Secondary | ICD-10-CM | POA: Diagnosis not present

## 2022-03-04 DIAGNOSIS — Z9011 Acquired absence of right breast and nipple: Secondary | ICD-10-CM | POA: Insufficient documentation

## 2022-03-04 DIAGNOSIS — Z171 Estrogen receptor negative status [ER-]: Secondary | ICD-10-CM | POA: Insufficient documentation

## 2022-03-04 NOTE — Assessment & Plan Note (Addendum)
Malignant neoplasm of upper-outer quadrant of right breast in female, estrogen receptor negative ([place]) ?07/08/2021:?Palpable right breast lump. Diagnostic mammogram: showed highly suspicious right breast mass and an indeterminate single right axillary lymph node with up to 5 mm diffuse cortical thickening. Biopsy: grade 2-3 invasive ductal carcinoma, DCIS, and right axillary lymph node negative for metastatic carcinoma; ER+(80%)/PR-/Her2-. ?? ?On the final pathology her estrogen receptor is negative and therefore patient has triple negative breast cancer ?? ?Recommendations: ?1.?Right mastectomy: Grade 3 IDC 2.8 cm with high-grade DCIS, margins negative, 0/3 lymph nodes negative, ER 0%, PR 0%, HER2 negative, Ki-67?40%?(final pathology is triple negative disease) ?2.?adjuvant chemotherapy with dose dense Adriamycin and Cytoxan followed by Taxol and carboplatin ?3. Genetic testing: No mutations ?-------------------------------------------------------------------------------------------------- ?Current treatment:?Completed 4 cycles of?dose dense Adriamycin and Cytoxan, and 12 cycles of?Taxol with carboplatin (every 3 weeks) ?? ?Bone scan 03/02/2022: No evidence of metastatic disease involving the bones ?CT CAP 03/02/2022: No evidence of metastatic disease, prior bilateral axillary node dissections ? ?I sent a message to Dr. Marlou Starks to remove her port. ?Return to clinic in 3 months for survivorship care plan visit ?

## 2022-03-05 ENCOUNTER — Telehealth: Payer: Self-pay | Admitting: Hematology and Oncology

## 2022-03-05 NOTE — Telephone Encounter (Signed)
Scheduled appointment per 5/10 los. Patient is aware. ?

## 2022-03-09 ENCOUNTER — Encounter (HOSPITAL_BASED_OUTPATIENT_CLINIC_OR_DEPARTMENT_OTHER): Payer: Self-pay | Admitting: General Surgery

## 2022-03-09 ENCOUNTER — Ambulatory Visit (HOSPITAL_BASED_OUTPATIENT_CLINIC_OR_DEPARTMENT_OTHER): Payer: BC Managed Care – PPO | Admitting: Certified Registered"

## 2022-03-09 ENCOUNTER — Other Ambulatory Visit: Payer: Self-pay

## 2022-03-09 ENCOUNTER — Encounter (HOSPITAL_BASED_OUTPATIENT_CLINIC_OR_DEPARTMENT_OTHER)
Admission: RE | Disposition: A | Discharge status: Home / Self Care | Payer: Self-pay | Source: Home / Self Care | Attending: General Surgery

## 2022-03-09 ENCOUNTER — Ambulatory Visit (HOSPITAL_BASED_OUTPATIENT_CLINIC_OR_DEPARTMENT_OTHER)
Admission: RE | Admit: 2022-03-09 | Discharge: 2022-03-09 | Disposition: A | Discharge status: Home / Self Care | Payer: BC Managed Care – PPO | Attending: General Surgery | Admitting: General Surgery

## 2022-03-09 DIAGNOSIS — F411 Generalized anxiety disorder: Secondary | ICD-10-CM | POA: Insufficient documentation

## 2022-03-09 DIAGNOSIS — L905 Scar conditions and fibrosis of skin: Secondary | ICD-10-CM | POA: Diagnosis not present

## 2022-03-09 DIAGNOSIS — L089 Local infection of the skin and subcutaneous tissue, unspecified: Secondary | ICD-10-CM | POA: Diagnosis not present

## 2022-03-09 DIAGNOSIS — R222 Localized swelling, mass and lump, trunk: Secondary | ICD-10-CM | POA: Diagnosis not present

## 2022-03-09 DIAGNOSIS — Z9013 Acquired absence of bilateral breasts and nipples: Secondary | ICD-10-CM | POA: Diagnosis not present

## 2022-03-09 DIAGNOSIS — M96843 Postprocedural seroma of a musculoskeletal structure following other procedure: Secondary | ICD-10-CM | POA: Insufficient documentation

## 2022-03-09 DIAGNOSIS — Z803 Family history of malignant neoplasm of breast: Secondary | ICD-10-CM | POA: Diagnosis not present

## 2022-03-09 DIAGNOSIS — Y838 Other surgical procedures as the cause of abnormal reaction of the patient, or of later complication, without mention of misadventure at the time of the procedure: Secondary | ICD-10-CM | POA: Insufficient documentation

## 2022-03-09 DIAGNOSIS — C50411 Malignant neoplasm of upper-outer quadrant of right female breast: Secondary | ICD-10-CM | POA: Insufficient documentation

## 2022-03-09 DIAGNOSIS — L929 Granulomatous disorder of the skin and subcutaneous tissue, unspecified: Secondary | ICD-10-CM | POA: Diagnosis not present

## 2022-03-09 DIAGNOSIS — F32A Depression, unspecified: Secondary | ICD-10-CM | POA: Diagnosis not present

## 2022-03-09 DIAGNOSIS — Z853 Personal history of malignant neoplasm of breast: Secondary | ICD-10-CM | POA: Diagnosis not present

## 2022-03-09 DIAGNOSIS — Z452 Encounter for adjustment and management of vascular access device: Secondary | ICD-10-CM | POA: Diagnosis not present

## 2022-03-09 HISTORY — PX: MASS EXCISION: SHX2000

## 2022-03-09 HISTORY — PX: PORT-A-CATH REMOVAL: SHX5289

## 2022-03-09 SURGERY — EXCISION MASS
Anesthesia: General | Site: Chest | Laterality: Right

## 2022-03-09 MED ORDER — PROPOFOL 10 MG/ML IV BOLUS
INTRAVENOUS | Status: DC | PRN
  Administered 2022-03-09: 150 mg via INTRAVENOUS

## 2022-03-09 MED ORDER — CEFAZOLIN SODIUM-DEXTROSE 2-4 GM/100ML-% IV SOLN: 2.0000 g | INTRAVENOUS | Status: DC

## 2022-03-09 MED ORDER — OXYCODONE HCL 5 MG PO TABS: 5.0000 mg | ORAL_TABLET | Freq: Four times a day (QID) | ORAL | 0 refills | Status: DC | PRN

## 2022-03-09 MED ORDER — ACETAMINOPHEN 500 MG PO TABS
1000.0000 mg | ORAL_TABLET | ORAL | Status: AC
Start: 2022-03-09 — End: 2022-03-09
  Administered 2022-03-09: 1000 mg via ORAL

## 2022-03-09 MED ORDER — FENTANYL CITRATE (PF) 100 MCG/2ML IJ SOLN
INTRAMUSCULAR | Status: DC | PRN
  Administered 2022-03-09 (×3): 25 ug via INTRAVENOUS
  Administered 2022-03-09 (×2): 50 ug via INTRAVENOUS
  Administered 2022-03-09: 25 ug via INTRAVENOUS

## 2022-03-09 MED ORDER — DEXAMETHASONE SODIUM PHOSPHATE 4 MG/ML IJ SOLN
INTRAMUSCULAR | Status: DC | PRN
Start: 2022-03-09 — End: 2022-03-09
  Administered 2022-03-09: 4 mg via INTRAVENOUS

## 2022-03-09 MED ORDER — GABAPENTIN 300 MG PO CAPS
300.0000 mg | ORAL_CAPSULE | ORAL | Status: AC
  Administered 2022-03-09: 300 mg via ORAL

## 2022-03-09 MED ORDER — HYDROMORPHONE HCL 1 MG/ML IJ SOLN
INTRAMUSCULAR | Status: AC
  Filled 2022-03-09: qty 0.5

## 2022-03-09 MED ORDER — FENTANYL CITRATE (PF) 100 MCG/2ML IJ SOLN
INTRAMUSCULAR | Status: AC
Start: 2022-03-09 — End: ?
  Filled 2022-03-09: qty 2

## 2022-03-09 MED ORDER — MIDAZOLAM HCL 5 MG/5ML IJ SOLN
INTRAMUSCULAR | Status: DC | PRN
  Administered 2022-03-09: 2 mg via INTRAVENOUS

## 2022-03-09 MED ORDER — BUPIVACAINE-EPINEPHRINE 0.25% -1:200000 IJ SOLN
INTRAMUSCULAR | Status: DC | PRN
  Administered 2022-03-09: 20 mL

## 2022-03-09 MED ORDER — CELECOXIB 200 MG PO CAPS
ORAL_CAPSULE | ORAL | Status: AC
  Filled 2022-03-09: qty 1

## 2022-03-09 MED ORDER — ACETAMINOPHEN 500 MG PO TABS
ORAL_TABLET | ORAL | Status: AC
  Filled 2022-03-09: qty 2

## 2022-03-09 MED ORDER — OXYCODONE HCL 5 MG PO TABS
5.0000 mg | ORAL_TABLET | Freq: Once | ORAL | Status: AC
  Administered 2022-03-09: 5 mg via ORAL

## 2022-03-09 MED ORDER — PHENYLEPHRINE HCL (PRESSORS) 10 MG/ML IV SOLN
INTRAVENOUS | Status: DC | PRN
  Administered 2022-03-09 (×2): 40 ug via INTRAVENOUS

## 2022-03-09 MED ORDER — CHLORHEXIDINE GLUCONATE CLOTH 2 % EX PADS: 6.0000 | MEDICATED_PAD | Freq: Once | CUTANEOUS | Status: DC

## 2022-03-09 MED ORDER — BUPIVACAINE-EPINEPHRINE (PF) 0.25% -1:200000 IJ SOLN
INTRAMUSCULAR | Status: AC
  Filled 2022-03-09: qty 30

## 2022-03-09 MED ORDER — HYDROMORPHONE HCL 1 MG/ML IJ SOLN
0.2500 mg | INTRAMUSCULAR | Status: DC | PRN
  Administered 2022-03-09 (×2): 0.5 mg via INTRAVENOUS

## 2022-03-09 MED ORDER — ONDANSETRON HCL 4 MG/2ML IJ SOLN
INTRAMUSCULAR | Status: DC | PRN
  Administered 2022-03-09: 4 mg via INTRAVENOUS

## 2022-03-09 MED ORDER — PROPOFOL 500 MG/50ML IV EMUL
INTRAVENOUS | Status: DC | PRN
  Administered 2022-03-09: 135 ug/kg/min via INTRAVENOUS

## 2022-03-09 MED ORDER — OXYCODONE HCL 5 MG PO TABS
ORAL_TABLET | ORAL | Status: AC
Start: 2022-03-09 — End: ?
  Filled 2022-03-09: qty 1

## 2022-03-09 MED ORDER — CELECOXIB 200 MG PO CAPS
200.0000 mg | ORAL_CAPSULE | ORAL | Status: AC
  Administered 2022-03-09: 200 mg via ORAL

## 2022-03-09 MED ORDER — LACTATED RINGERS IV SOLN: INTRAVENOUS | Status: DC

## 2022-03-09 MED ORDER — MIDAZOLAM HCL 2 MG/2ML IJ SOLN
INTRAMUSCULAR | Status: AC
  Filled 2022-03-09: qty 2

## 2022-03-09 MED ORDER — CEFAZOLIN SODIUM-DEXTROSE 2-4 GM/100ML-% IV SOLN
INTRAVENOUS | Status: AC
  Filled 2022-03-09: qty 100

## 2022-03-09 MED ORDER — GABAPENTIN 300 MG PO CAPS
ORAL_CAPSULE | ORAL | Status: AC
  Filled 2022-03-09: qty 1

## 2022-03-09 SURGICAL SUPPLY — 38 items
ADH SKN CLS APL DERMABOND .7 (GAUZE/BANDAGES/DRESSINGS) ×2
APL PRP STRL LF DISP 70% ISPRP (MISCELLANEOUS) ×2
BINDER BREAST XXLRG (GAUZE/BANDAGES/DRESSINGS) ×1 IMPLANT
BIOPATCH RED 1 DISK 7.0 (GAUZE/BANDAGES/DRESSINGS) ×2 IMPLANT
BLADE SURG 15 STRL LF DISP TIS (BLADE) ×2 IMPLANT
BLADE SURG 15 STRL SS (BLADE) ×3
CHLORAPREP W/TINT 26 (MISCELLANEOUS) ×3 IMPLANT
COVER BACK TABLE 60X90IN (DRAPES) ×3 IMPLANT
COVER MAYO STAND STRL (DRAPES) ×3 IMPLANT
DERMABOND ADVANCED (GAUZE/BANDAGES/DRESSINGS) ×1
DERMABOND ADVANCED .7 DNX12 (GAUZE/BANDAGES/DRESSINGS) ×2 IMPLANT
DRAIN CHANNEL 19F RND (DRAIN) ×2 IMPLANT
DRAPE LAPAROTOMY 100X72 PEDS (DRAPES) ×3 IMPLANT
DRAPE UTILITY XL STRL (DRAPES) ×3 IMPLANT
DRSG TEGADERM 2-3/8X2-3/4 SM (GAUZE/BANDAGES/DRESSINGS) ×2 IMPLANT
ELECT COATED BLADE 2.86 ST (ELECTRODE) ×3 IMPLANT
ELECT REM PT RETURN 9FT ADLT (ELECTROSURGICAL) ×3
ELECTRODE REM PT RTRN 9FT ADLT (ELECTROSURGICAL) ×2 IMPLANT
EVACUATOR SILICONE 100CC (DRAIN) ×2 IMPLANT
GLOVE SURG SS PI 6.5 STRL IVOR (GLOVE) ×1 IMPLANT
GLOVE SURG SS PI 7.5 STRL IVOR (GLOVE) ×1 IMPLANT
GOWN STRL REUS W/ TWL LRG LVL3 (GOWN DISPOSABLE) ×4 IMPLANT
GOWN STRL REUS W/TWL LRG LVL3 (GOWN DISPOSABLE) ×6
NDL HYPO 25X1 1.5 SAFETY (NEEDLE) IMPLANT
NEEDLE HYPO 25X1 1.5 SAFETY (NEEDLE) ×3 IMPLANT
NS IRRIG 1000ML POUR BTL (IV SOLUTION) ×3 IMPLANT
PACK BASIN DAY SURGERY FS (CUSTOM PROCEDURE TRAY) ×3 IMPLANT
PENCIL SMOKE EVACUATOR (MISCELLANEOUS) ×3 IMPLANT
PIN SAFETY STERILE (MISCELLANEOUS) ×1 IMPLANT
SLEEVE SCD COMPRESS KNEE MED (STOCKING) ×3 IMPLANT
SPONGE T-LAP 18X18 ~~LOC~~+RFID (SPONGE) ×3 IMPLANT
SUT ETHILON 2 0 FS 18 (SUTURE) ×2 IMPLANT
SUT MON AB 4-0 PC3 18 (SUTURE) ×3 IMPLANT
SUT SILK 2 0 SH (SUTURE) ×2 IMPLANT
SUT VIC AB 3-0 SH 27 (SUTURE) ×9
SUT VIC AB 3-0 SH 27X BRD (SUTURE) IMPLANT
SYR CONTROL 10ML LL (SYRINGE) ×1 IMPLANT
TOWEL GREEN STERILE FF (TOWEL DISPOSABLE) ×6 IMPLANT

## 2022-03-09 NOTE — Anesthesia Preprocedure Evaluation (Addendum)
Anesthesia Evaluation  ?Patient identified by MRN, date of birth, ID band ?Patient awake ? ? ? ?Reviewed: ?Allergy & Precautions, H&P , NPO status , Patient's Chart, lab work & pertinent test results ? ?History of Anesthesia Complications ?(+) PONV and history of anesthetic complications ? ?Airway ?Mallampati: III ? ?TM Distance: >3 FB ?Neck ROM: Full ? ? ? Dental ?no notable dental hx. ?(+) Teeth Intact, Dental Advisory Given ?  ?Pulmonary ?neg pulmonary ROS, former smoker,  ?  ?Pulmonary exam normal ?breath sounds clear to auscultation ? ? ? ? ? ? Cardiovascular ?negative cardio ROS ? ? ?Rhythm:Regular Rate:Normal ? ? ?  ?Neuro/Psych ? Headaches, Anxiety Depression   ? GI/Hepatic ?negative GI ROS, Neg liver ROS,   ?Endo/Other  ?negative endocrine ROS ? Renal/GU ?negative Renal ROS  ?negative genitourinary ?  ?Musculoskeletal ? ? Abdominal ?  ?Peds ? Hematology ? ?(+) Blood dyscrasia, anemia ,   ?Anesthesia Other Findings ? ? Reproductive/Obstetrics ?negative OB ROS ? ?  ? ? ? ? ? ? ? ? ? ? ? ? ? ?  ?  ? ? ? ? ? ? ? ?Anesthesia Physical ?Anesthesia Plan ? ?ASA: 2 ? ?Anesthesia Plan: General  ? ?Post-op Pain Management: Tylenol PO (pre-op)* and Toradol IV (intra-op)*  ? ?Induction: Intravenous ? ?PONV Risk Score and Plan: 4 or greater and Ondansetron, Dexamethasone, TIVA and Midazolam ? ?Airway Management Planned: LMA ? ?Additional Equipment:  ? ?Intra-op Plan:  ? ?Post-operative Plan: Extubation in OR ? ?Informed Consent: I have reviewed the patients History and Physical, chart, labs and discussed the procedure including the risks, benefits and alternatives for the proposed anesthesia with the patient or authorized representative who has indicated his/her understanding and acceptance.  ? ? ? ?Dental advisory given ? ?Plan Discussed with: CRNA ? ?Anesthesia Plan Comments:   ? ? ? ? ? ? ?Anesthesia Quick Evaluation ? ?

## 2022-03-09 NOTE — Transfer of Care (Signed)
Immediate Anesthesia Transfer of Care Note ? ?Patient: Katelyn Hobbs ? ?Procedure(s) Performed: BILATERAL EXCISION OF EXCESS TISSUE CHEST WALL (Bilateral: Chest) ?REMOVAL PORT-A-CATH (Right: Chest) ? ?Patient Location: PACU ? ?Anesthesia Type:General ? ?Level of Consciousness: awake, alert , oriented and patient cooperative ? ?Airway & Oxygen Therapy: Patient Spontanous Breathing and Patient connected to face mask oxygen ? ?Post-op Assessment: Report given to RN and Post -op Vital signs reviewed and stable ? ?Post vital signs: Reviewed and stable ? ?Last Vitals:  ?Vitals Value Taken Time  ?BP    ?Temp    ?Pulse 82 03/09/22 1023  ?Resp    ?SpO2 98 % 03/09/22 1023  ?Vitals shown include unvalidated device data. ? ?Last Pain:  ?Vitals:  ? 03/09/22 0713  ?TempSrc: Oral  ?PainSc: 0-No pain  ?   ? ?Patients Stated Pain Goal: 7 (03/09/22 6503) ? ?Complications: No notable events documented. ?

## 2022-03-09 NOTE — Interval H&P Note (Signed)
History and Physical Interval Note: ? ?03/09/2022 ?8:11 AM ? ?Katelyn Hobbs  has presented today for surgery, with the diagnosis of EXCESS TISSUE CHEST WALL BILATERAL.  The various methods of treatment have been discussed with the patient and family. After consideration of risks, benefits and other options for treatment, the patient has consented to  Procedure(s): ?BILATERAL EXCISION OF EXCESS TISSUE CHEST WALL (Bilateral) as a surgical intervention.  The patient's history has been reviewed, patient examined, no change in status, stable for surgery.  I have reviewed the patient's chart and labs.  Questions were answered to the patient's satisfaction.   ? ? ?[name] ? ? ?

## 2022-03-09 NOTE — Anesthesia Procedure Notes (Signed)
Procedure Name: LMA Insertion ?Date/Time: 03/09/2022 8:25 AM ?Performed by: Signe Colt, CRNA ?Pre-anesthesia Checklist: Patient identified, Emergency Drugs available, Suction available and Patient being monitored ?Patient Re-evaluated:Patient Re-evaluated prior to induction ?Oxygen Delivery Method: Circle System Utilized ?Preoxygenation: Pre-oxygenation with 100% oxygen ?Induction Type: IV induction ?Ventilation: Mask ventilation without difficulty ?LMA: LMA inserted ?LMA Size: 4.0 ?Number of attempts: 1 ?Airway Equipment and Method: bite block ?Placement Confirmation: positive ETCO2 ?Tube secured with: Tape ?Dental Injury: Teeth and Oropharynx as per pre-operative assessment  ? ? ? ? ?

## 2022-03-09 NOTE — Discharge Instructions (Signed)

## 2022-03-09 NOTE — Anesthesia Postprocedure Evaluation (Signed)
Anesthesia Post Note ? ?Patient: Katelyn Hobbs ? ?Procedure(s) Performed: BILATERAL EXCISION OF EXCESS TISSUE CHEST WALL (Bilateral: Chest) ?REMOVAL PORT-A-CATH (Right: Chest) ? ?  ? ?Patient location during evaluation: PACU ?Anesthesia Type: General ?Level of consciousness: awake and alert ?Pain management: pain level controlled ?Vital Signs Assessment: post-procedure vital signs reviewed and stable ?Respiratory status: spontaneous breathing, nonlabored ventilation and respiratory function stable ?Cardiovascular status: blood pressure returned to baseline and stable ?Postop Assessment: no apparent nausea or vomiting ?Anesthetic complications: no ? ? ?No notable events documented. ? ?Last Vitals:  ?Vitals:  ? 03/09/22 1115 03/09/22 1234  ?BP: 106/62 (!) 104/59  ?Pulse: 76 81  ?Resp: 15 18  ?Temp:  36.7 ?C  ?SpO2: 97% 92%  ?  ?Last Pain:  ?Vitals:  ? 03/09/22 1115  ?TempSrc:   ?PainSc: 1   ? ? ?  ?  ?  ?  ?  ?  ? ?Fritz Cauthon,W. EDMOND ? ? ? ? ?

## 2022-03-09 NOTE — H&P (Signed)
?PROVIDER: Landry Corporal, MD ? ?MRN: [id number] ?DOB: Nov 20, 1975 ?Subjective  ? ?Chief Complaint: Post Operative Visit ? ? ?History of Present Illness: ?Katelyn Hobbs is a 46 y.o. female who is seen today for right breast cancer. The patient is a 46 year old white female who is about 6 months status post right mastectomy for a T2N0 right breast cancer that was essentially triple negative with a Ki-67 of 60%. She also had a left prophylactic mastectomy. She is tolerating her chemotherapy well. She would like to have the excess skin removed from the chest wall on both sides ? ? ? ?Review of Systems: ?A complete review of systems was obtained from the patient. I have reviewed this information and discussed as appropriate with the patient. See HPI as well for other ROS. ? ?ROS  ? ?Medical History: ?Past Medical History:  ?Diagnosis Date  ? Anemia  ? Anxiety  ? Depression  ? ?Patient Active Problem List  ?Diagnosis  ? Malignant neoplasm of upper-outer quadrant of right breast in female, estrogen receptor positive (CMS-HCC)  ? Mass of breast, right  ? Breast calcifications on mammogram  ? Elevated glucose level  ? Family history of breast cancer  ? Family history of melanoma  ? Family history of prostate cancer  ? Family history of uterine cancer  ? GAD (generalized anxiety disorder)  ? Encounter for preventive health examination  ? Genetic testing  ? Screen for STD (sexually transmitted disease)  ? Obesity (BMI 30.0-34.9), unspecified  ? Otitis media  ? Paroxysmal SVT (supraventricular tachycardia) (CMS-HCC)  ? PCOS (polycystic ovarian syndrome)  ? Primary insomnia  ? S/P total hysterectomy  ? Vitamin D deficiency  ? Cancer of right female breast (CMS-HCC)  ? Malignant neoplasm of upper-outer quadrant of right breast in female, estrogen receptor negative (CMS-HCC)  ? ?Past Surgical History:  ?Procedure Laterality Date  ? HYSTERECTOMY N/A  ?Partial 2016  ? ? ?Allergies  ?Allergen Reactions  ? Diphenhydramine Other (See  Comments)  ?Suspected elevated heart rate  ? Latex Dermatitis  ? Promethazine Hcl Other (See Comments)  ?Suspected elevated heart rate after surgery  ? ?Current Outpatient Medications on File Prior to Visit  ?Medication Sig Dispense Refill  ? ALPRAZolam (XANAX) 1 MG tablet TAKE 1 TABLET BY MOUTH TWICE DAILY AS NEEDED FOR ANXIETY  ? brompheniramine-pseudoephed-DM (BROMFED DM) 2-30-10 mg/5 mL syrup Take 10 mLs by mouth every 6 (six) hours as needed for Rhinitis 118 mL 0  ? cholecalciferol, vitamin D3, (VITAMIN D3 ORAL) Take by mouth.  ? citalopram (CELEXA) 20 MG tablet TAKE 1 TABLET BY MOUTH DAILY  ? HYDROcodone-acetaminophen (NORCO) 5-325 mg tablet Take one tablet at night for pain; may take up to every 6 hours as needed for pain if not working or driving 20 tablet 0  ? metaxalone (SKELAXIN) 800 mg tablet Take 1 tablet (800 mg total) by mouth 3 (three) times daily 30 tablet 0  ? ?No current facility-administered medications on file prior to visit.  ? ?Family History  ?Problem Relation Age of Onset  ? High blood pressure (Hypertension) Father  ? Hyperlipidemia (Elevated cholesterol) Father  ? Diabetes Father  ? High blood pressure (Hypertension) Sister  ? ? ?Social History  ? ?Tobacco Use  ?Smoking Status Never  ?Smokeless Tobacco Never  ? ? ?Social History  ? ?Socioeconomic History  ? Marital status: Single  ?Tobacco Use  ? Smoking status: Never  ? Smokeless tobacco: Never  ?Vaping Use  ? Vaping Use: Never used  ?Substance  and Sexual Activity  ? Alcohol use: Defer  ? Drug use: Defer  ? ?Objective:  ? ?There were no vitals filed for this visit.  ?There is no height or weight on file to calculate BMI. ? ?Physical Exam ?Vitals reviewed.  ?Constitutional:  ?General: She is not in acute distress. ?Appearance: Normal appearance.  ?HENT:  ?Head: Normocephalic and atraumatic.  ?Right Ear: External ear normal.  ?Left Ear: External ear normal.  ?Nose: Nose normal.  ?Mouth/Throat:  ?Mouth: Mucous membranes are moist.  ?Pharynx:  Oropharynx is clear.  ?Eyes:  ?General: No scleral icterus. ?Extraocular Movements: Extraocular movements intact.  ?Conjunctiva/sclera: Conjunctivae normal.  ?Pupils: Pupils are equal, round, and reactive to light.  ?Cardiovascular:  ?Rate and Rhythm: Normal rate and regular rhythm.  ?Pulses: Normal pulses.  ?Heart sounds: Normal heart sounds.  ?Pulmonary:  ?Effort: Pulmonary effort is normal. No respiratory distress.  ?Breath sounds: Normal breath sounds.  ?Abdominal:  ?General: Bowel sounds are normal.  ?Palpations: Abdomen is soft.  ?Tenderness: There is no abdominal tenderness.  ?Musculoskeletal:  ?General: No swelling, tenderness or deformity. Normal range of motion.  ?Cervical back: Normal range of motion and neck supple.  ?Skin: ?General: Skin is warm and dry.  ?Coloration: Skin is not jaundiced.  ?Neurological:  ?General: No focal deficit present.  ?Mental Status: She is alert and oriented to person, place, and time.  ?Psychiatric:  ?Mood and Affect: Mood normal.  ?Behavior: Behavior normal.  ? ? ? ?Breast: Both mastectomy incisions are healing nicely with no sign of infection. There is a very small seroma on the left side which is likely not large enough to try to aspirate. She does have some excess skin along the chest wall. There is no palpable mass of either chest wall. There is no palpable axillary, supraclavicular, or cervical lymphadenopathy. ? ?Labs, Imaging and Diagnostic Testing: ? ?Assessment and Plan:  ? ?Diagnoses and all orders for this visit: ? ?Malignant neoplasm of upper-outer quadrant of right breast in female, estrogen receptor positive (CMS-HCC) ?- [facility] Case Posting Request; Future ? ? ? ?The patient is about 6 months status post right mastectomy for breast cancer and left prophylactic mastectomy. She continues to do well with no clinical evidence of recurrence. At this point she will continue her chemotherapy treatment. When she is done she would like to have the excess skin removed from  the chest wall which I feel is very reasonable. I have discussed with her in detail the risks and benefits of the operation as well as some of the technical aspects and she understands and wishes to proceed. Otherwise we will see her back in 6 months for breast cancer follow-up ?

## 2022-03-09 NOTE — Op Note (Addendum)
03/09/2022  10:10 AM  PATIENT:  Katelyn Hobbs  46 y.o. female  PRE-OPERATIVE DIAGNOSIS:  EXCESS TISSUE CHEST WALL BILATERAL  POST-OPERATIVE DIAGNOSIS:  EXCESS TISSUE CHEST WALL BILATERAL  PROCEDURE:  Procedure(s): BILATERAL EXCISION OF EXCESS TISSUE CHEST WALL (Bilateral) SCAR REVISION REMOVAL PORT-A-CATH (Right)  SURGEON:  Surgeon(s) and Role:    * Jovita Kussmaul, MD - Primary  PHYSICIAN ASSISTANT:   ASSISTANTS: none   ANESTHESIA:   local and general  EBL:  minimal   BLOOD ADMINISTERED:none  DRAINS: (2) Blake drain(s) in the prepectoral space    LOCAL MEDICATIONS USED:  MARCAINE     SPECIMEN:  Source of Specimen:  medial and lateral tissue from chest wall bilaterally  DISPOSITION OF SPECIMEN:  PATHOLOGY  COUNTS:  YES  TOURNIQUET:  * No tourniquets in log *  DICTATION: .Dragon Dictation  After informed consent was obtained the patient was brought to the operating room and placed in the supine position on the operating table.  After adequate induction of general anesthesia the patient's bilateral chest area was prepped with ChloraPrep, allowed to dry, and draped in usual sterile manner.  An appropriate timeout was performed.  Attention was first turned to the right chest wall.  Medially and laterally there was some excess skin.  Each of the sites was then excised in an elliptical fashion with a 10 blade knife and the electrocautery down through the subcutaneous tissue.  During the dissection of the lateral tissue on the deep surface of the subcutaneous tissue we did enter the seroma cavity from the mastectomy.  Once each of these areas of tissue were removed they were marked with a short stitch on the superior surface and a long stitch on the lateral surface.  Each of these tissues was then sent to pathology for further evaluation.  Hemostasis was achieved using the Bovie electrocautery.  A small stab incision was made near the anterior axillary line inferior to the  operative bed.  A hemostat was placed through this opening and used to bring a 19 [place] round Blake drain into the old seroma cavity.  The drain was anchored to the skin with a 3-0 nylon stitch.  The drain was placed in the seroma cavity.  The subcutaneous tissue was then closed at each site with interrupted 3-0 Vicryl stitches.  The skin was then closed with a running 4-0 Monocryl subcuticular stitch.  The drain was placed to bulb suction and there was a good seal.  On the right chest wall there was also a Port-A-Cath.  The area around this was infiltrated with quarter percent Marcaine.  A small incision was made through her previous incision with a 10 blade knife.  The incision was carried through the subcutaneous tissue sharply with a 10 blade knife until the capsule surrounding the port was opened.  The port was then gently pushed out of its pocket and with gentle traction was removed from the patient without difficulty.  Pressure was held for several minutes until the area was completely hemostatic.  The deep layer of the incision was then closed with interrupted 3-0 Vicryl stitches.  The skin was closed with a running 4-0 Monocryl subcuticular stitch.  Attention was then turned to the left chest wall.  The patient had similar excess skin medially and laterally.  Each of the sites was excised sharply in an elliptical fashion with a 10 blade knife and electrocautery through the skin and subcutaneous tissue.  During the dissection of the lateral tissue  I also encountered the old seroma cavity from the mastectomy again.  Once both of the areas of tissue were removed they were marked with a short stitch on the superior surface and a long stitch on the lateral surface.  Both specimens were sent to pathology for further evaluation.  A small stab incision was made near the anterior axillary line inferior to the operative bed.  A hemostat was placed through this opening and used to bring a 19 [place] round Blake drain  into the old seroma cavity.  The drain was anchored to the skin with a 3-0 nylon stitch.  The subcutaneous tissue was then closed with interrupted 3-0 Vicryl stitches.  Hemostasis was achieved using the Bovie electrocautery.  The skin incisions were both closed with running 4-0 Monocryl subcuticular stitches.  Dermabond dressings and drain dressings were applied.  The patient tolerated the procedure well.  At the end of the case all needle sponge and instrument counts were correct.  The patient was then awakened and taken to recovery in stable condition. Of note the areas excised measured 6cm for the lateral tissue and 4cm for the medial tissue bilaterally.  PLAN OF CARE: Discharge to home after PACU  PATIENT DISPOSITION:  PACU - hemodynamically stable.   Delay start of Pharmacological VTE agent (>24hrs) due to surgical blood loss or risk of bleeding: not applicable

## 2022-03-10 ENCOUNTER — Encounter (HOSPITAL_BASED_OUTPATIENT_CLINIC_OR_DEPARTMENT_OTHER): Payer: Self-pay | Admitting: General Surgery

## 2022-03-10 ENCOUNTER — Other Ambulatory Visit: Payer: Self-pay | Admitting: *Deleted

## 2022-03-10 ENCOUNTER — Encounter: Payer: Self-pay | Admitting: Hematology and Oncology

## 2022-03-10 LAB — SURGICAL PATHOLOGY

## 2022-03-18 ENCOUNTER — Ambulatory Visit (AMBULATORY_SURGERY_CENTER): Payer: Self-pay | Admitting: *Deleted

## 2022-03-18 ENCOUNTER — Telehealth: Payer: Self-pay | Admitting: *Deleted

## 2022-03-18 VITALS — Ht 64.0 in | Wt 199.0 lb

## 2022-03-18 DIAGNOSIS — Z1211 Encounter for screening for malignant neoplasm of colon: Secondary | ICD-10-CM

## 2022-03-18 MED ORDER — NA SULFATE-K SULFATE-MG SULF 17.5-3.13-1.6 GM/177ML PO SOLN: 1.0000 | ORAL | 0 refills | Status: DC

## 2022-03-18 NOTE — Telephone Encounter (Signed)
Thank you for the note.  We can proceed with colonoscopy as scheduled.  HD

## 2022-03-18 NOTE — Telephone Encounter (Signed)
Dr.Danis,  Patient is for a direct screening colon with you at [facility] on 04/15/2022. Her last chemo for breast cancer was February 06, 2022. Pt states she is doing well. Had surgery on 03/09/2022 for breast tissue excess. No current cancer treatments. Okay to proceed with colon as scheduled? Thank you, Ruey Storer pv

## 2022-03-18 NOTE — Progress Notes (Signed)
Patient is here in-person for PV. Patient denies any allergies to eggs or soy. Patient denies any problems with anesthesia/sedation. Patient is not on any oxygen at home. Patient is not taking any diet/weight loss medications or blood thinners. Patient is aware of our care-partner policy and [id number] safety protocol.  Last chemo per pt 02/06/2022. Pt will use zofran she has at home with prep.  EMMI education assigned to the patient for the procedure, sent to [facility].   Patient is COVID-19 vaccinated.

## 2022-03-18 NOTE — Telephone Encounter (Signed)
Noted! Thank you

## 2022-03-25 DIAGNOSIS — F419 Anxiety disorder, unspecified: Secondary | ICD-10-CM | POA: Diagnosis not present

## 2022-03-25 DIAGNOSIS — C44501 Unspecified malignant neoplasm of skin of breast: Secondary | ICD-10-CM | POA: Diagnosis not present

## 2022-03-25 DIAGNOSIS — G479 Sleep disorder, unspecified: Secondary | ICD-10-CM | POA: Diagnosis not present

## 2022-03-31 ENCOUNTER — Encounter: Payer: Self-pay | Admitting: Rehabilitation

## 2022-03-31 ENCOUNTER — Ambulatory Visit: Payer: BC Managed Care – PPO | Attending: Hematology and Oncology | Admitting: Rehabilitation

## 2022-03-31 DIAGNOSIS — Z483 Aftercare following surgery for neoplasm: Secondary | ICD-10-CM | POA: Insufficient documentation

## 2022-03-31 NOTE — Therapy (Signed)
OUTPATIENT PHYSICAL THERAPY SOZO SCREENING NOTE   Patient Name: Katelyn Hobbs MRN: [id number] DOB:07/19/1976, 46 y.o., female Today's Date: 03/31/2022  PCP: Crecencio Mc, MD REFERRING PROVIDER: Nicholas Lose, MD   PT End of Session - 03/31/22 0806     Visit Number 4   screen only   PT Start Time 0809    PT Stop Time 0812    PT Time Calculation (min) 3 min    Activity Tolerance Patient tolerated treatment well    Behavior During Therapy [facility] for tasks assessed/performed             Past Medical History:  Diagnosis Date   Abnormal uterine bleeding    ADHD (attention deficit hyperactivity disorder)    Anemia    Anxiety    Bacterial vaginosis    Cancer ([place]) 06/2021   breast cancer   Complication of anesthesia    "woke up with high heart rate" thinks it was phenergan   Depression    Endometriosis    Family history of breast cancer 07/17/2021   Family history of melanoma 07/17/2021   Family history of prostate cancer 07/17/2021   Family history of uterine cancer 07/17/2021   Hyperlipidemia    Migraine    PCOS (polycystic ovarian syndrome)    PONV (postoperative nausea and vomiting)    Stroke [facility])    "old stroke" per MRI   Past Surgical History:  Procedure Laterality Date   IR IMAGING GUIDED PORT INSERTION  09/09/2021   LAPAROSCOPY  1993   MASS EXCISION Bilateral 03/09/2022   Procedure: BILATERAL EXCISION OF EXCESS TISSUE CHEST WALL;  Surgeon: Jovita Kussmaul, MD;  Location: [place];  Service: General;  Laterality: Bilateral;   MASTECTOMY W/ SENTINEL NODE BIOPSY Right 08/18/2021   Procedure: RIGHT MASTECTOMY WITH RIGHT SENTINEL LYMPH NODE BIOPSY;  Surgeon: Jovita Kussmaul, MD;  Location: [place];  Service: General;  Laterality: Right;   PORT-A-CATH REMOVAL Right 03/09/2022   Procedure: REMOVAL PORT-A-CATH;  Surgeon: Jovita Kussmaul, MD;  Location: [place];  Service: General;  Laterality: Right;   SALPINGECTOMY Bilateral 2015    TOTAL MASTECTOMY Left 08/18/2021   Procedure: LEFT TOTAL MASTECTOMY;  Surgeon: Jovita Kussmaul, MD;  Location: [place];  Service: General;  Laterality: Left;   VAGINAL HYSTERECTOMY  2017   Patient Active Problem List   Diagnosis Date Noted   Stroke (cerebrum) ([place]) 02/16/2022   Port-A-Cath in place 10/21/2021   Cancer of right female breast ([place]) 08/18/2021   Family history of uterine cancer 07/17/2021   Family history of breast cancer 07/17/2021   Family history of prostate cancer 07/17/2021   Family history of melanoma 07/17/2021   Genetic testing 07/17/2021   Malignant neoplasm of upper-outer quadrant of right breast in female, estrogen receptor negative ([place]) 07/14/2021   Otitis media 06/11/2021   Mass of breast, right 06/11/2021   Breast calcifications on mammogram 03/16/2021   Encounter for preventive health examination 01/08/2021   Primary insomnia 09/24/2020   PCOS (polycystic ovarian syndrome) 04/16/2020   Paroxysmal SVT (supraventricular tachycardia) ([place]) 04/16/2020   Screen for STD (sexually transmitted disease) 04/16/2020   S/P total hysterectomy 04/16/2020   GAD (generalized anxiety disorder) 11/11/2019   Vitamin D deficiency 09/21/2015   Elevated glucose level 09/21/2015   Obesity (BMI 30.0-34.9) 09/17/2015    REFERRING DIAG: right breast cancer at risk for lymphedema  THERAPY DIAG:  Aftercare following surgery for neoplasm  PERTINENT HISTORY: Rt mastectomy 08/18/21  due to Grade 3 triple negative breast cancer. 0/3LN negative. Chemo completed  PRECAUTIONS: right UE Lymphedema risk,   SUBJECTIVE: I am feeling really good  PAIN:  Are you having pain? No  SOZO SCREENING: Patient was assessed today using the SOZO machine to determine the lymphedema index score. This was compared to her baseline score. It was determined that she is back within the recommended range when compared to her baseline and so she will continue wearing her compression sleeve and gaunlet x 6  more weeks due to upcoming surgery and then recheck SOZO again in 3 months.     Stark Bray, PT 03/31/2022, 8:13 AM

## 2022-04-01 ENCOUNTER — Other Ambulatory Visit: Payer: Self-pay

## 2022-04-02 ENCOUNTER — Encounter: Payer: Self-pay | Admitting: Hematology and Oncology

## 2022-04-02 ENCOUNTER — Other Ambulatory Visit: Payer: Self-pay | Admitting: *Deleted

## 2022-04-02 ENCOUNTER — Encounter: Payer: Self-pay | Admitting: *Deleted

## 2022-04-02 DIAGNOSIS — G4730 Sleep apnea, unspecified: Secondary | ICD-10-CM

## 2022-04-03 ENCOUNTER — Other Ambulatory Visit: Payer: Self-pay | Admitting: *Deleted

## 2022-04-03 DIAGNOSIS — G4730 Sleep apnea, unspecified: Secondary | ICD-10-CM

## 2022-04-06 DIAGNOSIS — J0100 Acute maxillary sinusitis, unspecified: Secondary | ICD-10-CM | POA: Diagnosis not present

## 2022-04-12 ENCOUNTER — Encounter: Payer: Self-pay | Admitting: Certified Registered Nurse Anesthetist

## 2022-04-13 ENCOUNTER — Encounter: Payer: BC Managed Care – PPO | Admitting: Gastroenterology

## 2022-04-13 ENCOUNTER — Encounter: Payer: Self-pay | Admitting: Gastroenterology

## 2022-04-13 ENCOUNTER — Ambulatory Visit (HOSPITAL_BASED_OUTPATIENT_CLINIC_OR_DEPARTMENT_OTHER): Payer: BC Managed Care – PPO | Admitting: Internal Medicine

## 2022-04-13 DIAGNOSIS — G4730 Sleep apnea, unspecified: Secondary | ICD-10-CM

## 2022-04-15 ENCOUNTER — Encounter: Payer: Self-pay | Admitting: Gastroenterology

## 2022-04-15 ENCOUNTER — Ambulatory Visit (AMBULATORY_SURGERY_CENTER): Payer: BC Managed Care – PPO | Admitting: Gastroenterology

## 2022-04-15 VITALS — BP 122/71 | HR 76 | Temp 98.0°F | Resp 16 | Ht 64.0 in | Wt 199.0 lb

## 2022-04-15 DIAGNOSIS — D120 Benign neoplasm of cecum: Secondary | ICD-10-CM | POA: Diagnosis not present

## 2022-04-15 DIAGNOSIS — Z1211 Encounter for screening for malignant neoplasm of colon: Secondary | ICD-10-CM

## 2022-04-15 DIAGNOSIS — K635 Polyp of colon: Secondary | ICD-10-CM | POA: Diagnosis not present

## 2022-04-15 MED ORDER — SODIUM CHLORIDE 0.9 % IV SOLN: 500.0000 mL | Freq: Once | INTRAVENOUS | Status: DC

## 2022-04-15 NOTE — Progress Notes (Signed)
Report given to PACU, vss 

## 2022-04-15 NOTE — Progress Notes (Signed)
VS by CW  Pt's states no medical or surgical changes since previsit or office visit.  

## 2022-04-15 NOTE — Op Note (Signed)
[place] Patient Name: Athea Haley Procedure Date: 04/15/2022 8:39 AM MRN: [id number] Endoscopist: Mallie Mussel L. Loletha Carrow , MD Age: 46 Referring MD:  Date of Birth: 1976/07/13 Gender: Female Account #: [id number] Procedure:                Colonoscopy Indications:              Screening for colorectal malignant neoplasm, This                            is the patient's first colonoscopy Medicines:                Monitored Anesthesia Care Procedure:                Pre-Anesthesia Assessment:                           - Prior to the procedure, a History and Physical                            was performed, and patient medications and                            allergies were reviewed. The patient's tolerance of                            previous anesthesia was also reviewed. The risks                            and benefits of the procedure and the sedation                            options and risks were discussed with the patient.                            All questions were answered, and informed consent                            was obtained. Prior Anticoagulants: The patient has                            taken no previous anticoagulant or antiplatelet                            agents. ASA Grade Assessment: III - A patient with                            severe systemic disease. After reviewing the risks                            and benefits, the patient was deemed in                            satisfactory condition to undergo the procedure.  After obtaining informed consent, the colonoscope                            was passed under direct vision. Throughout the                            procedure, the patient's blood pressure, pulse, and                            oxygen saturations were monitored continuously. The                            CF [id number] #[id number] was introduced through the anus                            and advanced to the  the cecum, identified by                            appendiceal orifice and ileocecal valve. The                            colonoscopy was performed without difficulty. The                            patient tolerated the procedure well. The quality                            of the bowel preparation was good. The ileocecal                            valve, appendiceal orifice, and rectum were                            photographed. The bowel preparation used was SUPREP. Scope In: 8:45:30 AM Scope Out: 9:00:25 AM Scope Withdrawal Time: 0 hours 12 minutes 41 seconds  Total Procedure Duration: 0 hours 14 minutes 55 seconds  Findings:                 The perianal and digital rectal examinations were                            normal.                           A 16-18 mm polyp was found in the cecum. The polyp                            was semi-sessile. The polyp was removed with a                            piecemeal technique using a cold snare. Resection                            and retrieval were complete.  Repeat examination of right colon under NBI                            performed.                           The exam was otherwise without abnormality on                            direct and retroflexion views. Complications:            No immediate complications. Estimated Blood Loss:     Estimated blood loss was minimal. Impression:               - One 16-18 mm polyp in the cecum, removed                            piecemeal using a cold snare. Resected and                            retrieved.                           - The examination was otherwise normal on direct                            and retroflexion views. Recommendation:           - Patient has a contact number available for                            emergencies. The signs and symptoms of potential                            delayed complications were discussed with the                             patient. Return to normal activities tomorrow.                            Written discharge instructions were provided to the                            patient.                           - Resume previous diet.                           - Continue present medications.                           - Await pathology results.                           - Repeat colonoscopy is recommended for  surveillance. The colonoscopy date will be                            determined after pathology results from today's                            exam become available for review. Charlissa Petros L. Loletha Carrow, MD 04/15/2022 9:04:08 AM This report has been signed electronically.

## 2022-04-15 NOTE — Progress Notes (Signed)
Called to room to assist during endoscopic procedure.  Patient ID and intended procedure confirmed with present staff. Received instructions for my participation in the procedure from the performing physician.  

## 2022-04-15 NOTE — Progress Notes (Signed)
History and Physical:  This patient presents for endoscopic testing for: Encounter Diagnosis  Name Primary?   Special screening for malignant neoplasms, colon Yes    Average risk for CRC - first colonoscopy  Patient is otherwise without complaints or active issues today.   Past Medical History: Past Medical History:  Diagnosis Date   Abnormal uterine bleeding    ADHD (attention deficit hyperactivity disorder)    Anemia    Anxiety    Bacterial vaginosis    Cancer ([place]) 06/2021   breast cancer   Complication of anesthesia    "woke up with high heart rate" thinks it was phenergan   Depression    Endometriosis    Family history of breast cancer 07/17/2021   Family history of melanoma 07/17/2021   Family history of prostate cancer 07/17/2021   Family history of uterine cancer 07/17/2021   Hyperlipidemia    Migraine    PCOS (polycystic ovarian syndrome)    PONV (postoperative nausea and vomiting)    Stroke [facility])    "old stroke" per MRI     Past Surgical History: Past Surgical History:  Procedure Laterality Date   IR IMAGING GUIDED PORT INSERTION  09/09/2021   LAPAROSCOPY  1993   MASS EXCISION Bilateral 03/09/2022   Procedure: BILATERAL EXCISION OF EXCESS TISSUE CHEST WALL;  Surgeon: Jovita Kussmaul, MD;  Location: [place];  Service: General;  Laterality: Bilateral;   MASTECTOMY W/ SENTINEL NODE BIOPSY Right 08/18/2021   Procedure: RIGHT MASTECTOMY WITH RIGHT SENTINEL LYMPH NODE BIOPSY;  Surgeon: Jovita Kussmaul, MD;  Location: [place];  Service: General;  Laterality: Right;   PORT-A-CATH REMOVAL Right 03/09/2022   Procedure: REMOVAL PORT-A-CATH;  Surgeon: Jovita Kussmaul, MD;  Location: [place];  Service: General;  Laterality: Right;   SALPINGECTOMY Bilateral 2015   TOTAL MASTECTOMY Left 08/18/2021   Procedure: LEFT TOTAL MASTECTOMY;  Surgeon: Jovita Kussmaul, MD;  Location: [place];  Service: General;  Laterality: Left;   VAGINAL HYSTERECTOMY   2017    Allergies: Allergies  Allergen Reactions   Benadryl [Diphenhydramine] Other (See Comments)    Suspected elevated heart rate   Latex Itching   Phenergan [Promethazine Hcl] Other (See Comments)    Suspected elevated heart rate after surgery    Outpatient Meds: Current Outpatient Medications  Medication Sig Dispense Refill   ALPRAZolam (XANAX XR) 2 MG 24 hr tablet TAKE 1 TABLET BY MOUTH EVERYDAY AT BEDTIME 30 tablet 2   bimatoprost (LATISSE) 0.03 % ophthalmic solution Place 1 application. into both eyes at bedtime. Apply evenly along the skin of the upper eyelid at base of eyelashes once daily at bedtime; repeat procedure for second eye (use a clean applicator). 3 mL 12   citalopram (CELEXA) 40 MG tablet Take 1 tablet (40 mg total) by mouth daily. 90 tablet 1   loratadine (CLARITIN) 10 MG tablet Take 10 mg by mouth daily.     acetaminophen (TYLENOL) 500 MG tablet Take 1,000 mg by mouth every 8 (eight) hours as needed for moderate pain.     ergocalciferol (DRISDOL) 1.25 MG (50000 UT) capsule Take 1 capsule (50,000 Units total) by mouth once a week. 12 capsule 0   ibuprofen (ADVIL) 200 MG tablet Take 800 mg by mouth every 8 (eight) hours as needed for moderate pain.     Multiple Vitamins-Minerals (MULTIVITAMIN WITH MINERALS) tablet Take 1 tablet by mouth daily.     ondansetron (ZOFRAN) 8 MG tablet Take 8  mg by mouth 2 (two) times daily as needed. (Patient not taking: Reported on 04/15/2022)     prochlorperazine (COMPAZINE) 10 MG tablet Take 10 mg by mouth every 6 (six) hours as needed. (Patient not taking: Reported on 04/15/2022)     vitamin B-12 (CYANOCOBALAMIN) 500 MCG tablet Take 500 mcg by mouth daily.     Current Facility-Administered Medications  Medication Dose Route Frequency Provider Last Rate Last Admin   0.9 %  sodium chloride infusion  500 mL Intravenous Once Nelida Meuse III, MD          ___________________________________________________________________ Objective    Exam:  BP 104/65   Pulse 87   Temp 98 F (36.7 C)   Ht '5\' 4"'$  (1.626 m)   Wt 199 lb (90.3 kg)   SpO2 96%   BMI 34.16 kg/m   CV: RRR without murmur, S1/S2 Resp: clear to auscultation bilaterally, normal RR and effort noted GI: soft, no tenderness, with active bowel sounds.   Assessment: Encounter Diagnosis  Name Primary?   Special screening for malignant neoplasms, colon Yes     Plan: Colonoscopy  The benefits and risks of the planned procedure were described in detail with the patient or (when appropriate) their health care proxy.  Risks were outlined as including, but not limited to, bleeding, infection, perforation, adverse medication reaction leading to cardiac or pulmonary decompensation, pancreatitis (if ERCP).  The limitation of incomplete mucosal visualization was also discussed.  No guarantees or warranties were given.    The patient is appropriate for an endoscopic procedure in the ambulatory setting.   - Wilfrid Lund, MD

## 2022-04-15 NOTE — Patient Instructions (Signed)
YOU HAD AN ENDOSCOPIC PROCEDURE TODAY AT [facility]:   Refer to the procedure report that was given to you for any specific questions about what was found during the examination.  If the procedure report does not answer your questions, please call your gastroenterologist to clarify.  If you requested that your care partner not be given the details of your procedure findings, then the procedure report has been included in a sealed envelope for you to review at your convenience later.  YOU SHOULD EXPECT: Some feelings of bloating in the abdomen. Passage of more gas than usual.  Walking can help get rid of the air that was put into your GI tract during the procedure and reduce the bloating. If you had a lower endoscopy (such as a colonoscopy or flexible sigmoidoscopy) you may notice spotting of blood in your stool or on the toilet paper. If you underwent a bowel prep for your procedure, you may not have a normal bowel movement for a few days.  Please Note:  You might notice some irritation and congestion in your nose or some drainage.  This is from the oxygen used during your procedure.  There is no need for concern and it should clear up in a day or so.  SYMPTOMS TO REPORT IMMEDIATELY:  Following lower endoscopy (colonoscopy or flexible sigmoidoscopy):  Excessive amounts of blood in the stool  Significant tenderness or worsening of abdominal pains  Swelling of the abdomen that is new, acute  Fever of 100F or higher  Following upper endoscopy (EGD)  Vomiting of blood or coffee ground material  New chest pain or pain under the shoulder blades  Painful or persistently difficult swallowing  New shortness of breath  Fever of 100F or higher  Black, tarry-looking stools  For urgent or emergent issues, a gastroenterologist can be reached at any hour by calling [phone]. Do not use MyChart messaging for urgent concerns.    DIET:  We do recommend a small meal at first, but  then you may proceed to your regular diet.  Drink plenty of fluids but you should avoid alcoholic beverages for 24 hours.  ACTIVITY:  You should plan to take it easy for the rest of today and you should NOT DRIVE or use heavy machinery until tomorrow (because of the sedation medicines used during the test).    FOLLOW UP: Our staff will call the number listed on your records 24-72 hours following your procedure to check on you and address any questions or concerns that you may have regarding the information given to you following your procedure. If we do not reach you, we will leave a message.  We will attempt to reach you two times.  During this call, we will ask if you have developed any symptoms of COVID 19. If you develop any symptoms (ie: fever, flu-like symptoms, shortness of breath, cough etc.) before then, please call [phone].  If you test positive for Covid 19 in the 2 weeks post procedure, please call and report this information to [place].    If any biopsies were taken you will be contacted by phone or by letter within the next 1-3 weeks.  Please call us at [phone] if you have not heard about the biopsies in 3 weeks.    SIGNATURES/CONFIDENTIALITY: You and/or your care partner have signed paperwork which will be entered into your electronic medical record.  These signatures attest to the fact that that the information above on your After  Visit Summary has been reviewed and is understood.  Full responsibility of the confidentiality of this discharge information lies with you and/or your care-partner.

## 2022-04-16 ENCOUNTER — Other Ambulatory Visit: Payer: Self-pay | Admitting: Nurse Practitioner

## 2022-04-16 ENCOUNTER — Telehealth: Payer: Self-pay | Admitting: *Deleted

## 2022-04-16 NOTE — Telephone Encounter (Signed)
  Follow up Call-     04/15/2022    7:41 AM  Call back number  Post procedure Call Back phone  # [phone]  Permission to leave phone message Yes     Patient questions:  Do you have a fever, pain , or abdominal swelling? No. Pain Score  0 *  Have you tolerated food without any problems? Yes.    Have you been able to return to your normal activities? Yes.    Do you have any questions about your discharge instructions: Diet   No. Medications  No. Follow up visit  No.  Do you have questions or concerns about your Care? No.  Actions: * If pain score is 4 or above: No action needed, pain <4.

## 2022-04-17 ENCOUNTER — Encounter: Payer: Self-pay | Admitting: Internal Medicine

## 2022-04-17 ENCOUNTER — Ambulatory Visit (INDEPENDENT_AMBULATORY_CARE_PROVIDER_SITE_OTHER): Payer: BC Managed Care – PPO | Admitting: Internal Medicine

## 2022-04-17 ENCOUNTER — Encounter: Payer: Self-pay | Admitting: Gastroenterology

## 2022-04-17 VITALS — BP 102/66 | HR 92 | Temp 98.5°F | Ht 64.0 in | Wt 194.8 lb

## 2022-04-17 DIAGNOSIS — E785 Hyperlipidemia, unspecified: Secondary | ICD-10-CM | POA: Diagnosis not present

## 2022-04-17 DIAGNOSIS — F411 Generalized anxiety disorder: Secondary | ICD-10-CM

## 2022-04-17 DIAGNOSIS — R5383 Other fatigue: Secondary | ICD-10-CM

## 2022-04-17 DIAGNOSIS — F5101 Primary insomnia: Secondary | ICD-10-CM

## 2022-04-17 DIAGNOSIS — E559 Vitamin D deficiency, unspecified: Secondary | ICD-10-CM | POA: Diagnosis not present

## 2022-04-17 DIAGNOSIS — E669 Obesity, unspecified: Secondary | ICD-10-CM | POA: Diagnosis not present

## 2022-04-17 DIAGNOSIS — Z0000 Encounter for general adult medical examination without abnormal findings: Secondary | ICD-10-CM

## 2022-04-17 DIAGNOSIS — R7301 Impaired fasting glucose: Secondary | ICD-10-CM

## 2022-04-17 DIAGNOSIS — Z8673 Personal history of transient ischemic attack (TIA), and cerebral infarction without residual deficits: Secondary | ICD-10-CM

## 2022-04-17 DIAGNOSIS — E538 Deficiency of other specified B group vitamins: Secondary | ICD-10-CM

## 2022-04-17 DIAGNOSIS — C50911 Malignant neoplasm of unspecified site of right female breast: Secondary | ICD-10-CM

## 2022-04-17 DIAGNOSIS — Z171 Estrogen receptor negative status [ER-]: Secondary | ICD-10-CM

## 2022-04-17 LAB — HEMOGLOBIN A1C: Hgb A1c MFr Bld: 5 % (ref 4.6–6.5)

## 2022-04-17 LAB — LIPID PANEL
Cholesterol: 234 mg/dL — ABNORMAL HIGH (ref 0–200)
HDL: 46.5 mg/dL (ref >39.00)
LDL Cholesterol: 151 mg/dL — ABNORMAL HIGH (ref 0–99)
NonHDL: 187.92
Total CHOL/HDL Ratio: 5
Triglycerides: 186 mg/dL — ABNORMAL HIGH (ref 0.0–149.0)
VLDL: 37.2 mg/dL (ref 0.0–40.0)

## 2022-04-17 LAB — COMPREHENSIVE METABOLIC PANEL
ALT: 30 U/L (ref 0–35)
AST: 21 U/L (ref 0–37)
Albumin: 4.6 g/dL (ref 3.5–5.2)
Alkaline Phosphatase: 67 U/L (ref 39–117)
BUN: 11 mg/dL (ref 6–23)
CO2: 27 mEq/L (ref 19–32)
Calcium: 9.7 mg/dL (ref 8.4–10.5)
Chloride: 104 mEq/L (ref 96–112)
Creatinine, Ser: 0.66 mg/dL (ref 0.40–1.20)
GFR: 104.44 mL/min (ref >60.00)
Glucose, Bld: 100 mg/dL — ABNORMAL HIGH (ref 70–99)
Potassium: 4 mEq/L (ref 3.5–5.1)
Sodium: 140 mEq/L (ref 135–145)
Total Bilirubin: 0.4 mg/dL (ref 0.2–1.2)
Total Protein: 7.4 g/dL (ref 6.0–8.3)

## 2022-04-17 LAB — B12 AND FOLATE PANEL
Folate: 18.5 ng/mL (ref >5.9)
Vitamin B-12: 321 pg/mL (ref 211–911)

## 2022-04-17 LAB — VITAMIN D 25 HYDROXY (VIT D DEFICIENCY, FRACTURES): VITD: 28.95 ng/mL — ABNORMAL LOW (ref 30.00–100.00)

## 2022-04-17 LAB — TSH: TSH: 1.84 u[IU]/mL (ref 0.35–5.50)

## 2022-04-17 MED ORDER — DOXYCYCLINE HYCLATE 100 MG PO TABS: 100.0000 mg | ORAL_TABLET | Freq: Two times a day (BID) | ORAL | 0 refills | Status: DC

## 2022-04-17 NOTE — Assessment & Plan Note (Addendum)
Right cerebellar infarct was an incidental finding on MRI Brain done in April to rule out mets as a cause of her headaches.  She has been advised to start taking 81 mg  aspirin daily,an no further workup except  A SLEEP STUDY has been re ordered (first one was done at home with malfunctioining recorders)

## 2022-04-17 NOTE — Assessment & Plan Note (Addendum)
Taking weekly megadose intermittently,  Checking level today. Level s still slightly  Low.  Will give  her the option of 2000 Ius daily of 50,000 IUs weekly

## 2022-04-17 NOTE — Assessment & Plan Note (Signed)
She experienced significant weight gain due to prolonged prednisone use and is now losing weight using an intermittent fasting diet , eating once a day. She has lost 6 lbs in the last 5 weeks.  Checking thyroid,  A1c and lipids

## 2022-04-23 ENCOUNTER — Encounter (HOSPITAL_BASED_OUTPATIENT_CLINIC_OR_DEPARTMENT_OTHER): Payer: BC Managed Care – PPO | Admitting: Internal Medicine

## 2022-04-23 DIAGNOSIS — F419 Anxiety disorder, unspecified: Secondary | ICD-10-CM | POA: Diagnosis not present

## 2022-04-23 DIAGNOSIS — G479 Sleep disorder, unspecified: Secondary | ICD-10-CM | POA: Diagnosis not present

## 2022-05-18 ENCOUNTER — Encounter: Payer: Self-pay | Admitting: Hematology and Oncology

## 2022-05-19 DIAGNOSIS — F419 Anxiety disorder, unspecified: Secondary | ICD-10-CM | POA: Diagnosis not present

## 2022-05-19 DIAGNOSIS — Z6379 Other stressful life events affecting family and household: Secondary | ICD-10-CM | POA: Diagnosis not present

## 2022-05-19 DIAGNOSIS — G479 Sleep disorder, unspecified: Secondary | ICD-10-CM | POA: Diagnosis not present

## 2022-05-28 ENCOUNTER — Encounter: Payer: Self-pay | Admitting: Internal Medicine

## 2022-05-28 ENCOUNTER — Encounter: Payer: Self-pay | Admitting: Hematology and Oncology

## 2022-05-28 ENCOUNTER — Telehealth: Payer: Self-pay | Admitting: Adult Health

## 2022-05-28 NOTE — Telephone Encounter (Signed)
I called and spoke with patient. I looked through the schedule of providers today & there was no openings unfortunately. Patient is scheduled tomorrow morning for VV with Dr. Olivia Mackie. If she decides to do a VV though mychart she will call back to cancel tomorrow's appointment.

## 2022-05-28 NOTE — Telephone Encounter (Signed)
Rescheduled appointment per patient due to being covid +. Patient is aware of the changes made to her upcoming appointment.

## 2022-05-29 ENCOUNTER — Telehealth (INDEPENDENT_AMBULATORY_CARE_PROVIDER_SITE_OTHER): Payer: BC Managed Care – PPO | Admitting: Internal Medicine

## 2022-05-29 ENCOUNTER — Encounter: Payer: Self-pay | Admitting: Internal Medicine

## 2022-05-29 VITALS — Temp 101.0°F | Ht 64.0 in | Wt 194.0 lb

## 2022-05-29 DIAGNOSIS — U071 COVID-19: Secondary | ICD-10-CM | POA: Diagnosis not present

## 2022-05-29 MED ORDER — MOLNUPIRAVIR EUA 200MG CAPSULE: 4.0000 | ORAL_CAPSULE | Freq: Two times a day (BID) | ORAL | 0 refills | Status: AC

## 2022-05-29 NOTE — Progress Notes (Signed)
Virtual Visit via Video Note  I connected with Katelyn Hobbs  on 05/29/22 at  8:20 AM EDT by a video enabled telemedicine application and verified that I am speaking with the correct person using two identifiers.  Location patient: [place] Location provider:work or home office Persons participating in the virtual visit: patient, provider  I discussed the limitations and requested verbal permission for telemedicine visit. The patient expressed understanding and agreed to proceed.   HPI:  Acute telemedicine visit for : Covid + 05/26/22 exposed to cowork at work and she was wearing a mask x hours around co  worker no sob, has had vere and taking tylenol and drinking water and trying robitussin dm she had 4/4 covid 19 vaccines  Last breast cancer tx complete in 01/2022 she works for [employer] dermat[employer]   -Pertinent past medical history: see below -Pertinent medication allergies: Allergies  Allergen Reactions   Benadryl [Diphenhydramine] Other (See Comments)    Suspected elevated heart rate   Latex Itching   Phenergan [Promethazine Hcl] Other (See Comments)    Suspected elevated heart rate after surgery   -COVID-19 vaccine status:  Immunization History  Administered Date(s) Administered   Influenza-Unspecified 07/27/2019, 07/26/2020   Moderna SARS-COV2 Booster Vaccination 08/23/2020   Moderna Sars-Covid-2 Vaccination 10/26/2019, 11/24/2019   Tdap 06/01/2017     ROS: See pertinent positives and negatives per HPI.  Past Medical History:  Diagnosis Date   Abnormal uterine bleeding    ADHD (attention deficit hyperactivity disorder)    Anemia    Anxiety    Bacterial vaginosis    Cancer ([place]) 06/2021   breast cancer   Complication of anesthesia    "woke up with high heart rate" thinks it was phenergan   Depression    Endometriosis    Family history of breast cancer 07/17/2021   Family history of melanoma 07/17/2021   Family history of prostate cancer 07/17/2021   Family history  of uterine cancer 07/17/2021   Hyperlipidemia    Migraine    PCOS (polycystic ovarian syndrome)    PONV (postoperative nausea and vomiting)    Stroke [facility])    "old stroke" per MRI    Past Surgical History:  Procedure Laterality Date   IR IMAGING GUIDED PORT INSERTION  09/09/2021   LAPAROSCOPY  1993   MASS EXCISION Bilateral 03/09/2022   Procedure: BILATERAL EXCISION OF EXCESS TISSUE CHEST WALL;  Surgeon: Jovita Kussmaul, MD;  Location: [place];  Service: General;  Laterality: Bilateral;   MASTECTOMY W/ SENTINEL NODE BIOPSY Right 08/18/2021   Procedure: RIGHT MASTECTOMY WITH RIGHT SENTINEL LYMPH NODE BIOPSY;  Surgeon: Jovita Kussmaul, MD;  Location: [place];  Service: General;  Laterality: Right;   PORT-A-CATH REMOVAL Right 03/09/2022   Procedure: REMOVAL PORT-A-CATH;  Surgeon: Jovita Kussmaul, MD;  Location: [place];  Service: General;  Laterality: Right;   SALPINGECTOMY Bilateral 2015   TOTAL MASTECTOMY Left 08/18/2021   Procedure: LEFT TOTAL MASTECTOMY;  Surgeon: Jovita Kussmaul, MD;  Location: [place];  Service: General;  Laterality: Left;   VAGINAL HYSTERECTOMY  2017     Current Outpatient Medications:    acetaminophen (TYLENOL) 500 MG tablet, Take 1,000 mg by mouth every 8 (eight) hours as needed for moderate pain., Disp: , Rfl:    ALPRAZolam (XANAX XR) 2 MG 24 hr tablet, TAKE 1 TABLET BY MOUTH EVERYDAY AT BEDTIME, Disp: 30 tablet, Rfl: 2   bimatoprost (LATISSE) 0.03 % ophthalmic solution, Place 1 application. into  both eyes at bedtime. Apply evenly along the skin of the upper eyelid at base of eyelashes once daily at bedtime; repeat procedure for second eye (use a clean applicator)., Disp: 3 mL, Rfl: 12   citalopram (CELEXA) 40 MG tablet, Take 1 tablet (40 mg total) by mouth daily., Disp: 90 tablet, Rfl: 1   doxycycline (VIBRA-TABS) 100 MG tablet, Take 1 tablet (100 mg total) by mouth 2 (two) times daily., Disp: 20 tablet, Rfl: 0   ergocalciferol  (DRISDOL) 1.25 MG (50000 UT) capsule, Take 1 capsule (50,000 Units total) by mouth once a week., Disp: 12 capsule, Rfl: 0   ibuprofen (ADVIL) 200 MG tablet, Take 800 mg by mouth every 8 (eight) hours as needed for moderate pain., Disp: , Rfl:    loratadine (CLARITIN) 10 MG tablet, Take 10 mg by mouth daily., Disp: , Rfl:    molnupiravir EUA (LAGEVRIO) 200 mg CAPS capsule, Take 4 capsules (800 mg total) by mouth 2 (two) times daily for 5 days., Disp: 40 capsule, Rfl: 0   Multiple Vitamins-Minerals (MULTIVITAMIN WITH MINERALS) tablet, Take 1 tablet by mouth daily., Disp: , Rfl:    ondansetron (ZOFRAN) 8 MG tablet, Take 8 mg by mouth 2 (two) times daily as needed., Disp: , Rfl:    prochlorperazine (COMPAZINE) 10 MG tablet, Take 10 mg by mouth every 6 (six) hours as needed., Disp: , Rfl:    vitamin B-12 (CYANOCOBALAMIN) 500 MCG tablet, Take 500 mcg by mouth daily., Disp: , Rfl:   EXAM:  VITALS per patient if applicable:  GENERAL: alert, oriented, appears well and in no acute distress  HEENT: atraumatic, conjunttiva clear, no obvious abnormalities on inspection of external nose and ears  NECK: normal movements of the head and neck  LUNGS: on inspection no signs of respiratory distress, breathing rate appears normal, no obvious gross SOB, gasping or wheezing  CV: no obvious cyanosis  MS: moves all visible extremities without noticeable abnormality  PSYCH/NEURO: pleasant and cooperative, no obvious depression or anxiety, speech and thought processing grossly intact  ASSESSMENT AND PLAN:  Discussed the following assessment and plan:  COVID-19 - Plan: molnupiravir EUA (LAGEVRIO) 200 mg CAPS capsule 400 mg bid  Supportive care sent my chart Note for work    -we discussed possible serious and likely etiologies, options for evaluation and workup, limitations of telemedicine visit vs in person visit, treatment, treatment risks and precautions. Pt is agreeable to treatment via telemedicine at  this moment.  Work/School slipped offered: provided in patient instructions  yes  I discussed the assessment and treatment plan with the patient. The patient was provided an opportunity to ask questions and all were answered. The patient agreed with the plan and demonstrated an understanding of the instructions.    Time spent 20 min Delorise Jackson, MD

## 2022-05-29 NOTE — Patient Instructions (Addendum)
Molnupiravir Capsules What is this medication? MOLNUPIRAVIR (MOL nue PIR a vir) treats mild to moderate COVID-19. It may help people who are at high risk of developing severe illness. This medication works by limiting the spread of the virus in your body. The FDA has allowed the emergency use of this medication. This medicine may be used for other purposes; ask your health care provider or pharmacist if you have questions. COMMON BRAND NAME(S): LAGEVRIO What should I tell my care team before I take this medication? They need to know if you have any of these conditions: Any allergies Any serious illness An unusual or allergic reaction to molnupiravir, other medications, foods, dyes, or preservatives Pregnant or trying to get pregnant Breast-feeding How should I use this medication? Take this medication by mouth with water. Take it as directed on the prescription label at the same time every day. Do not cut, crush, or chew this medication. Swallow the capsules whole. You can take it with or without food. If it upsets your stomach, take it with food. Take all of it unless your care team tells you to stop it early. Keep taking it even if you think you are better. Talk to your care team about the use of this medication in children. Special care may be needed. Overdosage: If you think you have taken too much of this medicine contact a poison control center or emergency room at once. NOTE: This medicine is only for you. Do not share this medicine with others. What if I miss a dose? If you miss a dose, take it as soon as you can unless it is more than 10 hours late. If it is more than 10 hours late, skip the missed dose. Take the next dose at the normal time. Do not take extra or 2 doses at the same time to make up for the missed dose. What may interact with this medication? Interactions have not been studied. This list may not describe all possible interactions. Give your health care provider a list of  all the medicines, herbs, non-prescription drugs, or dietary supplements you use. Also tell them if you smoke, drink alcohol, or use illegal drugs. Some items may interact with your medicine. What should I watch for while using this medication? Your condition will be monitored carefully while you are receiving this medication. Visit your care team for regular checkups. Tell your care team if your symptoms do not start to get better or if they get worse. Do not become pregnant while taking this medication. You may need a pregnancy test before starting this medication. Women must use a reliable form of birth control while taking this medication and for 4 days after stopping the medication. Women should inform their care team if they wish to become pregnant or think they might be pregnant. Men should not father a child while taking this medication and for 3 months after stopping it. There is potential for serious harm to an unborn child. Talk to your care team for more information. Do not breast-feed an infant while taking this medication and for 4 days after stopping the medication. What side effects may I notice from receiving this medication? Side effects that you should report to your care team as soon as possible: Allergic reactions--skin rash, itching, hives, swelling of the face, lips, tongue, or throat Side effects that usually do not require medical attention (report these to your care team if they continue or are bothersome): Diarrhea Dizziness Nausea This list may not  describe all possible side effects. Call your doctor for medical advice about side effects. You may report side effects to FDA at 1-800-FDA-1088. Where should I keep my medication? Keep out of the reach of children and pets. Store at room temperature between 20 and 25 degrees C (68 and 77 degrees F). Get rid of any unused medication after the expiration date. To get rid of medications that are no longer needed or have  expired: Take the medication to a medication take-back program. Check with your pharmacy or law enforcement to find a location. If you cannot return the medication, check the label or package insert to see if the medication should be thrown out in the garbage or flushed down the toilet. If you are not sure, ask your care team. If it is safe to put it in the trash, take the medication out of the container. Mix the medication with cat litter, dirt, coffee grounds, or other unwanted substance. Seal the mixture in a bag or container. Put it in the trash. NOTE: This sheet is a summary. It may not cover all possible information. If you have questions about this medicine, talk to your doctor, pharmacist, or health care provider.  2023 Elsevier/Gold Standard (2020-10-21 00:00:00)  If needing prescription strength medication we will need to make an appointment with a provider.  These are over the counter medication options:  Mucinex dm green label for cough or robitussin DM  Multivitamin or below vitamins  Vitamin C 1000 mg daily.  Vitamin D3 4000 Iu (units) daily.  Zinc 100 mg daily.  Quercetin 250-500 mg 2 times per day   Elderberry  Oil of oregano  cepacol or chloroseptic spray Warm salt water gargles +hydrogen peroxide Sugar free cough drops  Warm tea with honey and lemon  Hydration  Try to eat though you dont feel like it   Tylenol or Advil  Nasal saline and Flonase 2 sprays nasal congestion  If sneezing/runny nose over the counter allergy pill claritin,allegra, zyrtec, xyzal Quarantine x 10-14 days 14 days preferred   Monitor pulse oximeter, buy from [place] if oxygen is less than 90 please go to the hospital.        Are you feeling really sick? Shortness of breath, cough, chest pain?, dizziness? Confusion   If so let me know  If worsening, go to hospital or [facility] clinic Urgent care for further treatment.

## 2022-06-02 ENCOUNTER — Inpatient Hospital Stay: Payer: BC Managed Care – PPO | Admitting: Adult Health

## 2022-06-16 DIAGNOSIS — G479 Sleep disorder, unspecified: Secondary | ICD-10-CM | POA: Diagnosis not present

## 2022-06-16 DIAGNOSIS — F419 Anxiety disorder, unspecified: Secondary | ICD-10-CM | POA: Diagnosis not present

## 2022-06-16 DIAGNOSIS — Z6379 Other stressful life events affecting family and household: Secondary | ICD-10-CM | POA: Diagnosis not present

## 2022-06-23 DIAGNOSIS — Z170 Estrogen receptor positive status [ER+]: Secondary | ICD-10-CM | POA: Diagnosis not present

## 2022-06-23 DIAGNOSIS — C50411 Malignant neoplasm of upper-outer quadrant of right female breast: Secondary | ICD-10-CM | POA: Diagnosis not present

## 2022-07-07 DIAGNOSIS — F419 Anxiety disorder, unspecified: Secondary | ICD-10-CM | POA: Diagnosis not present

## 2022-07-07 DIAGNOSIS — G479 Sleep disorder, unspecified: Secondary | ICD-10-CM | POA: Diagnosis not present

## 2022-07-07 DIAGNOSIS — F900 Attention-deficit hyperactivity disorder, predominantly inattentive type: Secondary | ICD-10-CM | POA: Diagnosis not present

## 2022-07-07 DIAGNOSIS — Z6379 Other stressful life events affecting family and household: Secondary | ICD-10-CM | POA: Diagnosis not present

## 2022-07-20 ENCOUNTER — Ambulatory Visit: Payer: BC Managed Care – PPO

## 2022-07-21 ENCOUNTER — Encounter: Payer: Self-pay | Admitting: Adult Health

## 2022-07-21 ENCOUNTER — Inpatient Hospital Stay: Payer: BC Managed Care – PPO

## 2022-07-21 ENCOUNTER — Other Ambulatory Visit: Payer: Self-pay

## 2022-07-21 ENCOUNTER — Inpatient Hospital Stay: Payer: BC Managed Care – PPO | Attending: Adult Health | Admitting: Adult Health

## 2022-07-21 ENCOUNTER — Ambulatory Visit: Payer: BC Managed Care – PPO | Attending: General Surgery | Admitting: Physical Therapy

## 2022-07-21 VITALS — BP 125/81 | HR 117 | Temp 98.1°F | Resp 18 | Ht 64.0 in | Wt 191.0 lb

## 2022-07-21 DIAGNOSIS — C50411 Malignant neoplasm of upper-outer quadrant of right female breast: Secondary | ICD-10-CM

## 2022-07-21 DIAGNOSIS — Z171 Estrogen receptor negative status [ER-]: Secondary | ICD-10-CM

## 2022-07-21 DIAGNOSIS — Z853 Personal history of malignant neoplasm of breast: Secondary | ICD-10-CM | POA: Diagnosis not present

## 2022-07-21 DIAGNOSIS — Z170 Estrogen receptor positive status [ER+]: Secondary | ICD-10-CM | POA: Insufficient documentation

## 2022-07-21 DIAGNOSIS — Z9011 Acquired absence of right breast and nipple: Secondary | ICD-10-CM | POA: Diagnosis not present

## 2022-07-21 DIAGNOSIS — Z923 Personal history of irradiation: Secondary | ICD-10-CM | POA: Insufficient documentation

## 2022-07-21 DIAGNOSIS — Z9221 Personal history of antineoplastic chemotherapy: Secondary | ICD-10-CM | POA: Insufficient documentation

## 2022-07-21 LAB — CBC WITH DIFFERENTIAL (CANCER CENTER ONLY)
Abs Immature Granulocytes: 0.03 10*3/uL (ref 0.00–0.07)
Basophils Absolute: 0.1 10*3/uL (ref 0.0–0.1)
Basophils Relative: 1 %
Eosinophils Absolute: 0.3 10*3/uL (ref 0.0–0.5)
Eosinophils Relative: 4 %
HCT: 40.2 % (ref 36.0–46.0)
Hemoglobin: 14 g/dL (ref 12.0–15.0)
Immature Granulocytes: 1 %
Lymphocytes Relative: 25 %
Lymphs Abs: 1.6 10*3/uL (ref 0.7–4.0)
MCH: 32.3 pg (ref 26.0–34.0)
MCHC: 34.8 g/dL (ref 30.0–36.0)
MCV: 92.8 fL (ref 80.0–100.0)
Monocytes Absolute: 0.7 10*3/uL (ref 0.1–1.0)
Monocytes Relative: 10 %
Neutro Abs: 3.9 10*3/uL (ref 1.7–7.7)
Neutrophils Relative %: 59 %
Platelet Count: 219 10*3/uL (ref 150–400)
RBC: 4.33 MIL/uL (ref 3.87–5.11)
RDW: 13.4 % (ref 11.5–15.5)
WBC Count: 6.5 10*3/uL (ref 4.0–10.5)
nRBC: 0 % (ref 0.0–0.2)

## 2022-07-21 NOTE — Progress Notes (Signed)
SURVIVORSHIP VISIT:   BRIEF ONCOLOGIC HISTORY:  Oncology History  Malignant neoplasm of upper-outer quadrant of right breast in female, estrogen receptor negative ([place])  07/08/2021 Initial Diagnosis   Palpable right breast lump. Diagnostic mammogram: showed highly suspicious right breast mass and an indeterminate single right axillary lymph node with up to 5 mm diffuse cortical thickening. Biopsy: grade 2-3 invasive ductal carcinoma, DCIS, and right axillary lymph node negative for metastatic carcinoma; ER+(80%)/PR-/Her2-.   07/16/2021 Cancer Staging   Staging form: Breast, AJCC 8th Edition - Clinical stage from 07/16/2021: Stage IIB (cT2, cN0, cM0, G3, ER+, PR-, HER2-) - Signed by Nicholas Lose, MD on 07/16/2021 Stage prefix: Initial diagnosis Histologic grading system: 3 grade system   08/18/2021 Surgery   Right mastectomy: Grade 3 IDC 2.8 cm with high-grade DCIS, margins negative, 0/3 lymph nodes negative, ER 0%, PR 0%, HER2 negative, Ki-67 60% (triple negative)   08/18/2021 Cancer Staging   Staging form: Breast, AJCC 8th Edition - Pathologic stage from 08/18/2021: Stage IIA (pT2, pN0, cM0, G3, ER-, PR-, HER2-) - Signed by Gardenia Phlegm, NP on 07/21/2022 Stage prefix: Initial diagnosis Histologic grading system: 3 grade system   09/22/2021 - 02/04/2022 Chemotherapy   Patient is on Treatment Plan : BREAST Dose Dense AC q14d / CARBOplatin D1 + PACLitaxel D1,8,15 q21d     10/06/2021 Genetic Testing   Negative hereditary cancer genetic testing: no pathogenic variants detected in [facility] CancerNext-Expanded +RNAinsight Panel.  The report date is 10/06/2021.   The CancerNext-Expanded gene panel offered by [facility] and includes sequencing, rearrangement, and RNA analysis for the following 77 genes: AIP, ALK, APC, ATM, AXIN2, BAP1, BARD1, BLM, BMPR1A, BRCA1, BRCA2, BRIP1, CDC73, CDH1, CDK4, CDKN1B, CDKN2A, CHEK2, CTNNA1, DICER1, FANCC, FH, FLCN, GALNT12, KIF1B, LZTR1, MAX, MEN1,  MET, MLH1, MSH2, MSH3, MSH6, MUTYH, NBN, NF1, NF2, NTHL1, PALB2, PHOX2B, PMS2, POT1, PRKAR1A, PTCH1, PTEN, RAD51C, RAD51D, RB1, RECQL, RET, SDHA, SDHAF2, SDHB, SDHC, SDHD, SMAD4, SMARCA4, SMARCB1, SMARCE1, STK11, SUFU, TMEM127, TP53, TSC1, TSC2, VHL and XRCC2 (sequencing and deletion/duplication); EGFR, EGLN1, HOXB13, KIT, MITF, PDGFRA, POLD1, and POLE (sequencing only); EPCAM and GREM1 (deletion/duplication only).      INTERVAL HISTORY:  Ms. Shadowens to review her survivorship care plan detailing her treatment course for breast cancer, as well as monitoring long-term side effects of that treatment, education regarding health maintenance, screening, and overall wellness and health promotion.     Overall, Ms. Aispuro reports feeling quite well.  She has no current issues other than a thick feeling in her axilla.    REVIEW OF SYSTEMS:  Review of Systems  Constitutional:  Negative for appetite change, chills, fatigue, fever and unexpected weight change.  HENT:   Negative for hearing loss, lump/mass and trouble swallowing.   Eyes:  Negative for eye problems and icterus.  Respiratory:  Negative for chest tightness, cough and shortness of breath.   Cardiovascular:  Negative for chest pain, leg swelling and palpitations.  Gastrointestinal:  Negative for abdominal distention, abdominal pain, constipation, diarrhea, nausea and vomiting.  Endocrine: Negative for hot flashes.  Genitourinary:  Negative for difficulty urinating.   Musculoskeletal:  Negative for arthralgias.  Skin:  Negative for itching and rash.  Neurological:  Negative for dizziness, extremity weakness, headaches and numbness.  Hematological:  Negative for adenopathy. Does not bruise/bleed easily.  Psychiatric/Behavioral:  Negative for depression. The patient is not nervous/anxious.   Breast: Denies any new nodularity, masses, tenderness, nipple changes, or nipple discharge.      ONCOLOGY TREATMENT TEAM:  1. Surgeon:  Dr. Marlou Starks at  [facility] 2. Medical Oncologist: Dr. Lindi Adie  3. Radiation Oncologist: Dr. Isidore Moos    PAST MEDICAL/SURGICAL HISTORY:  Past Medical History:  Diagnosis Date   Abnormal uterine bleeding    ADHD (attention deficit hyperactivity disorder)    Anemia    Anxiety    Bacterial vaginosis    Cancer ([place]) 06/2021   breast cancer   Complication of anesthesia    "woke up with high heart rate" thinks it was phenergan   Depression    Endometriosis    Family history of breast cancer 07/17/2021   Family history of melanoma 07/17/2021   Family history of prostate cancer 07/17/2021   Family history of uterine cancer 07/17/2021   Hyperlipidemia    Migraine    PCOS (polycystic ovarian syndrome)    PONV (postoperative nausea and vomiting)    Stroke [facility])    "old stroke" per MRI   Past Surgical History:  Procedure Laterality Date   IR IMAGING GUIDED PORT INSERTION  09/09/2021   LAPAROSCOPY  1993   MASS EXCISION Bilateral 03/09/2022   Procedure: BILATERAL EXCISION OF EXCESS TISSUE CHEST WALL;  Surgeon: Jovita Kussmaul, MD;  Location: [place];  Service: General;  Laterality: Bilateral;   MASTECTOMY W/ SENTINEL NODE BIOPSY Right 08/18/2021   Procedure: RIGHT MASTECTOMY WITH RIGHT SENTINEL LYMPH NODE BIOPSY;  Surgeon: Jovita Kussmaul, MD;  Location: [place];  Service: General;  Laterality: Right;   PORT-A-CATH REMOVAL Right 03/09/2022   Procedure: REMOVAL PORT-A-CATH;  Surgeon: Jovita Kussmaul, MD;  Location: [place];  Service: General;  Laterality: Right;   SALPINGECTOMY Bilateral 2015   TOTAL MASTECTOMY Left 08/18/2021   Procedure: LEFT TOTAL MASTECTOMY;  Surgeon: [name], MD;  Location: [place];  Service: General;  Laterality: Left;   VAGINAL HYSTERECTOMY  2017     ALLERGIES:  Allergies  Allergen Reactions   Benadryl [Diphenhydramine] Other (See Comments)    Suspected elevated heart rate   Latex Itching   Phenergan [Promethazine Hcl] Other  (See Comments)    Suspected elevated heart rate after surgery     CURRENT MEDICATIONS:  Outpatient Encounter Medications as of 07/21/2022  Medication Sig Note   methylphenidate 18 MG PO CR tablet Take 18 mg by mouth every morning.    acetaminophen (TYLENOL) 500 MG tablet Take 1,000 mg by mouth every 8 (eight) hours as needed for moderate pain.    ALPRAZolam (XANAX XR) 2 MG 24 hr tablet TAKE 1 TABLET BY MOUTH EVERYDAY AT BEDTIME    bimatoprost (LATISSE) 0.03 % ophthalmic solution Place 1 application. into both eyes at bedtime. Apply evenly along the skin of the upper eyelid at base of eyelashes once daily at bedtime; repeat procedure for second eye (use a clean applicator).    citalopram (CELEXA) 40 MG tablet Take 1 tablet (40 mg total) by mouth daily.    doxycycline (VIBRA-TABS) 100 MG tablet Take 1 tablet (100 mg total) by mouth 2 (two) times daily.    ergocalciferol (DRISDOL) 1.25 MG (50000 UT) capsule Take 1 capsule (50,000 Units total) by mouth once a week. 08/11/2021: No specific day    ibuprofen (ADVIL) 200 MG tablet Take 800 mg by mouth every 8 (eight) hours as needed for moderate pain.    loratadine (CLARITIN) 10 MG tablet Take 10 mg by mouth daily.    Multiple Vitamins-Minerals (MULTIVITAMIN WITH MINERALS) tablet Take 1 tablet by mouth daily.  ondansetron (ZOFRAN) 8 MG tablet Take 8 mg by mouth 2 (two) times daily as needed.    prochlorperazine (COMPAZINE) 10 MG tablet Take 10 mg by mouth every 6 (six) hours as needed.    vitamin B-12 (CYANOCOBALAMIN) 500 MCG tablet Take 500 mcg by mouth daily.    No facility-administered encounter medications on file as of 07/21/2022.     ONCOLOGIC FAMILY HISTORY:  Family History  Problem Relation Age of Onset   Uterine cancer Mother 22   Diabetes Father    Hyperlipidemia Father    Hypertension Father    Melanoma Sister 87       T1a; on back   ADD / ADHD Sister    Diabetes Sister    Hypertension Sister    Heart disease Maternal  Grandmother    Bipolar disorder Maternal Grandfather    Heart disease Paternal Grandmother    Prostate cancer Paternal Grandfather        dx after 80   Diabetes Paternal Grandfather    Breast cancer Other 36       MGM's mother   Colon cancer Neg Hx    Colon polyps Neg Hx    Esophageal cancer Neg Hx    Stomach cancer Neg Hx    Rectal cancer Neg Hx      SOCIAL HISTORY:  Social History   Socioeconomic History   Marital status: Divorced    Spouse name: Not on file   Number of children: 2   Years of education: Not on file   Highest education level: Not on file  Occupational History   Occupation: RN  Tobacco Use   Smoking status: Former    Types: Cigarettes    Quit date: 2018    Years since quitting: 5.7   Smokeless tobacco: Never  Vaping Use   Vaping Use: Former  Substance and Sexual Activity   Alcohol use: Yes    Comment: occasionally   Drug use: No   Sexual activity: Yes    Birth control/protection: Surgical  Other Topics Concern   Not on file  Social History Narrative   2 adopted children: 46 yo son, 41 yo daughter,    Social Determinants of Radio broadcast assistant Strain: Not on file  Food Insecurity: Not on file  Transportation Needs: Not on file  Physical Activity: Not on file  Stress: Not on file  Social Connections: Not on file  Intimate Partner Violence: Not on file     OBSERVATIONS/OBJECTIVE:  BP 125/81 (BP Location: Left Arm, Patient Position: Sitting)   Pulse (!) 117   Temp 98.1 F (36.7 C) (Temporal)   Resp 18   Ht $R'5\' 4"'Uv$  (1.626 m)   Wt 191 lb (86.6 kg)   SpO2 98%   BMI 32.79 kg/m  GENERAL: Patient is a well appearing female in no acute distress HEENT:  Sclerae anicteric.  Oropharynx clear and moist. No ulcerations or evidence of oropharyngeal candidiasis. Neck is supple.  NODES:  No cervical, supraclavicular, or axillary lymphadenopathy palpated.  BREAST EXAM: Status post bilateral mastectomies no sign of local recurrence. LUNGS:   Clear to auscultation bilaterally.  No wheezes or rhonchi. HEART:  Regular rate and rhythm. No murmur appreciated. ABDOMEN:  Soft, nontender.  Positive, normoactive bowel sounds. No organomegaly palpated. MSK:  No focal spinal tenderness to palpation. Full range of motion bilaterally in the upper extremities. EXTREMITIES:  No peripheral edema.   SKIN:  Clear with no obvious rashes or skin changes. No nail  dyscrasia. NEURO:  Nonfocal. Well oriented.  Appropriate affect.  LABORATORY DATA:  None for this visit.  DIAGNOSTIC IMAGING:  None for this visit.      ASSESSMENT AND PLAN:  Ms.. Halberg is a pleasant 46 y.o. female with Stage IIA right breast invasive ductal carcinoma, ER-/PR-/HER2-, diagnosed in 06/2021, treated with mastectomy, adjuvant chemotherapy.  She presents to the Survivorship Clinic for our initial meeting and routine follow-up post-completion of treatment for breast cancer.    1. Stage IIA right breast cancer:  Ms. Mccallum is continuing to recover from definitive treatment for breast cancer. She will follow-up with her medical oncologist, Dr. Lindi Adie in 6 months with history and physical exam per surveillance protocol.  I discussed [employer] testing with her and she is very interested my nurse is placing these orders today.. Today, a comprehensive survivorship care plan and treatment summary was reviewed with the patient today detailing her breast cancer diagnosis, treatment course, potential late/long-term effects of treatment, appropriate follow-up care with recommendations for the future, and patient education resources.  A copy of this summary, along with a letter will be sent to the patient's primary care provider via mail/fax/In Basket message after today's visit.    2. Bone health:  She was given education on specific activities to promote bone health.  3. Cancer screening:  Due to Ms. Lightner's history and her age, she should receive screening for skin cancers, colon  cancer, and gynecologic cancers.  The information and recommendations are listed on the patient's comprehensive care plan/treatment summary and were reviewed in detail with the patient.    4. Health maintenance and wellness promotion: Ms. Brame was encouraged to consume 5-7 servings of fruits and vegetables per day. We reviewed the "Nutrition Rainbow" handout.  She was also encouraged to engage in moderate to vigorous exercise for 30 minutes per day most days of the week. We discussed the [employer] fitness program, which is designed for cancer survivors to help them become more physically fit after cancer treatments.  She was instructed to limit her alcohol consumption and continue to abstain from tobacco use.     5. Support services/counseling: It is not uncommon for this period of the patient's cancer care trajectory to be one of many emotions and stressors.  She was given information regarding our available services and encouraged to contact me with any questions or for help enrolling in any of our support group/programs.    Follow up instructions:    -Return to cancer center 6 months for f/u with Dr. Lindi Adie  -Follow up with surgery 1 year -She is welcome to return back to the Survivorship Clinic at any time; no additional follow-up needed at this time.  -Consider referral back to survivorship as a long-term survivor for continued surveillance  The patient was provided an opportunity to ask questions and all were answered. The patient agreed with the plan and demonstrated an understanding of the instructions.   Total encounter time:30 minutes*in face-to-face visit time, chart review, lab review, care coordination, order entry, and documentation of the encounter time.    Wilber Bihari, NP 07/21/22 4:12 PM Medical Oncology and Hematology [facility] [place], [place] [address] Tel. [phone]    Fax. [phone]  *Total Encounter Time as defined by  the Centers for Medicare and Medicaid Services includes, in addition to the face-to-face time of a patient visit (documented in the note above) non-face-to-face time: obtaining and reviewing outside history, ordering and reviewing medications, tests  or procedures, care coordination (communications with other health care professionals or caregivers) and documentation in the medical record.

## 2022-07-21 NOTE — Therapy (Signed)
OUTPATIENT PHYSICAL THERAPY SOZO SCREENING NOTE   Patient Name: Katelyn Hobbs MRN: [id number] DOB:03-14-1976, 46 y.o., female Today's Date: 07/21/2022  PCP: Crecencio Mc, MD REFERRING PROVIDER: Jovita Kussmaul, MD    Past Medical History:  Diagnosis Date   Abnormal uterine bleeding    ADHD (attention deficit hyperactivity disorder)    Anemia    Anxiety    Bacterial vaginosis    Cancer ([place]) 06/2021   breast cancer   Complication of anesthesia    "woke up with high heart rate" thinks it was phenergan   Depression    Endometriosis    Family history of breast cancer 07/17/2021   Family history of melanoma 07/17/2021   Family history of prostate cancer 07/17/2021   Family history of uterine cancer 07/17/2021   Hyperlipidemia    Migraine    PCOS (polycystic ovarian syndrome)    PONV (postoperative nausea and vomiting)    Stroke [facility])    "old stroke" per MRI   Past Surgical History:  Procedure Laterality Date   IR IMAGING GUIDED PORT INSERTION  09/09/2021   LAPAROSCOPY  1993   MASS EXCISION Bilateral 03/09/2022   Procedure: BILATERAL EXCISION OF EXCESS TISSUE CHEST WALL;  Surgeon: Jovita Kussmaul, MD;  Location: [place];  Service: General;  Laterality: Bilateral;   MASTECTOMY W/ SENTINEL NODE BIOPSY Right 08/18/2021   Procedure: RIGHT MASTECTOMY WITH RIGHT SENTINEL LYMPH NODE BIOPSY;  Surgeon: Jovita Kussmaul, MD;  Location: [place];  Service: General;  Laterality: Right;   PORT-A-CATH REMOVAL Right 03/09/2022   Procedure: REMOVAL PORT-A-CATH;  Surgeon: Jovita Kussmaul, MD;  Location: [place];  Service: General;  Laterality: Right;   SALPINGECTOMY Bilateral 2015   TOTAL MASTECTOMY Left 08/18/2021   Procedure: LEFT TOTAL MASTECTOMY;  Surgeon: Jovita Kussmaul, MD;  Location: [place];  Service: General;  Laterality: Left;   VAGINAL HYSTERECTOMY  2017   Patient Active Problem List   Diagnosis Date Noted   B12 deficiency 04/17/2022   History  of CVA (cerebrovascular accident) without residual deficits 02/16/2022   Port-A-Cath in place 10/21/2021   Cancer of right female breast ([place]) 08/18/2021   Family history of uterine cancer 07/17/2021   Family history of breast cancer 07/17/2021   Family history of prostate cancer 07/17/2021   Family history of melanoma 07/17/2021   Genetic testing 07/17/2021   Malignant neoplasm of upper-outer quadrant of right breast in female, estrogen receptor negative ([place]) 07/14/2021   Breast calcifications on mammogram 03/16/2021   Encounter for preventive health examination 01/08/2021   Primary insomnia 09/24/2020   PCOS (polycystic ovarian syndrome) 04/16/2020   Paroxysmal SVT (supraventricular tachycardia) ([place]) 04/16/2020   Screen for STD (sexually transmitted disease) 04/16/2020   S/P total hysterectomy 04/16/2020   Vitamin D deficiency 09/21/2015   Obesity (BMI 30.0-34.9) 09/17/2015    REFERRING DIAG: right breast cancer at risk for lymphedema  THERAPY DIAG:  No diagnosis found.  PERTINENT HISTORY: Rt mastectomy 08/18/21 due to Grade 3 triple negative breast cancer. 0/3LN negative. Chemo completed  PRECAUTIONS: right UE Lymphedema risk,   SUBJECTIVE: Pt is here for her 3 month SOZO.   PAIN:  Are you having pain? No  SOZO SCREENING: Patient was assessed today using the SOZO machine to determine the lymphedema index score. This was compared to her baseline score. It was determined that she is within the recommended range when compared to her baseline and no further action is needed at this time.  She will continue SOZO screenings. These are done every 3 months for 2 years post operatively followed by every 6 months for 2 years, and then annually.     [facility], PT 07/21/2022, 12:53 PM

## 2022-07-21 NOTE — Progress Notes (Signed)
Orders entered for signatera testing per MD. Requisition and all supporting documents faxed to [phone] with fax confirmation.   

## 2022-07-28 DIAGNOSIS — Z6379 Other stressful life events affecting family and household: Secondary | ICD-10-CM | POA: Diagnosis not present

## 2022-07-28 DIAGNOSIS — F900 Attention-deficit hyperactivity disorder, predominantly inattentive type: Secondary | ICD-10-CM | POA: Diagnosis not present

## 2022-07-28 DIAGNOSIS — G479 Sleep disorder, unspecified: Secondary | ICD-10-CM | POA: Diagnosis not present

## 2022-07-28 DIAGNOSIS — F419 Anxiety disorder, unspecified: Secondary | ICD-10-CM | POA: Diagnosis not present

## 2022-08-13 ENCOUNTER — Encounter: Payer: Self-pay | Admitting: Hematology and Oncology

## 2022-08-18 DIAGNOSIS — F419 Anxiety disorder, unspecified: Secondary | ICD-10-CM | POA: Diagnosis not present

## 2022-08-18 DIAGNOSIS — F1320 Sedative, hypnotic or anxiolytic dependence, uncomplicated: Secondary | ICD-10-CM | POA: Diagnosis not present

## 2022-08-18 DIAGNOSIS — G4700 Insomnia, unspecified: Secondary | ICD-10-CM | POA: Diagnosis not present

## 2022-08-18 DIAGNOSIS — F900 Attention-deficit hyperactivity disorder, predominantly inattentive type: Secondary | ICD-10-CM | POA: Diagnosis not present

## 2022-08-19 ENCOUNTER — Encounter: Payer: Self-pay | Admitting: *Deleted

## 2022-08-26 ENCOUNTER — Encounter: Payer: Self-pay | Admitting: *Deleted

## 2022-08-26 NOTE — Progress Notes (Signed)
Receive a message from [employer] representative stating patient did not respond to their call to schedule mobile phlebotomy draw.  Representative states patient will need to reach out to [employer] directly at [phone] option #1 [phone] to schedule if patient wishes to proceed.   

## 2022-08-31 ENCOUNTER — Ambulatory Visit: Payer: BC Managed Care – PPO | Admitting: Internal Medicine

## 2022-09-01 ENCOUNTER — Encounter: Payer: Self-pay | Admitting: Internal Medicine

## 2022-09-01 ENCOUNTER — Ambulatory Visit: Payer: BC Managed Care – PPO | Admitting: Internal Medicine

## 2022-09-01 VITALS — BP 110/80 | HR 92 | Temp 98.0°F | Ht 64.0 in | Wt 192.4 lb

## 2022-09-01 DIAGNOSIS — E282 Polycystic ovarian syndrome: Secondary | ICD-10-CM | POA: Diagnosis not present

## 2022-09-01 DIAGNOSIS — E559 Vitamin D deficiency, unspecified: Secondary | ICD-10-CM | POA: Diagnosis not present

## 2022-09-01 DIAGNOSIS — F5101 Primary insomnia: Secondary | ICD-10-CM | POA: Diagnosis not present

## 2022-09-01 DIAGNOSIS — Z8673 Personal history of transient ischemic attack (TIA), and cerebral infarction without residual deficits: Secondary | ICD-10-CM | POA: Diagnosis not present

## 2022-09-01 DIAGNOSIS — C50911 Malignant neoplasm of unspecified site of right female breast: Secondary | ICD-10-CM

## 2022-09-01 DIAGNOSIS — Z171 Estrogen receptor negative status [ER-]: Secondary | ICD-10-CM

## 2022-09-01 DIAGNOSIS — I471 Supraventricular tachycardia, unspecified: Secondary | ICD-10-CM

## 2022-09-01 MED ORDER — SEMAGLUTIDE (1 MG/DOSE) 4 MG/3ML ~~LOC~~ SOPN: 1.0000 mg | PEN_INJECTOR | SUBCUTANEOUS | 1 refills | Status: DC

## 2022-09-01 MED ORDER — PROPRANOLOL HCL 10 MG PO TABS: 10.0000 mg | ORAL_TABLET | Freq: Three times a day (TID) | ORAL | 0 refills | Status: DC

## 2022-09-01 MED ORDER — CYANOCOBALAMIN 1000 MCG/ML IJ SOLN: 1000.0000 ug | Freq: Once | INTRAMUSCULAR | 3 refills | Status: AC

## 2022-09-01 NOTE — Assessment & Plan Note (Signed)
Recommend taking 1000 to 2000 Ius daily

## 2022-09-01 NOTE — Assessment & Plan Note (Signed)
With insulin resistance and obesity  prior treatment failure with metformin XR due to persistent diarrhea. PA for ozempic in progress . Counselling on diet given.  Exercising 5 days per week with a 3- mintue brisk walks

## 2022-09-01 NOTE — Patient Instructions (Addendum)
I will send b12 injectible to pharmacy for  resuming your monthly injections   You can use 10 mg (or start with 1/2 tablet) propranolol for rapid heart rate.  Will not make you sleepy,  it only works to slow down the heart rate   Vitamin  D3 1000  to  2000 IUs daily   If ozempic is approved,  bring the pen to RN visit for instruction  on how to manipulate the dose to deliver the 0.25 mg lowest dose   Healthy choice low carb power bowls (for self control)   Recommending Low Carb high Protein premixed Shakes for breakfast   Premier Protein  Atkins Advantage Muscle Milk EAS AdvantEdge   All of these are available at [employer], [place],  [place]  And taste good

## 2022-09-01 NOTE — Assessment & Plan Note (Signed)
Her psychiatrist is planning a reduction in alprzolam XR from 2 mg to 1 mg with use of gabapentin

## 2022-09-01 NOTE — Assessment & Plan Note (Signed)
Trial of propranolol for prn use.

## 2022-09-01 NOTE — Assessment & Plan Note (Signed)
S/p bilateral mastectomy by Dr Marlou Starks. . She has completed cheomotherapy and has no evidence of recurrence or metastasis

## 2022-09-01 NOTE — Progress Notes (Signed)
Subjective:  Patient ID: Katelyn Hobbs, female    DOB: [birth year]  Age: 46 y.o. MRN: [id number]  CC: The primary encounter diagnosis was Vitamin D deficiency. Diagnoses of History of CVA (cerebrovascular accident) without residual deficits, PCOS (polycystic ovarian syndrome), Primary insomnia, Paroxysmal SVT (supraventricular tachycardia), and Malignant neoplasm of right breast in female, estrogen receptor negative, unspecified site of breast [facility]) were also pertinent to this visit.   HPI Katelyn Hobbs presents for  Chief Complaint  Patient presents with   Follow-up   1) GAD with insomnia:  managed with 2 mg  Xanax XR by psychiatry [place] in [facility] ,also treatedi n her ADD with methylphenidate XR ([place]) .  Refill history confirmed via [place] Controlled Substance databas, accessed by me today..  has been prescribed gabapntin and reducing xanax to 1 mg .   2) Breast CA:  diagnosed sept 2022.  S/p bilateral mastectomy (left prophylactic) s/p chemo/XRT ,  negative genetic testing . Porta catj removed May 2023   3) incidental finding of cerebellar stroke by MRI .  Sleep study was ordered at last visit, however  only a home study was covered by insurance and after a 2 day study at home  the machine did not record,  so she did not repeat.  Does not want to repeat. Taking asa   4) Obesity/PCOS/insulin resistance:  she was losing weight  with intermittent fasting but oncologist did not want her to fast.   Walking at least 4 days /week 30 minutes.  Previous trials of metformin during preconception planning caused persistent diarrhea even the  XR .  NO C/I to GLP 1 agonist   5) SVT intermittent.  Occurring  during  a hjke  this weekend  , pulse was  220.  Managed with deep breathing  ,  no prior trial of inderal.    Outpatient Medications Prior to Visit  Medication Sig Dispense Refill   acetaminophen (TYLENOL) 500 MG tablet Take 1,000 mg by mouth every 8 (eight) hours as  needed for moderate pain.     ALPRAZolam (XANAX XR) 2 MG 24 hr tablet TAKE 1 TABLET BY MOUTH EVERYDAY AT BEDTIME 30 tablet 2   bimatoprost (LATISSE) 0.03 % ophthalmic solution Place 1 application. into both eyes at bedtime. Apply evenly along the skin of the upper eyelid at base of eyelashes once daily at bedtime; repeat procedure for second eye (use a clean applicator). 3 mL 12   Cholecalciferol (VITAMIN D3) 125 MCG (5000 UT) CAPS Take 1 capsule by mouth daily.     citalopram (CELEXA) 40 MG tablet Take 1 tablet (40 mg total) by mouth daily. 90 tablet 1   ibuprofen (ADVIL) 200 MG tablet Take 800 mg by mouth every 8 (eight) hours as needed for moderate pain.     loratadine (CLARITIN) 10 MG tablet Take 10 mg by mouth daily.     methylphenidate 18 MG PO CR tablet Take 18 mg by mouth every morning.     Multiple Vitamins-Minerals (MULTIVITAMIN WITH MINERALS) tablet Take 1 tablet by mouth daily.     vitamin B-12 (CYANOCOBALAMIN) 500 MCG tablet Take 500 mcg by mouth daily.     gabapentin (NEURONTIN) 100 MG capsule Take 100 mg by mouth 3 (three) times daily. (Patient not taking: Reported on 09/01/2022)     doxycycline (VIBRA-TABS) 100 MG tablet Take 1 tablet (100 mg total) by mouth 2 (two) times daily. (Patient not taking: Reported on 09/01/2022) 20 tablet 0  ergocalciferol (DRISDOL) 1.25 MG (50000 UT) capsule Take 1 capsule (50,000 Units total) by mouth once a week. (Patient not taking: Reported on 09/01/2022) 12 capsule 0   ondansetron (ZOFRAN) 8 MG tablet Take 8 mg by mouth 2 (two) times daily as needed. (Patient not taking: Reported on 09/01/2022)     prochlorperazine (COMPAZINE) 10 MG tablet Take 10 mg by mouth every 6 (six) hours as needed. (Patient not taking: Reported on 09/01/2022)     No facility-administered medications prior to visit.    Review of Systems;  Patient denies headache, fevers, malaise, unintentional weight loss, skin rash, eye pain, sinus congestion and sinus pain, sore throat,  dysphagia,  hemoptysis , cough, dyspnea, wheezing, chest pain, palpitations, orthopnea, edema, abdominal pain, nausea, melena, diarrhea, constipation, flank pain, dysuria, hematuria, urinary  Frequency, nocturia, numbness, tingling, seizures,  Focal weakness, Loss of consciousness,  Tremor, insomnia, depression, anxiety, and suicidal ideation.      Objective:  BP 110/80 (BP Location: Left Arm, Patient Position: Sitting, Cuff Size: Large)   Pulse 92   Temp 98 F (36.7 C) (Oral)   Ht '5\' 4"'$  (1.626 m)   Wt 192 lb 6.4 oz (87.3 kg)   SpO2 96%   BMI 33.03 kg/m   BP Readings from Last 3 Encounters:  09/01/22 110/80  07/21/22 125/81  04/17/22 102/66    Wt Readings from Last 3 Encounters:  09/01/22 192 lb 6.4 oz (87.3 kg)  07/21/22 191 lb (86.6 kg)  05/29/22 194 lb (88 kg)    General appearance: alert, cooperative and appears stated age Ears: normal TM's and external ear canals both ears Throat: lips, mucosa, and tongue normal; teeth and gums normal Neck: no adenopathy, no carotid bruit, supple, symmetrical, trachea midline and thyroid not enlarged, symmetric, no tenderness/mass/nodules Back: symmetric, no curvature. ROM normal. No CVA tenderness. Lungs: clear to auscultation bilaterally Chest wall:  bilateral mastectomy scars.  Chest wall without masses  Heart: regular rate and rhythm, S1, S2 normal, no murmur, click, rub or gallop Abdomen: soft, non-tender; bowel sounds normal; no masses,  no organomegaly Pulses: 2+ and symmetric Skin: Skin color, texture, turgor normal. No rashes or lesions Lymph nodes: Cervical, supraclavicular, and axillary nodes normal. Neuro:  awake and interactive with normal mood and affect. Higher cortical functions are normal. Speech is clear without word-finding difficulty or dysarthria. Extraocular movements are intact. Visual fields of both eyes are grossly intact. Sensation to light touch is grossly intact bilaterally of upper and lower extremities. Motor  examination shows 4+/5 symmetric hand grip and upper extremity and 5/5 lower extremity strength. There is no pronation or drift. Gait is non-ataxic   Lab Results  Component Value Date   HGBA1C 5.0 04/17/2022   HGBA1C 5.1 05/17/2018   HGBA1C 5.3 02/18/2016    Lab Results  Component Value Date   CREATININE 0.66 04/17/2022   CREATININE 0.70 02/04/2022   CREATININE 0.57 01/27/2022    Lab Results  Component Value Date   WBC 6.5 07/21/2022   HGB 14.0 07/21/2022   HCT 40.2 07/21/2022   PLT 219 07/21/2022   GLUCOSE 100 (H) 04/17/2022   CHOL 234 (H) 04/17/2022   TRIG 186.0 (H) 04/17/2022   HDL 46.50 04/17/2022   LDLCALC 151 (H) 04/17/2022   ALT 30 04/17/2022   AST 21 04/17/2022   NA 140 04/17/2022   K 4.0 04/17/2022   CL 104 04/17/2022   CREATININE 0.66 04/17/2022   BUN 11 04/17/2022   CO2 27 04/17/2022   TSH  1.84 04/17/2022   HGBA1C 5.0 04/17/2022    No results found.  Assessment & Plan:   Problem List Items Addressed This Visit     Vitamin D deficiency - Primary    Recommend taking 1000 to 2000 Ius daily       Primary insomnia    Her psychiatrist is planning a reduction in alprzolam XR from 2 mg to 1 mg with use of gabapentin       PCOS (polycystic ovarian syndrome)    With insulin resistance and obesity  prior treatment failure with metformin XR due to persistent diarrhea. PA for ozempic in progress . Counselling on diet given.  Exercising 5 days per week with a 3- mintue brisk walks      Paroxysmal SVT (supraventricular tachycardia)    Trial of propranolol for prn use.       Relevant Medications   propranolol (INDERAL) 10 MG tablet   History of CVA (cerebrovascular accident) without residual deficits    A home sleep study was attempted but did not record.  She declines further testing ,       Cancer of right female breast [facility])    S/p bilateral mastectomy by Dr Marlou Starks. . She has completed cheomotherapy and has no evidence of recurrence or metastasis         I spent a total of  40 minutes with this patient in a face to face visit on the date of this encounter reviewing the last office visit with me, her   most recent visit with oncology  ,  patient's diet and exercise habits,  and post visit ordering of testing and therapeutics.    Follow-up: Return in about 3 months (around 12/02/2022).   Crecencio Mc, MD

## 2022-09-01 NOTE — Assessment & Plan Note (Signed)
A home sleep study was attempted but did not record.  She declines further testing ,

## 2022-09-03 ENCOUNTER — Telehealth: Payer: Self-pay | Admitting: *Deleted

## 2022-09-03 NOTE — Telephone Encounter (Signed)
PA for Ozempic has been denied.

## 2022-09-03 NOTE — Telephone Encounter (Signed)
We have have the starting dose pens of Ozempic.

## 2022-09-03 NOTE — Telephone Encounter (Signed)
PA for Ozempic denied

## 2022-09-04 ENCOUNTER — Encounter: Payer: Self-pay | Admitting: Internal Medicine

## 2022-09-07 DIAGNOSIS — G4700 Insomnia, unspecified: Secondary | ICD-10-CM | POA: Diagnosis not present

## 2022-09-07 DIAGNOSIS — F1320 Sedative, hypnotic or anxiolytic dependence, uncomplicated: Secondary | ICD-10-CM | POA: Diagnosis not present

## 2022-09-07 DIAGNOSIS — F419 Anxiety disorder, unspecified: Secondary | ICD-10-CM | POA: Diagnosis not present

## 2022-09-08 NOTE — Telephone Encounter (Signed)
[facility] message has been sent to pt.

## 2022-09-10 DIAGNOSIS — C50411 Malignant neoplasm of upper-outer quadrant of right female breast: Secondary | ICD-10-CM | POA: Diagnosis not present

## 2022-09-10 DIAGNOSIS — Z171 Estrogen receptor negative status [ER-]: Secondary | ICD-10-CM | POA: Diagnosis not present

## 2022-09-10 NOTE — Telephone Encounter (Signed)
Medication Samples have been provided to the patient.  Drug name: Ozempic       Strength: 0.25 mg        Qty: 1 box  LOT: [id number]  Exp.Date: 04/24/2024  Dosing instructions: Inject 0.25 mg into skin once weekly.   The patient has been instructed regarding the correct time, dose, and frequency of taking this medication, including desired effects and most common side effects.   [name] 11:19 AM 09/10/2022

## 2022-09-14 ENCOUNTER — Ambulatory Visit: Payer: BC Managed Care – PPO

## 2022-09-14 NOTE — Progress Notes (Signed)
Pt presented to get help/ demonstration on using the Ozermpic pen.  Pt is on the 0.25 mg dose, I educated her on the steps on attaching the needle and administering the dose.   After demonstrating pt was able to give herself the first dose in office and she left with no questions or concerns.   Medication Samples have been provided to the patient.  Drug name: Ozempic       Strength: 0.25        Qty: 1 pen  LOT: [id number]  Exp.Date: 04-24-2024  Dosing instructions: inject 0.'25mg'$  into skin weekly  The patient has been instructed regarding the correct time, dose, and frequency of taking this medication, including desired effects and most common side effects.   Gracy Racer 2:09 PM 09/14/2022

## 2022-09-28 DIAGNOSIS — G4700 Insomnia, unspecified: Secondary | ICD-10-CM | POA: Diagnosis not present

## 2022-09-28 DIAGNOSIS — F1320 Sedative, hypnotic or anxiolytic dependence, uncomplicated: Secondary | ICD-10-CM | POA: Diagnosis not present

## 2022-09-28 DIAGNOSIS — F900 Attention-deficit hyperactivity disorder, predominantly inattentive type: Secondary | ICD-10-CM | POA: Diagnosis not present

## 2022-09-29 LAB — SIGNATERA ONLY (NATERA MANAGED)
SIGNATERA MTM READOUT: 0 MTM/ml
SIGNATERA TEST RESULT: NEGATIVE

## 2022-10-01 NOTE — Telephone Encounter (Signed)
Called pt per MD to advise [employer] testing was negative/not detected. Pt verbalized understanding of results and knows [employer] will be in touch to schedule 3 mo repeat lab.   

## 2022-10-05 ENCOUNTER — Encounter: Payer: Self-pay | Admitting: Hematology and Oncology

## 2022-10-06 ENCOUNTER — Encounter (HOSPITAL_COMMUNITY): Payer: Self-pay

## 2022-10-08 ENCOUNTER — Other Ambulatory Visit: Payer: Self-pay | Admitting: Internal Medicine

## 2022-10-08 ENCOUNTER — Encounter: Payer: Self-pay | Admitting: Hematology and Oncology

## 2022-10-13 ENCOUNTER — Ambulatory Visit: Payer: BC Managed Care – PPO | Attending: General Surgery

## 2022-10-13 VITALS — Wt 186.1 lb

## 2022-10-13 DIAGNOSIS — C50411 Malignant neoplasm of upper-outer quadrant of right female breast: Secondary | ICD-10-CM | POA: Diagnosis not present

## 2022-10-13 DIAGNOSIS — Z483 Aftercare following surgery for neoplasm: Secondary | ICD-10-CM | POA: Insufficient documentation

## 2022-10-13 DIAGNOSIS — Z170 Estrogen receptor positive status [ER+]: Secondary | ICD-10-CM | POA: Diagnosis not present

## 2022-10-13 NOTE — Therapy (Signed)
OUTPATIENT PHYSICAL THERAPY SOZO SCREENING NOTE   Patient Name: Katelyn Hobbs MRN: [id number] DOB:February 17, 1976, 46 y.o., female Today's Date: 10/13/2022  PCP: Crecencio Mc, MD REFERRING PROVIDER: Jovita Kussmaul, MD   PT End of Session - 10/13/22 1205     Visit Number 3   # unchanged due to screen only   PT Start Time 1203    PT Stop Time 1207    PT Time Calculation (min) 4 min    Activity Tolerance Patient tolerated treatment well    Behavior During Therapy [facility] for tasks assessed/performed             Past Medical History:  Diagnosis Date   Abnormal uterine bleeding    ADHD (attention deficit hyperactivity disorder)    Anemia    Anxiety    Bacterial vaginosis    Cancer ([place]) 06/2021   breast cancer   Complication of anesthesia    "woke up with high heart rate" thinks it was phenergan   Depression    Endometriosis    Family history of breast cancer 07/17/2021   Family history of melanoma 07/17/2021   Family history of prostate cancer 07/17/2021   Family history of uterine cancer 07/17/2021   Hyperlipidemia    Migraine    PCOS (polycystic ovarian syndrome)    PONV (postoperative nausea and vomiting)    Stroke [facility])    "old stroke" per MRI   Past Surgical History:  Procedure Laterality Date   IR IMAGING GUIDED PORT INSERTION  09/09/2021   LAPAROSCOPY  1993   MASS EXCISION Bilateral 03/09/2022   Procedure: BILATERAL EXCISION OF EXCESS TISSUE CHEST WALL;  Surgeon: Jovita Kussmaul, MD;  Location: [place];  Service: General;  Laterality: Bilateral;   MASTECTOMY W/ SENTINEL NODE BIOPSY Right 08/18/2021   Procedure: RIGHT MASTECTOMY WITH RIGHT SENTINEL LYMPH NODE BIOPSY;  Surgeon: Jovita Kussmaul, MD;  Location: [place];  Service: General;  Laterality: Right;   PORT-A-CATH REMOVAL Right 03/09/2022   Procedure: REMOVAL PORT-A-CATH;  Surgeon: Jovita Kussmaul, MD;  Location: [place];  Service: General;  Laterality: Right;    SALPINGECTOMY Bilateral 2015   TOTAL MASTECTOMY Left 08/18/2021   Procedure: LEFT TOTAL MASTECTOMY;  Surgeon: Jovita Kussmaul, MD;  Location: [place];  Service: General;  Laterality: Left;   VAGINAL HYSTERECTOMY  2017   Patient Active Problem List   Diagnosis Date Noted   B12 deficiency 04/17/2022   History of CVA (cerebrovascular accident) without residual deficits 02/16/2022   Cancer of right female breast ([place]) 08/18/2021   Family history of uterine cancer 07/17/2021   Family history of breast cancer 07/17/2021   Family history of prostate cancer 07/17/2021   Family history of melanoma 07/17/2021   Genetic testing 07/17/2021   Malignant neoplasm of upper-outer quadrant of right breast in female, estrogen receptor negative ([place]) 07/14/2021   Breast calcifications on mammogram 03/16/2021   Encounter for preventive health examination 01/08/2021   Primary insomnia 09/24/2020   PCOS (polycystic ovarian syndrome) 04/16/2020   Paroxysmal SVT (supraventricular tachycardia) 04/16/2020   Screen for STD (sexually transmitted disease) 04/16/2020   S/P total hysterectomy 04/16/2020   Vitamin D deficiency 09/21/2015   Obesity (BMI 30.0-34.9) 09/17/2015    REFERRING DIAG: right breast cancer at risk for lymphedema  THERAPY DIAG: Aftercare following surgery for neoplasm  PERTINENT HISTORY: Rt mastectomy 08/18/21 due to Grade 3 triple negative breast cancer. 0/3LN negative. Chemo completed  PRECAUTIONS: right UE Lymphedema risk,  SUBJECTIVE: Pt is here for her 3 month SOZO.   PAIN:  Are you having pain? No  SOZO SCREENING: Patient was assessed today using the SOZO machine to determine the lymphedema index score. This was compared to her baseline score. It was determined that she is within the recommended range when compared to her baseline and no further action is needed at this time. She will continue SOZO screenings. These are done every 3 months for 2 years post operatively followed by  every 6 months for 2 years, and then annually.   L-DEX FLOWSHEETS - 10/13/22 1200       L-DEX LYMPHEDEMA SCREENING   Measurement Type Unilateral    L-DEX MEASUREMENT EXTREMITY Upper Extremity    POSITION  Standing    DOMINANT SIDE Right    At Risk Side Right    BASELINE SCORE (UNILATERAL) 1.1    L-DEX SCORE (UNILATERAL) 1.4    VALUE CHANGE (UNILAT) 0.3               Otelia Limes, PTA 10/13/2022, 12:07 PM

## 2022-11-02 DIAGNOSIS — G4700 Insomnia, unspecified: Secondary | ICD-10-CM | POA: Diagnosis not present

## 2022-11-02 DIAGNOSIS — F1320 Sedative, hypnotic or anxiolytic dependence, uncomplicated: Secondary | ICD-10-CM | POA: Diagnosis not present

## 2022-11-02 DIAGNOSIS — F900 Attention-deficit hyperactivity disorder, predominantly inattentive type: Secondary | ICD-10-CM | POA: Diagnosis not present

## 2022-11-15 ENCOUNTER — Other Ambulatory Visit: Payer: Self-pay | Admitting: Internal Medicine

## 2022-11-17 ENCOUNTER — Encounter: Payer: Self-pay | Admitting: Internal Medicine

## 2022-11-17 DIAGNOSIS — Z0184 Encounter for antibody response examination: Secondary | ICD-10-CM

## 2022-11-20 ENCOUNTER — Telehealth: Payer: Self-pay | Admitting: *Deleted

## 2022-11-20 NOTE — Telephone Encounter (Signed)
Received call from pt with complaint of swelling and redness under right breast x several days.  Pt denies recent injury or trauma and requesting office visit for breast exam.  [facility] visit scheduled, pt verbalized understanding of appt date and time.

## 2022-11-23 ENCOUNTER — Telehealth: Payer: Self-pay

## 2022-11-23 ENCOUNTER — Other Ambulatory Visit: Payer: Self-pay

## 2022-11-23 ENCOUNTER — Inpatient Hospital Stay: Payer: BC Managed Care – PPO | Attending: Adult Health | Admitting: Adult Health

## 2022-11-23 ENCOUNTER — Encounter: Payer: Self-pay | Admitting: Adult Health

## 2022-11-23 VITALS — BP 127/75 | HR 93 | Temp 97.9°F | Resp 16 | Ht 64.0 in | Wt 182.8 lb

## 2022-11-23 DIAGNOSIS — Z808 Family history of malignant neoplasm of other organs or systems: Secondary | ICD-10-CM | POA: Diagnosis not present

## 2022-11-23 DIAGNOSIS — R222 Localized swelling, mass and lump, trunk: Secondary | ICD-10-CM

## 2022-11-23 DIAGNOSIS — C50411 Malignant neoplasm of upper-outer quadrant of right female breast: Secondary | ICD-10-CM | POA: Diagnosis not present

## 2022-11-23 DIAGNOSIS — Z171 Estrogen receptor negative status [ER-]: Secondary | ICD-10-CM | POA: Insufficient documentation

## 2022-11-23 DIAGNOSIS — Z8049 Family history of malignant neoplasm of other genital organs: Secondary | ICD-10-CM | POA: Insufficient documentation

## 2022-11-23 DIAGNOSIS — Z87891 Personal history of nicotine dependence: Secondary | ICD-10-CM | POA: Diagnosis not present

## 2022-11-23 DIAGNOSIS — Z803 Family history of malignant neoplasm of breast: Secondary | ICD-10-CM | POA: Insufficient documentation

## 2022-11-23 DIAGNOSIS — Z90710 Acquired absence of both cervix and uterus: Secondary | ICD-10-CM | POA: Insufficient documentation

## 2022-11-23 DIAGNOSIS — Z8042 Family history of malignant neoplasm of prostate: Secondary | ICD-10-CM | POA: Insufficient documentation

## 2022-11-23 DIAGNOSIS — Z9013 Acquired absence of bilateral breasts and nipples: Secondary | ICD-10-CM | POA: Diagnosis not present

## 2022-11-23 NOTE — Assessment & Plan Note (Signed)
Katelyn Hobbs is a 47 year old woman with stage IIa triple negative breast cancer status post bilateral mastectomies and adjuvant chemotherapy that she completed in April 2023.  Katelyn Hobbs is doing well today.  For the chest wall erythema and slight swelling I have sent an order over for evaluation with ultrasound.  My nurse is working on getting this scheduled.  We discussed that if the area worsens to let me know because I can always send in antibiotics if it becomes clinically indicated.  We will f/u with patient based on the results.

## 2022-11-23 NOTE — Progress Notes (Signed)
[facility] up:    Katelyn Mc, MD [address] [place] [address]   DIAGNOSIS:  Cancer Staging  Malignant neoplasm of upper-outer quadrant of right breast in female, estrogen receptor negative ([place]) Staging form: Breast, AJCC 8th Edition - Clinical stage from 07/16/2021: Stage IIB (cT2, cN0, cM0, G3, ER+, PR-, HER2-) - Signed by Nicholas Lose, MD on 07/16/2021 Stage prefix: Initial diagnosis Histologic grading system: 3 grade system - Pathologic stage from 08/18/2021: Stage IIA (pT2, pN0, cM0, G3, ER-, PR-, HER2-) - Signed by Gardenia Phlegm, NP on 07/21/2022 Stage prefix: Initial diagnosis Histologic grading system: 3 grade system   SUMMARY OF ONCOLOGIC HISTORY: Oncology History  Malignant neoplasm of upper-outer quadrant of right breast in female, estrogen receptor negative ([place])  07/08/2021 Initial Diagnosis   Palpable right breast lump. Diagnostic mammogram: showed highly suspicious right breast mass and an indeterminate single right axillary lymph node with up to 5 mm diffuse cortical thickening. Biopsy: grade 2-3 invasive ductal carcinoma, DCIS, and right axillary lymph node negative for metastatic carcinoma; ER+(80%)/PR-/Her2-.   07/16/2021 Cancer Staging   Staging form: Breast, AJCC 8th Edition - Clinical stage from 07/16/2021: Stage IIB (cT2, cN0, cM0, G3, ER+, PR-, HER2-) - Signed by Nicholas Lose, MD on 07/16/2021 Stage prefix: Initial diagnosis Histologic grading system: 3 grade system   08/18/2021 Surgery   Right mastectomy: Grade 3 IDC 2.8 cm with high-grade DCIS, margins negative, 0/3 lymph nodes negative, ER 0%, PR 0%, HER2 negative, Ki-67 60% (triple negative)   08/18/2021 Cancer Staging   Staging form: Breast, AJCC 8th Edition - Pathologic stage from 08/18/2021: Stage IIA (pT2, pN0, cM0, G3, ER-, PR-, HER2-) - Signed by Gardenia Phlegm, NP on 07/21/2022 Stage prefix: Initial diagnosis Histologic  grading system: 3 grade system   09/22/2021 - 02/04/2022 Chemotherapy   Patient is on Treatment Plan : BREAST Dose Dense AC q14d / CARBOplatin D1 + PACLitaxel D1,8,15 q21d     10/06/2021 Genetic Testing   Negative hereditary cancer genetic testing: no pathogenic variants detected in [facility] CancerNext-Expanded +RNAinsight Panel.  The report date is 10/06/2021.   The CancerNext-Expanded gene panel offered by [facility] and includes sequencing, rearrangement, and RNA analysis for the following 77 genes: AIP, ALK, APC, ATM, AXIN2, BAP1, BARD1, BLM, BMPR1A, BRCA1, BRCA2, BRIP1, CDC73, CDH1, CDK4, CDKN1B, CDKN2A, CHEK2, CTNNA1, DICER1, FANCC, FH, FLCN, GALNT12, KIF1B, LZTR1, MAX, MEN1, MET, MLH1, MSH2, MSH3, MSH6, MUTYH, NBN, NF1, NF2, NTHL1, PALB2, PHOX2B, PMS2, POT1, PRKAR1A, PTCH1, PTEN, RAD51C, RAD51D, RB1, RECQL, RET, SDHA, SDHAF2, SDHB, SDHC, SDHD, SMAD4, SMARCA4, SMARCB1, SMARCE1, STK11, SUFU, TMEM127, TP53, TSC1, TSC2, VHL and XRCC2 (sequencing and deletion/duplication); EGFR, EGLN1, HOXB13, KIT, MITF, PDGFRA, POLD1, and POLE (sequencing only); EPCAM and GREM1 (deletion/duplication only).      CURRENT THERAPY: Observation  INTERVAL HISTORY: Katelyn Hobbs 47 y.o. female returns for follow-up and evaluation due to swelling and redness that she noticed under the right breast that began last week.  Katelyn Hobbs noticed that this had previously happened when she was receiving Taxol and it would wax and wane from time to time.  At a couple of intervals Dr. Marlou Starks evaluated her and the redness have progressed and she received a course of antibiotics.  She does feel like the chest wall is swollen slightly but denies any warmth or tenderness to the area.   Patient Active Problem List   Diagnosis Date Noted   B12 deficiency 04/17/2022   History of CVA (  cerebrovascular accident) without residual deficits 02/16/2022   Family history of uterine cancer 07/17/2021   Family history of breast cancer 07/17/2021    Family history of prostate cancer 07/17/2021   Family history of melanoma 07/17/2021   Genetic testing 07/17/2021   Malignant neoplasm of upper-outer quadrant of right breast in female, estrogen receptor negative ([place]) 07/14/2021   Breast calcifications on mammogram 03/16/2021   Encounter for preventive health examination 01/08/2021   Primary insomnia 09/24/2020   PCOS (polycystic ovarian syndrome) 04/16/2020   Paroxysmal SVT (supraventricular tachycardia) 04/16/2020   Screen for STD (sexually transmitted disease) 04/16/2020   S/P total hysterectomy 04/16/2020   Vitamin D deficiency 09/21/2015   Obesity (BMI 30.0-34.9) 09/17/2015    is allergic to benadryl [diphenhydramine], latex, and phenergan [promethazine hcl].  MEDICAL HISTORY: Past Medical History:  Diagnosis Date   Abnormal uterine bleeding    ADHD (attention deficit hyperactivity disorder)    Anemia    Anxiety    Bacterial vaginosis    Cancer ([place]) 06/2021   breast cancer   Complication of anesthesia    "woke up with high heart rate" thinks it was phenergan   Depression    Endometriosis    Family history of breast cancer 07/17/2021   Family history of melanoma 07/17/2021   Family history of prostate cancer 07/17/2021   Family history of uterine cancer 07/17/2021   Hyperlipidemia    Migraine    PCOS (polycystic ovarian syndrome)    PONV (postoperative nausea and vomiting)    Stroke [facility])    "old stroke" per MRI    SURGICAL HISTORY: Past Surgical History:  Procedure Laterality Date   IR IMAGING GUIDED PORT INSERTION  09/09/2021   LAPAROSCOPY  1993   MASS EXCISION Bilateral 03/09/2022   Procedure: BILATERAL EXCISION OF EXCESS TISSUE CHEST WALL;  Surgeon: Jovita Kussmaul, MD;  Location: [place];  Service: General;  Laterality: Bilateral;   MASTECTOMY W/ SENTINEL NODE BIOPSY Right 08/18/2021   Procedure: RIGHT MASTECTOMY WITH RIGHT SENTINEL LYMPH NODE BIOPSY;  Surgeon: Jovita Kussmaul, MD;   Location: [place];  Service: General;  Laterality: Right;   PORT-A-CATH REMOVAL Right 03/09/2022   Procedure: REMOVAL PORT-A-CATH;  Surgeon: Jovita Kussmaul, MD;  Location: [place];  Service: General;  Laterality: Right;   SALPINGECTOMY Bilateral 2015   TOTAL MASTECTOMY Left 08/18/2021   Procedure: LEFT TOTAL MASTECTOMY;  Surgeon: Jovita Kussmaul, MD;  Location: [place];  Service: General;  Laterality: Left;   VAGINAL HYSTERECTOMY  2017    SOCIAL HISTORY: Social History   Socioeconomic History   Marital status: Divorced    Spouse name: Not on file   Number of children: 2   Years of education: Not on file   Highest education level: Not on file  Occupational History   Occupation: RN  Tobacco Use   Smoking status: Former    Types: Cigarettes    Quit date: 2018    Years since quitting: 6.0   Smokeless tobacco: Never  Vaping Use   Vaping Use: Former  Substance and Sexual Activity   Alcohol use: Yes    Comment: occasionally   Drug use: No   Sexual activity: Yes    Birth control/protection: Surgical  Other Topics Concern   Not on file  Social History Narrative   2 adopted children: 47 yo son, 40 yo daughter,    Social Determinants of Radio broadcast assistant Strain: Not on file  Food Insecurity:  Not on file  Transportation Needs: Not on file  Physical Activity: Not on file  Stress: Not on file  Social Connections: Not on file  Intimate Partner Violence: Not on file    FAMILY HISTORY: Family History  Problem Relation Age of Onset   Uterine cancer Mother 15   Diabetes Father    Hyperlipidemia Father    Hypertension Father    Melanoma Sister 76       T1a; on back   ADD / ADHD Sister    Diabetes Sister    Hypertension Sister    Heart disease Maternal Grandmother    Bipolar disorder Maternal Grandfather    Heart disease Paternal Grandmother    Prostate cancer Paternal Grandfather        dx after 18   Diabetes Paternal Grandfather    Breast cancer  Other 16       MGM's mother   Colon cancer Neg Hx    Colon polyps Neg Hx    Esophageal cancer Neg Hx    Stomach cancer Neg Hx    Rectal cancer Neg Hx     Review of Systems  Constitutional:  Negative for appetite change, chills, fatigue, fever and unexpected weight change.  HENT:   Negative for hearing loss, lump/mass and trouble swallowing.   Eyes:  Negative for eye problems and icterus.  Respiratory:  Negative for chest tightness, cough and shortness of breath.   Cardiovascular:  Negative for chest pain, leg swelling and palpitations.  Gastrointestinal:  Negative for abdominal distention, abdominal pain, constipation, diarrhea, nausea and vomiting.  Endocrine: Negative for hot flashes.  Genitourinary:  Negative for difficulty urinating.   Musculoskeletal:  Negative for arthralgias.  Skin:  Negative for itching and rash.  Neurological:  Negative for dizziness, extremity weakness, headaches and numbness.  Hematological:  Negative for adenopathy. Does not bruise/bleed easily.  Psychiatric/Behavioral:  Negative for depression. The patient is not nervous/anxious.       PHYSICAL EXAMINATION  ECOG PERFORMANCE STATUS: 1 - Symptomatic but completely ambulatory  Vitals:   11/23/22 1435  BP: 127/75  Pulse: 93  Resp: 16  Temp: 97.9 F (36.6 C)  SpO2: 99%    Physical Exam Constitutional:      General: She is not in acute distress.    Appearance: Normal appearance. She is not toxic-appearing.  HENT:     Head: Normocephalic and atraumatic.  Eyes:     General: No scleral icterus. Cardiovascular:     Rate and Rhythm: Normal rate and regular rhythm.     Pulses: Normal pulses.     Heart sounds: Normal heart sounds.  Pulmonary:     Effort: Pulmonary effort is normal.     Breath sounds: Normal breath sounds.  Chest:     Comments: Status post bilateral mastectomy, just below the right mastectomy scar there is an area of tissue swelling and erythema there is no nodularity present,  no warmth, no tenderness. Abdominal:     General: Abdomen is flat. Bowel sounds are normal. There is no distension.     Palpations: Abdomen is soft.     Tenderness: There is no abdominal tenderness.  Musculoskeletal:        General: No swelling.     Cervical back: Neck supple.  Lymphadenopathy:     Cervical: No cervical adenopathy.  Skin:    General: Skin is warm and dry.     Findings: No rash.  Neurological:     General: No focal deficit present.  Mental Status: She is alert.  Psychiatric:        Mood and Affect: Mood normal.        Behavior: Behavior normal.     LABORATORY DATA:  None for this visit     ASSESSMENT and THERAPY PLAN:   Malignant neoplasm of upper-outer quadrant of right breast in female, estrogen receptor negative ([place]) Katelyn Hobbs is a 47 year old woman with stage IIa triple negative breast cancer status post bilateral mastectomies and adjuvant chemotherapy that she completed in April 2023.  Katelyn Hobbs is doing well today.  For the chest wall erythema and slight swelling I have sent an order over for evaluation with ultrasound.  My nurse is working on getting this scheduled.  We discussed that if the area worsens to let me know because I can always send in antibiotics if it becomes clinically indicated.  We will f/u with patient based on the results.      All questions were answered. The patient knows to call the clinic with any problems, questions or concerns. We can certainly see the patient much sooner if necessary.  Total encounter time:20 minutes*in face-to-face visit time, chart review, lab review, care coordination, order entry, and documentation of the encounter time.  Wilber Bihari, NP 11/23/22 3:07 PM Medical Oncology and Hematology [facility], [place] [address] Tel. [phone]    Fax. [phone]  *Total Encounter Time as defined by the Centers for Medicare and Medicaid Services includes, in addition to  the face-to-face time of a patient visit (documented in the note above) non-face-to-face time: obtaining and reviewing outside history, ordering and reviewing medications, tests or procedures, care coordination (communications with other health care professionals or caregivers) and documentation in the medical record.

## 2022-11-23 NOTE — Telephone Encounter (Signed)
Called Pt regarding [place] appt scheduled for 1/30 at 1310. Pt verbalized understanding.

## 2022-11-24 ENCOUNTER — Ambulatory Visit
Admission: RE | Admit: 2022-11-24 | Discharge: 2022-11-24 | Disposition: A | Discharge status: Home / Self Care | Payer: BC Managed Care – PPO | Source: Ambulatory Visit | Attending: Adult Health | Admitting: Adult Health

## 2022-11-24 DIAGNOSIS — Z853 Personal history of malignant neoplasm of breast: Secondary | ICD-10-CM | POA: Diagnosis not present

## 2022-11-24 DIAGNOSIS — Z171 Estrogen receptor negative status [ER-]: Secondary | ICD-10-CM

## 2022-11-24 DIAGNOSIS — R222 Localized swelling, mass and lump, trunk: Secondary | ICD-10-CM

## 2022-11-25 DIAGNOSIS — L249 Irritant contact dermatitis, unspecified cause: Secondary | ICD-10-CM | POA: Diagnosis not present

## 2022-11-25 DIAGNOSIS — Z111 Encounter for screening for respiratory tuberculosis: Secondary | ICD-10-CM | POA: Diagnosis not present

## 2022-11-30 ENCOUNTER — Ambulatory Visit: Payer: BC Managed Care – PPO | Admitting: Internal Medicine

## 2022-11-30 DIAGNOSIS — F1320 Sedative, hypnotic or anxiolytic dependence, uncomplicated: Secondary | ICD-10-CM | POA: Diagnosis not present

## 2022-11-30 DIAGNOSIS — F419 Anxiety disorder, unspecified: Secondary | ICD-10-CM | POA: Diagnosis not present

## 2022-11-30 DIAGNOSIS — G479 Sleep disorder, unspecified: Secondary | ICD-10-CM | POA: Diagnosis not present

## 2022-11-30 DIAGNOSIS — F900 Attention-deficit hyperactivity disorder, predominantly inattentive type: Secondary | ICD-10-CM | POA: Diagnosis not present

## 2022-12-07 ENCOUNTER — Ambulatory Visit: Payer: BC Managed Care – PPO | Admitting: Internal Medicine

## 2022-12-19 ENCOUNTER — Other Ambulatory Visit: Payer: Self-pay | Admitting: Internal Medicine

## 2022-12-22 ENCOUNTER — Encounter: Payer: Self-pay | Admitting: *Deleted

## 2022-12-22 NOTE — Progress Notes (Signed)
Received message from [facility] team stating pt does not want to continue with [employer] testing at this time due to being under the care of a new provider.  [employer] testing canceled.

## 2022-12-29 DIAGNOSIS — F419 Anxiety disorder, unspecified: Secondary | ICD-10-CM | POA: Diagnosis not present

## 2022-12-29 DIAGNOSIS — G479 Sleep disorder, unspecified: Secondary | ICD-10-CM | POA: Diagnosis not present

## 2022-12-29 DIAGNOSIS — F900 Attention-deficit hyperactivity disorder, predominantly inattentive type: Secondary | ICD-10-CM | POA: Diagnosis not present

## 2022-12-29 DIAGNOSIS — F1320 Sedative, hypnotic or anxiolytic dependence, uncomplicated: Secondary | ICD-10-CM | POA: Diagnosis not present

## 2023-01-26 ENCOUNTER — Inpatient Hospital Stay: Payer: BLUE CROSS/BLUE SHIELD | Admitting: Hematology and Oncology

## 2023-01-26 ENCOUNTER — Encounter: Payer: Self-pay | Admitting: Internal Medicine

## 2023-01-26 ENCOUNTER — Ambulatory Visit: Payer: BLUE CROSS/BLUE SHIELD | Admitting: Internal Medicine

## 2023-01-26 ENCOUNTER — Ambulatory Visit: Payer: BLUE CROSS/BLUE SHIELD | Attending: Internal Medicine

## 2023-01-26 VITALS — BP 110/76 | HR 100 | Temp 97.9°F | Ht 64.0 in | Wt 181.0 lb

## 2023-01-26 DIAGNOSIS — F1320 Sedative, hypnotic or anxiolytic dependence, uncomplicated: Secondary | ICD-10-CM | POA: Diagnosis not present

## 2023-01-26 DIAGNOSIS — I471 Supraventricular tachycardia, unspecified: Secondary | ICD-10-CM | POA: Diagnosis not present

## 2023-01-26 DIAGNOSIS — F419 Anxiety disorder, unspecified: Secondary | ICD-10-CM | POA: Diagnosis not present

## 2023-01-26 DIAGNOSIS — Z8673 Personal history of transient ischemic attack (TIA), and cerebral infarction without residual deficits: Secondary | ICD-10-CM

## 2023-01-26 DIAGNOSIS — E559 Vitamin D deficiency, unspecified: Secondary | ICD-10-CM | POA: Diagnosis not present

## 2023-01-26 DIAGNOSIS — F5101 Primary insomnia: Secondary | ICD-10-CM

## 2023-01-26 DIAGNOSIS — F900 Attention-deficit hyperactivity disorder, predominantly inattentive type: Secondary | ICD-10-CM | POA: Diagnosis not present

## 2023-01-26 DIAGNOSIS — Z6379 Other stressful life events affecting family and household: Secondary | ICD-10-CM | POA: Diagnosis not present

## 2023-01-26 DIAGNOSIS — E669 Obesity, unspecified: Secondary | ICD-10-CM

## 2023-01-26 MED ORDER — PROPRANOLOL HCL 10 MG PO TABS: 10.0000 mg | ORAL_TABLET | Freq: Three times a day (TID) | ORAL | 1 refills | Status: AC

## 2023-01-26 NOTE — Assessment & Plan Note (Deleted)
07/08/2021: Palpable right breast lump. Diagnostic mammogram: showed highly suspicious right breast mass and an indeterminate single right axillary lymph node with up to 5 mm diffuse cortical thickening. Biopsy: grade 2-3 invasive ductal carcinoma, DCIS, and right axillary lymph node negative for metastatic carcinoma; ER+(80%)/PR-/Her2-.   On the final pathology her estrogen receptor is negative and therefore patient has triple negative breast cancer   Recommendations: 1.  Bilateral mastectomies: Right mastectomy 08/18/2021: Grade 3 IDC 2.8 cm with high-grade DCIS, margins negative, 0/3 lymph nodes negative, ER 0%, PR 0%, HER2 negative, Ki-67 40% (final pathology is triple negative disease) 2. adjuvant chemotherapy with dose dense Adriamycin and Cytoxan followed by Taxol and carboplatin completed 02/04/2022 3. Genetic testing: No mutations -------------------------------------------------------------------------------------------------- Breast cancer surveillance: Chest wall examination no palpable lumps, nodules.  No role of imaging study since she had bilateral mastectomies. Chest wall erythema: Ultrasound negative Return to clinic in 1 year for follow-up

## 2023-01-26 NOTE — Progress Notes (Signed)
Subjective:  Patient ID: Katelyn Hobbs, female    DOB: 12-12-1975  Age: 47 y.o. MRN: [id number]  CC: The primary encounter diagnosis was SVT (supraventricular tachycardia). Diagnoses of Vitamin D deficiency, Primary insomnia, Paroxysmal SVT (supraventricular tachycardia), History of CVA (cerebrovascular accident) without residual deficits, and Obesity (BMI 30.0-34.9) were also pertinent to this visit.   HPI MAKHARI DESPIRITO presents for  Chief Complaint  Patient presents with   Medical Management of Chronic Issues    Anxiety/insomnia.  Her psychiatrist has been gradually Weaning from 2 mg alprazolam  dose and she is currently taking  0.5 alternating with 1.0 mg every 3rd day  Obesity:  lost 13 lbs with ozempic before she had to stop due to insurance non coverage.  Walking longer distances  dailyat her new [employer] job.. she has only gained  Gained 2 lbs since stopping. Ozempic   Samples given today  In January she developed an enflamed area of skin at the inferior border of her right chest wall.  Ultrasound was nondiagnostic,  skin biopsy done,  Hypersensitivity reaction by biopsy,  using zyrtec   SVT : now using propranolol 10 mg every afternoon on the days she takes concerta .  Never saw cardiology.  Zio monitor discussed  EKG today   Breast exam today requested   Outpatient Medications Prior to Visit  Medication Sig Dispense Refill   acetaminophen (TYLENOL) 500 MG tablet Take 1,000 mg by mouth every 8 (eight) hours as needed for moderate pain.     ALPRAZolam (XANAX XR) 0.5 MG 24 hr tablet Take 0.5-1 mg by mouth at bedtime.     bimatoprost (LATISSE) 0.03 % ophthalmic solution Place 1 application. into both eyes at bedtime. Apply evenly along the skin of the upper eyelid at base of eyelashes once daily at bedtime; repeat procedure for second eye (use a clean applicator). 3 mL 12   Cholecalciferol (VITAMIN D3) 125 MCG (5000 UT) CAPS Take 1 capsule by mouth daily.     citalopram  (CELEXA) 40 MG tablet Take 1 tablet (40 mg total) by mouth daily. 90 tablet 1   gabapentin (NEURONTIN) 100 MG capsule Take 100 mg by mouth 3 (three) times daily.     ibuprofen (ADVIL) 200 MG tablet Take 800 mg by mouth every 8 (eight) hours as needed for moderate pain.     methylphenidate 36 MG PO CR tablet Take 36 mg by mouth every morning.     Multiple Vitamins-Minerals (MULTIVITAMIN WITH MINERALS) tablet Take 1 tablet by mouth daily.     propranolol (INDERAL) 10 MG tablet TAKE 1 TABLET (10 MG TOTAL) BY MOUTH 3 (THREE) TIMES DAILY. AS NEEDED FOR RAPID HEART RATE 30 tablet 0   ALPRAZolam (XANAX XR) 2 MG 24 hr tablet TAKE 1 TABLET BY MOUTH EVERYDAY AT BEDTIME (Patient not taking: Reported on 01/26/2023) 30 tablet 2   methylphenidate 18 MG PO CR tablet Take 18 mg by mouth every morning. (Patient not taking: Reported on 01/26/2023)     No facility-administered medications prior to visit.    Review of Systems;  Patient denies headache, fevers, malaise, unintentional weight loss, skin rash, eye pain, sinus congestion and sinus pain, sore throat, dysphagia,  hemoptysis , cough, dyspnea, wheezing, chest pain, palpitations, orthopnea, edema, abdominal pain, nausea, melena, diarrhea, constipation, flank pain, dysuria, hematuria, urinary  Frequency, nocturia, numbness, tingling, seizures,  Focal weakness, Loss of consciousness,  Tremor, insomnia, depression, anxiety, and suicidal ideation.      Objective:  BP  110/76   Pulse 100   Temp 97.9 F (36.6 C) (Oral)   Ht 5\' 4"  (1.626 m)   Wt 181 lb (82.1 kg)   SpO2 95%   BMI 31.07 kg/m   BP Readings from Last 3 Encounters:  01/26/23 110/76  11/23/22 127/75  09/01/22 110/80    Wt Readings from Last 3 Encounters:  01/26/23 181 lb (82.1 kg)  11/23/22 182 lb 12.8 oz (82.9 kg)  10/13/22 186 lb 2 oz (84.4 kg)    Physical Exam Vitals reviewed.  Constitutional:      General: She is not in acute distress.    Appearance: Normal appearance. She is  well-developed and normal weight. She is not ill-appearing, toxic-appearing or diaphoretic.  HENT:     Head: Normocephalic.     Right Ear: Tympanic membrane, ear canal and external ear normal. There is no impacted cerumen.     Left Ear: Tympanic membrane, ear canal and external ear normal. There is no impacted cerumen.     Nose: Nose normal.     Mouth/Throat:     Mouth: Mucous membranes are moist.     Pharynx: Oropharynx is clear.  Eyes:     General: No scleral icterus.       Right eye: No discharge.        Left eye: No discharge.     Conjunctiva/sclera: Conjunctivae normal.     Pupils: Pupils are equal, round, and reactive to light.  Neck:     Thyroid: No thyromegaly.     Vascular: No carotid bruit or JVD.  Cardiovascular:     Rate and Rhythm: Normal rate and regular rhythm.     Heart sounds: Normal heart sounds.  Pulmonary:     Effort: Pulmonary effort is normal. No respiratory distress.     Breath sounds: Normal breath sounds.  Chest:  Breasts:    Breasts are symmetrical.     Right: Absent. No swelling, inverted nipple, mass, nipple discharge, skin change or tenderness.     Left: Absent. No swelling, inverted nipple, mass, nipple discharge, skin change or tenderness.       Comments: Bilateral mastectomy scars Keloid formation over biopsy site  Abdominal:     General: Bowel sounds are normal.     Palpations: Abdomen is soft. There is no mass.     Tenderness: There is no abdominal tenderness. There is no guarding or rebound.  Musculoskeletal:        General: Normal range of motion.     Cervical back: Normal range of motion and neck supple.  Lymphadenopathy:     Cervical: No cervical adenopathy.     Upper Body:     Right upper body: No axillary or pectoral adenopathy.     Left upper body: No axillary or pectoral adenopathy.  Skin:    General: Skin is warm and dry.  Neurological:     General: No focal deficit present.     Mental Status: She is alert and oriented to  person, place, and time. Mental status is at baseline.  Psychiatric:        Mood and Affect: Mood normal.        Behavior: Behavior normal.        Thought Content: Thought content normal.        Judgment: Judgment normal.    Lab Results  Component Value Date   HGBA1C 5.0 04/17/2022   HGBA1C 5.1 05/17/2018   HGBA1C 5.3 02/18/2016    Lab Results  Component Value Date   CREATININE 0.66 04/17/2022   CREATININE 0.70 02/04/2022   CREATININE 0.57 01/27/2022    Lab Results  Component Value Date   WBC 6.5 07/21/2022   HGB 14.0 07/21/2022   HCT 40.2 07/21/2022   PLT 219 07/21/2022   GLUCOSE 100 (H) 04/17/2022   CHOL 234 (H) 04/17/2022   TRIG 186.0 (H) 04/17/2022   HDL 46.50 04/17/2022   LDLCALC 151 (H) 04/17/2022   ALT 30 04/17/2022   AST 21 04/17/2022   NA 140 04/17/2022   K 4.0 04/17/2022   CL 104 04/17/2022   CREATININE 0.66 04/17/2022   BUN 11 04/17/2022   CO2 27 04/17/2022   TSH 1.84 04/17/2022   HGBA1C 5.0 04/17/2022    US BREAST LTD UNI RIGHT INC AXILLA  Result Date: 11/24/2022 CLINICAL DATA:  47 year old female with fullness and erythema in the LOWER RIGHT breast/chest and fullness in the RIGHT axilla. History of RIGHT breast cancer and bilateral mastectomies in 2022. EXAM: ULTRASOUND OF THE RIGHT BREAST COMPARISON:  Previous exam(s). FINDINGS: On physical exam, very mild erythema below the RIGHT mastectomy scar noted. No discrete palpable masses in the RIGHT breast/chest or RIGHT axillary region noted. Targeted ultrasound is performed, showing no sonographic abnormalities in the LOWER RIGHT breast/chest below the mastectomy scar. No abnormal RIGHT axillary lymph nodes are noted. IMPRESSION: 1. No sonographic abnormalities within the LOWER RIGHT breast/chest or RIGHT axilla. RECOMMENDATION: Recommend clinical follow-up as indicated. Any further workup should be based on clinical grounds. If erythema persists or worsens, skin punch biopsy may be indicated. I have discussed  the findings and recommendations with the patient. If applicable, a reminder letter will be sent to the patient regarding the next appointment. BI-RADS CATEGORY  1: Negative. Electronically Signed   By: Margarette Canada M.D.   On: 11/24/2022 14:04   Assessment & Plan:  .SVT (supraventricular tachycardia) -     EKG 12-Lead -     Comprehensive metabolic panel -     Magnesium -     TSH -     Lipid panel -     LONG TERM MONITOR (3-14 DAYS); Future  Vitamin D deficiency -     VITAMIN D 25 Hydroxy (Vit-D Deficiency, Fractures)  Primary insomnia Assessment & Plan: Her psychiatrist is gradually weanig her  dose of alprazolam XR from 2 mg to currently 0.5 mg nightly with 1 mg every 3rd night .  Encouraged to consider adding Relaxium prn   Paroxysmal SVT (supraventricular tachycardia) Assessment & Plan: Attributed to use of Concerta.  Managed with prophylactic use of propranolol 10mg  each afternoon she takes Concerta  for ADD.   I have ordered and reviewed a 12 lead EKG and find that there are no acute changes and patient is in sinus rhythm.    ZIO monitor ordered.    History of CVA (cerebrovascular accident) without residual deficits Assessment & Plan: Incidental finding right cerebellum during ER CT for headache.  She declined workup    Obesity (BMI 30.0-34.9) Assessment & Plan: She was losing weight with ozempic until forced to stop it due to cost. Samples of Ozempic 0.25 mg weekly given for additional help in losing weight    Other orders -     Propranolol HCl; Take 1 tablet (10 mg total) by mouth 3 (three) times daily. As needed for rapid heart rate  Dispense: 270 tablet; Refill: 1     I provided 38 minutes on the day of this encounter reviewing patient's  last visit with Oncology,  patient's  recent surgical and non surgical procedures, previous  labs and imaging studies, counseling on currently addressed issues,  and post visit ordering to diagnostics and therapeutics .   Follow-up:  Return in about 6 months (around 07/28/2023).   Crecencio Mc, MD

## 2023-01-26 NOTE — Patient Instructions (Addendum)
Congrats on the weight loss and the alprazolam wean   You might want to try adding  Relaxium as you continue to wean if needed for insomnia  (as seen on TV commercials) . It is available through [employer] and contains all natural supplements:  Melatonin 5 mg  Chamomile 25 mg Passionflower extract 75 mg GABA 100 mg Ashwaganda extract 125 mg Magnesium citrate, glycinate, oxide (100 mg)  L tryptophan 500 mg Valerest (proprietary  ingredient ; probably valeria root extract)     I am ordering a 14 day monitor to make sure your runs of SVT are not anything  "else".  The ZIO monitor will be delivered to your home (boyfriend's home) and comes with instructions on how to apply

## 2023-01-26 NOTE — Assessment & Plan Note (Addendum)
She was losing weight with ozempic until forced to stop it due to cost. Samples of Ozempic 0.25 mg weekly given for additional help in losing weight

## 2023-01-26 NOTE — Assessment & Plan Note (Signed)
Her psychiatrist is gradually weanig her  dose of alprazolam XR from 2 mg to currently 0.5 mg nightly with 1 mg every 3rd night .  Encouraged to consider adding Relaxium prn

## 2023-01-26 NOTE — Assessment & Plan Note (Signed)
Attributed to use of Concerta.  Managed with prophylactic use of propranolol 10mg  each afternoon she takes Concerta  for ADD.   I have ordered and reviewed a 12 lead EKG and find that there are no acute changes and patient is in sinus rhythm.    ZIO monitor ordered.

## 2023-01-26 NOTE — Assessment & Plan Note (Signed)
Incidental finding right cerebellum during ER CT for headache.  She declined workup

## 2023-01-27 ENCOUNTER — Telehealth: Payer: Self-pay | Admitting: Hematology and Oncology

## 2023-01-27 ENCOUNTER — Encounter: Payer: Self-pay | Admitting: Internal Medicine

## 2023-01-27 ENCOUNTER — Telehealth: Payer: Self-pay

## 2023-01-27 ENCOUNTER — Encounter: Payer: Self-pay | Admitting: Hematology and Oncology

## 2023-01-27 DIAGNOSIS — F419 Anxiety disorder, unspecified: Secondary | ICD-10-CM | POA: Diagnosis not present

## 2023-01-27 DIAGNOSIS — F32A Depression, unspecified: Secondary | ICD-10-CM | POA: Diagnosis not present

## 2023-01-27 DIAGNOSIS — C50411 Malignant neoplasm of upper-outer quadrant of right female breast: Secondary | ICD-10-CM | POA: Diagnosis not present

## 2023-01-27 DIAGNOSIS — Z171 Estrogen receptor negative status [ER-]: Secondary | ICD-10-CM | POA: Diagnosis not present

## 2023-01-27 DIAGNOSIS — Z87891 Personal history of nicotine dependence: Secondary | ICD-10-CM | POA: Diagnosis not present

## 2023-01-27 DIAGNOSIS — Z9221 Personal history of antineoplastic chemotherapy: Secondary | ICD-10-CM | POA: Diagnosis not present

## 2023-01-27 DIAGNOSIS — Z9013 Acquired absence of bilateral breasts and nipples: Secondary | ICD-10-CM | POA: Diagnosis not present

## 2023-01-27 DIAGNOSIS — Z79899 Other long term (current) drug therapy: Secondary | ICD-10-CM | POA: Diagnosis not present

## 2023-01-27 DIAGNOSIS — C50919 Malignant neoplasm of unspecified site of unspecified female breast: Secondary | ICD-10-CM | POA: Diagnosis not present

## 2023-01-27 LAB — COMPREHENSIVE METABOLIC PANEL
ALT: 22 U/L (ref 0–35)
AST: 19 U/L (ref 0–37)
Albumin: 4.6 g/dL (ref 3.5–5.2)
Alkaline Phosphatase: 65 U/L (ref 39–117)
BUN: 16 mg/dL (ref 6–23)
CO2: 30 mEq/L (ref 19–32)
Calcium: 9.7 mg/dL (ref 8.4–10.5)
Chloride: 103 mEq/L (ref 96–112)
Creatinine, Ser: 0.72 mg/dL (ref 0.40–1.20)
GFR: 99 mL/min (ref >60.00)
Glucose, Bld: 95 mg/dL (ref 70–99)
Potassium: 3.9 mEq/L (ref 3.5–5.1)
Sodium: 139 mEq/L (ref 135–145)
Total Bilirubin: 0.4 mg/dL (ref 0.2–1.2)
Total Protein: 7.2 g/dL (ref 6.0–8.3)

## 2023-01-27 LAB — MAGNESIUM: Magnesium: 2 mg/dL (ref 1.5–2.5)

## 2023-01-27 LAB — LIPID PANEL
Cholesterol: 231 mg/dL — ABNORMAL HIGH (ref 0–200)
HDL: 58.2 mg/dL (ref >39.00)
LDL Cholesterol: 138 mg/dL — ABNORMAL HIGH (ref 0–99)
NonHDL: 173.24
Total CHOL/HDL Ratio: 4
Triglycerides: 174 mg/dL — ABNORMAL HIGH (ref 0.0–149.0)
VLDL: 34.8 mg/dL (ref 0.0–40.0)

## 2023-01-27 LAB — VITAMIN D 25 HYDROXY (VIT D DEFICIENCY, FRACTURES): VITD: 24.39 ng/mL — ABNORMAL LOW (ref 30.00–100.00)

## 2023-01-27 LAB — TSH: TSH: 1.87 u[IU]/mL (ref 0.35–5.50)

## 2023-01-27 NOTE — Telephone Encounter (Signed)
Cancelled appointment per scheduling message. Called patient to see if she would like to reschedule another appointment. No answer; therefore, a voicemail was left.

## 2023-01-27 NOTE — Telephone Encounter (Signed)
Medication Samples have been provided to the patient.  Drug name: Ozempic       Strength: 0.25mg /0.5mg         Qty: 1 box       LOT: [id number]  Exp.Date: 04/24/2024  Dosing instructions: Inject 0.25 mg into skin once weekly.  The patient has been instructed regarding the correct time, dose, and frequency of taking this medication, including desired effects and most common side effects.   Katelyn Hobbs 8:59 AM 01/27/2023

## 2023-01-29 DIAGNOSIS — I471 Supraventricular tachycardia, unspecified: Secondary | ICD-10-CM | POA: Diagnosis not present

## 2023-02-01 ENCOUNTER — Ambulatory Visit: Payer: BC Managed Care – PPO

## 2023-02-02 DIAGNOSIS — C50911 Malignant neoplasm of unspecified site of right female breast: Secondary | ICD-10-CM | POA: Diagnosis not present

## 2023-02-02 DIAGNOSIS — D0511 Intraductal carcinoma in situ of right breast: Secondary | ICD-10-CM | POA: Diagnosis not present

## 2023-02-02 DIAGNOSIS — N6019 Diffuse cystic mastopathy of unspecified breast: Secondary | ICD-10-CM | POA: Diagnosis not present

## 2023-02-02 DIAGNOSIS — C50919 Malignant neoplasm of unspecified site of unspecified female breast: Secondary | ICD-10-CM | POA: Diagnosis not present

## 2023-02-03 DIAGNOSIS — C50919 Malignant neoplasm of unspecified site of unspecified female breast: Secondary | ICD-10-CM | POA: Diagnosis not present

## 2023-02-03 DIAGNOSIS — C50911 Malignant neoplasm of unspecified site of right female breast: Secondary | ICD-10-CM | POA: Diagnosis not present

## 2023-02-03 DIAGNOSIS — C50411 Malignant neoplasm of upper-outer quadrant of right female breast: Secondary | ICD-10-CM | POA: Diagnosis not present

## 2023-02-03 DIAGNOSIS — D0511 Intraductal carcinoma in situ of right breast: Secondary | ICD-10-CM | POA: Diagnosis not present

## 2023-02-17 DIAGNOSIS — F900 Attention-deficit hyperactivity disorder, predominantly inattentive type: Secondary | ICD-10-CM | POA: Diagnosis not present

## 2023-02-17 DIAGNOSIS — F1320 Sedative, hypnotic or anxiolytic dependence, uncomplicated: Secondary | ICD-10-CM | POA: Diagnosis not present

## 2023-02-17 DIAGNOSIS — F419 Anxiety disorder, unspecified: Secondary | ICD-10-CM | POA: Diagnosis not present

## 2023-02-17 DIAGNOSIS — F411 Generalized anxiety disorder: Secondary | ICD-10-CM | POA: Diagnosis not present

## 2023-06-29 ENCOUNTER — Encounter: Payer: Self-pay | Admitting: Internal Medicine

## 2023-07-28 ENCOUNTER — Ambulatory Visit: Payer: BLUE CROSS/BLUE SHIELD | Admitting: Internal Medicine

## 2023-08-10 ENCOUNTER — Ambulatory Visit: Payer: BLUE CROSS/BLUE SHIELD | Admitting: Internal Medicine

## 2023-10-02 ENCOUNTER — Other Ambulatory Visit: Payer: Self-pay | Admitting: Internal Medicine

## 2023-10-30 ENCOUNTER — Other Ambulatory Visit: Payer: Self-pay | Admitting: Internal Medicine

## 2024-02-15 ENCOUNTER — Other Ambulatory Visit: Payer: Self-pay | Admitting: Internal Medicine

## 2024-04-18 ENCOUNTER — Encounter: Payer: Self-pay | Admitting: Gastroenterology

## 2024-05-26 NOTE — Procedures (Signed)
 SABRA
